# Patient Record
Sex: Male | Born: 1935 | Race: White | Hispanic: No | Marital: Married | Smoking: Former smoker
Health system: Southern US, Community
[De-identification: ages and names within clinical notes are randomized; demographics above are authoritative.]

## PROBLEM LIST (undated history)

## (undated) DIAGNOSIS — N401 Enlarged prostate with lower urinary tract symptoms: Secondary | ICD-10-CM

## (undated) DIAGNOSIS — L570 Actinic keratosis: Secondary | ICD-10-CM

## (undated) DIAGNOSIS — F329 Major depressive disorder, single episode, unspecified: Secondary | ICD-10-CM

## (undated) DIAGNOSIS — R7989 Other specified abnormal findings of blood chemistry: Secondary | ICD-10-CM

## (undated) DIAGNOSIS — I4949 Other premature depolarization: Secondary | ICD-10-CM

## (undated) DIAGNOSIS — F411 Generalized anxiety disorder: Secondary | ICD-10-CM

## (undated) DIAGNOSIS — M2550 Pain in unspecified joint: Secondary | ICD-10-CM

## (undated) DIAGNOSIS — G309 Alzheimer's disease, unspecified: Secondary | ICD-10-CM

## (undated) DIAGNOSIS — L8990 Pressure ulcer of unspecified site, unspecified stage: Secondary | ICD-10-CM

## (undated) DIAGNOSIS — I10 Essential (primary) hypertension: Secondary | ICD-10-CM

## (undated) DIAGNOSIS — Z87898 Personal history of other specified conditions: Secondary | ICD-10-CM

## (undated) DIAGNOSIS — N138 Other obstructive and reflux uropathy: Secondary | ICD-10-CM

## (undated) DIAGNOSIS — K602 Anal fissure, unspecified: Secondary | ICD-10-CM

## (undated) DIAGNOSIS — K648 Other hemorrhoids: Secondary | ICD-10-CM

## (undated) DIAGNOSIS — F0280 Dementia in other diseases classified elsewhere without behavioral disturbance: Secondary | ICD-10-CM

## (undated) DIAGNOSIS — M1990 Unspecified osteoarthritis, unspecified site: Secondary | ICD-10-CM

## (undated) DIAGNOSIS — F3289 Other specified depressive episodes: Secondary | ICD-10-CM

## (undated) DIAGNOSIS — K5730 Diverticulosis of large intestine without perforation or abscess without bleeding: Secondary | ICD-10-CM

## (undated) DIAGNOSIS — I2510 Atherosclerotic heart disease of native coronary artery without angina pectoris: Secondary | ICD-10-CM

## (undated) DIAGNOSIS — I679 Cerebrovascular disease, unspecified: Secondary | ICD-10-CM

## (undated) DIAGNOSIS — D126 Benign neoplasm of colon, unspecified: Secondary | ICD-10-CM

## (undated) DIAGNOSIS — K2100 Gastro-esophageal reflux disease with esophagitis, without bleeding: Secondary | ICD-10-CM

## (undated) DIAGNOSIS — E782 Mixed hyperlipidemia: Secondary | ICD-10-CM

## (undated) DIAGNOSIS — E559 Vitamin D deficiency, unspecified: Secondary | ICD-10-CM

## (undated) DIAGNOSIS — G4700 Insomnia, unspecified: Secondary | ICD-10-CM

## (undated) DIAGNOSIS — L408 Other psoriasis: Secondary | ICD-10-CM

## (undated) DIAGNOSIS — M25569 Pain in unspecified knee: Secondary | ICD-10-CM

## (undated) DIAGNOSIS — E039 Hypothyroidism, unspecified: Secondary | ICD-10-CM

## (undated) DIAGNOSIS — G609 Hereditary and idiopathic neuropathy, unspecified: Secondary | ICD-10-CM

## (undated) DIAGNOSIS — K644 Residual hemorrhoidal skin tags: Secondary | ICD-10-CM

## (undated) DIAGNOSIS — R002 Palpitations: Secondary | ICD-10-CM

## (undated) HISTORY — PX: RETINAL TEAR REPAIR CRYOTHERAPY: SHX5304

## (undated) HISTORY — DX: Other premature depolarization: I49.49

## (undated) HISTORY — DX: Generalized anxiety disorder: F41.1

## (undated) HISTORY — DX: Palpitations: R00.2

## (undated) HISTORY — DX: Insomnia, unspecified: G47.00

## (undated) HISTORY — DX: Gastro-esophageal reflux disease with esophagitis, without bleeding: K21.00

## (undated) HISTORY — DX: Personal history of other specified conditions: Z87.898

## (undated) HISTORY — DX: Diverticulosis of large intestine without perforation or abscess without bleeding: K57.30

## (undated) HISTORY — DX: Hypothyroidism, unspecified: E03.9

## (undated) HISTORY — DX: Cerebrovascular disease, unspecified: I67.9

## (undated) HISTORY — DX: Essential (primary) hypertension: I10

## (undated) HISTORY — DX: Other obstructive and reflux uropathy: N13.8

## (undated) HISTORY — DX: Residual hemorrhoidal skin tags: K64.4

## (undated) HISTORY — DX: Dementia in other diseases classified elsewhere, unspecified severity, without behavioral disturbance, psychotic disturbance, mood disturbance, and anxiety: F02.80

## (undated) HISTORY — DX: Gastro-esophageal reflux disease with esophagitis: K21.0

## (undated) HISTORY — DX: Unspecified osteoarthritis, unspecified site: M19.90

## (undated) HISTORY — DX: Other specified depressive episodes: F32.89

## (undated) HISTORY — PX: CERVICAL DISC SURGERY: SHX588

## (undated) HISTORY — DX: Benign prostatic hyperplasia with lower urinary tract symptoms: N40.1

## (undated) HISTORY — DX: Hereditary and idiopathic neuropathy, unspecified: G60.9

## (undated) HISTORY — DX: Vitamin D deficiency, unspecified: E55.9

## (undated) HISTORY — DX: Actinic keratosis: L57.0

## (undated) HISTORY — DX: Pressure ulcer of unspecified site, unspecified stage: L89.90

## (undated) HISTORY — DX: Mixed hyperlipidemia: E78.2

## (undated) HISTORY — DX: Anal fissure, unspecified: K60.2

## (undated) HISTORY — DX: Benign neoplasm of colon, unspecified: D12.6

## (undated) HISTORY — DX: Pain in unspecified joint: M25.50

## (undated) HISTORY — DX: Alzheimer's disease, unspecified: G30.9

## (undated) HISTORY — DX: Other specified abnormal findings of blood chemistry: R79.89

## (undated) HISTORY — DX: Major depressive disorder, single episode, unspecified: F32.9

## (undated) HISTORY — DX: Pain in unspecified knee: M25.569

## (undated) HISTORY — DX: Atherosclerotic heart disease of native coronary artery without angina pectoris: I25.10

## (undated) HISTORY — DX: Other hemorrhoids: K64.8

## (undated) HISTORY — DX: Other psoriasis: L40.8

---

## 1970-04-22 HISTORY — PX: OTHER SURGICAL HISTORY: SHX169

## 1997-11-01 ENCOUNTER — Other Ambulatory Visit: Admission: RE | Admit: 1997-11-01 | Discharge: 1997-11-01 | Payer: Self-pay | Admitting: Orthopedic Surgery

## 1999-10-07 ENCOUNTER — Ambulatory Visit (HOSPITAL_COMMUNITY): Admission: RE | Admit: 1999-10-07 | Discharge: 1999-10-07 | Payer: Self-pay | Admitting: Internal Medicine

## 1999-10-07 ENCOUNTER — Encounter: Payer: Self-pay | Admitting: Internal Medicine

## 2000-05-14 ENCOUNTER — Ambulatory Visit (HOSPITAL_COMMUNITY): Admission: RE | Admit: 2000-05-14 | Discharge: 2000-05-14 | Payer: Self-pay | Admitting: *Deleted

## 2000-08-21 ENCOUNTER — Ambulatory Visit (HOSPITAL_COMMUNITY): Admission: RE | Admit: 2000-08-21 | Discharge: 2000-08-21 | Payer: Self-pay | Admitting: Ophthalmology

## 2001-03-01 ENCOUNTER — Emergency Department (HOSPITAL_COMMUNITY): Admission: EM | Admit: 2001-03-01 | Discharge: 2001-03-01 | Payer: Self-pay | Admitting: Emergency Medicine

## 2005-01-09 ENCOUNTER — Ambulatory Visit: Payer: Self-pay | Admitting: Gastroenterology

## 2005-01-14 ENCOUNTER — Ambulatory Visit: Payer: Self-pay | Admitting: Gastroenterology

## 2005-01-14 ENCOUNTER — Encounter (INDEPENDENT_AMBULATORY_CARE_PROVIDER_SITE_OTHER): Payer: Self-pay | Admitting: *Deleted

## 2005-01-14 ENCOUNTER — Ambulatory Visit (HOSPITAL_COMMUNITY): Admission: RE | Admit: 2005-01-14 | Discharge: 2005-01-14 | Payer: Self-pay | Admitting: Gastroenterology

## 2005-05-02 ENCOUNTER — Ambulatory Visit (HOSPITAL_COMMUNITY): Admission: RE | Admit: 2005-05-02 | Discharge: 2005-05-02 | Payer: Self-pay | Admitting: Gastroenterology

## 2006-02-03 ENCOUNTER — Ambulatory Visit (HOSPITAL_COMMUNITY): Admission: RE | Admit: 2006-02-03 | Discharge: 2006-02-03 | Payer: Self-pay | Admitting: Internal Medicine

## 2006-05-14 ENCOUNTER — Ambulatory Visit (HOSPITAL_COMMUNITY): Admission: RE | Admit: 2006-05-14 | Discharge: 2006-05-14 | Payer: Self-pay | Admitting: Internal Medicine

## 2006-05-29 ENCOUNTER — Ambulatory Visit (HOSPITAL_COMMUNITY): Admission: RE | Admit: 2006-05-29 | Discharge: 2006-05-29 | Payer: Self-pay | Admitting: Gastroenterology

## 2006-07-18 ENCOUNTER — Inpatient Hospital Stay (HOSPITAL_COMMUNITY): Admission: RE | Admit: 2006-07-18 | Discharge: 2006-07-24 | Payer: Self-pay | Admitting: Orthopedic Surgery

## 2006-07-18 HISTORY — PX: KNEE SURGERY: SHX244

## 2006-07-21 ENCOUNTER — Ambulatory Visit: Payer: Self-pay | Admitting: Internal Medicine

## 2006-07-31 ENCOUNTER — Ambulatory Visit: Payer: Self-pay | Admitting: Pulmonary Disease

## 2006-07-31 ENCOUNTER — Ambulatory Visit: Payer: Self-pay | Admitting: Cardiovascular Disease

## 2006-07-31 LAB — CONVERTED CEMR LAB
BUN: 18 mg/dL (ref 6–23)
Basophils Relative: 0 % (ref 0.0–1.0)
CO2: 28 meq/L (ref 19–32)
Creatinine, Ser: 1.4 mg/dL (ref 0.4–1.5)
Eosinophils Relative: 1.9 % (ref 0.0–5.0)
GFR calc Af Amer: 65 mL/min
Glucose, Bld: 133 mg/dL — ABNORMAL HIGH (ref 70–99)
HCT: 31.7 % — ABNORMAL LOW (ref 39.0–52.0)
Hemoglobin: 11.1 g/dL — ABNORMAL LOW (ref 13.0–17.0)
INR: 1.3 (ref 0.9–2.0)
Lymphocytes Relative: 11.8 % — ABNORMAL LOW (ref 12.0–46.0)
Monocytes Absolute: 0.7 10*3/uL (ref 0.2–0.7)
Monocytes Relative: 6.3 % (ref 3.0–11.0)
Neutro Abs: 8.4 10*3/uL — ABNORMAL HIGH (ref 1.4–7.7)
Neutrophils Relative %: 80 % — ABNORMAL HIGH (ref 43.0–77.0)
Potassium: 3.5 meq/L (ref 3.5–5.1)
Prothrombin Time: 13.9 s (ref 10.0–14.0)
RDW: 12.4 % (ref 11.5–14.6)
WBC: 10.5 10*3/uL (ref 4.5–10.5)

## 2006-08-01 ENCOUNTER — Ambulatory Visit: Admission: RE | Admit: 2006-08-01 | Discharge: 2006-08-01 | Payer: Self-pay | Admitting: Pulmonary Disease

## 2006-08-01 ENCOUNTER — Ambulatory Visit: Payer: Self-pay | Admitting: Vascular Surgery

## 2006-08-01 ENCOUNTER — Encounter: Payer: Self-pay | Admitting: Vascular Surgery

## 2006-08-07 ENCOUNTER — Ambulatory Visit: Payer: Self-pay | Admitting: Cardiovascular Disease

## 2006-08-07 ENCOUNTER — Inpatient Hospital Stay (HOSPITAL_BASED_OUTPATIENT_CLINIC_OR_DEPARTMENT_OTHER): Admission: RE | Admit: 2006-08-07 | Discharge: 2006-08-07 | Payer: Self-pay | Admitting: Cardiovascular Disease

## 2006-08-14 ENCOUNTER — Ambulatory Visit: Payer: Self-pay | Admitting: Cardiovascular Disease

## 2006-08-27 ENCOUNTER — Ambulatory Visit: Payer: Self-pay | Admitting: Pulmonary Disease

## 2006-08-27 ENCOUNTER — Ambulatory Visit (HOSPITAL_COMMUNITY): Admission: RE | Admit: 2006-08-27 | Discharge: 2006-08-28 | Payer: Self-pay | Admitting: Orthopedic Surgery

## 2006-09-01 ENCOUNTER — Ambulatory Visit: Payer: Self-pay | Admitting: Pulmonary Disease

## 2007-02-24 ENCOUNTER — Ambulatory Visit: Payer: Self-pay | Admitting: Cardiovascular Disease

## 2007-05-25 ENCOUNTER — Ambulatory Visit: Payer: Self-pay | Admitting: Cardiovascular Disease

## 2007-12-29 ENCOUNTER — Ambulatory Visit: Payer: Self-pay | Admitting: Cardiovascular Disease

## 2007-12-29 ENCOUNTER — Ambulatory Visit: Payer: Self-pay

## 2008-06-22 DIAGNOSIS — N400 Enlarged prostate without lower urinary tract symptoms: Secondary | ICD-10-CM

## 2008-06-22 DIAGNOSIS — G309 Alzheimer's disease, unspecified: Secondary | ICD-10-CM

## 2008-06-22 DIAGNOSIS — F0280 Dementia in other diseases classified elsewhere without behavioral disturbance: Secondary | ICD-10-CM

## 2008-06-22 DIAGNOSIS — I2510 Atherosclerotic heart disease of native coronary artery without angina pectoris: Secondary | ICD-10-CM

## 2008-06-22 DIAGNOSIS — I10 Essential (primary) hypertension: Secondary | ICD-10-CM | POA: Insufficient documentation

## 2008-06-22 DIAGNOSIS — N138 Other obstructive and reflux uropathy: Secondary | ICD-10-CM

## 2008-06-22 HISTORY — DX: Benign prostatic hyperplasia with lower urinary tract symptoms: N13.8

## 2008-06-23 ENCOUNTER — Ambulatory Visit: Payer: Self-pay | Admitting: Cardiovascular Disease

## 2008-06-24 ENCOUNTER — Encounter: Admission: RE | Admit: 2008-06-24 | Discharge: 2008-06-24 | Payer: Self-pay | Admitting: Internal Medicine

## 2008-07-15 DIAGNOSIS — I25118 Atherosclerotic heart disease of native coronary artery with other forms of angina pectoris: Secondary | ICD-10-CM | POA: Insufficient documentation

## 2008-08-17 ENCOUNTER — Telehealth: Payer: Self-pay | Admitting: Cardiovascular Disease

## 2008-09-26 ENCOUNTER — Ambulatory Visit: Payer: Self-pay | Admitting: Cardiovascular Disease

## 2008-09-26 DIAGNOSIS — E782 Mixed hyperlipidemia: Secondary | ICD-10-CM

## 2008-11-08 LAB — HM COLONOSCOPY

## 2009-02-08 ENCOUNTER — Encounter (INDEPENDENT_AMBULATORY_CARE_PROVIDER_SITE_OTHER): Payer: Self-pay | Admitting: *Deleted

## 2009-03-31 ENCOUNTER — Ambulatory Visit: Payer: Self-pay | Admitting: Cardiovascular Disease

## 2010-04-02 ENCOUNTER — Encounter: Payer: Self-pay | Admitting: Cardiovascular Disease

## 2010-04-02 ENCOUNTER — Ambulatory Visit: Payer: Self-pay | Admitting: Cardiovascular Disease

## 2010-05-24 NOTE — Assessment & Plan Note (Signed)
Summary: yearly visit   CC:  sob..pt didnot bring med list.  History of Present Illness: Phillip Larson is seen today for followup of his blood pressure.  It seems to be better controlled on Hyzaar.  He has been more active.  He is watching the salt in his diet.  He also has hypercholesterolemia I dont' have recent labs on him which are usually checked by his primary.  He has CAD with a total RCA that is collaterized.  He had a low risk myovue in 2009 .  He is not having any chest pain PND or orthopnea.  He says Dr. Lenoria Larson. Phillip Larson is also notices blood pressure improved on Hyzaar. interestingly today his Alzheimer's disease also seems improved.  He was much more coherent and talkative.He is active and working out at [employer]  He denies SSCP, dyspnea, palpitaotns or syncope.  He sees Phillip Larson every 6 months.  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 Take 1 Tablet By Mouth Once A Day 2)  Flomax 0.4 Mg Xr24h-Cap (Tamsulosin Hcl) .[name].. 1 Tab By Mouth Once Daily 3)  Hydroxyzine Hcl 25 Mg Tabs (Hydroxyzine Hcl) .... 2 Tabs By Mouth Once Daily 4)  Levothyroxine Sodium 50 Mcg Tabs (Levothyroxine Sodium) .[name].. 1 Tab By Mouth Once Daily 5)  Lovastatin 40 Mg Tabs (Lovastatin) .... Take One Tablet By Mouth Daily At Bedtime 6)  Namenda 10 Mg Tabs (Memantine Hcl) .... 2 Tabs By Mouth Once Daily 7)  Razadyne Er 24 Mg Xr24h-Cap (Galantamine Hydrobromide) .[name].. 1 Tab By Mouth Once Daily 8)  Sertraline Hcl 50 Mg Tabs (Sertraline Hcl) .[name].. 1 Tab By Mouth Once Daily 9)  Xanax 0.5 Mg Tabs (Alprazolam) .... Tab By Mouth At Bedtime 10)  Prilosec 20 Mg Cpdr  (Omeprazole) .[name].. 1 Tab By Mouth Once Daily 11)  Multivitamins   Tabs (Multiple Vitamin) .... Tab By Mouth Once Daily 12)  Aspirin 81 Mg  Tabs (Aspirin) .... Tab By Mouth Once Daily 13)  Fish Oil   Oil (Fish Oil) .... Tab By Mouth Once Daily 14)  B Complex  Tabs (B Complex Vitamins) .[name].. 1 Tab By Mouth Once Daily 15)  Calcium 500 Mg Tabs (Calcium Carbonate) .[name].. 1 Tab By Mouth Once Daily 16)  Tylenol Pm Extra Strength 500-25 Mg Tabs (Diphenhydramine-Apap (Sleep)) .... As Needed  Allergies (verified): 1)  Iodine (Iodine)  Past History:  Past Medical History: Last updated: 06/22/2008 1. Hypertension.  2. CVA.  3. Osteoarthritis.  4. Peptic ulcer disease.  5. Hyperlipidemia.  6. Osteoarthritis left knee.   7. Hyperosmolality.    8. Cardiomegaly   9. Type 2 diabetes mellitus.   10. Alzheimer's.   11.Congestive heart failure.   Past Surgical History: Last updated: 06/22/2008 status post total  knee replacement.  Family History: Last updated: 06/22/2008 Remarkable for pancreatic cancer in both of his  parents.  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   Review of Systems       Denies fever, malais, weight loss, blurry vision, decreased visual acuity, cough, sputum, SOB, hemoptysis, pleuritic pain,  palpitaitons, heartburn, abdominal pain, melena, lower extremity edema, claudication, or rash.   Vital Signs:  Patient profile:   76 year old male Height:      71 inches Weight:      180 pounds BMI:     25.20 Pulse rate:   69 / minute Resp:     14 per minute BP sitting:   135 / 76  (left arm)  Vitals Entered By: Phillip Larson (April 02, 2010 9:20 AM)  Physical Exam  General:  Affect appropriate Healthy:  appears stated age HEENT: normal Neck supple with no adenopathy JVP normal no bruits no thyromegaly Lungs clear with no wheezing and good diaphragmatic motion Heart:  S1/S2  no murmur,rub, gallop or click PMI normal Abdomen: benighn, BS positve, no tenderness, no AAA no bruit.  No HSM or HJR Distal pulses intact with no bruits No edema Neuro non-focal Skin warm and dry    Impression & Recommendations:  Problem # 1:  MIXED HYPERLIPIDEMIA (ICD-272.2) Continue statin  Labs per Phillip Larson His updated medication list for this problem includes:    Lovastatin 40 Mg Tabs (Lovastatin) .[name]... Take one tablet by mouth daily at bedtime  Problem # 2:  ESSENTIAL HYPERTENSION, BENIGN (ICD-401.1) Well controlled His updated medication list for this problem includes:    Hyzaar 100-25 Mg Tabs (Losartan potassium-hctz) .[name]... Take 1 tablet by mouth once a day    Aspirin 81 Mg Tabs (Aspirin) .[name]... Tab by mouth once daily  Problem # 3:  CAD (ICD-414.00) Stable no anina  Avoid BB's given bradycardia.  ECG unchanged and reviewed separately His updated medication list for this problem includes:    Aspirin 81 Mg Tabs (Aspirin) .[name]... Tab by mouth once daily  Patient Instructions: 1)  Your physician wants you to follow-up in: ONE YEAR  You will receive a reminder letter in the mail two months in advance. If you don't receive a letter, please call our office to schedule the follow-up appointment.

## 2010-06-07 ENCOUNTER — Ambulatory Visit
Admission: RE | Admit: 2010-06-07 | Discharge: 2010-06-07 | Disposition: A | Discharge status: Home / Self Care | Payer: MEDICARE | Source: Ambulatory Visit | Attending: Internal Medicine | Admitting: Internal Medicine

## 2010-06-07 ENCOUNTER — Other Ambulatory Visit: Payer: Self-pay | Admitting: Internal Medicine

## 2010-06-07 DIAGNOSIS — R05 Cough: Secondary | ICD-10-CM

## 2010-07-10 ENCOUNTER — Other Ambulatory Visit: Payer: Self-pay | Admitting: Cardiovascular Disease

## 2010-07-13 ENCOUNTER — Other Ambulatory Visit: Payer: Self-pay | Admitting: *Deleted

## 2010-07-13 DIAGNOSIS — I10 Essential (primary) hypertension: Secondary | ICD-10-CM

## 2010-07-13 MED ORDER — LOSARTAN POTASSIUM-HCTZ 100-25 MG PO TABS: 1.0000 | ORAL_TABLET | Freq: Every day | ORAL | Status: DC

## 2010-08-15 ENCOUNTER — Telehealth: Payer: Self-pay | Admitting: Cardiovascular Disease

## 2010-08-15 NOTE — Telephone Encounter (Signed)
Walk In Pt Form received " PT dropped off Letter for Nishan Review" sent to Message Nurse 08/15/10/KM

## 2010-09-04 NOTE — Assessment & Plan Note (Signed)
[facility]                            CARDIOLOGY OFFICE NOTE   Phillip Larson, Phillip Larson                       MRN:          [id number]  DATE:05/25/2007                            DOB:          26-Jul-1934    Phillip Larson returns today for follow-up.  Initially saw him he had hypoxemia  after knee replacement.  It was somewhat of an enigma.  Believe he  probably had some cholesterol emboli.  He seems to have recovered.  We  did do heart cath on him.  He has 50% tubular lesion in the LAD.  He had  40-50% tubular disease in the circ.  He had an occluded right with good  collaterals.  EF was normal at 55-60%.  Right heart catheterization  showed normal.  PA pressures of 31/10.  Wedge pressure was only 5.  He  seems to be doing well.  He is more active with his knee.  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   He has not any significant cough, PND, orthopnea.  There is no lower  extremity edema.  He does notice some shortness of breath when he  crosses his arms in front of him.  This appears to be functional.   He is not having any significant chest pain.  I talked Phillip Larson and told  him he probably needs to have a stress test in the fall.  He will be  little bit difficult to follow the moderate disease in his left sided  vessels due to a collateralized right.   However, I would like to also measures saturations when he exercises.   His current medications include a lovastatin 40 a day, Namenda 20 a day,  ranitidine ER 24 mg a day, sertraline 50 mg a day, Xanax 0.5 q.h.s.,  Prilosec 20 a day, multivitamins.   He had previously been on Toprol but this was stopped.  I believe he had  some wheezing with it.  He is allergic to IVP DYE.   EXAM:  Remarkable for a jovial white male who looks younger than stated  age.  His weight is 187, blood pressure 120/80, pulse 52 and regular,  afebrile.  HEENT:  Unremarkable.  Carotids are without  bruit, no lymphadenopathy, lymphadenopathy,  thyromegaly, JVP elevation.  LUNGS:  Clear diaphragmatic was no wheezing.  S1, S2 with normal heart sounds.  PMI normal.  ABDOMEN:  Benign.  Bowel sounds positive.  No AAA No hepatosplenomegaly  or hepatojugular reflux.  Distal pulse intact, no edema.  Status post left knee replacement.  NEURO:  Nonfocal.  SKIN:  Warm and dry.  No muscular weakness.   IMPRESSION:  1. Coronary disease.  Total right coronary artery.  Follow-up Myoview      in September, continue aspirin.  No beta blocker due to relative      bradycardia.  2. Hypercholesterolemia setting of coronary disease.  Continue      lovastatin 40 a day, lipid and liver profile in 6 months.  3. Dyspnea functional previously no evidence of significant  cardiopulmonary disease.  Suspect limited cholesterol emboli after      knee replacement, currently stable.  Consider follow-up by PFTs to      assess restrictive lung disease.  If symptoms worsen.  4. Reflux.  Continue prosthetic 20 mg a day.  Avoid this spicy food      and late-night meals.   Overall I think that Phillip Larson is doing well and I will see him back in  September when he has a stress test.     [name], MD, [facility]  Electronically Signed    PCN/MedQ  DD: 05/25/2007  DT: 05/25/2007  Job #: [id number]

## 2010-09-04 NOTE — Assessment & Plan Note (Signed)
[facility]ALTHCARE                            CARDIOLOGY OFFICE NOTE   BASHAR, MILAM                       MRN:          [id number]  DATE:12/29/2007                            DOB:          1934/08/10    Florence returns today for followup.  He has coronary artery disease.  He  has moderate disease in the left system, but the total right  collateralized.  He had a stress test today.  He was able to excise for  7 minutes 58 seconds.  He had a hypertensive response.  He had some  dyspnea.  His images showed an inferior lateral wall infarct with mild  peri-infarct ischemia.  This would appear to be in the territory of his  collateralized right.  There was not any evidence of LV cavity  dilatation.  Overall EF was preserved at 53%.  In talking to him, he has  been active.  He walks 5 to 15 miles a week without significant chest  pain.  He continues to have what sounds like more mechanical dyspnea.  It particularly happens at night when he has his arms folded across his  chest.  He has had previous unexplained hypoxemia after his knee  replacement, which I thought it was probably some cholesterol emboli.  He is currently not actively wheezing or coughing.  He does not have  particular dyspnea with exertion and again it seems more positional.  His review of systems is, otherwise, negative.  He said Dr. Murray Hodgkins  has gave him a complete physical 30 days ago and his cholesterol was  okay and he has no new health problems.   He is allergic to IVP dye.   He is on lovastatin 40 a day, Namenda 20 a day, Razadyne 24 mg a day,  sertraline 50 mg a day, Xanax 0.5 a day, Prilosec 20 a day, Tylenol,  multivitamins, aspirin, Flomax 0.4 a day, hydrochlorothiazide/nadolol  40/25, and hydroxyzine.   PHYSICAL EXAMINATION:  GENERAL:  Remarkable for jovial white male, in no  distress.  VITAL SIGNS:  Blood pressure is 140/80, pulse 60 and regular,  respiratory 14 and  afebrile.  Weight is 180.  HEENT:  Unremarkable.  NECK:  Carotids are normal without bruit.  No lymphadenopathy ,  thyromegaly, or JVP elevation.  LUNGS:  Clear with good diaphragmatic motion.  No wheezing.  S1 and S2.  Normal heart sounds.  PMI normal.  ABDOMEN:  Benign.  Bowel sounds positive.  No AAA, no tenderness, no  bruit, no hepatosplenomegaly, no hepatojugular reflux, and no  tenderness.  Distal pulses are intact.  No edema.  NEUROLOGIC:  Nonfocal.  SKIN:  Warm and dry.  No muscular weakness.   His Myoview study was reviewed in detail images were gone over with the  patient.  His EKG at baseline is essentially normal.   He does have small Q waves in II, III, and F.   IMPRESSION:  1. Coronary artery disease, collateralized right moderate left-sided      disease.  No symptoms and no evidence of ischemia in the left side  on Myoview.  Continue aspirin and beta-blocker.  2. Hypercholesterolemia.  Continue statin drug.  We will try to get      records from Dr. Chilton Si regarding his LDL and LFTs.  He currently      not having myalgias.  3. Prostatism.  Continue Flomax.  Urinary stream, reasonable check PSA      per Dr. Chilton Si.  4. Anxiety, depression, and dementia on multiple medications.  The      patient seems quite stable to me.  He is jovial.  His memory seems      intact.  I am actually little bit surprised at the amount of      psychotropic medication that he is on.  Follow up with Dr. Murray Hodgkins for this.  5. Hypertension seems to be well controlled at rest.  He did have a      hypertensive response for the time being, we would continue nadolol      and HCTZ in addition of a low-salt diet.   I will see him back in 6 months.     Noralyn Pick. Eden Emms, MD, [facility]  Electronically Signed    PCN/MedQ  DD: 12/29/2007  DT: 12/30/2007  Job #: [id number]

## 2010-09-04 NOTE — Op Note (Signed)
NAME:  Phillip Larson, Phillip Larson NO.:  [id number]   MEDICAL RECORD NO.:  [id number]          PATIENT TYPE:  OIB   LOCATION:  4707                         FACILITY:  [facility]   PHYSICIAN:  Dyke Brackett, M.D.    DATE OF BIRTH:  Sep 12, 1934   DATE OF PROCEDURE:  08/27/2006  DATE OF DISCHARGE:                               OPERATIVE REPORT   PREOPERATIVE DIAGNOSIS:  Arthrofibrosis, left knee, status post total  knee replacement.   POSTOPERATIVE DIAGNOSIS:  Arthrofibrosis, left knee, status post total  knee replacement.   OPERATION:  Manipulation under anesthesia.   SURGEON:  Dyke Brackett, M.D.   ASSISTANTChestine Spore, P.A.   ANESTHETIC:  General with a femoral nerve block.   INDICATIONS:  A 76 year old with arthrofibrosis, status post knee  replacement, not responding to conservative treatment in the outpatient  PT setting, thought to be amenable to overnight hospitalization for  medical monitoring.   DESCRIPTION OF PROCEDURE:  Preop, probably range of motion was 15 to 70;  post manipulation, 0 to 120.  Noticeable audible crepitus was obtained,  with no evidence of any fracture or patellar mechanism disruption.  He  was taken to the recovery room in stable condition.      Dyke Brackett, M.D.     WDC/MEDQ  D:  08/27/2006  T:  08/27/2006  Job:  [id number]

## 2010-09-04 NOTE — Assessment & Plan Note (Signed)
[facility]ALTHC[facility]                            CARDIOLOGY OFFICE NOTE   Phillip Larson, Phillip Larson                       MRN:          [id number]  DATE:06/23/2008                            DOB:          Oct 06, 1934    Phillip Larson returns today for followup.  He has a history of coronary artery  disease with total RCA with collaterals.  He had the last Myoview on  December 29, 2007, which showed preserved EF with inferior posterior  scar, mild peri-infarct ischemia.  We treated medically.  Since he has  known collateralization of the RCA, he has done well.  He is not having  any significant chest pain.  Unfortunately, his Alzheimer disease  continues to progress.  He has a very bad memory.  He has been on  Namenda, sertraline, and apparently Dr. Lolly Mustache has tried him on some new  medication, which he cannot remember the name of.  From a cardiac  perspective, he is stable.  His heart rate has been low.  We had stopped  his Toprol, but it appears that he was put on nadolol.   I told him I would like him off all beta-blockers, AV nodal blocking  drugs since his blood pressure tends to run high.  We will start him on  Hyzaar in view of the fact that his nadolol was usually given in a  combination pill with his diuretic.   His review of systems otherwise negative.  He is not having chest pain,  PND, or orthopnea.  No syncope or palpitations.  Memory is poor.   CURRENT MEDICATIONS:  1. Lovastatin 40 a day.  2. Namenda 10 a day.  3. Razadyne 25 a day.  4. Sertraline 50 a day.  5. Xanax 0.5 a day.  6. Prilosec 20 a day.  7. Tylenol.  8. Multivitamins.  9. Aspirin a day.  10.Fish oil.  11.Multiple vitamins.  12.Hydroxyzine 50 a day.  13.Flomax 0.4 day.   PHYSICAL EXAMINATION:  GENERAL:  Remarkable for elderly male in no  distress.  VITAL SIGNS:  His pulse is 49, blood pressure is 160/88, respiratory  rate 14, afebrile.  Memory extremely poor 0/3 recall on person,  place,  and time.  HEENT:  Unremarkable.  NECK:  Carotids are normal without bruit.  No lymphadenopathy,  thyromegaly, or JVP elevation.  LUNGS:  Clear.  Good diaphragmatic motion.  No wheezing.  S1, S2, normal  heart sounds.  PMI normal.  ABDOMEN:  Benign.  Bowel sounds positive.  No AAA, no tenderness, no  bruit, no hepatosplenomegaly, no hepatojugular reflux, or tenderness.  EXTREMITIES:  Distal pulses are intact.  No edema.  NEURO:  Nonfocal.  SKIN:  Warm and dry.  MUSCULOSKELETAL:  No muscular weakness.   EKG shows sinus bradycardia.  No acute changes, left axis deviation, and  possible old inferior wall infarct.   IMPRESSION:  1. Coronary artery disease, collateralized right coronary artery low-      risk Myoview September 2009.  No angina.  Continue current dose of      aspirin, beta-blocker being stopped due  to excessive bradycardia.  2. Hypertension.  Stop Corzide, start Hyzaar 100/25.  Follow up in 8      weeks to reassess blood pressure.  3. Significant Alzheimer disease.  Follow with Dr. Lolly Mustache.  Memory is      very poor at this point.  He is on multiple medications, but      continues to struggle.  4. History of prostatism.  Continue Flomax.  Urinary stream is good.      PSA per primary doctor.     Phillip Larson. Phillip Emms, MD, [facility]  Electronically Signed    PCN/MedQ  DD: 06/23/2008  DT: 06/23/2008  Job #: [id number]

## 2010-09-04 NOTE — Assessment & Plan Note (Signed)
[facility] HEALTHCARE                            CARDIOLOGY OFFICE NOTE   Phillip Larson, Phillip Larson                       MRN:          [id number]  DATE:02/24/2007                            DOB:          03-Dec-1934    [name] is seen today in followup. He has moderate coronary artery  disease with a total right that has collateralized. His last cath was in  April of 2008. We initially did this because he had post operative  hypoxemia. The patient had left knee surgery by Dr. Madelon Lips I believe in  March. He subsequently had some desaturations and atelectasis. He had  essentially a negative workup for PE with moderate coronary disease. No  step up on his right heart cath and no pulmonary hypertension. In  talking to [facility], he continues to get exertional dyspnea. However, this  appears stable. There is no active wheezing. No lower extremity edema.  No chest pain. The patient actually describes more of an uncomfortable  feeling when he crosses his arms while sitting. I suspect that this is  more postural than anything else. He is much more active now. His  swelling in his left knee has improved. He actually was running a little  bit with his grandchildren in [place] the last few weeks. His review  of systems is otherwise negative.   He has been enjoying going to the [employer] with his children and  grandchildren.   NOTE SHOULD BE MADE THAT HE IS ALLERGIC TO IVP DYE.   MEDICATIONS:  1. Hydrochlorothiazide 25.  2. Nadolol  40.  3. Lovastatin 40 a day.  4. Namenda 20 a day.  5. Sertraline 50 a day.  6. Xanax 0.5 nightly.  7. Prilosec 20 a day.  8. Multivitamins.  9. Fish oil.   PHYSICAL EXAMINATION:  Remarkable for an elderly white male who looks  younger than his stated age. Affect is appropriate. Weight is 175. Blood  pressure is 128/72. Pulse 80 and regular. Sats are 98% on room air.  Respiratory rate is 14.  HEENT: Is unremarkable.  NECK: Supple.  There is no JVP elevation. No thyromegaly. No  lymphadenopathy.  LUNGS:  Are clear with good diaphragmatic motion. No wheezing.  S1, S2 with normal heart sounds. PMI normal.  ABDOMEN: Is benign. Bowel sounds positive. No tenderness. No AAA. No  hepatosplenomegaly or hepatojugular reflux. No bruits.  Femorals are +3 bilaterally without bruits. PT's +3. No lower extremity  edema.  He is status post left knee replacement with good quadriceps  tone and no swelling.  NEURO: Is nonfocal.  SKIN: Is warm and dry. No other muscular weakness.   IMPRESSION:  1. Stable moderate coronary artery disease on the left with      collateralized right. No angina. Followup Myoview probably next      fall. Continue beta-blocker and aspirin.  2. Dyspnea, now functional. No evidence of pulmonary embolism or      significant cardiopulmonary disease. No shunt or pulmonary      hypertension on right heart cath. Continue exercise program and try  to keep weight down.  3. Hypercholesterolemia. Followup lipid and liver profile in six      months. This may be checked by Dr. Chilton Si. Continue lovastatin.  4. Osteoarthritis with recent left knee replacement. Followup with Dr.      Madelon Lips. Continue rehab. P.r.n. Tylenol for pain.   Overall, I think Phillip Larson is improving and I will see him back to follow  his coronary disease in the spring.     Noralyn Pick. Eden Emms, MD, [facility]  Electronically Signed    PCN/MedQ  DD: 02/24/2007  DT: 02/24/2007  Job #: [id number]

## 2010-09-07 NOTE — Procedures (Signed)
[facility]  Patient:    Phillip Larson, Phillip Larson                         MRN: [id number] Proc. Date: 05/14/00 Attending:  Sabino Gasser, M.D.                           Procedure Report  PROCEDURE:  Colonoscopy.  INDICATION FOR PROCEDURE:  Polyps, heme positive stool.  ANESTHESIA:  Demerol 90 mg, Versed 9 mg.  DESCRIPTION OF PROCEDURE:  With the patient mildly sedated in the left lateral decubitus position, the Olympus videoscopic colonoscope was inserted in the rectum after a normal rectal exam and passed under direct vision to the cecum identified by the ileocecal valve and base of the cecum. From this point, the colonoscope was slowly withdrawn taking circumferential views of the entire colonic mucosa, stopping only in the rectum which appeared normal in direct view and showed internal hemorrhoids on retroflexed view. The endoscope was straightened and withdrawn. The patients vital signs and pulse oximeter remained stable. The patient tolerated the procedure well without apparent complications.  FINDINGS:  Internal hemorrhoids otherwise unremarkable colonoscopic examination.  PLAN:  Followup with me as needed. DD:  05/14/00 TD:  05/14/00 Job: [id number]

## 2010-09-07 NOTE — Op Note (Signed)
NAME:  Phillip Larson, Phillip Larson NO.:  [id number]   MEDICAL RECORD NO.:  [id number]          PATIENT TYPE:  INP   LOCATION:  5040                         FACILITY:  [facility]   PHYSICIAN:  Dyke Brackett, M.D.    DATE OF BIRTH:  1934/09/14   DATE OF PROCEDURE:  07/18/2006  DATE OF DISCHARGE:                               OPERATIVE REPORT   PREOPERATIVE DIAGNOSIS:  Osteoarthritis, left knee, with varus  deformity.   POSTOPERATIVE DIAGNOSIS:  Osteoarthritis, left knee, with varus  deformity.   OPERATION:  Left total knee replacement through the LCS submitted,  standard plus with size #4 tibia, 12.5 mm bearing and standard plus 3-  peg patella.   SURGEON:  Dyke Brackett, M.D.   ASSISTANT:  Lolita Cram, P.A.   TOURNIQUET TIME:  Approximately 1 hour 10 minutes.   DESCRIPTION OF PROCEDURE:  The patient was sterilely prepped and draped,  in the supine position.  The leg was exsanguinated and the tourniquet  inflated at 350 mmHg.  The straight skin incision made with the medial  parapatellar approach to the knee made revealing an arthritic knee with  some varus deformity and significant wear of the medial compartment.  A  cut was made 2.5 mm below the most diseased medial compartment of the  tibia with a 7 degree posterior slope followed by an anterior-posterior  cut to get a placing gap of 12.5 mm eventually equaling the extension  graft at 12.5 mm.  A 4 degree valgus cut was made, followed by chamfer  cut and finishing guide cuts.  The CCL was released, excess menisci were  removed.   Attention was next directed at the tibia which a keel hole was made for  the tibia with the tibial trial placed with a 12.5 mm bearing.  The  patella is cut leaving about 14 to 15 mm of native patella.  Three peg  patellar trial placed.  Full extension was noted, no instability,  slightly positive drawer test with good stability of the area.  No  valgus or varus instability was noted.   Components were removed.  Bony  surfaces were irrigated and the final components were inserted in the  tibia followed by femur and patella with 1.2 grams of tobramycin per  batch of cement, for a total of 2 batches of cement.  Extra cement was  removed.  The trial bearing was placed before the final bearing.  This  allowed the back of the knee to be checked for bleeding as well as for  excess cement after it had been allowed to harden and the tourniquet was  released before closure.  No excessive bleeding was noted.  Small  bleeders were coagulated.  The closure was effected after Hemovac was  placed superolaterally with #1 Ethibond, 2-0 Vicryl and skin clips,  sterile dressing applied and taken to the recovery room in stable  condition.      Dyke Brackett, M.D.  Electronically Signed    WDC/MEDQ  D:  07/18/2006  T:  07/18/2006  Job:  [id number]

## 2010-09-07 NOTE — Assessment & Plan Note (Signed)
[name]                             PULMONARY OFFICE NOTE   Phillip Larson, Phillip Larson                    MRN:          [id number]  DATE:07/31/2006                            DOB:          [birth year]    HISTORY OF PRESENT ILLNESS:  Patient is a very pleasant 76 year old  gentleman who I have been asked to see for hypoxemia. The patient  approximately 2 weeks ago underwent knee replacement and postoperatively  began to develop hypoxemia. Patient underwent a spiral CT that showed no  evidence for a pulmonary embolus but did show calcification in his  coronary arteries and in his aorta. The patient had an echocardiogram  done on March 31 which showed a normal ejection fraction but there was  evidence for a dilated left atrium and also Doppler evidence for  increased LV filling pressures. His brain natriuretic peptide was also  at 579 during that admission. Patient did have some improvements and by  discharge his O2 levels were better and he was not requiring oxygen.  Since being at home he has had a lot of problems with fatigue and  dizziness. Shortness of breath has not been a significant issue for him  after walking but again he does get very fatigued and drained from an  energy standpoint. He denies any cough or mucus production. He has had  no chest pain or pressure. The patient has had his O2 saturations  checked by physical therapy and apparently they have been low. He was  also checked recently at [facility] cardiology and they were decreased  there as well. Prior to hospitalization patient had some mild dyspnea on  exertion which much heavier exertion. He did have a longstanding history  of tobacco abuse. He has not had recent pulmonary functions studies.   PAST MEDICAL HISTORY:  Significant for;  1. Hypertension.  2. Dyslipidemia.  3. History of small vessel stroke in December of 2002.  4. Status post laminectomy in 1971.   CURRENT  MEDICATIONS:  Include;  1. Lopressor of unknown dose.  2. Lovastatin 40 mg daily.  3. Namenda 10 mg daily.  4. Razadyne ER 24 mg daily.  5. Sertraline 50 mg daily.  6. Xanax 0.5 mg daily.  7. Prilosec 20 mg daily.  8. Also a lot of over-the-counter preparations.   PATIENT IS INTOLERANT TO IODINE VERSUS ALLERGY.   SOCIAL HISTORY:  The patient is married and has children. 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 He has a history of smoking 2 packs  per day for many, many, many years but has not smoked since 1994.   FAMILY HISTORY:  Remarkable for pancreatic cancer in both of his  parents.   REVIEW OF SYSTEMS:  As per history of present illness also see patient  intake form, documented in the chart.   PHYSICAL EXAMINATION:  IN GENERAL: He is a well-developed white male in  no acute distress. Blood pressure is 100/62, pulse 55, temperature is  98.1, weight 174 pounds, O2 saturation on room air is 96%.  HEENT: Pupils equal, round, and reactive to  light, and accommodation.  Extraocular movements are intact. Nares patent without discharge.  Oropharynx  is clear.  NECK: Supple without JVD or lymphadenopathy. There is no palpable  thyromegaly.  CHEST: Totally clear to auscultation.  CARDIAC EXAM: Reveals regular rate and rhythm.  ABDOMEN: Soft, nontender with good bowel sounds.  GENITAL: RECTAL EXAM, BREAST EXAM: Not done and not indicated.  LOWER EXTREMITIES: Surprisingly with no edema, pulses are intact  distally.  NEUROLOGICALLY: Alert and oriented, moves all 4 extremities.   LABORATORY DATA:  The patient did have exertional sats done. His lowest  O2 saturation with walking was 95% but the nurse did note color change  and increased shortness of breath with dizziness.   IMPRESSION:  Hypoxemia of unknown etiology. Currently the patient has  normal sats both at rest and with exertion. He has had difficulties with  exercise hypoxemia recently. There was no evidence of a  pulmonary  embolus on his CT scan while in the hospital. Certainly spiral CT s can  miss very, very small peripheral clots, however, you would not expect to  see that much O2 desaturation with this. My experience has been those  that have small peripheral clots have more pleuritic chest pain and  sensation of dyspnea than true hypoxemia. Patient's history is really  not consistent with this. His history also is not consistent with fat  emboli syndrome, and unfortunately there is no way at this point in time  of making this diagnosis. Fat emboli syndrome is usually extremely  unusual in knee replacement surgery. There have been case reports in hip  replacement as the prosthesis was being hammered into the femoral neck.  At this point in time I think it is very unlikely this is an issue for  him. One thing that I would consider in the pulmonary realm is whether  or not he could be having intrapulmonary or intracardiac shunting. It  may be worth doing some type of bubble study to see if this is the case.  Finally the patient is being worked up by Dr. Eden Emms for possible  cardiac etiologies for his symptoms. He did have evidence for left  ventricular overload and an enlarged left atrium, as well as an  increased brain natriuretic peptid. I have had a long discussion with  both he and his wife and I have the sneaky suspicion that more than  likely this will resolve on its own and we will find no particular  etiology for this.   PLAN:  1. Schedule for transcranial Doppler bubble study next week.  2. Lower extremity venous Doppler for completeness to make sure the      patient does not have ongoing clot.  3. The patient is to have what sounds like right and left heart      catheterization next week by      cardiology.  4. The patient will follow up after the above.     Barbaraann Share, MD,FCCP  Electronically Signed    KMC/MedQ  DD: 07/31/2006  DT: 07/31/2006  Job #: [id number]  cc:    Noralyn Pick. Eden Emms, MD, [facility]

## 2010-09-07 NOTE — Consult Note (Signed)
NAME:  Phillip Larson, Phillip Larson NO.:  [id number]   MEDICAL RECORD NO.:  [id number]          PATIENT TYPE:  INP   LOCATION:  3728                         FACILITY:  [facility]   PHYSICIAN:  Hettie Holstein, D.O.    DATE OF BIRTH:  12/07/1934   DATE OF CONSULTATION:  07/20/2006  DATE OF DISCHARGE:                                 CONSULTATION   CONSULTATION:   PRIMARY CARE PHYSICIAN:  Dr. Frederik Pear.   REASON FOR CONSULTATION:  Hypoxia and anticipated plan for CT scan of  the left pulmonary embolus, though DYE ALLERGY.   HISTORY OF PRESENT ILLNESS:  Phillip Larson is a very pleasant, 76 year old,  independently living male who presented for elective total knee  arthroplasty due to osteoarthritis in the left knee and underwent total  knee arthroplasty on July 18, 2006.  Intraoperative course was  reviewed.  He received a total of 2200 mL in with urine output of 300,  EBL of 250.  There was only a brief episode of low blood pressure which  resolved.  In any event, we were called by Dr. Candise Bowens service because  of hypoxia.  He had an ABG performed also that revealed an O2 of 55.  Phillip Larson has no previous history of lung disease.  In fact, he had  spirometry performed under the direction of Dr. Jetty Duhamel on May 14, 2006 that was negative.  He underwent preoperative evaluation by his  primary care physician, Dr. Frederik Pear.  Apparently had a normal EKG at  the time.  I do not have this available in the medical records and he  quit smoking tobacco in 1994.   ALLERGIES:  HE DOES HAVE A HISTORY OF REACTION TO IODINE AND CARRIES A  DIAGNOSIS OF AN IV DYE ALLERGY.   PAST MEDICAL HISTORY:  His past medical history is significant for:  1. Laminectomy in 1971.  2. Tobacco abuse, quit in 1994.  3. Status post blepharectomy due to visual obstruction.  4. A past history of questionable CVA/TIA five years ago.  Further      details of this are not available.  5. History of eczema.  6. History of IV dye allergy as noted above.  7. He has a thickening of his pyloric mucosa per some imaging studies      that have been followed serially.  He states that he does not have      problems with this and this has been well-controlled and well-      followed.   MEDICATIONS:  He takes:  1. HCTZ 25/nadolol 40 daily.  2. Hydroxyzine 25 b.i.d.  3. Lovastatin 40 mg q.24h.  4. Namenda 10 mg b.i.d.  5. Trazodone ER he takes q.24h.  6. Zoloft 50 mg daily.  7. Xanax 0.5 mg p.o. q.h.s.  8. Prilosec as well as Tylenol Arthritis, multivitamins and aspirin 81      mg daily.   SOCIAL HISTORY:  He quit smoking in 1994.  He continues to work in  Psychologist, clinical.  He has been limited with regards to his mobility due  to  his left knee.  He drinks about three beers per day and drinks some gin  on the weekends.  No prior history of DTs.  He is married.   FAMILY HISTORY:  Both of his parents lived into their late eighties and  died with pancreatic cancer, although had no diseases known prior to the  age of 51 to 70 according to the family.   REVIEW OF SYSTEMS:  Phillip Larson had been in his usual state of health.  He  does have generalized and chronic dyspnea with minimal exertion  according to him at home.  They have not addressed this previously.  No  known fevers.  He has a fever here in the hospital of 101.8, but no  other complaints.  The family states he has been having some short-term  memory problems which is why he has been started on Namenda.  In any  event, further review is unremarkable with the exception of some cold  extremities.   PHYSICAL EXAMINATION:  VITAL SIGNS:  His temperature was 101.8.  Blood  pressure 104/59.  Heart rate 80.  Respirations 20.  Temperature is  98.8..  His I and O's for today reveal an input of 3.010 liters in and  1.775 liters out and 1445 today over 760 out.  Oxygenation on room air  85%, correcting to 95% with two liters.  He is on continuous pulse   oximetry.  GENERAL:  The patient is awake, alert and in no acute distress.  HEENT:  Revealed head to be normocephalic, atraumatic.  Extraocular  muscles are intact.  Neck is supple, nontender.  CARDIOVASCULAR:  Revealed normal S1, S2.  No appreciable JVD.  He did  have a soft left sternal border holosystolic murmur radiating to the  left axilla.  RESPIRATORY:  He exhibited normal effort.  He did have rales at the  bases.  He exhibited normal effort.  ABDOMEN:  Soft, nontender, obese.  EXTREMITIES:  Revealed no edema.  They were cool.  There were palpable  pulses bilaterally.   LABORATORY DATA:  Laboratory data revealed his sodium to be 130,  potassium 4.1, BUN 17, creatinine 1.0, glucose 150, calcium 8.3.  WBC is  7.3, hemoglobin 10.2, platelet count 168,000, MCV of 91.3.  Chest x-ray  revealed cardiomegaly with bibasilar atelectasis.  Blood gas revealed a  pH of 7.45, PCO2 of 39 and PO2 of 55, bicarb of 27 and O2 saturation of  89% on room air.  EKG is not available at this time.  I will request one  now.   ASSESSMENT:  1. Hypoxia, unclear etiology.  He does have some rales on his clinical      examination.  Perhaps he is volume overloaded with some      cardiomegaly not evaluated previously.  2. Cardiomegaly.  No prior known cardiomyopathy.  We will check an EKG      and a 2D echocardiogram.  3. Postoperative day #2, left total knee replacement.  4. Routine alcohol at home.  We will watch for delirium tremens and      empirically supplement with thiamine p.o. and recommended follow-up      for symptoms of withdrawal.  5. Intravenous dye allergy.  6. Hyponatremia.  Will discontinue hydrochlorothiazide at this time.  7. Fever of unclear etiology.   PLAN:  At this time, we will initially check a D-dimer.  If this is  negative, we will forego a CT.  Otherwise we would proceed with  empiric full-dose anticoagulation and prep for a CT scan in the morning.  We  will check a BMP as  well to assist in assessment of his volume status  and order a 2D echocardiogram for evaluation of LV function.  We will  check an EKG.  We will continue to follow with you.      Hettie Holstein, D.O.  Electronically Signed     ESS/MEDQ  D:  07/20/2006  T:  07/21/2006  Job:  [id number]   cc:   Lenon Curt. Chilton Si, M.D.

## 2010-09-07 NOTE — Op Note (Signed)
[place]  Patient:    Phillip Larson, Phillip Larson                    MRN: [id number] Proc. Date: 08/21/00 Adm. Date:  [id number] Disc. Date: [id number] Attending:  Tommy Medal CC:         Lenon Curt. Cassell Clement, M.D.   Operative Report  PREOPERATIVE DIAGNOSIS:  Severe blepharochalasis with visual impairment.  POSTOPERATIVE DIAGNOSIS:  Severe blepharochalasis with visual impairment.  PROCEDURE:  Upper eyelid optical blepharoplasty.  SURGEON:  Robert L. Dione Booze, M.D.  ANESTHESIA:  1% Xylocaine with epinephrine.  INDICATIONS AND JUSTIFICATIONS FOR THE PROCEDURE:  [name] was first seen in my office on February 18, 2001, for a full examination and could be refracted to 20/20.  He was seen by Dr. Jonny Ruiz _____.  He was seen again on March 25, 2000, and still could be refracted to 20/20.  He did have moderate optic nerve cupping that was suggestive of glaucoma and has had testing regarding this.  On June 30, 2000, he was found to have significant superior visual field loss when a visual field test was taken for the glaucoma.  In addition to that, the blepharochalasis had become worse, and he did notice that he had a superior reduction of the field and he could feel the weight of the skin on his upper lids.  This bothered him significantly, and he decided to have upper eyelid optical blepharoplasty in order to improve his superior field of vision.  Photographs were taken to document the condition, and special extra field testing was performed, and that shows approximately a 20 degree loss of the superior field, or actually almost a 30 degree loss of the superior field caused by the redundant skin.  Medically he is stable and should be able to tolerate a simple local optical eyelid blepharoplasty.  He is followed medically by Lenon Curt. Cassell Clement, M.D.  The reason for the procedure is to open the upper field.  JUSTIFICATION FOR PERFORMING THE  PROCEDURE IN AN OUTPATIENT SETTING:  Routine.  JUSTIFICATION FOR OVERNIGHT STAY:  None.  DESCRIPTION OF PROCEDURE:  The patient arrived in the minor surgery room at [facility] and was prepped and draped in the routine fashion.  The skin to be removed was carefully demarcated and then excised.  Some underlying fatty tissue was also excised.  Each upper eyelid had been blocked with a frontal lid block, and the face had been prepped and draped.  After the skin and the fatty tissue had been excised, the bleeding was controlled with pressure.  Next, each wound was closed with a running 6-0 nylon suture, and pressure patches were applied.  The patient then left the minor surgery room having done well.  FOLLOW-UP CARE:  The patient to be seen in my office in four days to have the sutures removed.  He is to remove the patches from his eyes about 5 oclock this afternoon and is to begin warm compresses several times a day. DD:  08/21/00 TD:  08/22/00 Job: [id number]

## 2010-09-07 NOTE — Discharge Summary (Signed)
NAME:  Phillip Larson, VANDERZEE NO.:  [id number]   MEDICAL RECORD NO.:  [id number]          PATIENT TYPE:  INP   LOCATION:  3728                         FACILITY:  [facility]   PHYSICIAN:  Dyke Brackett, M.D.    DATE OF BIRTH:  1934/06/17   DATE OF ADMISSION:  07/18/2006  DATE OF DISCHARGE:  07/24/2006                               DISCHARGE SUMMARY   ADMISSION DIAGNOSIS:  Osteoarthritis left knee.   DISCHARGE DIAGNOSES:  1. Osteoarthritis left knee.  2. Hyperosmolality.  3. Post hemorrhagic anemia.  4. Hypertension.  5. Peptic ulcer disease.  6. Cardiomegaly.  7. Hyperlipidemia.  8. Type 2 diabetes mellitus.  9. Alzheimer's.  10.Congestive heart failure.   PROCEDURE:  Left total knee arthroplasty.   HISTORY:  Mr. Hansson is a 76 year old, white male with persistent left  knee pain for several years.  Pain has now worsened and is constant ache  cause by osteoarthritis left knee.  He has failed conservative treatment  at this time.  He is indicated now for a left total knee arthroplasty.   HOSPITAL COURSE:  This 76 year old male admitted July 18, 2006 after  appropriate laboratory studies were obtained, as well as 1 gram Ancef IV  on call to the operating room, was taken to the operating room where he  underwent a left total knee arthroplasty by Dr. Kristeen Miss assisted by  Rexene Edison, PA-C.  He tolerated procedure well.  He was continued on  Ancef 1 gram IV q.8 h. x3 doses.  Begun on Lovenox 30 mg subcutaneous  q.12 h. starting at 8 a.m. on July 19, 2006.  Foley was placed  intraoperatively.  Hemovac was placed intraoperatively.  Consultation  with PT, OT and care management were made.  PT for ambulation and  weightbearing as tolerated.  Placed on PCA reduced dose Dilaudid pump  for pain management.  He was allowed out of bed to chair the following  day.  He did suffer from hypokalemia and this was treated with 20 mEq of  potassium added to each bag IV fluids and  K-Dur 20 mEq p.o. t.i.d.  He  was weaned off of his oxygenime.  A 2-D echo was completed on the July 21, 2006.  Once the medicine and cardiology evaluation was performed and  he improved, then his physical therapy was then restarted.  He also had  an episode of delirium which also began to improve by July 22, 2006.  Once medicine felt that he was a candidate for discharge, he was  discharged on July 24, 2006, to return back to the office in followup.  Echocardiogram revealed left ventricular size is upper limit.  Overall  left ventricular systolic function was normal.  Left ventricular  ejection fraction estimated at 60%.  Aortic valve thickness was mildly  increased.  Aortic root was at the upper limits of normal size.  Mild  mitral annular calcification.  Left atrium was dilated.  There was a  left pleural effusion.  EKG from July 20, 2006 revealed normal sinus  rhythm, inferior infarct, age undetermined.  Anterior  infarct, age  undetermined, with nonspecific ST and T changes.  On July 21, 2006,  reveals normal sinus rhythm, minimal voltage criteria for LVH which may  be normal variant.  Inferior infarct age to be determined.  On July 23, 2006, revealed sinus bradycardia, inferior infarct, age undetermined.  Possible anterior infarct, age undetermined.  On July 24, 2006, reveals  a sinus bradycardia with minimal voltage criteria for LVH which may be  normal variant.  Inferior infarct, age undetermined.  Possible anterior  infarct, age undetermined.   RADIOGRAPHIC STUDIES:  Chest x-ray reveals bibasilar atelectasis on  July 20, 2006.  CTA reveals negative for acute PE.  Coronary and aortic  calcifications.  Subsegmental scarring atelectasis in the lateral base  segment left lower lobe.   LABORATORY DATA:  A pH 7.446, pCO2 of 39.9, pO2 of 55.5, bicarb 27.1,  __________ 28.3, base 3.2, O2 sats 89.6.  Preop hemoglobin 14.4,  hematocrit 41.8%, white count 6400, platelets 230,000.   Discharge  hemoglobin time 9.2, hematocrit 27.3%, white count 7200, platelets  291,000.  D-dimer 0.94.  Chemistries preop sodium 138, potassium 4,  chloride 103, CO2 of 29, glucose 99, BUN 13, creatinine 1.18.  GFR is  greater than 60.  Total protein 6.1, albumin 3.8, AST 26, ALT 22, ALP  70, total bilirubin 1.3.  Discharge chemistries:  Sodium 131, potassium  4.6, chloride 96, CO2 of 29, glucose 116, BUN 16, creatinine 1.7.  Hemoglobin A1c was 5.6.  CK 120, MB 4.3, index 3.6.  Troponin-I 1.67 and  B-type natriuretic peptide was 579.  The max CPK-MB was 5.2 with  troponin of 1.11 and BNP was 460.  Cholesterol 134, triglycerides 145,  HDL 35, ratio 3.8.  VLDL 29.  TSH 4.414.  Urinalysis on July 15, 2006  was benign; on July 18, 2006, revealed benign also.  Blood type O  positive, antibody screen negative.  Blood cultures x2 showed no growth.  Urine culture on July 15, 2006 showed enterococcus 20,000 colonies per  mL.  Catheterized urine of July 18, 2006 and July 20, 2006 revealed no  growth.   DISCHARGE INSTRUCTIONS:  He can increase his activities slowly.  Walk  with assistance.  Crutches, weightbearing as tolerated on left leg.  Did  review total knee instruction sheet.  No restrictions on diet.  Lovenox  40 mg inject subcutaneously as taught.  Followup with Dr. Madelon Lips on  Thursday, July 31, 2006, and Dr. Eden Emms.  He was discharged in improved  condition.  Continue with his home meds.      Oris Drone Santiago Bumpers, P.A.-C.      Dyke Brackett, M.D.  Electronically Signed    BDP/MEDQ  D:  09/23/2006  T:  09/23/2006  Job:  [id number]

## 2010-09-07 NOTE — Cardiovascular Report (Signed)
NAME:  Phillip Larson, Phillip Larson                ACCOUNT NO.:  [id number]   MEDICAL RECORD NO.:  [id number]          PATIENT TYPE:  OIB   LOCATION:  1961                         FACILITY:  [facility]   PHYSICIAN:  Noralyn Pick. Eden Emms, MD, FACCDATE OF BIRTH:  [birth year]   DATE OF PROCEDURE:  DATE OF DISCHARGE:                            CARDIAC CATHETERIZATION   INDICATIONS:  Status post knee surgery with episode of hypoxemia,  continued exercise-induced desaturation, and multiple coronary risk  factors.   CINE CATHETERIZATION:  Done from the right femoral artery and vein.   Left main coronary artery had a 30% tubular stenosis.   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  There is a 50% tubular  lesion which was adjacent to the aneurysmal segment.  The mid and distal  LAD had 30% multiple discrete lesions.   First diagonal branch had 30% multiple discrete lesions.   Circumflex coronary artery was nondominant but large.  The proximal  portion had 40-50% tubular disease.  In the proximal area of the takeoff  of the first obtuse marginal branch had 60% ostial disease which was  smooth and tubular.   The distal circ had 30-40% multiple discrete lesions.  The second and  third obtuse marginal branches were normal.  There were extensive left-  to-right collaterals from septal perforators in the circ to the RCA.   The RCA was dominant and was 100% occluded ostially, just after the  conus branch.   RAO VENTRICULOGRAPHY:  RAO ventriculography showed minimal inferobasal  wall hypokinesis.  The EF was 55-60%.  There was no gradient across  aortic valve and no MR.   RIGHT HEART CATHETERIZATION:  Was done due to the patient's shortness of  breath and desaturations. The mean right atrial pressure was 4, RV  pressure was 35/4, PA pressure was 31/10.  Mean pulmonary capillary  wedge pressure was 5.  LV pressure was 135/11.  Aortic  pressure was  135/61.   Saturation run was performed to rule out shunt.   IVC sat was 72%, mean RA sat was 68%, RV sat was 73%, PA sat was 73%.  Mean pulmonary capillary wedge sat was 96%.   The patient's cardiac index was 3.3 with a cardiac output of 6.5.   IMPRESSION:  The patient had coronary artery disease.  However, it  appears stable.  The left-sided lesions are not critical and he has  excellent collaterals to the right coronary artery with normal left  ventricular function.   Despite his total right coronary artery with collaterals, there is no  evidence of mitral regurgitation or significant valvular disease.   The patient sat run is negative for shunt.   His PA pressures would indicate no evidence of pulmonary hypertension.   Again, the patient's symptoms are somewhat of an enigma.  It may be  worthwhile to re-image his chest.  I will also have to talk to Dr.  Shelle Iron in regards to other etiologies to his exercise-induced  desaturation in the setting of no pre-existing COPD.  It also  may be  worthwhile for the patient to have a cardiopulmonary stress test.      [name], MD, [facility]  Electronically Signed     PCN/MEDQ  D:  08/07/2006  T:  08/07/2006  Job:  [id number]

## 2010-09-07 NOTE — Assessment & Plan Note (Signed)
[facility]                            CARDIOLOGY OFFICE NOTE   Phillip Larson, Phillip Larson                    MRN:          [id number]  DATE:08/14/2006                            DOB:          [birth year]    HISTORY OF PRESENT ILLNESS:  Phillip Larson returns today for follow up.  He  has been somewhat of an enigma.  He had a rocky postop course from a  knee replacement.  The patient had his heart cath on August 07, 2006.  We  did an extensive evaluation.  He had a total right coronary artery with  collateralization and no critical disease in the left system.  His  ventriculography showed a normal EF of 55-60%.  He had no significant MR  or aortic valve gradients.  His right heart pressures were normal.  His  PA pressure was 31/10.  His mean pulmonary capillary wedge pressure was  5.  We did the saturation run and there was no evidence of a shunt.  His  PA saturation was 73% and his mean pulmonary capillary wedge saturation  was 96%.   In talking to the patient regarding his dyspnea, it seems to be  improving slowly.  He has not had any significant fever or coughs.  He  continues to occasionally get episodes which sound like anxiety or  claustrophobia.  He feels like things are closing in, and he may  actually hyperventilate.  This has happened at night when he tries to  sleep and also once at the barber shop.   He had a venous Duplex study at [facility] and preliminary report which  was negative.   The patient is due to follow up with Dr. Shelle Iron.  He has a transcranial  Doppler scheduled for next week, although, with a negative invasive  shunt run, I think that this would be unlikely to be positive.   In regards to the patient's coronary disease, he has been stable.  He is  not having any significant angina.  In deed, the total right coronary  artery with collaterals was a silent event.  We have maintained him on  aspirin, beta blocker and Statin  drugs.   His review of systems is otherwise remarkable for continued knee  problems.  He is now getting more intensive rehab.  He has not had a  good recovery from his surgery.  It has continued to be swollen with  decreased range of motion and review of systems is otherwise negative.   MEDICATIONS:  1. Lovastatin 40 mg daily.  2. Namenda 10 mg daily.  3. Razadyne ER 24 mg daily.  4. Sertraline 50 mg daily.  5. Toprol 50 mg daily.  6. Xanax 0.5 daily.  7. Prilosec 20 mg daily.  8. Aspirin daily.   PHYSICAL EXAMINATION:  VITAL SIGNS:  Blood pressure 130/70, pulse 70 and  regular, resting saturation was 97% and he maintained 97% after walking  up and down the halls for 5 minutes.  HEENT:  Normal.  Carotids normal without bruits.  LUNGS:  Clear.  Currently, no wheezes and no crackles.  HEART:  S1. S2.  Normal heart sounds.  ABDOMEN:  Benign.  Cath site is well-healed.  EXTREMITIES:  Distal pulses are intact.  No edema.  SKIN:  Warm and dry.  His left knee, however, has knee replacement scar.  It is somewhat warm to touch.  It is still swollen with decreased range  of motion.   IMPRESSION:  Dyspnea postoperatively with low saturation.  No evidence  of active cardiac problems.  The patient does have coronary disease  which is stable.  He has good LV function with no evidence of pulmonary  hypertension or shunt.  He will see Dr. Shelle Iron back in follow up in 8  weeks.   He will follow up with his transcranial Dopplers.   I encouraged him to continue to seek advise from Dr. Madelon Lips in regards  to his knee as it appears to be slow progress.  From a coronary  standpoint, we will continue his aspirin, Toprol and Lovastatin.   It is encouraging that his lung exam is now normal and he does not  desaturate with activity, at least in our office.   All of the patient's questions were answered today, and I will see him  back in about six months.     Noralyn Pick. Eden Emms, MD, [facility]   Electronically Signed    PCN/MedQ  DD: 08/14/2006  DT: 08/14/2006  Job #: [id number]

## 2010-09-07 NOTE — Consult Note (Signed)
NAME:  Phillip Larson, Phillip Larson NO.:  [id number]   MEDICAL RECORD NO.:  [id number]          PATIENT TYPE:  INP   LOCATION:  3728                         FACILITY:  [facility]   PHYSICIAN:  Rod Holler, MD     DATE OF BIRTH:  [birth year]   DATE OF CONSULTATION:  DATE OF DISCHARGE:                                 CONSULTATION   CHIEF COMPLAINT:  Low oxygen levels.   HISTORY OF PRESENT ILLNESS:  Phillip Larson is a 76 year old male, very  pleasant, status post total knee replacement whose postoperative course  has been complicated by hypoxemia.  Postoperatively the patient denies  chest pain, denies shortness of breath, no CHF symptoms, denies  palpitations.  He has had no syncope or presyncope.  He was noted on  oxygen saturation levels to have hypoxia.  He had a subsequent arterial  blood gas with low pO2 levels.  Otherwise, the patient is completely  asymptomatic other than pain in his left knee.  Prior to surgery he did  have some dyspnea on exertion, but no other CHF symptoms, no chest pain  with activity.   PAST MEDICAL HISTORY:  1. Hypertension.  2. CVA.  3. Osteoarthritis.  4. Former tobacco use.  5. Peptic ulcer disease.  6. Hyperlipidemia.   CURRENT MEDICATIONS:  1. Xanax 0.5 mg p.o. at bedtime.  2. Aspirin 81 mg p.o. daily.  3. Valium 5 mg p.o. q.8 h.  4. Colace 100 mg p.o. b.i.d.  5. Lasix 20 mg IV q.12 h.  6. Atarax 25 mg p.o. b.i.d.  7. Namenda 10 mg p.o. b.i.d.  8. Nadolol 40 mg p.o. daily.  9. Protonix 40 mg p.o. daily.  10.Potassium chloride 20 mEq p.o. t.i.d.  11.Prednisone 50 mg p.o. q.6 h.  12.Senna one tablet p.o. b.i.d.  13.Zoloft 50 mg p.o. daily.  14.Zocor 20 mg p.o. at bedtime.   ALLERGIES:  CONTRAST ALLERGY.   SOCIAL HISTORY:  The patient works in Psychologist, clinical in [place], is  married, former tobacco use.   FAMILY HISTORY:  No known history of coronary artery disease.   REVIEW OF SYSTEMS:  All systems reviewed in detail were negative  except  as noted in the history of present illness.   PHYSICAL EXAMINATION:  VITAL SIGNS:  Temperature 100.2, temperature max  over the last 24 hours 101.8, heart rate 74, respiratory rate 20, blood  pressure 147/84, oxygen saturation 98% on 2 liters by nasal cannula.  GENERAL:  Well-developed, well-nourished male, alert and oriented x3, no  apparent distress.  HEENT:  Atraumatic, normocephalic; pupils equal, round, react to light;  extraocular movements intact.  NECK:  Supple.  No adenopathy.  No JVD.  No carotid bruits.  CHEST:  Lungs with faint bibasilar crackles, otherwise clear and equal  bilaterally.  CORONARY:  Regular rate and rhythm, normal S1-S2, 1/6 systolic ejection  murmur heard best at the left upper sternal border.  ABDOMEN:  Soft, nontender, nondistended, active bowel sounds.  EXTREMITIES:  Left lower extremity immobilized, right lower extremity  without edema.  NEUROLOGIC:  No focal deficits.   LABORATORIES:  Troponin 1.67, CK 120, CK-MB 4.3, CK-MB index 3.6.  BNP  579.  D-dimer 0.94.  ABG: pH 7.446, pO2 55, bicarbonate 27, pCO2 28.  Sodium 130, potassium 4.1, chloride 96, bicarb 27, BUN 7, creatinine 1,  glucose 150.  White blood cell count 7.3, hematocrit 29.6, platelet  count 168.  Chest x-ray with bibasilar atelectasis, cardiomegaly.  EKG  shows normal sinus rhythm, old inferior infarct, poor R-wave  progression, inferior T-wave inversions, anterior T-wave flattening,  poor quality tracing, no previous EKG available.   IMPRESSION AND PLAN:  Phillip Larson is a 76 year old male postoperative from  a total knee replacement, history of hypertension and hyperlipidemia,  positive troponin postoperatively with hypoxemia.  The patient has no  complaints of chest pain.   PLAN:  1. Agree with Lovenox 1 mg/kg q.12 h. if orthopedic agrees.  2. Agree with CT scan of the chest.  3. Increase aspirin 325 mg p.o. daily.  4. Change from nadolol to Lopressor.  5. Change from  Zocor to a higher dose of Lipitor.  6. Daily EKG, serial cardiac enzymes, transthoracic echocardiogram in      the morning.  7. Agree with transfer to the cardiac floor.  8. Keep potassium 4 to 5, magnesium greater than 2.  9. Adequate pain control.  10.Sliding scale insulin for glucose control.      Rod Holler, MD  Electronically Signed     TRK/MEDQ  D:  07/20/2006  T:  07/21/2006  Job:  [id number]

## 2010-09-07 NOTE — Assessment & Plan Note (Signed)
[facility]                            CARDIOLOGY OFFICE NOTE   JEET, SHOUGH                    MRN:          [id number]  DATE:07/31/2006                            DOB:          Aug 25, 1934    Mr. Janoski returns today for followup.  He was actually sent here as an  urgent add-on by Dr. Madelon Lips.  He is 76 years old.  We saw him in the  hospital postoperatively.  He had left knee surgery and got hypoxemic  afterwards.  The patient had a CT scan that was negative for PE.  His  chest x-ray showed some atelectasis and a small left effusion but  nothing else.  He had minor bump in his troponins as I recall, but had a  2D echocardiogram which was totally normal with normal RV and LV  function, no evidence of cor pulmonale, no regional wall motion  abnormalities, and no valve disease.  I did note that he was  progressively somewhat anemic and I believe his discharge hematocrit was  27.3.  Blood gases at the time of the hospitalization had a PO2 of 85.   The patient has been at rehab.  Apparently at orthopedic rehab his sats  have been dropping into the 70s.   Dr. Madelon Lips asked [place] to see him.  I did note that in the hospital his  troponins were in the 1.6 to 0.44 range, but his CPKs were negative.   Coronary risk factors include family history of coronary disease.  He  does not have hyperlipidemia, his LDL was 70 in the hospital.   PAST MEDICAL HISTORY:  Remarkable for a question of dementia, question  of EtOH abuse.  He is a previous smoker, but quit over 10 years ago.   REVIEW OF SYSTEMS:  No significant PND, he has exertional shortness of  breath and anxiety.  He does not have any pain or swelling in his legs.  He has not had a cough or sputum production.   His 10 point review of systems otherwise negative.   MEDICATIONS:  1. Lopressor unknown dose.  2. Namenda unknown dose.  3. Razadyne unknown dose.  4. Lovastatin unknown dose.  5.  Nadolol unknown dose.  6. Hydrochlorothiazide unknown dose.   HE IS ALLERGIC TO IODINE, HE WAS PREMEDICATED PRIOR TO HIS CT.   His review of systems is as stated above.   EXAMINATION:  Remarkable for blood pressure 130/70, pulse 53 and  regular.  HEENT:  Normal, carotids normal without bruit.  LUNGS:  Clear, there is no interstitial crackles.  There is an S1, S2 with normal heart sounds.  ABDOMEN:  Benign.  LOWER EXTREMITY:  Have compression hose on bilaterally, he still has a  lot of pain and some minor swelling in the left leg but no evidence of  acute DVT.  NEURO:  Nonfocal.  SKIN:  Warm and dry.  He appears to be in no distress.  His sats were 98% - 99% sitting, but  they did drop to 73% with ambulation.   His chest x-ray is clear, there is no  cardiomegaly, there are no  infiltrates, there is no effusion.   IMPRESSION:  I am not sure why the patient is having shortness of breath  and desaturations.  I would be concerned that we missed a PE on CT scan.  I spoke to Dr. Kriste Basque personally on the phone, pulmonary will see the  patient today.  I do not think he looks sick enough to be hospitalized.  I think that either a followup CT or VQ scan would be in order.  HE  NEEDS TO BE PREMEDICATED FOR A CT SCAN DUE TO HIS DYE ALLERGY.  Since  there was an issue regarding his troponins, and now there is an issue  regarding possible pulmonary hypertension or shunt, we will proceed with  catheterization probably next week.  The risks were explained to the  patient, he is willing to proceed.  He will have prednisone and Benadryl  the night before and the morning of.  I think it is particularly  important to rule out a shunt or pulmonary hypertension given his  exercise induced desaturations and the lack of obvious baseline  emphysema.  The patient will also be given 2 liters of O2 to be worn at  home until we can figure out why he is having these desaturations.   Dr. Kriste Basque will see the  patient today at 2:00.   In regards to his heart we will continue his beta blocker therapy.  Since there is a question of coronary disease he will take an aspirin a  day.  He will continue his pain medications and we will give the  patient's paperwork and chest x-ray to him to see the pulmonary later  today.  They can make any further decisions as to whether he needs  followup imaging or hospitalization.  From a cardiac perspective he  appears stable but I am concerned that we are missing something since he  obviously has a VQ mismatch somewhere.     Noralyn Pick. Eden Emms, MD, [facility]  Electronically Signed    PCN/MedQ  DD: 07/31/2006  DT: 07/31/2006  Job #: [id number]

## 2011-12-02 ENCOUNTER — Encounter: Payer: Self-pay | Admitting: *Deleted

## 2011-12-03 ENCOUNTER — Encounter: Payer: Self-pay | Admitting: Cardiovascular Disease

## 2011-12-03 ENCOUNTER — Emergency Department (HOSPITAL_COMMUNITY)
Admission: EM | Admit: 2011-12-03 | Discharge: 2011-12-03 | Disposition: A | Discharge status: Home / Self Care | Payer: Medicare Other | Attending: Emergency Medicine | Admitting: Emergency Medicine

## 2011-12-03 ENCOUNTER — Ambulatory Visit (INDEPENDENT_AMBULATORY_CARE_PROVIDER_SITE_OTHER): Payer: Medicare Other | Admitting: Cardiovascular Disease

## 2011-12-03 ENCOUNTER — Encounter (HOSPITAL_COMMUNITY): Payer: Self-pay | Admitting: *Deleted

## 2011-12-03 VITALS — BP 136/77 | HR 55 | Ht 71.0 in | Wt 182.0 lb

## 2011-12-03 DIAGNOSIS — E782 Mixed hyperlipidemia: Secondary | ICD-10-CM | POA: Insufficient documentation

## 2011-12-03 DIAGNOSIS — M7020 Olecranon bursitis, unspecified elbow: Secondary | ICD-10-CM | POA: Insufficient documentation

## 2011-12-03 DIAGNOSIS — F0280 Dementia in other diseases classified elsewhere without behavioral disturbance: Secondary | ICD-10-CM | POA: Insufficient documentation

## 2011-12-03 DIAGNOSIS — I10 Essential (primary) hypertension: Secondary | ICD-10-CM | POA: Insufficient documentation

## 2011-12-03 DIAGNOSIS — R001 Bradycardia, unspecified: Secondary | ICD-10-CM | POA: Insufficient documentation

## 2011-12-03 DIAGNOSIS — I2510 Atherosclerotic heart disease of native coronary artery without angina pectoris: Secondary | ICD-10-CM

## 2011-12-03 DIAGNOSIS — R0989 Other specified symptoms and signs involving the circulatory and respiratory systems: Secondary | ICD-10-CM

## 2011-12-03 DIAGNOSIS — R0600 Dyspnea, unspecified: Secondary | ICD-10-CM | POA: Insufficient documentation

## 2011-12-03 DIAGNOSIS — G309 Alzheimer's disease, unspecified: Secondary | ICD-10-CM

## 2011-12-03 DIAGNOSIS — N400 Enlarged prostate without lower urinary tract symptoms: Secondary | ICD-10-CM | POA: Insufficient documentation

## 2011-12-03 DIAGNOSIS — M7022 Olecranon bursitis, left elbow: Secondary | ICD-10-CM

## 2011-12-03 DIAGNOSIS — I498 Other specified cardiac arrhythmias: Secondary | ICD-10-CM

## 2011-12-03 MED ORDER — NAPROXEN 500 MG PO TABS: 500.0000 mg | ORAL_TABLET | Freq: Two times a day (BID) | ORAL | Status: DC

## 2011-12-03 MED ORDER — CEPHALEXIN 500 MG PO CAPS: 500.0000 mg | ORAL_CAPSULE | Freq: Four times a day (QID) | ORAL | Status: AC

## 2011-12-03 NOTE — ED Provider Notes (Signed)
History     CSN: [id number]  Arrival date & time 12/03/11  2113   First MD Initiated Contact with Patient 12/03/11 2224      Chief Complaint  Patient presents with  . Elbow Pain    (Consider location/radiation/quality/duration/timing/severity/associated sxs/prior treatment) HPI  77 year old male presents complaining of left elbow pain. Patient reports symptoms started this morning. He noticed swelling to his left elbow with associated redness and pain since this am.  Pain is described as soreness, and warmth.  Pain worsening with movement and palpation.  No associated fever, chills, rash or numbness.  Denies any recent trauma.  Never had this sxs before.  Denies itch.  Has not tried any to alleviate sxs.  No other joint pain, not taking blood thinner medication.  Past Medical History  Diagnosis Date  . Mixed hyperlipidemia   . ALZHEIMERS DISEASE   . Essential hypertension, benign   . CAD   . BENIGN PROSTATIC HYPERTROPHY, HX OF     Past Surgical History  Procedure Date  . Knee surgery   . Cervical disc surgery     No family history on file.  History  Substance Use Topics  . Smoking status: Former Games developer  . Smokeless tobacco: Never Used  . Alcohol Use: Yes      Review of Systems  Constitutional: Negative for fever.  Musculoskeletal: Negative for myalgias, joint swelling and arthralgias.  Skin: Positive for color change. Negative for wound.  Neurological: Negative for numbness.  All other systems reviewed and are negative.    Allergies  Iodine and Iohexol  Home Medications   Current Outpatient Rx  Name Route Sig Dispense Refill  . ALPRAZOLAM 0.5 MG PO TABS Oral Take 0.5 mg by mouth 3 (three) times daily as needed. Anxiety    . ASPIRIN 81 MG PO TABS Oral Take 81 mg by mouth daily.    [name] BYSTOLIC 5 MG PO TABS Oral Take 1 tablet by mouth Daily.    [name] VITAMIN D 1000 UNITS PO TABS Oral Take 1,000 Units by mouth daily.    [name] DOCUSATE SODIUM 100 MG PO CAPS Oral  Take 100 mg by mouth as needed.    [name] DOXEPIN HCL 50 MG PO CAPS Oral Take 50 mg by mouth.    [name] GALANTAMINE HYDROBROMIDE ER 24 MG PO CP24 Oral Take 1 tablet by mouth Daily.    [name] HYDROXYZINE HCL 25 MG PO TABS Oral Take 1 tablet by mouth 2 (two) times daily.     [name] LEVOTHYROXINE SODIUM 75 MCG PO TABS Oral Take 1 tablet by mouth Daily.    [name] LOVASTATIN 40 MG PO TABS Oral Take 1 tablet by mouth Daily.    [name] NAMENDA 10 MG PO TABS Oral Take 10 mg by mouth at bedtime.     . TAMSULOSIN HCL 0.4 MG PO CAPS Oral Take 1 tablet by mouth Daily.    [name] ZOLPIDEM TARTRATE 5 MG PO TABS Oral Take 5 mg by mouth at bedtime as needed. Sleep      BP 153/75  Pulse 56  Temp 98.6 F (37 C) (Oral)  Resp 20  Ht 5\' 11"  (1.803 m)  Wt 182 lb (82.555 kg)  BMI 25.38 kg/m2  SpO2 100%  Physical Exam  Nursing note and vitals reviewed. Constitutional: He is oriented to person, place, and time. He appears well-developed and well-nourished. No distress.  HENT:  Head: Atraumatic.  Eyes: Conjunctivae are normal.  Neck: Neck supple.  Cardiovascular: Normal rate and  regular rhythm.  Exam reveals no gallop and no friction rub.   No murmur heard. Pulmonary/Chest: Effort normal and breath sounds normal. No respiratory distress. He exhibits no tenderness.  Musculoskeletal: Normal range of motion.  Neurological: He is alert and oriented to person, place, and time.  Skin: Skin is warm.       L elbow: moderate edema, and erythema noted to posterior elbow with warmth.  No significant joint pain with flexion and extension.  No fluctuance.  Normal sensation to light touch  L shoulder and L wrist with normal ROM, nontender.    Psychiatric: He has a normal mood and affect.    ED Course  Procedures (including critical care time)  Labs Reviewed - No data to display No results found.   No diagnosis found.  1. Bursitis, L elbow  MDM  L elbow swelling, no joint involvement.  Appearance suggestive of bursitis.  Will consult with my  attending.    11:09 PM My attending has seen and evaluate pt.  Plan to treat with keflex and naproxen.  Pt to f/u in ED tomorrow or with Dr. Chilton Si his PCP in 24 hrs for recheck.  Pt voice understanding and agrees with plan.         Fayrene Helper, PA-C 12/03/11 2310

## 2011-12-03 NOTE — ED Notes (Signed)
Pt presents with left elbow swelling, redness, and pain. Pt has full range of motion. Pt reports symptoms started this am. Pt denies fever, chills, n/v/d.

## 2011-12-03 NOTE — ED Notes (Signed)
Ice pack provided to L elbow.  

## 2011-12-03 NOTE — ED Provider Notes (Signed)
Medical screening examination/treatment/procedure(s) were conducted as a shared visit with non-physician practitioner(s) and myself.  I personally evaluated the patient during the encounter  Left elbow with erythema suspicous for bursitis, doubt septic joint, will treat with abx and nsaids and pt to return in am for recheck  Toy Baker, MD 12/03/11 2338

## 2011-12-03 NOTE — Assessment & Plan Note (Signed)
Seems functional with normal exam and no MR on exam  Echo to reasses EF given previous IMI

## 2011-12-03 NOTE — Assessment & Plan Note (Signed)
Improved and stable  Continue namenda

## 2011-12-03 NOTE — Assessment & Plan Note (Signed)
HR increased over 90 with ambulation in office  Observe  On bystolic may need to stop in future  Avoid aricept

## 2011-12-03 NOTE — Patient Instructions (Signed)
Your physician wants you to follow-up in:  YEAR WITH DR Haywood Filler will receive a reminder letter in the mail two months in advance. If you don't receive a letter, please call our office to schedule the follow-up appointment. Your physician has requested that you have an echocardiogram. Echocardiography is a painless test that uses sound waves to create images of your heart. It provides your doctor with information about the size and shape of your heart and how well your heart's chambers and valves are working. This procedure takes approximately one hour. There are no restrictions for this procedure. DX DYSPNEA Your physician recommends that you continue on your current medications as directed. Please refer to the Current Medication list given to you today.

## 2011-12-03 NOTE — Assessment & Plan Note (Signed)
Stable with no angina and good activity level.  Continue medical Rx  

## 2011-12-03 NOTE — ED Notes (Signed)
MD at bedside. Dr. Allen at bedside.  

## 2011-12-03 NOTE — Progress Notes (Signed)
Patient ID: Phillip Larson, male   DOB: Aug 28, 1934, 77 y.o.   MRN: [id number] Phillip Larson is seen today for followup of his blood pressure. It seems to be better controlled on Hyzaar. He has been more active. He is watching the salt in his diet. He also has hypercholesterolemia Labs 10/23/11 reviewed  Cr 1.22, normal LFTS and LDL 91 HDL 56 TC 202  He has CAD with a total RCA that is collaterized. He had a low risk myovue in 2009 . He is not having any chest pain PND or orthopnea. He says Dr. Lenoria Farrier. Chilton Si is also notices blood pressure improved on Hyzaar. interestingly today his Alzheimer's disease also seems improved. He was much more coherent and talkative.He is active walking and playing tennis    Has dyspnea.  More at night Sensation of not being able to take a deep breath. Trouble sleeping.  Had been drinking bourbon at night and stopped a few weeks ago No signs of withrdawal.   ROS: Denies fever, malais, weight loss, blurry vision, decreased visual acuity, cough, sputum, , hemoptysis, pleuritic pain, palpitaitons, heartburn, abdominal pain, melena, lower extremity edema, claudication, or rash.  All other systems reviewed and negative  General: Affect appropriate Healthy:  appears stated age HEENT: normal Neck supple with no adenopathy JVP normal no bruits no thyromegaly Lungs clear with no wheezing and good diaphragmatic motion Heart:  S1/S2 no murmur, no rub, gallop or click PMI normal Abdomen: benighn, BS positve, no tenderness, no AAA no bruit.  No HSM or HJR Distal pulses intact with no bruits No edema Neuro non-focal Skin warm and dry No muscular weakness   Current Outpatient Prescriptions  Medication Sig Dispense Refill  . ALPRAZolam (XANAX) 0.5 MG tablet PRN      . BYSTOLIC 5 MG tablet Take 1 tablet by mouth Daily.      [name] galantamine (RAZADYNE ER) 24 MG 24 hr capsule Take 1 tablet by mouth Daily.      . hydrOXYzine (ATARAX/VISTARIL) 25 MG tablet Take 1 tablet by mouth Daily.      [name]  levothyroxine (SYNTHROID, LEVOTHROID) 75 MCG tablet Take 1 tablet by mouth Daily.      [name] lovastatin (MEVACOR) 40 MG tablet Take 1 tablet by mouth Daily.      [name] NAMENDA 10 MG tablet Take 1 tablet by mouth Daily.      . Tamsulosin HCl (FLOMAX) 0.4 MG CAPS Take 1 tablet by mouth Daily.      [name] zolpidem (AMBIEN) 5 MG tablet Take 1 tablet by mouth Daily.        Allergies  Iodine and Iohexol  Electrocardiogram:  NSR rate 44 LVH old IMI Q;s 2,3,F Assessment and Plan

## 2011-12-03 NOTE — Assessment & Plan Note (Signed)
Cholesterol is at goal.  Continue current dose of statin and diet Rx.  No myalgias or side effects.  F/U  LFT's in 6 months. No results found for this basename: LDLCALC            See labs from [facility] in HPI

## 2011-12-03 NOTE — Assessment & Plan Note (Signed)
Well controlled.  Continue current medications and low sodium Dash type diet.    

## 2011-12-09 ENCOUNTER — Ambulatory Visit (HOSPITAL_COMMUNITY): Payer: Medicare Other | Attending: Cardiology

## 2011-12-09 DIAGNOSIS — I059 Rheumatic mitral valve disease, unspecified: Secondary | ICD-10-CM | POA: Insufficient documentation

## 2011-12-09 DIAGNOSIS — R0989 Other specified symptoms and signs involving the circulatory and respiratory systems: Secondary | ICD-10-CM | POA: Insufficient documentation

## 2011-12-09 DIAGNOSIS — E785 Hyperlipidemia, unspecified: Secondary | ICD-10-CM | POA: Insufficient documentation

## 2011-12-09 DIAGNOSIS — I379 Nonrheumatic pulmonary valve disorder, unspecified: Secondary | ICD-10-CM | POA: Insufficient documentation

## 2011-12-09 DIAGNOSIS — R0600 Dyspnea, unspecified: Secondary | ICD-10-CM

## 2011-12-09 DIAGNOSIS — I079 Rheumatic tricuspid valve disease, unspecified: Secondary | ICD-10-CM | POA: Insufficient documentation

## 2011-12-09 DIAGNOSIS — I10 Essential (primary) hypertension: Secondary | ICD-10-CM | POA: Insufficient documentation

## 2011-12-09 DIAGNOSIS — R0602 Shortness of breath: Secondary | ICD-10-CM

## 2011-12-09 DIAGNOSIS — I2510 Atherosclerotic heart disease of native coronary artery without angina pectoris: Secondary | ICD-10-CM | POA: Insufficient documentation

## 2011-12-09 DIAGNOSIS — R0609 Other forms of dyspnea: Secondary | ICD-10-CM | POA: Insufficient documentation

## 2011-12-09 DIAGNOSIS — G309 Alzheimer's disease, unspecified: Secondary | ICD-10-CM | POA: Insufficient documentation

## 2011-12-09 DIAGNOSIS — F0280 Dementia in other diseases classified elsewhere without behavioral disturbance: Secondary | ICD-10-CM | POA: Insufficient documentation

## 2011-12-09 NOTE — Progress Notes (Signed)
Echocardiogram performed.  

## 2012-02-11 NOTE — Addendum Note (Signed)
Addended by: Marrion Coy L on: 02/11/2012 02:44 PM   Modules accepted: Orders

## 2012-03-13 ENCOUNTER — Ambulatory Visit (INDEPENDENT_AMBULATORY_CARE_PROVIDER_SITE_OTHER)
Admission: RE | Admit: 2012-03-13 | Discharge: 2012-03-13 | Disposition: A | Discharge status: Home / Self Care | Payer: Medicare Other | Source: Ambulatory Visit | Attending: Emergency Medicine | Admitting: Emergency Medicine

## 2012-03-13 ENCOUNTER — Ambulatory Visit (INDEPENDENT_AMBULATORY_CARE_PROVIDER_SITE_OTHER): Payer: Medicare Other | Admitting: Emergency Medicine

## 2012-03-13 ENCOUNTER — Encounter: Payer: Self-pay | Admitting: Emergency Medicine

## 2012-03-13 VITALS — BP 110/68 | HR 55 | Temp 97.9°F | Ht 71.0 in | Wt 187.8 lb

## 2012-03-13 DIAGNOSIS — R0600 Dyspnea, unspecified: Secondary | ICD-10-CM

## 2012-03-13 DIAGNOSIS — R0989 Other specified symptoms and signs involving the circulatory and respiratory systems: Secondary | ICD-10-CM

## 2012-03-13 NOTE — Assessment & Plan Note (Signed)
Mild dyspnea, appears to bother him most with positional changes, not so much w exertion. Could reflect some mild emphysematous change and hyperinflation + increased abd girth. Will check PFT, consider SABA trial if he has significant AFL. Check CXR today to r/o paralyzed HD, etc.

## 2012-03-13 NOTE — Patient Instructions (Addendum)
Your spirometry today was normal.  We will perform a CXR today to compare with your prior. You will be called with the results of the CXR Continue to exercise and stay active Follow with Dr Delton Coombes if your breathing worsens in any way

## 2012-03-13 NOTE — Progress Notes (Signed)
Subjective:    Patient ID: Phillip Larson, male    DOB: [birth year], 77 y.o.   MRN: [id number]  HPI 77 yo former smoker (50 pk-yrs), hx CAD, HTN. Referred today by Dr Chilton Si for evaluation of dyspnea that seems to bother him when he crosses his arms, lays on his side or prone. He is still able to play tennis, denies any SOB when he plays! He does notice some increased SOB when climbing stairs. No cough or CP. His spouse confirms that his breathing has been stable. He had CXR 2/12, no abnormalities. His wt has been fairly stable, but more abd girth than before.    Review of Systems  Constitutional: Negative for fever and unexpected weight change.  HENT: Positive for dental problem. Negative for ear pain, nosebleeds, congestion, sore throat, rhinorrhea, sneezing, trouble swallowing, postnasal drip and sinus pressure.   Eyes: Negative for redness and itching.  Respiratory: Positive for shortness of breath. Negative for cough, chest tightness and wheezing.   Cardiovascular: Negative for palpitations and leg swelling.  Gastrointestinal: Negative for nausea and vomiting.  Genitourinary: Negative for dysuria.  Musculoskeletal: Negative for joint swelling.  Skin: Negative for rash.  Neurological: Negative for headaches.  Hematological: Does not bruise/bleed easily.  Psychiatric/Behavioral: Negative for dysphoric mood. The patient is not nervous/anxious.     Past Medical History  Diagnosis Date  . Mixed hyperlipidemia   . ALZHEIMERS DISEASE   . Essential hypertension, benign   . CAD   . BENIGN PROSTATIC HYPERTROPHY, HX OF      Family History  Problem Relation Age of Onset  . Cancer Father     pancreatic  . Cancer Mother     pancreatic     History   Social History  . Marital Status: Married    Spouse Name: N/A    Number of Children: N/A  . Years of Education: N/A   Occupational History  . retired    Social History Main Topics  . Smoking status: Former Smoker -- 2.0 packs/day  for 25 years    Types: Cigarettes    Quit date: 04/22/1992  . Smokeless tobacco: Never Used  . Alcohol Use: Yes     Comment: beer/wine daily  . Drug Use: No  . Sexually Active: Not on file   Other Topics Concern  . Not on file   Social History Narrative  . No narrative on file     Allergies  Allergen Reactions  . Iodine     REACTION: unspecified: internal  . Iohexol      Code: HIVES      Outpatient Prescriptions Prior to Visit  Medication Sig Dispense Refill  . ALPRAZolam (XANAX) 0.5 MG tablet Take 0.5 mg by mouth 3 (three) times daily as needed. Anxiety      . aspirin 81 MG tablet Take 81 mg by mouth daily.      [name] BYSTOLIC 5 MG tablet Take 1 tablet by mouth Daily.      . cholecalciferol (VITAMIN D) 1000 UNITS tablet Take 1,000 Units by mouth daily.      [name] docusate sodium (COLACE) 100 MG capsule Take 100 mg by mouth as needed.      . galantamine (RAZADYNE ER) 24 MG 24 hr capsule Take 1 tablet by mouth Daily.      . hydrOXYzine (ATARAX/VISTARIL) 25 MG tablet Take 1 tablet by mouth daily.       [name] levothyroxine (SYNTHROID, LEVOTHROID) 75 MCG tablet Take 1 tablet by mouth  Daily.      . lovastatin (MEVACOR) 40 MG tablet Take 1 tablet by mouth Daily.      [name] NAMENDA 10 MG tablet Take 10 mg by mouth 2 (two) times daily.       . Tamsulosin HCl (FLOMAX) 0.4 MG CAPS Take 1 tablet by mouth Daily.      [name] doxepin (SINEQUAN) 50 MG capsule Take 50 mg by mouth.      . naproxen (NAPROSYN) 500 MG tablet Take 1 tablet (500 mg total) by mouth 2 (two) times daily.  30 tablet  0  . zolpidem (AMBIEN) 5 MG tablet Take 5 mg by mouth at bedtime as needed. Sleep       Last reviewed on 03/13/2012  4:04 PM by Lazarus Salines, RN       Objective:   Physical Exam Filed Vitals:   03/13/12 1605  BP: 110/68  Pulse: 55  Temp: 97.9 F (36.6 C)   Gen: Pleasant, well-nourished, in no distress,  normal affect  ENT: No lesions,  mouth clear,  oropharynx clear, no postnasal drip  Neck: No  JVD, no TMG, no carotid bruits  Lungs: No use of accessory muscles, no dullness to percussion, clear without rales or rhonchi  Cardiovascular: RRR, heart sounds normal, no murmur or gallops, no peripheral edema  Musculoskeletal: No deformities, no cyanosis or clubbing  Neuro: alert, non focal  Skin: Warm, no lesions or rashes     Assessment & Plan:  Dyspnea Mild dyspnea, appears to bother him most with positional changes, not so much w exertion. Could reflect some mild emphysematous change and hyperinflation + increased abd girth. Will check PFT, consider SABA trial if he has significant AFL. Check CXR today to r/o paralyzed HD, etc.

## 2012-03-16 ENCOUNTER — Telehealth: Payer: Self-pay | Admitting: Emergency Medicine

## 2012-03-16 NOTE — Progress Notes (Signed)
Quick Note:  ATC pt, no answer. LMOMTCB ______ 

## 2012-03-16 NOTE — Telephone Encounter (Signed)
Notes Recorded by Leslye Peer, MD on 03/13/2012 at 5:13 PM Please let patient know that his CXR is normal -----  I spoke with patient about results and he verbalized understanding and had no questions.

## 2012-04-22 HISTORY — PX: MULTIPLE TOOTH EXTRACTIONS: SHX2053

## 2012-08-05 ENCOUNTER — Other Ambulatory Visit: Payer: Self-pay | Admitting: Geriatric Medicine

## 2012-08-05 MED ORDER — ALPRAZOLAM 0.5 MG PO TABS: ORAL_TABLET | ORAL | Status: DC

## 2012-08-06 ENCOUNTER — Other Ambulatory Visit: Payer: Self-pay | Admitting: Geriatric Medicine

## 2012-08-06 MED ORDER — TRAZODONE HCL 50 MG PO TABS: 50.0000 mg | ORAL_TABLET | Freq: Every day | ORAL | Status: DC

## 2012-08-06 MED ORDER — ALPRAZOLAM 0.5 MG PO TABS: ORAL_TABLET | ORAL | Status: DC

## 2012-08-26 ENCOUNTER — Encounter: Payer: Self-pay | Admitting: Internal Medicine

## 2012-08-27 ENCOUNTER — Encounter: Payer: Self-pay | Admitting: *Deleted

## 2012-08-31 ENCOUNTER — Encounter: Payer: Self-pay | Admitting: *Deleted

## 2012-09-01 ENCOUNTER — Other Ambulatory Visit: Payer: Self-pay | Admitting: *Deleted

## 2012-09-01 ENCOUNTER — Encounter: Payer: Self-pay | Admitting: Internal Medicine

## 2012-09-01 ENCOUNTER — Ambulatory Visit (INDEPENDENT_AMBULATORY_CARE_PROVIDER_SITE_OTHER): Payer: Medicare Other | Admitting: Internal Medicine

## 2012-09-01 VITALS — BP 124/64 | HR 58 | Temp 96.8°F | Resp 16 | Ht 69.0 in | Wt 182.2 lb

## 2012-09-01 DIAGNOSIS — I498 Other specified cardiac arrhythmias: Secondary | ICD-10-CM

## 2012-09-01 DIAGNOSIS — F5104 Psychophysiologic insomnia: Secondary | ICD-10-CM

## 2012-09-01 DIAGNOSIS — F0280 Dementia in other diseases classified elsewhere without behavioral disturbance: Secondary | ICD-10-CM

## 2012-09-01 DIAGNOSIS — R0600 Dyspnea, unspecified: Secondary | ICD-10-CM

## 2012-09-01 DIAGNOSIS — G4700 Insomnia, unspecified: Secondary | ICD-10-CM

## 2012-09-01 DIAGNOSIS — G609 Hereditary and idiopathic neuropathy, unspecified: Secondary | ICD-10-CM

## 2012-09-01 DIAGNOSIS — R0989 Other specified symptoms and signs involving the circulatory and respiratory systems: Secondary | ICD-10-CM

## 2012-09-01 DIAGNOSIS — Z87898 Personal history of other specified conditions: Secondary | ICD-10-CM

## 2012-09-01 DIAGNOSIS — I2510 Atherosclerotic heart disease of native coronary artery without angina pectoris: Secondary | ICD-10-CM

## 2012-09-01 DIAGNOSIS — R001 Bradycardia, unspecified: Secondary | ICD-10-CM

## 2012-09-01 DIAGNOSIS — I10 Essential (primary) hypertension: Secondary | ICD-10-CM

## 2012-09-01 DIAGNOSIS — G309 Alzheimer's disease, unspecified: Secondary | ICD-10-CM

## 2012-09-01 DIAGNOSIS — E782 Mixed hyperlipidemia: Secondary | ICD-10-CM

## 2012-09-01 MED ORDER — ZOLPIDEM TARTRATE 10 MG PO TABS: 10.0000 mg | ORAL_TABLET | Freq: Every evening | ORAL | Status: DC | PRN

## 2012-09-01 MED ORDER — TRAZODONE HCL 50 MG PO TABS: ORAL_TABLET | ORAL | Status: DC

## 2012-09-01 MED ORDER — LOVASTATIN 40 MG PO TABS: ORAL_TABLET | ORAL | Status: DC

## 2012-09-01 MED ORDER — FINASTERIDE 5 MG PO TABS: ORAL_TABLET | ORAL | Status: DC

## 2012-09-01 NOTE — Progress Notes (Signed)
Subjective:    Patient ID: Phillip Larson, male    DOB: [birth year], 78 y.o.   MRN: [id number]  HPI Insomnia:   still having trouble getting to sleep. Awakens early. Gwenith Daily been tried on multiple medications. Claims he has stopped using alcohol for sedative effects. Drinks a little wine each night. Rarely drinks hard liquor. Quit about 1 1/2 years ago.  Mixed hyperlipidemia  No recent lab work.  ALZHEIMERS DISEASE  stable  Essential hypertension, benign  stable  CAD  No angina  BENIGN PROSTATIC HYPERTROPHY, HX OF  Dribbling. Hesitation. Unchanged.  Dyspnea  Chronic. Unchanged.  Bradycardia: chronic. Stable  Peripheral neuropathy  Numb in feet distally for the last couple of years. Like cardboard under the feet. Worse after playing tennis.    Current Outpatient Prescriptions on File Prior to Visit  Medication Sig Dispense Refill  . ALPRAZolam (XANAX) 0.5 MG tablet Take one half to one tablet by mouth if needed for anxiety.  30 tablet  5  . aspirin 81 MG tablet Take 81 mg by mouth daily.      [name] BYSTOLIC 5 MG tablet Take 1 tablet by mouth Daily.      . cholecalciferol (VITAMIN D) 1000 UNITS tablet Take 1,000 Units by mouth daily.      [name] docusate sodium (COLACE) 100 MG capsule Take 100 mg by mouth as needed.      . galantamine (RAZADYNE ER) 24 MG 24 hr capsule Take 1 tablet by mouth Daily.      . hydrOXYzine (ATARAX/VISTARIL) 25 MG tablet Take 1 tablet by mouth daily. Take one tablet twice a day      . levothyroxine (SYNTHROID, LEVOTHROID) 75 MCG tablet Take 1 tablet by mouth Daily.      . Memantine HCl ER (NAMENDA XR) 28 MG CP24 Take by mouth. Take one tablet once a day for memory      . Multiple Vitamin (MULTIVITAMIN) tablet Take 1 tablet by mouth daily. Take one tablet once a day      . Omega-3 Fatty Acids (FISH OIL) 1000 MG CAPS Take by mouth. Take one tablet once a day      . omeprazole (PRILOSEC) 40 MG capsule Take 40 mg by mouth daily.      . Tamsulosin HCl (FLOMAX) 0.4  MG CAPS Take 1 tablet by mouth Daily.       No current facility-administered medications on file prior to visit.     Review of Systems  Constitutional: Negative.   HENT: Negative.   Eyes: Negative.   Respiratory: Positive for shortness of breath. Negative for cough, choking, chest tightness and wheezing.   Cardiovascular: Negative for chest pain, palpitations and leg swelling.  Gastrointestinal: Negative.   Endocrine: Negative.   Genitourinary: Positive for frequency.       Incontinent of urine at night Sometimes loses control at night. Hx BPH.  Using tamsulosin.  Musculoskeletal: Positive for arthralgias.  Neurological: Negative for tremors, syncope, weakness and headaches.       Memory loss.  Hematological: Negative.   Psychiatric/Behavioral: Positive for sleep disturbance.       Objective:BP 124/64  Pulse 58  Temp(Src) 96.8 F (36 C)  Resp 16  Ht 5\' 9"  (1.753 m)  Wt 182 lb 3.2 oz (82.645 kg)  BMI 26.89 kg/m2    Physical Exam  Constitutional: He appears well-developed and well-nourished. No distress.  HENT:  Head: Normocephalic and atraumatic.  Right Ear: External ear normal.  Left Ear: External ear normal.  Nose: Nose normal.  Eyes: Conjunctivae and EOM are normal. Pupils are equal, round, and reactive to light.  Neck: Normal range of motion. Neck supple. No JVD present. No tracheal deviation present. No thyromegaly present.  Cardiovascular: Normal rate, regular rhythm, normal heart sounds and intact distal pulses.  Exam reveals no gallop and no friction rub.   No murmur heard. Pulmonary/Chest: Effort normal and breath sounds normal. No respiratory distress. He has no wheezes. He has no rales. He exhibits no tenderness.  Abdominal: Soft. Bowel sounds are normal. He exhibits no distension and no mass. There is no tenderness.  Musculoskeletal: Normal range of motion. He exhibits no edema and no tenderness.  Lymphadenopathy:    He has no cervical adenopathy.   Neurological: No cranial nerve deficit. Coordination normal.  Skin: Skin is warm and dry. No rash noted. No erythema. No pallor.  actinickeratosis  Psychiatric: He has a normal mood and affect. His behavior is normal. Thought content normal.  Memory loss.   Lab reports 06/18/2011 CMP; Glucose 100, BUN 17, Creatinine 1.22 Lipid Panel; Cholesterol 202, Triglycerides 276, HDL 56, LDL 91 TSH4.480 10/21/2011 CMP Glucose 100 Bun 17 Creatinine 1.22  Lipid Panel Cholesterol 202 Triglycerides 276 Hdl 56 Ldl 91  TSH 4.480 Office Visit on 09/01/2012  Component Date Value Range Status  . Vitamin B-12 09/01/2012 320  211 - 946 pg/mL Final  . Folate 09/01/2012 >19.9  >3.0 ng/mL Final   Comment: A serum folate concentration of less than 3.1 ng/mL is                          considered to represent clinical deficiency.  . Glucose 09/01/2012 89  65 - 99 mg/dL Final  . BUN 40/98/1191 8  8 - 27 mg/dL Final  . Creatinine, Ser 09/01/2012 1.12  0.76 - 1.27 mg/dL Final  . GFR calc non Af Amer 09/01/2012 64  >59 mL/min/1.73 Final  . GFR calc Af Amer 09/01/2012 74  >59 mL/min/1.73 Final  . BUN/Creatinine Ratio 09/01/2012 7* 10 - 22 Final  . Sodium 09/01/2012 140  134 - 144 mmol/L Final  . Potassium 09/01/2012 4.4  3.5 - 5.2 mmol/L Final  . Chloride 09/01/2012 102  97 - 108 mmol/L Final  . CO2 09/01/2012 28  19 - 28 mmol/L Final  . Calcium 09/01/2012 10.0  8.6 - 10.2 mg/dL Final  . Total Protein 09/01/2012 6.5  6.0 - 8.5 g/dL Final  . Albumin 47/82/9562 4.5  3.5 - 4.8 g/dL Final  . Globulin, Total 09/01/2012 2.0  1.5 - 4.5 g/dL Final  . Albumin/Globulin Ratio 09/01/2012 2.3  1.1 - 2.5 Final  . Total Bilirubin 09/01/2012 0.7  0.0 - 1.2 mg/dL Final  . Alkaline Phosphatase 09/01/2012 86  39 - 117 IU/L Final  . AST 09/01/2012 19  0 - 40 IU/L Final  . ALT 09/01/2012 18  0 - 44 IU/L Final        Assessment & Plan:  1. Mixed hyperlipidemia Recheck at future visit  2. ALZHEIMERS DISEASE Stable  3.  Essential hypertension, benign Controlled - CMP  4. CAD Asymptomatic  5. BENIGN PROSTATIC HYPERTROPHY, HX OF Continues with symptoms. Continue on Proscar. - finasteride (PROSCAR) 5 MG tablet; One daily to help reduce prostate size  Dispense: 30 tablet; Refill: 5  6. Dyspnea Improved  7. Bradycardia Resolved  8. Chronic insomnia Continues to have significant issues. He would like to try new medication. - zolpidem (AMBIEN)  10 MG tablet; Take 1 tablet (10 mg total) by mouth at bedtime as needed for sleep.  Dispense: 30 tablet; Refill: 3  9. Unspecified hereditary and idiopathic peripheral neuropathy Unchanged - Vitamin B12 - Folate

## 2012-09-01 NOTE — Patient Instructions (Signed)
Use medications as listed below.

## 2012-09-02 LAB — COMPREHENSIVE METABOLIC PANEL
ALT: 18 IU/L (ref 0–44)
Albumin/Globulin Ratio: 2.3 (ref 1.1–2.5)
Calcium: 10 mg/dL (ref 8.6–10.2)
Creatinine, Ser: 1.12 mg/dL (ref 0.76–1.27)
GFR calc non Af Amer: 64 mL/min/{1.73_m2} (ref >59)
Globulin, Total: 2 g/dL (ref 1.5–4.5)
Glucose: 89 mg/dL (ref 65–99)
Potassium: 4.4 mmol/L (ref 3.5–5.2)
Total Protein: 6.5 g/dL (ref 6.0–8.5)

## 2012-09-02 LAB — FOLATE: Folate: >19.9 ng/mL (ref >3.0)

## 2012-09-09 ENCOUNTER — Encounter: Payer: Self-pay | Admitting: Nurse Practitioner

## 2012-09-09 ENCOUNTER — Ambulatory Visit (INDEPENDENT_AMBULATORY_CARE_PROVIDER_SITE_OTHER): Payer: Medicare Other | Admitting: Nurse Practitioner

## 2012-09-09 VITALS — BP 150/88 | HR 57 | Temp 98.2°F | Resp 18 | Ht 71.0 in | Wt 177.0 lb

## 2012-09-09 DIAGNOSIS — L299 Pruritus, unspecified: Secondary | ICD-10-CM

## 2012-09-09 DIAGNOSIS — H6690 Otitis media, unspecified, unspecified ear: Secondary | ICD-10-CM

## 2012-09-09 DIAGNOSIS — H6692 Otitis media, unspecified, left ear: Secondary | ICD-10-CM

## 2012-09-09 MED ORDER — AMOXICILLIN-POT CLAVULANATE 875-125 MG PO TABS: 1.0000 | ORAL_TABLET | Freq: Two times a day (BID) | ORAL | Status: DC

## 2012-09-09 NOTE — Progress Notes (Signed)
Patient ID: Phillip Larson, male   DOB: [birth year], 78 y.o.   MRN: [id number]   Allergies  Allergen Reactions  . Iodine     REACTION: unspecified: internal  . Iohexol      Code: HIVES     Chief Complaint  Patient presents with  . left ear clogged  . medication review    HPI: Patient is a 78 y.o. male seen in the office today for painful left ear Stopped up feeling with soreness started 3 days ago. Tender around ear and puffy. No drainage; no fever or chills  Review of Systems:  Review of Systems  Constitutional: Negative for fever, chills and malaise/fatigue.  HENT: Positive for ear pain. Negative for hearing loss, congestion, sore throat and ear discharge.   Respiratory: Negative for shortness of breath.   Cardiovascular: Negative for chest pain and palpitations.  Skin: Positive for itching.  Neurological: Negative for dizziness and headaches.     Past Medical History  Diagnosis Date  . Mixed hyperlipidemia   . ALZHEIMERS DISEASE   . Essential hypertension, benign   . CAD   . BENIGN PROSTATIC HYPERTROPHY, HX OF   . Impacted cerumen   . Anxiety state, unspecified   . Pressure ulcer   . Actinic keratosis   . Enthesopathy of hip region   . Unspecified hereditary and idiopathic peripheral neuropathy   . Benign neoplasm of colon   . Unspecified vitamin D deficiency   . Other premature beats   . Diverticulosis of colon (without mention of hemorrhage)   . Unspecified hypothyroidism   . Coronary atherosclerosis of native coronary artery   . Urinary obstruction, unspecified   . Actinic keratosis   . Pain in joint, site unspecified   . Osteoarthrosis, unspecified whether generalized or localized, unspecified site   . Peptic ulcer, unspecified site, unspecified as acute or chronic, without mention of hemorrhage, perforation, or obstruction   . Other and unspecified hyperlipidemia   . Alzheimer's disease   . Myalgia and myositis, unspecified   . Insomnia, unspecified   .  Other malaise and fatigue   . External hemorrhoids without mention of complication   . Anal fissure   . Other psoriasis   . Unspecified essential hypertension   . Depressive disorder, not elsewhere classified   . Cerebrovascular disease, unspecified   . Acute, but ill-defined, cerebrovascular disease   . Reflux esophagitis   . Pain in joint, lower leg   . Palpitations   . Shortness of breath   . Coronary atherosclerosis of native coronary artery   . Impacted cerumen [id number]  . Pressure ulcer of buttock, unstageable   . Unspecified hereditary and idiopathic peripheral neuropathy   . Urinary obstruction, unspecified   . Actinic keratosis   . Osteoarthrosis, unspecified whether generalized or localized, unspecified site   . Peptic ulcer, unspecified site, unspecified as acute or chronic, without mention of hemorrhage, perforation, or obstruction   . Myalgia and myositis, unspecified   . Insomnia, unspecified   . Other malaise and fatigue   . External hemorrhoids without mention of complication   . Anal fissure   . Depressive disorder, not elsewhere classified   . Cerebrovascular disease, unspecified   . Acute, but ill-defined, cerebrovascular disease   . Palpitations    Past Surgical History  Procedure Laterality Date  . Knee surgery  07/18/2006    Dr Madelon Lips   . Cervical disc surgery    . Laminectomy c3 disc  1972  . Retinal  tear repair cryotherapy      Dr Marion Downer   . Multiple tooth extractions  2014   Social History:   reports that he quit smoking about 20 years ago. His smoking use included Cigarettes. He has a 50 pack-year smoking history. He has never used smokeless tobacco. He reports that  drinks alcohol. He reports that he does not use illicit drugs.  Family History  Problem Relation Age of Onset  . Cancer Father     pancreatic  . Cancer Mother     pancreatic    Medications: Patient's Medications  New Prescriptions   No medications on file  Previous  Medications   ALPRAZOLAM (XANAX) 0.5 MG TABLET    Take one half to one tablet by mouth if needed for anxiety.   ASPIRIN 81 MG TABLET    Take 81 mg by mouth daily.   BYSTOLIC 5 MG TABLET    Take 1 tablet by mouth Daily.   CHOLECALCIFEROL (VITAMIN D) 1000 UNITS TABLET    Take 1,000 Units by mouth daily.   DOCUSATE SODIUM (COLACE) 100 MG CAPSULE    Take 100 mg by mouth as needed.   FINASTERIDE (PROSCAR) 5 MG TABLET    One daily to help reduce prostate size   GALANTAMINE (RAZADYNE ER) 24 MG 24 HR CAPSULE    Take 1 tablet by mouth Daily.   HYDROXYZINE (ATARAX/VISTARIL) 25 MG TABLET    Take 1 tablet by mouth daily. Take one tablet twice a day   LEVOTHYROXINE (SYNTHROID, LEVOTHROID) 75 MCG TABLET    Take 1 tablet by mouth Daily.   LOVASTATIN (MEVACOR) 40 MG TABLET    Take one tablet once a day to lower cholesterol   MEMANTINE HCL ER (NAMENDA XR) 28 MG CP24    Take by mouth. Take one tablet once a day for memory   MULTIPLE VITAMIN (MULTIVITAMIN) TABLET    Take 1 tablet by mouth daily. Take one tablet once a day   OMEGA-3 FATTY ACIDS (FISH OIL) 1000 MG CAPS    Take by mouth. Take one tablet once a day   OMEPRAZOLE (PRILOSEC) 40 MG CAPSULE    Take 40 mg by mouth daily.   TAMSULOSIN HCL (FLOMAX) 0.4 MG CAPS    Take 1 tablet by mouth Daily.   TRAZODONE (DESYREL) 50 MG TABLET    Take one tablet at bedtime to help with sleep   ZOLPIDEM (AMBIEN) 10 MG TABLET    Take 1 tablet (10 mg total) by mouth at bedtime as needed for sleep.  Modified Medications   No medications on file  Discontinued Medications   No medications on file     Physical Exam: Physical Exam  Vitals reviewed. Constitutional: He is well-developed, well-nourished, and in no distress. No distress.  HENT:  Right Ear: Tympanic membrane, external ear and ear canal normal.  Left Ear: There is drainage (purlent drainage), swelling (mild swelling around ear) and tenderness (to ear, tragus, and surrounding area).  Neck: Normal range of motion.  Neck supple.  Cardiovascular: Normal rate.   Pulmonary/Chest: Effort normal and breath sounds normal.  Abdominal: Soft. Bowel sounds are normal.  Skin: Skin is warm and dry. He is not diaphoretic.    Filed Vitals:   09/09/12 1430  BP: 150/88  Pulse: 57  Temp: 98.2 F (36.8 C)  TempSrc: Oral  Resp: 18  Height: 5\' 11"  (1.803 m)  Weight: 177 lb (80.287 kg)  SpO2: 95%      Labs reviewed: Basic  Metabolic Panel:  Recent Labs  16/10/96 1457  NA 140  K 4.4  CL 102  CO2 28  GLUCOSE 89  BUN 8  CREATININE 1.12  CALCIUM 10.0   Liver Function Tests:  Recent Labs  09/01/12 1457  AST 19  ALT 18  ALKPHOS 86  BILITOT 0.7  PROT 6.5   No results found for this basename: LIPASE, AMYLASE,  in the last 8760 hours No results found for this basename: AMMONIA,  in the last 8760 hours CBC: No results found for this basename: WBC, NEUTROABS, HGB, HCT, MCV, PLT,  in the last 8760 hours Lipid Panel: No results found for this basename: CHOL, HDL, LDLCALC, TRIG, CHOLHDL, LDLDIRECT,  in the last 8760 hours   Assessment/Plan  Otitis media-  Will give Rx for Augmentin twice daily for 10 days. To follow up in office in 1 week  Pruritus- pt with chronic pruritis due to dryness of the skin- currently on aterax  However now insurance is not going to cover medication- encouraged pt to use Eucerin daily after shower to see if there was any improvement in itching; he will also call insurance to get medications that would be covered

## 2012-09-09 NOTE — Patient Instructions (Addendum)
To start using Eucerin lotion after shower daily If this does not help itch to call insurance company to see what medication they cover if they are not going to cover hydroxyzine  Will give Augmentin  to take by mouth twice daily for 10 days To follow up in the office in 1 week   Otitis Media, Adult A middle ear infection is an infection in the space behind the eardrum. The medical name for this is "otitis media." It may happen after a common cold. It is caused by a germ that starts growing in that space. You may feel swollen glands in your neck on the side of the ear infection. HOME CARE INSTRUCTIONS   Take your medicine as directed until it is gone, even if you feel better after the first few days.  Only take over-the-counter or prescription medicines for pain, discomfort, or fever as directed by your caregiver.  Occasional use of a nasal decongestant a couple times per day may help with discomfort and help the eustachian tube to drain better. Follow up with your caregiver in 10 to 14 days or as directed, to be certain that the infection has cleared. Not keeping the appointment could result in a chronic or permanent injury, pain, hearing loss and disability. If there is any problem keeping the appointment, you must call back to this facility for assistance. SEEK IMMEDIATE MEDICAL CARE IF:   You are not getting better in 2 to 3 days.  You have pain that is not controlled with medication.  You feel worse instead of better.  You cannot use the medication as directed.  You develop swelling, redness or pain around the ear or stiffness in your neck. MAKE SURE YOU:   Understand these instructions.  Will watch your condition.  Will get help right away if you are not doing well or get worse. Document Released: 01/12/2004 Document Revised: 07/01/2011 Document Reviewed: 11/13/2007 [facility] Patient Information 2014 [place], [place].

## 2012-09-16 ENCOUNTER — Encounter: Payer: Self-pay | Admitting: Nurse Practitioner

## 2012-09-16 ENCOUNTER — Ambulatory Visit (INDEPENDENT_AMBULATORY_CARE_PROVIDER_SITE_OTHER): Payer: Medicare Other | Admitting: Nurse Practitioner

## 2012-09-16 VITALS — BP 118/82 | HR 68 | Temp 98.3°F | Resp 18 | Ht 69.29 in | Wt 178.0 lb

## 2012-09-16 DIAGNOSIS — F5104 Psychophysiologic insomnia: Secondary | ICD-10-CM

## 2012-09-16 DIAGNOSIS — G4700 Insomnia, unspecified: Secondary | ICD-10-CM

## 2012-09-16 DIAGNOSIS — F0280 Dementia in other diseases classified elsewhere without behavioral disturbance: Secondary | ICD-10-CM

## 2012-09-16 DIAGNOSIS — H6690 Otitis media, unspecified, unspecified ear: Secondary | ICD-10-CM

## 2012-09-16 DIAGNOSIS — H6692 Otitis media, unspecified, left ear: Secondary | ICD-10-CM

## 2012-09-16 NOTE — Assessment & Plan Note (Signed)
Improved on trazodone- to keep taking this medication

## 2012-09-16 NOTE — Assessment & Plan Note (Signed)
To cont Aricept as prescribed

## 2012-09-16 NOTE — Progress Notes (Signed)
Patient ID: Phillip Larson, male   DOB: 12-May-1934, 78 y.o.   MRN: [id number]   Allergies  Allergen Reactions  . Iodine     REACTION: unspecified: internal  . Iohexol      Code: HIVES     Chief Complaint  Patient presents with  . Follow-up    ear issues    HPI: Patient is a 78 y.o. male seen in the office today for follow up on otitis media- reports he no longer feels stopped up. Pain and puffiness to left ear has resolved- still taking Augmentin for 2 or 3 more days. Denies fevers or chills, no headache, congestion, drainage, or pain to neck or head  pts wife also wondering if he should be taking aricept and trazodone together   Review of Systems:  Review of Systems  Constitutional: Negative for fever, chills and malaise/fatigue.  HENT: Negative for ear pain, nosebleeds, congestion, sore throat, tinnitus and ear discharge.   Respiratory: Negative for shortness of breath.   Cardiovascular: Negative for chest pain and palpitations.  Neurological: Negative for weakness and headaches.  Psychiatric/Behavioral: Positive for memory loss. The patient has insomnia (improved with trazodone).       Past Medical History  Diagnosis Date  . Mixed hyperlipidemia   . ALZHEIMERS DISEASE   . Essential hypertension, benign   . CAD   . BENIGN PROSTATIC HYPERTROPHY, HX OF   . Impacted cerumen   . Anxiety state, unspecified   . Pressure ulcer   . Actinic keratosis   . Enthesopathy of hip region   . Unspecified hereditary and idiopathic peripheral neuropathy   . Benign neoplasm of colon   . Unspecified vitamin D deficiency   . Other premature beats   . Diverticulosis of colon (without mention of hemorrhage)   . Unspecified hypothyroidism   . Coronary atherosclerosis of native coronary artery   . Urinary obstruction, unspecified   . Actinic keratosis   . Pain in joint, site unspecified   . Osteoarthrosis, unspecified whether generalized or localized, unspecified site   . Peptic ulcer,  unspecified site, unspecified as acute or chronic, without mention of hemorrhage, perforation, or obstruction   . Other and unspecified hyperlipidemia   . Alzheimer's disease   . Myalgia and myositis, unspecified   . Insomnia, unspecified   . Other malaise and fatigue   . External hemorrhoids without mention of complication   . Anal fissure   . Other psoriasis   . Unspecified essential hypertension   . Depressive disorder, not elsewhere classified   . Cerebrovascular disease, unspecified   . Acute, but ill-defined, cerebrovascular disease   . Reflux esophagitis   . Pain in joint, lower leg   . Palpitations   . Shortness of breath   . Coronary atherosclerosis of native coronary artery   . Impacted cerumen [id number]  . Pressure ulcer of buttock, unstageable   . Unspecified hereditary and idiopathic peripheral neuropathy   . Urinary obstruction, unspecified   . Actinic keratosis   . Osteoarthrosis, unspecified whether generalized or localized, unspecified site   . Peptic ulcer, unspecified site, unspecified as acute or chronic, without mention of hemorrhage, perforation, or obstruction   . Myalgia and myositis, unspecified   . Insomnia, unspecified   . Other malaise and fatigue   . External hemorrhoids without mention of complication   . Anal fissure   . Depressive disorder, not elsewhere classified   . Cerebrovascular disease, unspecified   . Acute, but ill-defined, cerebrovascular disease   .  Palpitations    Past Surgical History  Procedure Laterality Date  . Knee surgery  07/18/2006    Dr Madelon Lips   . Cervical disc surgery    . Laminectomy c3 disc  1972  . Retinal tear repair cryotherapy      Dr Marion Downer   . Multiple tooth extractions  2014   Social History:   reports that he quit smoking about 20 years ago. His smoking use included Cigarettes. He has a 50 pack-year smoking history. He has never used smokeless tobacco. He reports that  drinks alcohol. He reports that he does  not use illicit drugs.  Family History  Problem Relation Age of Onset  . Cancer Father     pancreatic  . Cancer Mother     pancreatic    Medications: Patient's Medications  New Prescriptions   No medications on file  Previous Medications   ALPRAZOLAM (XANAX) 0.5 MG TABLET    Take one half to one tablet by mouth if needed for anxiety.   AMOXICILLIN-CLAVULANATE (AUGMENTIN) 875-125 MG PER TABLET    Take 1 tablet by mouth 2 (two) times daily.   ASPIRIN 81 MG TABLET    Take 81 mg by mouth daily.   BYSTOLIC 5 MG TABLET    Take 1 tablet by mouth Daily.   CHOLECALCIFEROL (VITAMIN D) 1000 UNITS TABLET    Take 1,000 Units by mouth daily.   DOCUSATE SODIUM (COLACE) 100 MG CAPSULE    Take 100 mg by mouth as needed.   FINASTERIDE (PROSCAR) 5 MG TABLET    One daily to help reduce prostate size   GALANTAMINE (RAZADYNE ER) 24 MG 24 HR CAPSULE    Take 1 tablet by mouth Daily.   LEVOTHYROXINE (SYNTHROID, LEVOTHROID) 75 MCG TABLET    Take 1 tablet by mouth Daily.   LOVASTATIN (MEVACOR) 40 MG TABLET    Take one tablet once a day to lower cholesterol   MEMANTINE HCL ER (NAMENDA XR) 28 MG CP24    Take by mouth. Take one tablet once a day for memory   MULTIPLE VITAMIN (MULTIVITAMIN) TABLET    Take 1 tablet by mouth daily. Take one tablet once a day   OMEGA-3 FATTY ACIDS (FISH OIL) 1000 MG CAPS    Take by mouth. Take one tablet once a day   OMEPRAZOLE (PRILOSEC) 40 MG CAPSULE    Take 40 mg by mouth daily.   TAMSULOSIN HCL (FLOMAX) 0.4 MG CAPS    Take 1 tablet by mouth Daily.   TRAZODONE (DESYREL) 50 MG TABLET    Take one tablet at bedtime to help with sleep   ZOLPIDEM (AMBIEN) 10 MG TABLET    Take 1 tablet (10 mg total) by mouth at bedtime as needed for sleep.  Modified Medications   No medications on file  Discontinued Medications   HYDROXYZINE (ATARAX/VISTARIL) 25 MG TABLET    Take 1 tablet by mouth daily. Take one tablet twice a day     Physical Exam:  Filed Vitals:   09/16/12 1624  BP: 118/82   Pulse: 68  Temp: 98.3 F (36.8 C)  TempSrc: Oral  Resp: 18  Height: 5' 9.29" (1.76 m)  Weight: 178 lb (80.74 kg)   Physical Exam  Constitutional: He appears well-developed and well-nourished. No distress.  HENT:  Right Ear: Tympanic membrane and external ear normal.  Left Ear: External ear and ear canal normal. No drainage, swelling or tenderness. No mastoid tenderness.  Cerumen in canel   Neck:  Normal range of motion. Neck supple. No tracheal deviation present. No thyromegaly present.  Cardiovascular: Normal rate, regular rhythm and normal heart sounds.   Pulmonary/Chest: Effort normal and breath sounds normal.  Lymphadenopathy:    He has no cervical adenopathy.  Skin: Skin is warm and dry. He is not diaphoretic.  Psychiatric: He has a normal mood and affect.     Labs reviewed: Basic Metabolic Panel:  Recent Labs  16/10/96 1457  NA 140  K 4.4  CL 102  CO2 28  GLUCOSE 89  BUN 8  CREATININE 1.12  CALCIUM 10.0   Liver Function Tests:  Recent Labs  09/01/12 1457  AST 19  ALT 18  ALKPHOS 86  BILITOT 0.7  PROT 6.5    Assessment/Plan ALZHEIMERS DISEASE To cont Aricept as prescribed   Chronic insomnia Improved on trazodone- to keep taking this medication  otitis media- improved with medication- finish taking prescription. To call and follow up if worsening of symptoms occur.

## 2012-10-30 ENCOUNTER — Other Ambulatory Visit: Payer: Self-pay | Admitting: *Deleted

## 2012-10-30 MED ORDER — MEMANTINE HCL ER 28 MG PO CP24: 28.0000 mg | ORAL_CAPSULE | Freq: Once | ORAL | Status: DC

## 2012-11-02 ENCOUNTER — Encounter: Payer: Self-pay | Admitting: *Deleted

## 2012-11-03 ENCOUNTER — Ambulatory Visit (INDEPENDENT_AMBULATORY_CARE_PROVIDER_SITE_OTHER): Payer: Medicare Other | Admitting: Internal Medicine

## 2012-11-03 ENCOUNTER — Encounter: Payer: Self-pay | Admitting: Internal Medicine

## 2012-11-03 VITALS — BP 132/80 | HR 61 | Temp 98.0°F | Resp 14 | Ht 69.29 in | Wt 173.6 lb

## 2012-11-03 DIAGNOSIS — E782 Mixed hyperlipidemia: Secondary | ICD-10-CM

## 2012-11-03 DIAGNOSIS — I10 Essential (primary) hypertension: Secondary | ICD-10-CM

## 2012-11-03 DIAGNOSIS — G4700 Insomnia, unspecified: Secondary | ICD-10-CM | POA: Insufficient documentation

## 2012-11-03 DIAGNOSIS — G309 Alzheimer's disease, unspecified: Secondary | ICD-10-CM

## 2012-11-03 DIAGNOSIS — I2510 Atherosclerotic heart disease of native coronary artery without angina pectoris: Secondary | ICD-10-CM

## 2012-11-03 DIAGNOSIS — F5104 Psychophysiologic insomnia: Secondary | ICD-10-CM

## 2012-11-03 DIAGNOSIS — M7742 Metatarsalgia, left foot: Secondary | ICD-10-CM

## 2012-11-03 DIAGNOSIS — M7750 Other enthesopathy of unspecified foot: Secondary | ICD-10-CM

## 2012-11-03 DIAGNOSIS — G609 Hereditary and idiopathic neuropathy, unspecified: Secondary | ICD-10-CM

## 2012-11-03 DIAGNOSIS — I498 Other specified cardiac arrhythmias: Secondary | ICD-10-CM

## 2012-11-03 DIAGNOSIS — R001 Bradycardia, unspecified: Secondary | ICD-10-CM

## 2012-11-03 DIAGNOSIS — F0280 Dementia in other diseases classified elsewhere without behavioral disturbance: Secondary | ICD-10-CM

## 2012-11-03 NOTE — Progress Notes (Signed)
Subjective:    Patient ID: Phillip Larson, male    DOB: [birth year], 78 y.o.   MRN: [id number]  HPI Ears still stopped up. No pain  Bleeding at scrotum at least a couple of weeks ago. He feels it is from scratching.  When playing tennis, the ball of the feet are painful. Some numbness in feet. Cold feet.  Slow heart rate persists.   Right shoulder at acromion and at long head of the biceps has been uncomfortable for a year. Hurts more when reaching to the right. He is able to serve tennis without any problem.  Insomnia; Ambien helps.  Also uses alprazolam and trazodone. No longer consumes alcohol for sedative effects. Snores at night according to his wife. Had a sleep study  27 years ago that was interpreted as normal.  Current Outpatient Prescriptions on File Prior to Visit  Medication Sig Dispense Refill  . ALPRAZolam (XANAX) 0.5 MG tablet Take one half to one tablet by mouth if needed for anxiety.  30 tablet  5  . aspirin 81 MG tablet Take 81 mg by mouth daily.      [name] BYSTOLIC 5 MG tablet Take 1 tablet by mouth Daily.      . cholecalciferol (VITAMIN D) 1000 UNITS tablet Take 1,000 Units by mouth daily.      [name] docusate sodium (COLACE) 100 MG capsule Take 100 mg by mouth as needed.      . finasteride (PROSCAR) 5 MG tablet One daily to help reduce prostate size  30 tablet  5  . galantamine (RAZADYNE ER) 24 MG 24 hr capsule Take 1 tablet by mouth Daily.      [name] levothyroxine (SYNTHROID, LEVOTHROID) 75 MCG tablet Take 1 tablet by mouth Daily.      [name] lovastatin (MEVACOR) 40 MG tablet Take one tablet once a day to lower cholesterol  30 tablet  5  . Memantine HCl ER (NAMENDA XR) 28 MG CP24 Take 28 mg by mouth once.  30 capsule  6  . Multiple Vitamin (MULTIVITAMIN) tablet Take 1 tablet by mouth daily. Take one tablet once a day      . Omega-3 Fatty Acids (FISH OIL) 1000 MG CAPS Take by mouth. Take one tablet once a day      . omeprazole (PRILOSEC) 40 MG capsule Take 40 mg by mouth daily.       . Tamsulosin HCl (FLOMAX) 0.4 MG CAPS Take 1 tablet by mouth Daily.      . traZODone (DESYREL) 50 MG tablet Take one tablet at bedtime to help with sleep  30 tablet  5        Review of Systems  Constitutional: Negative.   HENT: Negative.   Eyes: Negative.   Respiratory: Positive for shortness of breath. Negative for cough, choking, chest tightness and wheezing.   Cardiovascular: Negative for chest pain, palpitations and leg swelling.  Gastrointestinal: Negative.   Endocrine: Negative.   Genitourinary: Positive for frequency.       Incontinent of urine at night Sometimes loses control at night. Hx BPH.  Using tamsulosin.  Musculoskeletal: Positive for arthralgias.  Neurological: Negative for tremors, syncope, weakness and headaches.       Memory loss.  Hematological: Negative.   Psychiatric/Behavioral: Positive for sleep disturbance.       Objective:BP 132/80  Pulse 61  Temp(Src) 98 F (36.7 C) (Oral)  Resp 14  Ht 5' 9.29" (1.76 m)  Wt 173 lb 9.6 oz (78.744 kg)  BMI 25.42  kg/m2    Physical Exam  Constitutional: He appears well-developed and well-nourished. No distress.  HENT:  Head: Normocephalic and atraumatic.  Right Ear: External ear normal.  Left Ear: External ear normal.  Nose: Nose normal.  Eyes: Conjunctivae and EOM are normal. Pupils are equal, round, and reactive to light.  Neck: Normal range of motion. Neck supple. No JVD present. No tracheal deviation present. No thyromegaly present.  Cardiovascular: Normal rate, regular rhythm, normal heart sounds and intact distal pulses.  Exam reveals no gallop and no friction rub.   No murmur heard. Pulmonary/Chest: Effort normal and breath sounds normal. No respiratory distress. He has no wheezes. He has no rales. He exhibits no tenderness.  Abdominal: Soft. Bowel sounds are normal. He exhibits no distension and no mass. There is no tenderness.  Musculoskeletal: Normal range of motion. He exhibits no edema and no  tenderness.  Lymphadenopathy:    He has no cervical adenopathy.  Neurological: No cranial nerve deficit. Coordination normal.  Skin: Skin is warm and dry. No rash noted. No erythema. No pallor.  Small fresh scar in the upper left groin, likely caused by a nick while scratching.  Psychiatric: He has a normal mood and affect. His behavior is normal. Thought content normal.  Memory loss.   No visits with results within 2 Month(s) from this visit. Latest known visit with results is:  Office Visit on 09/01/2012  Component Date Value Range Status  . Vitamin B-12 09/01/2012 320  211 - 946 pg/mL Final  . Folate 09/01/2012 >19.9  >3.0 ng/mL Final   Comment: A serum folate concentration of less than 3.1 ng/mL is                          considered to represent clinical deficiency.  . Glucose 09/01/2012 89  65 - 99 mg/dL Final  . BUN 91/47/8295 8  8 - 27 mg/dL Final  . Creatinine, Ser 09/01/2012 1.12  0.76 - 1.27 mg/dL Final  . GFR calc non Af Amer 09/01/2012 64  >59 mL/min/1.73 Final  . GFR calc Af Amer 09/01/2012 74  >59 mL/min/1.73 Final  . BUN/Creatinine Ratio 09/01/2012 7* 10 - 22 Final  . Sodium 09/01/2012 140  134 - 144 mmol/L Final  . Potassium 09/01/2012 4.4  3.5 - 5.2 mmol/L Final  . Chloride 09/01/2012 102  97 - 108 mmol/L Final  . CO2 09/01/2012 28  19 - 28 mmol/L Final  . Calcium 09/01/2012 10.0  8.6 - 10.2 mg/dL Final  . Total Protein 09/01/2012 6.5  6.0 - 8.5 g/dL Final  . Albumin 62/13/0865 4.5  3.5 - 4.8 g/dL Final  . Globulin, Total 09/01/2012 2.0  1.5 - 4.5 g/dL Final  . Albumin/Globulin Ratio 09/01/2012 2.3  1.1 - 2.5 Final  . Total Bilirubin 09/01/2012 0.7  0.0 - 1.2 mg/dL Final  . Alkaline Phosphatase 09/01/2012 86  39 - 117 IU/L Final  . AST 09/01/2012 19  0 - 40 IU/L Final  . ALT 09/01/2012 18  0 - 44 IU/L Final       Assessment & Plan:  ALZHEIMERS DISEASE: slowly progressing  Essential hypertension, benign: controlled  Mixed hyperlipidemia:  controlled  Metatarsalgia of both feet: lengthy discussion about metatarsal pas and arch support.  Unspecified hereditary and idiopathic peripheral neuropathy: numb feeling in the legs  Bradycardia: stable  Chronic insomnia - Plan: Nocturnal polysomnography (NPSG)  CAD: asymptomatic. Appt with cardiologist already scheduled for Aug 2014.

## 2012-11-03 NOTE — Patient Instructions (Addendum)
You have metatarsalgia and a high arch. Invest in new tennis shoes and inserts for your day shoes.

## 2012-11-19 ENCOUNTER — Other Ambulatory Visit: Payer: Self-pay | Admitting: Internal Medicine

## 2012-12-03 ENCOUNTER — Ambulatory Visit (INDEPENDENT_AMBULATORY_CARE_PROVIDER_SITE_OTHER): Payer: Medicare Other | Admitting: Cardiovascular Disease

## 2012-12-03 ENCOUNTER — Ambulatory Visit (HOSPITAL_COMMUNITY): Payer: Medicare Other | Attending: Cardiovascular Disease | Admitting: Radiology

## 2012-12-03 ENCOUNTER — Encounter: Payer: Self-pay | Admitting: Cardiovascular Disease

## 2012-12-03 VITALS — BP 124/71 | HR 52 | Ht 71.0 in | Wt 173.0 lb

## 2012-12-03 DIAGNOSIS — R011 Cardiac murmur, unspecified: Secondary | ICD-10-CM | POA: Insufficient documentation

## 2012-12-03 DIAGNOSIS — I10 Essential (primary) hypertension: Secondary | ICD-10-CM

## 2012-12-03 DIAGNOSIS — G309 Alzheimer's disease, unspecified: Secondary | ICD-10-CM

## 2012-12-03 DIAGNOSIS — I252 Old myocardial infarction: Secondary | ICD-10-CM | POA: Insufficient documentation

## 2012-12-03 DIAGNOSIS — I340 Nonrheumatic mitral (valve) insufficiency: Secondary | ICD-10-CM

## 2012-12-03 DIAGNOSIS — I059 Rheumatic mitral valve disease, unspecified: Secondary | ICD-10-CM

## 2012-12-03 DIAGNOSIS — R002 Palpitations: Secondary | ICD-10-CM

## 2012-12-03 DIAGNOSIS — R0600 Dyspnea, unspecified: Secondary | ICD-10-CM

## 2012-12-03 DIAGNOSIS — I2510 Atherosclerotic heart disease of native coronary artery without angina pectoris: Secondary | ICD-10-CM | POA: Insufficient documentation

## 2012-12-03 DIAGNOSIS — E785 Hyperlipidemia, unspecified: Secondary | ICD-10-CM | POA: Insufficient documentation

## 2012-12-03 DIAGNOSIS — I359 Nonrheumatic aortic valve disorder, unspecified: Secondary | ICD-10-CM | POA: Insufficient documentation

## 2012-12-03 DIAGNOSIS — F0280 Dementia in other diseases classified elsewhere without behavioral disturbance: Secondary | ICD-10-CM

## 2012-12-03 NOTE — Assessment & Plan Note (Signed)
Avoid aricept due to relative bradycardia continue current meds as they seem to help as well as the limitation of ETOH

## 2012-12-03 NOTE — Progress Notes (Signed)
Echocardiogram performed.  

## 2012-12-03 NOTE — Assessment & Plan Note (Signed)
Fairly loud MR murmur not described before  F/U echo

## 2012-12-03 NOTE — Assessment & Plan Note (Signed)
Well controlled.  Continue current medications and low sodium Dash type diet.    

## 2012-12-03 NOTE — Progress Notes (Signed)
Patient ID: Phillip Larson, male   DOB: [birth year], 78 y.o.   MRN: [id number] Jovante is seen today for followup of his blood pressure. It seems to be better controlled on Hyzaar. He has been more active. He is watching the salt in his diet. He also has hypercholesterolemia Labs 10/23/11 reviewed Cr 1.22, normal LFTS and LDL 91 HDL 56 TC 202 He has CAD with a total RCA that is collaterized. He had a low risk myovue in 2009 . He is not having any chest pain PND or orthopnea. He says Dr. Lenoria Farrier. Chilton Si is also notices blood pressure improved on Hyzaar. interestingly today his Alzheimer's disease also seems improved. He was much more coherent and talkative.He is active walking and playing tennis   Confirms occasional wine and no bourban every night anymore.  Sees Art Chilton Si as primary for lab work  ROS: Denies fever, malais, weight loss, blurry vision, decreased visual acuity, cough, sputum, SOB, hemoptysis, pleuritic pain, palpitaitons, heartburn, abdominal pain, melena, lower extremity edema, claudication, or rash.  All other systems reviewed and negative  General: Affect appropriate Healthy:  appears stated age HEENT: normal Neck supple with no adenopathy JVP normal no bruits no thyromegaly Lungs clear with no wheezing and good diaphragmatic motion Heart:  S1/S2 fairly loud MR murmur, no rub, gallop or click PMI normal Abdomen: benighn, BS positve, no tenderness, no AAA no bruit.  No HSM or HJR Distal pulses intact with no bruits No edema Neuro non-focal Skin warm and dry No muscular weakness   Current Outpatient Prescriptions  Medication Sig Dispense Refill  . ALPRAZolam (XANAX) 0.5 MG tablet Take one half to one tablet by mouth if needed for anxiety.  30 tablet  5  . aspirin 81 MG tablet Take 81 mg by mouth daily.      [name] BYSTOLIC 5 MG tablet Take 1 tablet by mouth Daily.      . cholecalciferol (VITAMIN D) 1000 UNITS tablet Take 1,000 Units by mouth daily.      [name] docusate sodium (COLACE) 100 MG  capsule Take 100 mg by mouth as needed.      . finasteride (PROSCAR) 5 MG tablet One daily to help reduce prostate size  30 tablet  5  . galantamine (RAZADYNE ER) 24 MG 24 hr capsule TAKE ONE CAPSULE EVERY DAY TO HELP PRESERVE MEMORY  30 capsule  5  . levothyroxine (SYNTHROID, LEVOTHROID) 75 MCG tablet Take 1 tablet by mouth Daily.      [name] lovastatin (MEVACOR) 40 MG tablet Take one tablet once a day to lower cholesterol  30 tablet  5  . Memantine HCl ER (NAMENDA XR) 28 MG CP24 Take 28 mg by mouth once.  30 capsule  6  . Multiple Vitamin (MULTIVITAMIN) tablet Take 1 tablet by mouth daily. Take one tablet once a day      . Omega-3 Fatty Acids (FISH OIL) 1000 MG CAPS Take by mouth. Take one tablet once a day      . omeprazole (PRILOSEC) 40 MG capsule Take 40 mg by mouth daily.      . Tamsulosin HCl (FLOMAX) 0.4 MG CAPS Take 1 tablet by mouth Daily.      . traZODone (DESYREL) 50 MG tablet Take one tablet at bedtime to help with sleep  30 tablet  5  . zolpidem (AMBIEN) 10 MG tablet Take one tablet once at bedtime as needed for rest       No current facility-administered medications for this visit.  Allergies  Iodine and Iohexol  Electrocardiogram:  SR rate 52  Old IMI no change from 2013  Assessment and Plan

## 2012-12-03 NOTE — Assessment & Plan Note (Signed)
Cholesterol is at goal.  Continue current dose of statin and diet Rx.  No myalgias or side effects.  F/U  LFT's in 6 months. No results found for this basename: LDLCALC   Labs with Art Chilton Si

## 2012-12-03 NOTE — Patient Instructions (Addendum)
The current medical regimen is effective;  continue present plan and medications.  Your physician has requested that you have an echocardiogram. Echocardiography is a painless test that uses sound waves to create images of your heart. It provides your doctor with information about the size and shape of your heart and how well your heart's chambers and valves are working. This procedure takes approximately one hour. There are no restrictions for this procedure.  Follow up in 1 year with Dr Eden Emms.  You will receive a letter in the mail 2 months before you are due.  Please call us when you receive this letter to schedule your follow up appointment.

## 2012-12-03 NOTE — Assessment & Plan Note (Signed)
Stable with no angina and good activity level.  Continue medical Rx Active with no symptoms so does not need ETT

## 2012-12-16 ENCOUNTER — Ambulatory Visit (INDEPENDENT_AMBULATORY_CARE_PROVIDER_SITE_OTHER): Payer: Medicare Other | Admitting: Internal Medicine

## 2012-12-16 ENCOUNTER — Encounter: Payer: Self-pay | Admitting: Internal Medicine

## 2012-12-16 VITALS — BP 120/60 | HR 54 | Temp 98.4°F | Resp 18 | Ht 71.0 in | Wt 173.4 lb

## 2012-12-16 DIAGNOSIS — D171 Benign lipomatous neoplasm of skin and subcutaneous tissue of trunk: Secondary | ICD-10-CM

## 2012-12-16 DIAGNOSIS — D1739 Benign lipomatous neoplasm of skin and subcutaneous tissue of other sites: Secondary | ICD-10-CM

## 2012-12-16 NOTE — Patient Instructions (Addendum)
I think you have a lipoma. The other less likely diagnosis would be a ventral hernia. There is no need for treatment at this time.

## 2012-12-16 NOTE — Progress Notes (Signed)
Subjective:    Patient ID: Phillip Larson, male    DOB: 05/31/1934, 78 y.o.   MRN: [id number]  HPI Noted a lump in the upper right abdomen under the ribs about 10 days ago. It is painless. He has no other lumps under the skin.  Current Outpatient Prescriptions on File Prior to Visit  Medication Sig Dispense Refill  . ALPRAZolam (XANAX) 0.5 MG tablet Take one half to one tablet by mouth if needed for anxiety.  30 tablet  5  . aspirin 81 MG tablet Take 81 mg by mouth daily.      [name] BYSTOLIC 5 MG tablet Take 1 tablet by mouth Daily.      . cholecalciferol (VITAMIN D) 1000 UNITS tablet Take 1,000 Units by mouth daily.      [name] docusate sodium (COLACE) 100 MG capsule Take 100 mg by mouth as needed.      . finasteride (PROSCAR) 5 MG tablet One daily to help reduce prostate size  30 tablet  5  . galantamine (RAZADYNE ER) 24 MG 24 hr capsule TAKE ONE CAPSULE EVERY DAY TO HELP PRESERVE MEMORY  30 capsule  5  . levothyroxine (SYNTHROID, LEVOTHROID) 75 MCG tablet Take 1 tablet by mouth Daily.      [name] lovastatin (MEVACOR) 40 MG tablet Take one tablet once a day to lower cholesterol  30 tablet  5  . Memantine HCl ER (NAMENDA XR) 28 MG CP24 Take 28 mg by mouth once.  30 capsule  6  . Multiple Vitamin (MULTIVITAMIN) tablet Take 1 tablet by mouth daily. Take one tablet once a day      . Omega-3 Fatty Acids (FISH OIL) 1000 MG CAPS Take by mouth. Take one tablet once a day      . omeprazole (PRILOSEC) 40 MG capsule Take 40 mg by mouth daily.      . Tamsulosin HCl (FLOMAX) 0.4 MG CAPS Take 1 tablet by mouth Daily.      . traZODone (DESYREL) 50 MG tablet Take one tablet at bedtime to help with sleep  30 tablet  5  . zolpidem (AMBIEN) 10 MG tablet Take one tablet once at bedtime as needed for rest       No current facility-administered medications on file prior to visit.    Review of Systems  Constitutional: Negative.   HENT: Negative.   Eyes: Negative.   Respiratory: Positive for shortness of breath.  Negative for cough, choking, chest tightness and wheezing.   Cardiovascular: Negative for chest pain, palpitations and leg swelling.  Gastrointestinal: Negative.   Endocrine: Negative.   Genitourinary: Positive for frequency.       Incontinent of urine at night Sometimes loses control at night. Hx BPH.  Using tamsulosin.  Musculoskeletal: Positive for arthralgias.  Neurological: Negative for tremors, syncope, weakness and headaches.       Memory loss.  Hematological: Negative.   Psychiatric/Behavioral: Positive for sleep disturbance.       Objective:BP 120/60  Pulse 54  Temp(Src) 98.4 F (36.9 C) (Oral)  Resp 18  Ht 5\' 11"  (1.803 m)  Wt 173 lb 6.4 oz (78.654 kg)  BMI 24.2 kg/m2  SpO2 96%    Physical Exam  Constitutional: He appears well-developed and well-nourished. No distress.  HENT:  Head: Normocephalic and atraumatic.  Right Ear: External ear normal.  Left Ear: External ear normal.  Nose: Nose normal.  Eyes: Conjunctivae and EOM are normal. Pupils are equal, round, and reactive to light.  Neck: Normal  range of motion. Neck supple. No JVD present. No tracheal deviation present. No thyromegaly present.  Cardiovascular: Normal rate, regular rhythm, normal heart sounds and intact distal pulses.  Exam reveals no gallop and no friction rub.   No murmur heard. Pulmonary/Chest: Effort normal and breath sounds normal. No respiratory distress. He has no wheezes. He has no rales. He exhibits no tenderness.  Abdominal: Soft. Bowel sounds are normal. He exhibits no distension and no mass. There is no tenderness.  Long-term right upper abdomen and that is unlikely to be a ventral hernia, but I cannot fully rule this out.  Musculoskeletal: Normal range of motion. He exhibits no edema and no tenderness.  Lymphadenopathy:    He has no cervical adenopathy.  Neurological: No cranial nerve deficit. Coordination normal.  Skin: Skin is warm and dry. No rash noted. No erythema. No pallor.   Area suspicious for lipoma under the ribs and the right upper abdomen.  Psychiatric: He has a normal mood and affect. His behavior is normal. Thought content normal.  Memory loss.      No visits with results within 3 Month(s) from this visit. Latest known visit with results is:  Office Visit on 09/01/2012  Component Date Value Range Status  . Vitamin B-12 09/01/2012 320  211 - 946 pg/mL Final  . Folate 09/01/2012 >19.9  >3.0 ng/mL Final   Comment: A serum folate concentration of less than 3.1 ng/mL is                          considered to represent clinical deficiency.  . Glucose 09/01/2012 89  65 - 99 mg/dL Final  . BUN 29/56/2130 8  8 - 27 mg/dL Final  . Creatinine, Ser 09/01/2012 1.12  0.76 - 1.27 mg/dL Final  . GFR calc non Af Amer 09/01/2012 64  >59 mL/min/1.73 Final  . GFR calc Af Amer 09/01/2012 74  >59 mL/min/1.73 Final  . BUN/Creatinine Ratio 09/01/2012 7* 10 - 22 Final  . Sodium 09/01/2012 140  134 - 144 mmol/L Final  . Potassium 09/01/2012 4.4  3.5 - 5.2 mmol/L Final  . Chloride 09/01/2012 102  97 - 108 mmol/L Final  . CO2 09/01/2012 28  19 - 28 mmol/L Final  . Calcium 09/01/2012 10.0  8.6 - 10.2 mg/dL Final  . Total Protein 09/01/2012 6.5  6.0 - 8.5 g/dL Final  . Albumin 86/57/8469 4.5  3.5 - 4.8 g/dL Final  . Globulin, Total 09/01/2012 2.0  1.5 - 4.5 g/dL Final  . Albumin/Globulin Ratio 09/01/2012 2.3  1.1 - 2.5 Final  . Total Bilirubin 09/01/2012 0.7  0.0 - 1.2 mg/dL Final  . Alkaline Phosphatase 09/01/2012 86  39 - 117 IU/L Final  . AST 09/01/2012 19  0 - 40 IU/L Final  . ALT 09/01/2012 18  0 - 44 IU/L Final       Assessment & Plan:  Lipoma of abdominal wall: Patient reassured this is a benign problem. There is no need for biopsy or surgical removal at this time.

## 2012-12-19 ENCOUNTER — Other Ambulatory Visit: Payer: Self-pay | Admitting: Internal Medicine

## 2012-12-31 ENCOUNTER — Other Ambulatory Visit: Payer: Self-pay | Admitting: Internal Medicine

## 2013-01-05 ENCOUNTER — Other Ambulatory Visit: Payer: Self-pay | Admitting: Internal Medicine

## 2013-01-16 ENCOUNTER — Emergency Department (HOSPITAL_COMMUNITY): Payer: Medicare Other

## 2013-01-16 ENCOUNTER — Emergency Department (HOSPITAL_COMMUNITY)
Admission: EM | Admit: 2013-01-16 | Discharge: 2013-01-17 | Disposition: A | Discharge status: Home Health | Payer: Medicare Other | Attending: Emergency Medicine | Admitting: Emergency Medicine

## 2013-01-16 DIAGNOSIS — Z8711 Personal history of peptic ulcer disease: Secondary | ICD-10-CM | POA: Insufficient documentation

## 2013-01-16 DIAGNOSIS — E782 Mixed hyperlipidemia: Secondary | ICD-10-CM | POA: Insufficient documentation

## 2013-01-16 DIAGNOSIS — Z8679 Personal history of other diseases of the circulatory system: Secondary | ICD-10-CM | POA: Insufficient documentation

## 2013-01-16 DIAGNOSIS — I160 Hypertensive urgency: Secondary | ICD-10-CM

## 2013-01-16 DIAGNOSIS — Z8739 Personal history of other diseases of the musculoskeletal system and connective tissue: Secondary | ICD-10-CM | POA: Insufficient documentation

## 2013-01-16 DIAGNOSIS — Z872 Personal history of diseases of the skin and subcutaneous tissue: Secondary | ICD-10-CM | POA: Insufficient documentation

## 2013-01-16 DIAGNOSIS — M1990 Unspecified osteoarthritis, unspecified site: Secondary | ICD-10-CM | POA: Insufficient documentation

## 2013-01-16 DIAGNOSIS — IMO0001 Reserved for inherently not codable concepts without codable children: Secondary | ICD-10-CM | POA: Insufficient documentation

## 2013-01-16 DIAGNOSIS — F329 Major depressive disorder, single episode, unspecified: Secondary | ICD-10-CM | POA: Insufficient documentation

## 2013-01-16 DIAGNOSIS — I498 Other specified cardiac arrhythmias: Secondary | ICD-10-CM | POA: Insufficient documentation

## 2013-01-16 DIAGNOSIS — R0600 Dyspnea, unspecified: Secondary | ICD-10-CM

## 2013-01-16 DIAGNOSIS — Z7982 Long term (current) use of aspirin: Secondary | ICD-10-CM | POA: Insufficient documentation

## 2013-01-16 DIAGNOSIS — Z79899 Other long term (current) drug therapy: Secondary | ICD-10-CM | POA: Insufficient documentation

## 2013-01-16 DIAGNOSIS — E559 Vitamin D deficiency, unspecified: Secondary | ICD-10-CM | POA: Insufficient documentation

## 2013-01-16 DIAGNOSIS — G4700 Insomnia, unspecified: Secondary | ICD-10-CM | POA: Insufficient documentation

## 2013-01-16 DIAGNOSIS — Z8669 Personal history of other diseases of the nervous system and sense organs: Secondary | ICD-10-CM | POA: Insufficient documentation

## 2013-01-16 DIAGNOSIS — R0989 Other specified symptoms and signs involving the circulatory and respiratory systems: Secondary | ICD-10-CM | POA: Insufficient documentation

## 2013-01-16 DIAGNOSIS — F3289 Other specified depressive episodes: Secondary | ICD-10-CM | POA: Insufficient documentation

## 2013-01-16 DIAGNOSIS — R0609 Other forms of dyspnea: Secondary | ICD-10-CM | POA: Insufficient documentation

## 2013-01-16 DIAGNOSIS — G309 Alzheimer's disease, unspecified: Secondary | ICD-10-CM | POA: Insufficient documentation

## 2013-01-16 DIAGNOSIS — F411 Generalized anxiety disorder: Secondary | ICD-10-CM | POA: Insufficient documentation

## 2013-01-16 DIAGNOSIS — I10 Essential (primary) hypertension: Secondary | ICD-10-CM | POA: Insufficient documentation

## 2013-01-16 DIAGNOSIS — E039 Hypothyroidism, unspecified: Secondary | ICD-10-CM | POA: Insufficient documentation

## 2013-01-16 DIAGNOSIS — K2100 Gastro-esophageal reflux disease with esophagitis, without bleeding: Secondary | ICD-10-CM | POA: Insufficient documentation

## 2013-01-16 DIAGNOSIS — Z87891 Personal history of nicotine dependence: Secondary | ICD-10-CM | POA: Insufficient documentation

## 2013-01-16 DIAGNOSIS — I2510 Atherosclerotic heart disease of native coronary artery without angina pectoris: Secondary | ICD-10-CM | POA: Insufficient documentation

## 2013-01-16 DIAGNOSIS — N400 Enlarged prostate without lower urinary tract symptoms: Secondary | ICD-10-CM | POA: Insufficient documentation

## 2013-01-16 DIAGNOSIS — F0280 Dementia in other diseases classified elsewhere without behavioral disturbance: Secondary | ICD-10-CM | POA: Insufficient documentation

## 2013-01-16 DIAGNOSIS — Z87448 Personal history of other diseases of urinary system: Secondary | ICD-10-CM | POA: Insufficient documentation

## 2013-01-16 DIAGNOSIS — E785 Hyperlipidemia, unspecified: Secondary | ICD-10-CM | POA: Insufficient documentation

## 2013-01-16 DIAGNOSIS — R079 Chest pain, unspecified: Secondary | ICD-10-CM | POA: Insufficient documentation

## 2013-01-16 LAB — POCT I-STAT TROPONIN I: Troponin i, poc: 0 ng/mL (ref 0.00–0.08)

## 2013-01-16 LAB — CBC
Hemoglobin: 14.1 g/dL (ref 13.0–17.0)
MCH: 32.1 pg (ref 26.0–34.0)
RBC: 4.39 MIL/uL (ref 4.22–5.81)
WBC: 6.9 10*3/uL (ref 4.0–10.5)

## 2013-01-16 LAB — URINALYSIS, DIPSTICK ONLY
Bilirubin Urine: NEGATIVE
Glucose, UA: NEGATIVE mg/dL
Hgb urine dipstick: NEGATIVE
Ketones, ur: 15 mg/dL — AB
Nitrite: NEGATIVE
pH: 6 (ref 5.0–8.0)

## 2013-01-16 LAB — BASIC METABOLIC PANEL
CO2: 26 mEq/L (ref 19–32)
Calcium: 9.8 mg/dL (ref 8.4–10.5)
Chloride: 99 mEq/L (ref 96–112)
Glucose, Bld: 107 mg/dL — ABNORMAL HIGH (ref 70–99)
Sodium: 137 mEq/L (ref 135–145)

## 2013-01-16 MED ORDER — CLONIDINE HCL 0.2 MG PO TABS
0.2000 mg | ORAL_TABLET | Freq: Once | ORAL | Status: AC
  Administered 2013-01-16: 0.2 mg via ORAL
  Filled 2013-01-16: qty 1

## 2013-01-16 MED ORDER — ASPIRIN EC 325 MG PO TBEC
325.0000 mg | DELAYED_RELEASE_TABLET | Freq: Once | ORAL | Status: AC
  Administered 2013-01-16: 325 mg via ORAL
  Filled 2013-01-16: qty 1

## 2013-01-16 MED ORDER — HYDRALAZINE HCL 20 MG/ML IJ SOLN
10.0000 mg | Freq: Once | INTRAMUSCULAR | Status: DC
  Filled 2013-01-16: qty 1

## 2013-01-16 MED ORDER — NITROGLYCERIN 0.4 MG SL SUBL
0.4000 mg | SUBLINGUAL_TABLET | SUBLINGUAL | Status: DC | PRN
Start: 2013-01-16 — End: 2013-01-17

## 2013-01-16 MED ORDER — NITROGLYCERIN 0.4 MG SL SUBL
SUBLINGUAL_TABLET | SUBLINGUAL | Status: AC
  Filled 2013-01-16: qty 25

## 2013-01-16 MED ORDER — NITROGLYCERIN 0.4 MG SL SUBL
0.4000 mg | SUBLINGUAL_TABLET | SUBLINGUAL | Status: DC | PRN
  Administered 2013-01-16: 0.4 mg via SUBLINGUAL

## 2013-01-16 NOTE — ED Notes (Signed)
Wife at bedside. Pt states he feels good now.  BP is elevated. States he has taken all of his medicines as normal

## 2013-01-16 NOTE — ED Notes (Signed)
Pt works as Ship broker at [employer] and had a synopal episode while working, also had episode of shob and chest pain. All symptoms resolved on arrival to ED pt took his own baby asa earlier today

## 2013-01-16 NOTE — ED Notes (Signed)
Pt denies pain but states he feels slightly shob prefers to sit straight up

## 2013-01-16 NOTE — ED Provider Notes (Signed)
CSN: [id number]     Arrival date & time 01/16/13  2038 History   None    Chief Complaint  Patient presents with  . Respiratory Distress  . Chest Pain   (Consider location/radiation/quality/duration/timing/severity/associated sxs/prior Treatment) Patient is a 78 y.o. male presenting with shortness of breath.  Shortness of Breath Severity:  Moderate Onset quality:  Gradual Duration:  3 hours Timing:  Constant Progression:  Worsening Chronicity:  Recurrent Context: activity   Relieved by:  Rest Worsened by:  Nothing tried Ineffective treatments:  None tried Associated symptoms: no abdominal pain, no chest pain, no cough, no fever, no headaches, no hemoptysis, no rash, no sore throat and no vomiting     Past Medical History  Diagnosis Date  . Mixed hyperlipidemia   . ALZHEIMERS DISEASE   . Essential hypertension, benign   . CAD   . BENIGN PROSTATIC HYPERTROPHY, HX OF   . Impacted cerumen   . Anxiety state, unspecified   . Pressure ulcer   . Actinic keratosis   . Enthesopathy of hip region   . Unspecified hereditary and idiopathic peripheral neuropathy   . Benign neoplasm of colon   . Unspecified vitamin D deficiency   . Other premature beats   . Diverticulosis of colon (without mention of hemorrhage)   . Unspecified hypothyroidism   . Coronary atherosclerosis of native coronary artery   . Urinary obstruction, unspecified   . Actinic keratosis   . Pain in joint, site unspecified   . Osteoarthrosis, unspecified whether generalized or localized, unspecified site   . Peptic ulcer, unspecified site, unspecified as acute or chronic, without mention of hemorrhage, perforation, or obstruction   . Other and unspecified hyperlipidemia   . Alzheimer's disease   . Myalgia and myositis, unspecified   . Insomnia, unspecified   . Other malaise and fatigue   . External hemorrhoids without mention of complication   . Anal fissure   . Other psoriasis   . Unspecified essential  hypertension   . Depressive disorder, not elsewhere classified   . Cerebrovascular disease, unspecified   . Acute, but ill-defined, cerebrovascular disease   . Reflux esophagitis   . Pain in joint, lower leg   . Palpitations   . Shortness of breath   . Coronary atherosclerosis of native coronary artery   . Impacted cerumen [id number]  . Pressure ulcer of buttock, unstageable   . Unspecified hereditary and idiopathic peripheral neuropathy   . Urinary obstruction, unspecified   . Actinic keratosis   . Osteoarthrosis, unspecified whether generalized or localized, unspecified site   . Peptic ulcer, unspecified site, unspecified as acute or chronic, without mention of hemorrhage, perforation, or obstruction   . Myalgia and myositis, unspecified   . Insomnia, unspecified   . Other malaise and fatigue   . External hemorrhoids without mention of complication   . Anal fissure   . Depressive disorder, not elsewhere classified   . Cerebrovascular disease, unspecified   . Acute, but ill-defined, cerebrovascular disease   . Palpitations    Past Surgical History  Procedure Laterality Date  . Knee surgery  07/18/2006    Dr Madelon Lips   . Cervical disc surgery    . Laminectomy c3 disc  1972  . Retinal tear repair cryotherapy      Dr Marion Downer   . Multiple tooth extractions  2014   Family History  Problem Relation Age of Onset  . Cancer Father     pancreatic  . Cancer Mother  pancreatic   History  Substance Use Topics  . Smoking status: Former Smoker -- 2.00 packs/day for 25 years    Types: Cigarettes    Quit date: 04/22/1992  . Smokeless tobacco: Never Used  . Alcohol Use: Yes     Comment: beer/wine daily    Review of Systems  Constitutional: Negative for fever and chills.  HENT: Negative for congestion, sore throat and rhinorrhea.   Eyes: Negative for photophobia and visual disturbance.  Respiratory: Positive for shortness of breath. Negative for cough and hemoptysis.    Cardiovascular: Negative for chest pain and leg swelling.  Gastrointestinal: Negative for nausea, vomiting, abdominal pain, diarrhea and constipation.  Endocrine: Negative for polydipsia and polyuria.  Genitourinary: Negative for dysuria and hematuria.  Musculoskeletal: Negative for back pain and arthralgias.  Skin: Negative for color change and rash.  Neurological: Negative for dizziness, syncope, light-headedness and headaches.  Hematological: Negative for adenopathy. Does not bruise/bleed easily.  All other systems reviewed and are negative.    Allergies  Iodine and Iohexol  Home Medications   Current Outpatient Rx  Name  Route  Sig  Dispense  Refill  . ALPRAZolam (XANAX) 0.5 MG tablet   Oral   Take 0.5 mg by mouth at bedtime.         [name] aspirin EC 81 MG tablet   Oral   Take 81 mg by mouth at bedtime.         [name] b complex vitamins tablet   Oral   Take 1 tablet by mouth daily.         . Calcium Carbonate-Vitamin D (CALCIUM 600+D PO)   Oral   Take 1 tablet by mouth at bedtime.         . cholecalciferol (VITAMIN D) 1000 UNITS tablet   Oral   Take 1,000 Units by mouth daily.         [name] docusate sodium (COLACE) 100 MG capsule   Oral   Take 100 mg by mouth 2 (two) times daily.          . finasteride (PROSCAR) 5 MG tablet   Oral   Take 5 mg by mouth daily.         [name] galantamine (RAZADYNE ER) 24 MG 24 hr capsule   Oral   Take 24 mg by mouth daily with breakfast.         . lovastatin (MEVACOR) 40 MG tablet   Oral   Take 40 mg by mouth at bedtime.         . Memantine HCl ER (NAMENDA XR) 28 MG CP24   Oral   Take 28 mg by mouth daily.         . Multiple Vitamin (MULTIVITAMIN WITH MINERALS) TABS tablet   Oral   Take 1 tablet by mouth daily.         . nebivolol (BYSTOLIC) 5 MG tablet   Oral   Take 5 mg by mouth daily.         . Omega-3 Fatty Acids (FISH OIL) 1000 MG CAPS   Oral   Take 1,000 mg by mouth at bedtime.          [name]  omeprazole (PRILOSEC) 40 MG capsule   Oral   Take 40 mg by mouth daily.         . tamsulosin (FLOMAX) 0.4 MG CAPS capsule   Oral   Take 0.4 mg by mouth daily.         [name]  traZODone (DESYREL) 50 MG tablet   Oral   Take 50 mg by mouth at bedtime.         [name] zinc gluconate 50 MG tablet   Oral   Take 50 mg by mouth at bedtime.         [name] zolpidem (AMBIEN) 10 MG tablet   Oral   Take 10 mg by mouth at bedtime.         [name] levothyroxine (SYNTHROID, LEVOTHROID) 75 MCG tablet   Oral   Take 75 mcg by mouth daily.          [name] losartan-hydrochlorothiazide (HYZAAR) 50-12.5 MG per tablet   Oral   Take 1 tablet by mouth daily.   30 tablet   0    BP 100/58  Pulse 48  Temp(Src) 99 F (37.2 C) (Oral)  Resp 15  SpO2 96% Physical Exam  Vitals reviewed. Constitutional: He is oriented to person, place, and time. He appears well-developed and well-nourished.  HENT:  Head: Normocephalic and atraumatic.  Eyes: Conjunctivae and EOM are normal.  Neck: Normal range of motion. Neck supple.  Cardiovascular: Regular rhythm and normal heart sounds.  Bradycardia present.   Pulmonary/Chest: Effort normal. No respiratory distress. He has rales in the right lower field and the left lower field.  Abdominal: He exhibits no distension. There is no tenderness. There is no rebound and no guarding.  Musculoskeletal: Normal range of motion.  Neurological: He is alert and oriented to person, place, and time.  Skin: Skin is warm and dry.    ED Course  Procedures (including critical care time) Labs Review Labs Reviewed  BASIC METABOLIC PANEL - Abnormal; Notable for the following:    Glucose, Bld 107 (*)    GFR calc non Af Amer 54 (*)    GFR calc Af Amer 63 (*)    All other components within normal limits  URINALYSIS, DIPSTICK ONLY - Abnormal; Notable for the following:    Ketones, ur 15 (*)    Leukocytes, UA SMALL (*)    All other components within normal limits  PRO B NATRIURETIC PEPTIDE   CBC  CALCIUM, IONIZED  PTH, INTACT AND CALCIUM  POCT I-STAT TROPONIN I   Results for orders placed during the hospital encounter of 01/16/13  PRO B NATRIURETIC PEPTIDE      Result Value Range   Pro B Natriuretic peptide (BNP) 326.5  0 - 450 pg/mL  CBC      Result Value Range   WBC 6.9  4.0 - 10.5 K/uL   RBC 4.39  4.22 - 5.81 MIL/uL   Hemoglobin 14.1  13.0 - 17.0 g/dL   HCT 16.1  09.6 - 04.5 %   MCV 89.5  78.0 - 100.0 fL   MCH 32.1  26.0 - 34.0 pg   MCHC 35.9  30.0 - 36.0 g/dL   RDW 40.9  81.1 - 91.4 %   Platelets 200  150 - 400 K/uL  BASIC METABOLIC PANEL      Result Value Range   Sodium 137  135 - 145 mEq/L   Potassium 4.0  3.5 - 5.1 mEq/L   Chloride 99  96 - 112 mEq/L   CO2 26  19 - 32 mEq/L   Glucose, Bld 107 (*) 70 - 99 mg/dL   BUN 13  6 - 23 mg/dL   Creatinine, Ser 7.82  0.50 - 1.35 mg/dL   Calcium 9.8  8.4 - 95.6 mg/dL   GFR calc non Af  Amer 54 (*) >90 mL/min   GFR calc Af Amer 63 (*) >90 mL/min  URINALYSIS, DIPSTICK ONLY      Result Value Range   Specific Gravity, Urine 1.007  1.005 - 1.030   pH 6.0  5.0 - 8.0   Glucose, UA NEGATIVE  NEGATIVE mg/dL   Hgb urine dipstick NEGATIVE  NEGATIVE   Bilirubin Urine NEGATIVE  NEGATIVE   Ketones, ur 15 (*) NEGATIVE mg/dL   Protein, ur NEGATIVE  NEGATIVE mg/dL   Urobilinogen, UA 0.2  0.0 - 1.0 mg/dL   Nitrite NEGATIVE  NEGATIVE   Leukocytes, UA SMALL (*) NEGATIVE  POCT I-STAT TROPONIN I      Result Value Range   Troponin i, poc 0.00  0.00 - 0.08 ng/mL   Comment 3             Imaging Review Dg Chest 2 View  01/16/2013   *RADIOLOGY REPORT*  Clinical Data: Respiratory distress and chest pain  CHEST - 2 VIEW  Comparison: 03/13/2012  Findings: Cardiac silhouette is normal in size and configuration. The aorta is mildly uncoiled.  The lungs are clear.  No pleural effusion or pneumothorax.  The bony thorax is demineralized but intact.  IMPRESSION: No acute cardiopulmonary disease.   Original Report Authenticated By: Amie Portland, M.D.    Date: 01/17/2013  Rate: 53  Rhythm: sinus bradycardia  QRS Axis: normal  Intervals: normal  ST/T Wave abnormalities: STE in v2-v3 with LVH  Conduction Disutrbances:none  Narrative Interpretation:   Old EKG Reviewed: New LVH and STE   MDM   1. Dyspnea   2. Hypertensive urgency    78 y.o. male  with pertinent PMH of CAD, alzheimers presents with dyspnea beginning shortly prior to arrival while working and walking up multiple flights of stairs.  Pt has had 4 similar episodes over the last month and had an echo done in august which demonstrated mild aortic stenosis and diastolic CHF.  He was in his normal state of health prior to work today.  He denies chest pain, pressure, or other symptoms including fever, cough, nausea, vomiting, or diarrhea.  Physical exam demonstrated bibasilar rales, however no pedal edema or other concerning findings.  ECG on arrival as above, with new ST elevation in v2-v3 and LVH.  Pt hypertensive to 224/91.  Given SL nitro and clonidine and had improvement in BP over next 2 hours to normotension.  Cardiology consulted, feel this likely secondary to hypertension and recommended dc home and starting hyzaar.  Pt given standard return precautions for chest pain, dyspnea.  Also discussed borderline high ca and possibility of hyperparathyroidism.  Labs and imaging as above reviewed by myself and attending,Dr. Radford Pax, with whom case was discussed.   1. Dyspnea   2. Hypertensive urgency         Noel Gerold, MD 01/17/13 0045

## 2013-01-16 NOTE — ED Notes (Signed)
MD at bedside. 

## 2013-01-16 NOTE — ED Notes (Signed)
Urine collected and sent. Pt talking with wife

## 2013-01-16 NOTE — ED Notes (Signed)
Patient transported to X-ray 

## 2013-01-17 DIAGNOSIS — D171 Benign lipomatous neoplasm of skin and subcutaneous tissue of trunk: Secondary | ICD-10-CM | POA: Insufficient documentation

## 2013-01-17 LAB — POCT I-STAT TROPONIN I: Troponin i, poc: 0.01 ng/mL (ref 0.00–0.08)

## 2013-01-17 MED ORDER — LOSARTAN POTASSIUM-HCTZ 50-12.5 MG PO TABS: 1.0000 | ORAL_TABLET | Freq: Every day | ORAL | Status: DC

## 2013-01-17 NOTE — ED Provider Notes (Addendum)
I saw and evaluated the patient, reviewed the resident's note and I agree with the findings and plan.   .Face to face Exam:  General:  Awake HEENT:  Atraumatic Resp:  Normal effort Abd:  Nondistended Neuro:No focal weakness    I reviewed and agree with the EKG interpretation.    Nelia Shi, MD 02/08/13 [phone]

## 2013-01-17 NOTE — ED Notes (Signed)
Cards at bedside

## 2013-01-18 ENCOUNTER — Encounter: Payer: Self-pay | Admitting: Nurse Practitioner

## 2013-01-18 ENCOUNTER — Ambulatory Visit (INDEPENDENT_AMBULATORY_CARE_PROVIDER_SITE_OTHER): Payer: Medicare Other | Admitting: Nurse Practitioner

## 2013-01-18 VITALS — BP 122/64 | HR 55 | Temp 98.3°F | Resp 14 | Ht 71.0 in | Wt 171.0 lb

## 2013-01-18 DIAGNOSIS — R0609 Other forms of dyspnea: Secondary | ICD-10-CM

## 2013-01-18 DIAGNOSIS — H6122 Impacted cerumen, left ear: Secondary | ICD-10-CM

## 2013-01-18 DIAGNOSIS — R0600 Dyspnea, unspecified: Secondary | ICD-10-CM

## 2013-01-18 DIAGNOSIS — H6120 Impacted cerumen, unspecified ear: Secondary | ICD-10-CM

## 2013-01-18 DIAGNOSIS — I10 Essential (primary) hypertension: Secondary | ICD-10-CM

## 2013-01-18 LAB — PTH, INTACT AND CALCIUM
Calcium, Total (PTH): 10.2 mg/dL (ref 8.4–10.5)
PTH: 21 pg/mL (ref 14.0–72.0)

## 2013-01-18 NOTE — Progress Notes (Signed)
Patient ID: Phillip Larson, male   DOB: 1935-01-19, 78 y.o.   MRN: [id number]   Allergies  Allergen Reactions  . Iodine Swelling    Internal iodine  . Iohexol Hives         Chief Complaint  Patient presents with  . Hospitalization Follow-up    Hospital Follow up for chest pains    HPI: Patient is a 78 y.o. male seen in the office today for follow up from ED visit;  Worked at [employer] and was rushing around walking up and down steps; had increase shortness of breath with exertion. No chest pains but had chest pressure and could not get breath and called EMS was found to have hypertensive urgency and went to the hospital.  Pt reports he has had 3- 4 similar episodes since June; echo done in august which demonstrated mild aortic stenosis and diastolic CHF. Cardiology was consulted in the ED and felt this likely secondary to hypertension and recommended dc home and starting hyzaar. Pt has follow up with cardiology in 3 days. Pt reports he felt fine when he left the ED and went home and got Rx for hyzaar and reports since then he reports shortness of breath is back; feels like he can not get a good breath but is able to. He denies chest pain, cough, nausea, vomiting, or diarrhea.  Review of Systems:  Review of Systems  Constitutional: Positive for weight loss (due to not having proper teeth this was resolved). Negative for fever, chills and malaise/fatigue.  HENT: Negative for neck pain.   Respiratory: Positive for shortness of breath. Negative for cough.   Cardiovascular: Negative for chest pain, palpitations and leg swelling.  Gastrointestinal: Negative for heartburn, abdominal pain, diarrhea and constipation.  Musculoskeletal: Negative for myalgias and back pain.  Skin: Negative.   Neurological: Positive for tingling and sensory change (reports tingling in fingertips). Negative for weakness.     Past Medical History  Diagnosis Date  . Mixed hyperlipidemia   . ALZHEIMERS DISEASE    . Essential hypertension, benign   . CAD   . BENIGN PROSTATIC HYPERTROPHY, HX OF   . Impacted cerumen   . Anxiety state, unspecified   . Pressure ulcer   . Actinic keratosis   . Enthesopathy of hip region   . Unspecified hereditary and idiopathic peripheral neuropathy   . Benign neoplasm of colon   . Unspecified vitamin D deficiency   . Other premature beats   . Diverticulosis of colon (without mention of hemorrhage)   . Unspecified hypothyroidism   . Coronary atherosclerosis of native coronary artery   . Urinary obstruction, unspecified   . Actinic keratosis   . Pain in joint, site unspecified   . Osteoarthrosis, unspecified whether generalized or localized, unspecified site   . Peptic ulcer, unspecified site, unspecified as acute or chronic, without mention of hemorrhage, perforation, or obstruction   . Other and unspecified hyperlipidemia   . Alzheimer's disease   . Myalgia and myositis, unspecified   . Insomnia, unspecified   . Other malaise and fatigue   . External hemorrhoids without mention of complication   . Anal fissure   . Other psoriasis   . Unspecified essential hypertension   . Depressive disorder, not elsewhere classified   . Cerebrovascular disease, unspecified   . Acute, but ill-defined, cerebrovascular disease   . Reflux esophagitis   . Pain in joint, lower leg   . Palpitations   . Shortness of breath   .  Coronary atherosclerosis of native coronary artery   . Impacted cerumen [id number]  . Pressure ulcer of buttock, unstageable   . Unspecified hereditary and idiopathic peripheral neuropathy   . Urinary obstruction, unspecified   . Actinic keratosis   . Osteoarthrosis, unspecified whether generalized or localized, unspecified site   . Peptic ulcer, unspecified site, unspecified as acute or chronic, without mention of hemorrhage, perforation, or obstruction   . Myalgia and myositis, unspecified   . Insomnia, unspecified   . Other malaise and fatigue   .  External hemorrhoids without mention of complication   . Anal fissure   . Depressive disorder, not elsewhere classified   . Cerebrovascular disease, unspecified   . Acute, but ill-defined, cerebrovascular disease   . Palpitations    Past Surgical History  Procedure Laterality Date  . Knee surgery  07/18/2006    Dr Madelon Lips   . Cervical disc surgery    . Laminectomy c3 disc  1972  . Retinal tear repair cryotherapy      Dr Marion Downer   . Multiple tooth extractions  2014   Social History:   reports that he quit smoking about 20 years ago. His smoking use included Cigarettes. He has a 50 pack-year smoking history. He has never used smokeless tobacco. He reports that  drinks alcohol. He reports that he does not use illicit drugs.  Family History  Problem Relation Age of Onset  . Cancer Father     pancreatic  . Cancer Mother     pancreatic    Medications: Patient's Medications  New Prescriptions   No medications on file  Previous Medications   ALPRAZOLAM (XANAX) 0.5 MG TABLET    Take 0.5 mg by mouth at bedtime.   ASPIRIN EC 81 MG TABLET    Take 81 mg by mouth at bedtime.   B COMPLEX VITAMINS TABLET    Take 1 tablet by mouth daily.   CALCIUM CARBONATE-VITAMIN D (CALCIUM 600+D PO)    Take 1 tablet by mouth at bedtime.   CHOLECALCIFEROL (VITAMIN D) 1000 UNITS TABLET    Take 1,000 Units by mouth daily.   DOCUSATE SODIUM (COLACE) 100 MG CAPSULE    Take 100 mg by mouth 2 (two) times daily.    FINASTERIDE (PROSCAR) 5 MG TABLET    Take 5 mg by mouth daily.   GALANTAMINE (RAZADYNE ER) 24 MG 24 HR CAPSULE    Take 24 mg by mouth daily with breakfast.   LEVOTHYROXINE (SYNTHROID, LEVOTHROID) 75 MCG TABLET    Take 75 mcg by mouth daily.    LOSARTAN-HYDROCHLOROTHIAZIDE (HYZAAR) 50-12.5 MG PER TABLET    Take 1 tablet by mouth daily.   LOVASTATIN (MEVACOR) 40 MG TABLET    Take 40 mg by mouth at bedtime.   MEMANTINE HCL ER (NAMENDA XR) 28 MG CP24    Take 28 mg by mouth daily.   MULTIPLE VITAMIN  (MULTIVITAMIN WITH MINERALS) TABS TABLET    Take 1 tablet by mouth daily.   NEBIVOLOL (BYSTOLIC) 5 MG TABLET    Take 5 mg by mouth daily.   OMEGA-3 FATTY ACIDS (FISH OIL) 1000 MG CAPS    Take 1,000 mg by mouth at bedtime.    OMEPRAZOLE (PRILOSEC) 40 MG CAPSULE    Take 40 mg by mouth daily.   TAMSULOSIN (FLOMAX) 0.4 MG CAPS CAPSULE    Take 0.4 mg by mouth daily.   TRAZODONE (DESYREL) 50 MG TABLET    Take 50 mg by mouth at bedtime.  ZINC GLUCONATE 50 MG TABLET    Take 50 mg by mouth at bedtime.   ZOLPIDEM (AMBIEN) 10 MG TABLET    Take 10 mg by mouth at bedtime.  Modified Medications   No medications on file  Discontinued Medications   No medications on file     Physical Exam:  Filed Vitals:   01/18/13 1607  BP: 122/64  Pulse: 55  Temp: 98.3 F (36.8 C)  TempSrc: Oral  Resp: 14  Height: 5\' 11"  (1.803 m)  Weight: 171 lb (77.565 kg)    Physical Exam  Vitals reviewed. Constitutional: He is oriented to person, place, and time and well-developed, well-nourished, and in no distress. No distress.  HENT:  Head: Normocephalic and atraumatic.  Mouth/Throat: Oropharynx is clear and moist. No oropharyngeal exudate.  Eyes: Conjunctivae and EOM are normal. Pupils are equal, round, and reactive to light.  Neck: Normal range of motion. Neck supple. No JVD present. No thyromegaly present.  Cardiovascular: Normal rate, regular rhythm and normal heart sounds.   Pulmonary/Chest: Effort normal and breath sounds normal. No respiratory distress. He has no wheezes. He has no rales. He exhibits no tenderness.  Abdominal: Soft. Bowel sounds are normal. He exhibits no distension. There is no tenderness.  Lymphadenopathy:    He has no cervical adenopathy.  Neurological: He is alert and oriented to person, place, and time.  Skin: Skin is warm and dry. He is not diaphoretic.    Labs reviewed: Basic Metabolic Panel:  Recent Labs  78/29/56 1457 01/16/13 2124 01/16/13 2205  NA 140 137  --   K 4.4  4.0  --   CL 102 99  --   CO2 28 26  --   GLUCOSE 89 107*  --   BUN 8 13  --   CREATININE 1.12 1.25  --   CALCIUM 10.0 9.8 10.2   Liver Function Tests:  Recent Labs  09/01/12 1457  AST 19  ALT 18  ALKPHOS 86  BILITOT 0.7  PROT 6.5   No results found for this basename: LIPASE, AMYLASE,  in the last 8760 hours No results found for this basename: AMMONIA,  in the last 8760 hours CBC:  Recent Labs  01/16/13 2124  WBC 6.9  HGB 14.1  HCT 39.3  MCV 89.5  PLT 200   Lipid Panel: No results found for this basename: CHOL, HDL, LDLCALC, TRIG, CHOLHDL, LDLDIRECT,  in the last 8760 hours   Assessment/Plan 1. Essential hypertension, benign -improved on current medications; will cont these at this time to follow up with cardiologist  - to take bp and hr twice daily and record  - EKG 12-Lead  2. Dyspnea -stable at this time -given warning signs for when to seek emergency care  3. Cerumen impaction Gus Puma done to left ear with good results   4. hypercalcinemia  Pts ionized calcium slightly elevated in ED taking calcium supplement to stop at this time and will  follow up calcium

## 2013-01-18 NOTE — Patient Instructions (Addendum)
Take blood pressure and heart rate twice daily; bring this log to your cardiology appt Stop taking calcium with vit D Keep on Vit D supplement only Do not hesitate to go back to the emergency room if you are having chest pain with shortness of breath *warning signs*  Chest Pain (Nonspecific) It is often hard to give a specific diagnosis for the cause of chest pain. There is always a chance that your pain could be related to something serious, such as a heart attack or a blood clot in the lungs. You need to follow up with your caregiver for further evaluation. CAUSES   Heartburn.  Pneumonia or bronchitis.  Anxiety or stress.  Inflammation around your heart (pericarditis) or lung (pleuritis or pleurisy).  A blood clot in the lung.  A collapsed lung (pneumothorax). It can develop suddenly on its own (spontaneous pneumothorax) or from injury (trauma) to the chest.  Shingles infection (herpes zoster virus). The chest wall is composed of bones, muscles, and cartilage. Any of these can be the source of the pain.  The bones can be bruised by injury.  The muscles or cartilage can be strained by coughing or overwork.  The cartilage can be affected by inflammation and become sore (costochondritis). DIAGNOSIS  Lab tests or other studies, such as X-rays, electrocardiography, stress testing, or cardiac imaging, may be needed to find the cause of your pain.  TREATMENT   Treatment depends on what may be causing your chest pain. Treatment may include:  Acid blockers for heartburn.  Anti-inflammatory medicine.  Pain medicine for inflammatory conditions.  Antibiotics if an infection is present.  You may be advised to change lifestyle habits. This includes stopping smoking and avoiding alcohol, caffeine, and chocolate.  You may be advised to keep your head raised (elevated) when sleeping. This reduces the chance of acid going backward from your stomach into your esophagus.  Most of the  time, nonspecific chest pain will improve within 2 to 3 days with rest and mild pain medicine. HOME CARE INSTRUCTIONS   If antibiotics were prescribed, take your antibiotics as directed. Finish them even if you start to feel better.  For the next few days, avoid physical activities that bring on chest pain. Continue physical activities as directed.  Do not smoke.  Avoid drinking alcohol.  Only take over-the-counter or prescription medicine for pain, discomfort, or fever as directed by your caregiver.  Follow your caregiver's suggestions for further testing if your chest pain does not go away.  Keep any follow-up appointments you made. If you do not go to an appointment, you could develop lasting (chronic) problems with pain. If there is any problem keeping an appointment, you must call to reschedule. SEEK MEDICAL CARE IF:   You think you are having problems from the medicine you are taking. Read your medicine instructions carefully.  Your chest pain does not go away, even after treatment.  You develop a rash with blisters on your chest. SEEK IMMEDIATE MEDICAL CARE IF:   You have increased chest pain or pain that spreads to your arm, neck, jaw, back, or abdomen.  You develop shortness of breath, an increasing cough, or you are coughing up blood.  You have severe back or abdominal pain, feel nauseous, or vomit.  You develop severe weakness, fainting, or chills.  You have a fever. THIS IS AN EMERGENCY. Do not wait to see if the pain will go away. Get medical help at once. Call your local emergency services (911 in  [place] Do not drive yourself to the hospital. MAKE SURE YOU:   Understand these instructions.  Will watch your condition.  Will get help right away if you are not doing well or get worse. Document Released: 01/16/2005 Document Revised: 07/01/2011 Document Reviewed: 11/12/2007 [facility] Patient Information 2014 [place], [place].

## 2013-01-19 ENCOUNTER — Telehealth: Payer: Self-pay | Admitting: *Deleted

## 2013-01-19 NOTE — Telephone Encounter (Signed)
Received faxed EKG, from yesterday, on patient that showed sinus bradycardia with HR 45 bpm. This is not a change from previous EKGs. Candelaria Celeste NP at [facility] has viewed this EKG. Patient has appointment with Tereso Newcomer PA on Friday 10/3. Will forward this to his assist Danielle Rankin.

## 2013-01-22 ENCOUNTER — Encounter: Payer: Medicare Other | Admitting: Physician Assistant

## 2013-01-22 ENCOUNTER — Ambulatory Visit (INDEPENDENT_AMBULATORY_CARE_PROVIDER_SITE_OTHER): Payer: Medicare Other | Admitting: Cardiovascular Disease

## 2013-01-22 ENCOUNTER — Encounter: Payer: Self-pay | Admitting: *Deleted

## 2013-01-22 ENCOUNTER — Encounter: Payer: Self-pay | Admitting: Cardiovascular Disease

## 2013-01-22 VITALS — BP 112/62 | HR 48 | Ht 71.0 in | Wt 169.0 lb

## 2013-01-22 DIAGNOSIS — I2510 Atherosclerotic heart disease of native coronary artery without angina pectoris: Secondary | ICD-10-CM

## 2013-01-22 DIAGNOSIS — I10 Essential (primary) hypertension: Secondary | ICD-10-CM

## 2013-01-22 DIAGNOSIS — Z01810 Encounter for preprocedural cardiovascular examination: Secondary | ICD-10-CM

## 2013-01-22 DIAGNOSIS — E782 Mixed hyperlipidemia: Secondary | ICD-10-CM

## 2013-01-22 DIAGNOSIS — R079 Chest pain, unspecified: Secondary | ICD-10-CM

## 2013-01-22 LAB — CBC WITH DIFFERENTIAL/PLATELET
Basophils Relative: 0.3 % (ref 0.0–3.0)
HCT: 45.1 % (ref 39.0–52.0)
Hemoglobin: 15.4 g/dL (ref 13.0–17.0)
Lymphocytes Relative: 17.6 % (ref 12.0–46.0)
Lymphs Abs: 1.4 10*3/uL (ref 0.7–4.0)
MCHC: 34.2 g/dL (ref 30.0–36.0)
Monocytes Relative: 7.4 % (ref 3.0–12.0)
Neutro Abs: 5.7 10*3/uL (ref 1.4–7.7)
Platelets: 225 10*3/uL (ref 150.0–400.0)
RBC: 4.87 Mil/uL (ref 4.22–5.81)

## 2013-01-22 LAB — BASIC METABOLIC PANEL
BUN: 17 mg/dL (ref 6–23)
CO2: 30 mEq/L (ref 19–32)
Chloride: 102 mEq/L (ref 96–112)
Creatinine, Ser: 1.5 mg/dL (ref 0.4–1.5)
Potassium: 4.6 mEq/L (ref 3.5–5.1)

## 2013-01-22 LAB — PROTIME-INR: INR: 1 ratio (ref 0.8–1.0)

## 2013-01-22 MED ORDER — DILTIAZEM HCL ER COATED BEADS 120 MG PO CP24: 120.0000 mg | ORAL_CAPSULE | Freq: Every day | ORAL | Status: DC

## 2013-01-22 MED ORDER — DIPHENHYDRAMINE HCL 25 MG PO TABS: ORAL_TABLET | ORAL | Status: DC

## 2013-01-22 MED ORDER — PREDNISONE 20 MG PO TABS: ORAL_TABLET | ORAL | Status: DC

## 2013-01-22 NOTE — Progress Notes (Signed)
Patient ID: Phillip Larson, male   DOB: 1935-04-10, 78 y.o.   MRN: [id number] Phillip Larson is seen today for followup of his blood pressure.. He has been more active. He is watching the salt in his diet. He also has hypercholesterolemia Labs 10/23/11 reviewed Cr 1.22, normal LFTS and LDL 91 HDL 56 TC 202 He has CAD with a total RCA that is collaterized. He had a low risk myovue in 2009 .  Seen in ER over weekend with exertional dyspnea and chest pain  Has had multiple episodes of exertional pressure and dyspnea Relief with rest In ER BP was high R/O Started back on Hyzaar but feels this makes him weak.  Has been bradycardia No rest pain but pressure fairly reproducable with exertion.     ROS: Denies fever, malais, weight loss, blurry vision, decreased visual acuity, cough, sputum, SOB, hemoptysis, pleuritic pain, palpitaitons, heartburn, abdominal pain, melena, lower extremity edema, claudication, or rash.  All other systems reviewed and negative  General: Affect appropriate Healthy:  appears stated age HEENT: normal Neck supple with no adenopathy JVP normal no bruits no thyromegaly Lungs clear with no wheezing and good diaphragmatic motion Heart:  S1/S2 no murmur, no rub, gallop or click PMI normal Abdomen: benighn, BS positve, no tenderness, no AAA no bruit.  No HSM or HJR Distal pulses intact with no bruits No edema Neuro non-focal Skin warm and dry No muscular weakness   Current Outpatient Prescriptions  Medication Sig Dispense Refill  . ALPRAZolam (XANAX) 0.5 MG tablet Take 0.5 mg by mouth at bedtime.      [name] aspirin EC 81 MG tablet Take 81 mg by mouth at bedtime.      [name] b complex vitamins tablet Take 1 tablet by mouth daily.      . Calcium Carbonate-Vitamin D (CALCIUM 600+D PO) Take 1 tablet by mouth at bedtime.      . cholecalciferol (VITAMIN D) 1000 UNITS tablet Take 1,000 Units by mouth daily.      [name] docusate sodium (COLACE) 100 MG capsule Take 100 mg by mouth 2 (two) times daily.        . finasteride (PROSCAR) 5 MG tablet Take 5 mg by mouth daily.      [name] galantamine (RAZADYNE ER) 24 MG 24 hr capsule Take 24 mg by mouth daily with breakfast.      . levothyroxine (SYNTHROID, LEVOTHROID) 75 MCG tablet Take 75 mcg by mouth daily.       [name] losartan-hydrochlorothiazide (HYZAAR) 50-12.5 MG per tablet Take 1 tablet by mouth daily.  30 tablet  0  . lovastatin (MEVACOR) 40 MG tablet Take 40 mg by mouth at bedtime.      . Memantine HCl ER (NAMENDA XR) 28 MG CP24 Take 28 mg by mouth daily.      . Multiple Vitamin (MULTIVITAMIN WITH MINERALS) TABS tablet Take 1 tablet by mouth daily.      . nebivolol (BYSTOLIC) 5 MG tablet Take 5 mg by mouth daily.      . Omega-3 Fatty Acids (FISH OIL) 1000 MG CAPS Take 1,000 mg by mouth at bedtime.       [name] omeprazole (PRILOSEC) 40 MG capsule Take 40 mg by mouth daily.      . tamsulosin (FLOMAX) 0.4 MG CAPS capsule Take 0.4 mg by mouth daily.      . traZODone (DESYREL) 50 MG tablet Take 50 mg by mouth at bedtime.      [name] zinc gluconate 50 MG tablet Take  50 mg by mouth at bedtime.      [name] zolpidem (AMBIEN) 10 MG tablet Take 10 mg by mouth at bedtime.       No current facility-administered medications for this visit.    Allergies  Iodine and Iohexol  Electrocardiogram:  SR rate 53  PVC  Q lead 3 no acute ST changes   Assessment and Plan

## 2013-01-22 NOTE — Assessment & Plan Note (Addendum)
Known CAD wit total collateralized RCA  Increasing angina  Recommended diagnostic heart cath. Patient agrees  Scheduled with labs and orders Done.  Radial case if possible  Labs today  Risks including stroke, dye reaction, bleeding and need for emergency surgery discussed Willing to proceed Will premedicate for dye allergy

## 2013-01-22 NOTE — Assessment & Plan Note (Signed)
Well controlled.  Continue current medications and low sodium Dash type diet.   He feels that Hyzaar is too strong and is bradycardic  D/C Hyzaar and bystolic Start cardizem

## 2013-01-22 NOTE — Assessment & Plan Note (Signed)
Cholesterol is at goal.  Continue current dose of statin and diet Rx.  No myalgias or side effects.  F/U  LFT's in 6 months. No results found for this basename: LDLCALC             

## 2013-01-22 NOTE — Patient Instructions (Addendum)
Your physician has requested that you have a cardiac catheterization. Cardiac catheterization is used to diagnose and/or treat various heart conditions. Doctors may recommend this procedure for a number of different reasons. The most common reason is to evaluate chest pain. Chest pain can be a symptom of coronary artery disease (CAD), and cardiac catheterization can show whether plaque is narrowing or blocking your heart's arteries. This procedure is also used to evaluate the valves, as well as measure the blood flow and oxygen levels in different parts of your heart. For further information please visit [email or web address] Please follow instruction sheet, as given.   Your physician recommends that you return for lab work in:  TODAY  BMET  CBC WITH DIFF  INR  Your physician has recommended you make the following change in your medication: STOP   HYZAAR AND  BYSTOLIC\ START   CARDIZEM  120 MG  1   EVERY DAY

## 2013-01-25 ENCOUNTER — Other Ambulatory Visit: Payer: Self-pay | Admitting: Cardiovascular Disease

## 2013-01-25 DIAGNOSIS — R079 Chest pain, unspecified: Secondary | ICD-10-CM

## 2013-01-27 NOTE — Progress Notes (Signed)
PT  AWARE./CY 

## 2013-01-27 NOTE — Progress Notes (Signed)
PT AWARE OF LAB RESULTS./CY 

## 2013-01-28 ENCOUNTER — Encounter (HOSPITAL_COMMUNITY)
Admission: RE | Disposition: A | Discharge status: Home / Self Care | Payer: Self-pay | Source: Ambulatory Visit | Attending: Cardiovascular Disease

## 2013-01-28 ENCOUNTER — Ambulatory Visit (HOSPITAL_COMMUNITY)
Admission: RE | Admit: 2013-01-28 | Discharge: 2013-01-28 | Disposition: A | Discharge status: Home / Self Care | Payer: Medicare Other | Source: Ambulatory Visit | Attending: Cardiovascular Disease | Admitting: Cardiovascular Disease

## 2013-01-28 DIAGNOSIS — R0609 Other forms of dyspnea: Secondary | ICD-10-CM | POA: Insufficient documentation

## 2013-01-28 DIAGNOSIS — Z79899 Other long term (current) drug therapy: Secondary | ICD-10-CM | POA: Insufficient documentation

## 2013-01-28 DIAGNOSIS — R079 Chest pain, unspecified: Secondary | ICD-10-CM | POA: Insufficient documentation

## 2013-01-28 DIAGNOSIS — R0989 Other specified symptoms and signs involving the circulatory and respiratory systems: Secondary | ICD-10-CM | POA: Insufficient documentation

## 2013-01-28 DIAGNOSIS — I2510 Atherosclerotic heart disease of native coronary artery without angina pectoris: Secondary | ICD-10-CM

## 2013-01-28 HISTORY — PX: LEFT HEART CATHETERIZATION WITH CORONARY ANGIOGRAM: SHX5451

## 2013-01-28 SURGERY — LEFT HEART CATHETERIZATION WITH CORONARY ANGIOGRAM
Anesthesia: LOCAL

## 2013-01-28 MED ORDER — ASPIRIN 81 MG PO CHEW
81.0000 mg | CHEWABLE_TABLET | ORAL | Status: AC
  Administered 2013-01-28: 81 mg via ORAL

## 2013-01-28 MED ORDER — HYDRALAZINE HCL 20 MG/ML IJ SOLN
INTRAMUSCULAR | Status: AC
  Filled 2013-01-28: qty 1

## 2013-01-28 MED ORDER — SODIUM CHLORIDE 0.9 % IJ SOLN: 3.0000 mL | INTRAMUSCULAR | Status: DC | PRN

## 2013-01-28 MED ORDER — DIAZEPAM 2 MG PO TABS: 2.0000 mg | ORAL_TABLET | ORAL | Status: DC | PRN

## 2013-01-28 MED ORDER — LIDOCAINE HCL (PF) 1 % IJ SOLN
INTRAMUSCULAR | Status: AC
  Filled 2013-01-28: qty 30

## 2013-01-28 MED ORDER — HEPARIN (PORCINE) IN NACL 2-0.9 UNIT/ML-% IJ SOLN
INTRAMUSCULAR | Status: AC
  Filled 2013-01-28: qty 1500

## 2013-01-28 MED ORDER — HEPARIN SODIUM (PORCINE) 1000 UNIT/ML IJ SOLN
INTRAMUSCULAR | Status: AC
  Filled 2013-01-28: qty 1

## 2013-01-28 MED ORDER — ASPIRIN EC 325 MG PO TBEC: 325.0000 mg | DELAYED_RELEASE_TABLET | Freq: Every day | ORAL | Status: DC

## 2013-01-28 MED ORDER — VERAPAMIL HCL 2.5 MG/ML IV SOLN
INTRAVENOUS | Status: AC
  Filled 2013-01-28: qty 2

## 2013-01-28 MED ORDER — OXYCODONE-ACETAMINOPHEN 5-325 MG PO TABS: 1.0000 | ORAL_TABLET | ORAL | Status: DC | PRN

## 2013-01-28 MED ORDER — SODIUM CHLORIDE 0.9 % IJ SOLN: 0.1000 mg/kg | INTRAMUSCULAR | Status: DC | PRN

## 2013-01-28 MED ORDER — SODIUM CHLORIDE 0.9 % IV SOLN: INTRAVENOUS | Status: DC

## 2013-01-28 MED ORDER — SODIUM CHLORIDE 0.9 % IV SOLN: 250.0000 mL | INTRAVENOUS | Status: DC | PRN

## 2013-01-28 MED ORDER — ONDANSETRON HCL 4 MG/2ML IJ SOLN: 4.0000 mg | Freq: Four times a day (QID) | INTRAMUSCULAR | Status: DC | PRN

## 2013-01-28 MED ORDER — SODIUM CHLORIDE 0.9 % IJ SOLN: 3.0000 mL | Freq: Two times a day (BID) | INTRAMUSCULAR | Status: DC

## 2013-01-28 MED ORDER — MIDAZOLAM HCL 2 MG/2ML IJ SOLN
INTRAMUSCULAR | Status: AC
  Filled 2013-01-28: qty 2

## 2013-01-28 MED ORDER — ACETAMINOPHEN 325 MG PO TABS: 650.0000 mg | ORAL_TABLET | ORAL | Status: DC | PRN

## 2013-01-28 MED ORDER — NITROGLYCERIN 0.2 MG/ML ON CALL CATH LAB
INTRAVENOUS | Status: AC
  Filled 2013-01-28: qty 1

## 2013-01-28 MED ORDER — SODIUM CHLORIDE 0.45 % IV SOLN
INTRAVENOUS | Status: AC
Start: 2013-01-28 — End: 2013-01-28
  Administered 2013-01-28: 10:00:00 via INTRAVENOUS

## 2013-01-28 NOTE — Interval H&P Note (Signed)
Cath Lab Visit (complete for each Cath Lab visit)  Clinical Evaluation Leading to the Procedure:   ACS: no  Non-ACS:    Anginal Classification: CCS III  Anti-ischemic medical therapy: Maximal Therapy (2 or more classes of medications)  Non-Invasive Test Results: No non-invasive testing performed  Prior CABG: No previous CABG      History and Physical Interval Note:  01/28/2013 8:44 AM  Phillip Larson  has presented today for surgery, with the diagnosis of cp  The various methods of treatment have been discussed with the patient and family. After consideration of risks, benefits and other options for treatment, the patient has consented to  Procedure(s): LEFT HEART CATHETERIZATION WITH CORONARY ANGIOGRAM (N/A) as a surgical intervention .  The patient's history has been reviewed, patient examined, no change in status, stable for surgery.  I have reviewed the patient's chart and labs.  Questions were answered to the patient's satisfaction.     Charlton Haws

## 2013-01-28 NOTE — H&P (View-Only) (Signed)
Patient ID: Phillip Larson, male   DOB: 04/27/1934, 78 y.o.   MRN: [id number] Phillip Larson is seen today for followup of his blood pressure.. He has been more active. He is watching the salt in his diet. He also has hypercholesterolemia Labs 10/23/11 reviewed Cr 1.22, normal LFTS and LDL 91 HDL 56 TC 202 He has CAD with a total RCA that is collaterized. He had a low risk myovue in 2009 .  Seen in ER over weekend with exertional dyspnea and chest pain  Has had multiple episodes of exertional pressure and dyspnea Relief with rest In ER BP was high R/O Started back on Hyzaar but feels this makes him weak.  Has been bradycardia No rest pain but pressure fairly reproducable with exertion.     ROS: Denies fever, malais, weight loss, blurry vision, decreased visual acuity, cough, sputum, SOB, hemoptysis, pleuritic pain, palpitaitons, heartburn, abdominal pain, melena, lower extremity edema, claudication, or rash.  All other systems reviewed and negative  General: Affect appropriate Healthy:  appears stated age HEENT: normal Neck supple with no adenopathy JVP normal no bruits no thyromegaly Lungs clear with no wheezing and good diaphragmatic motion Heart:  S1/S2 no murmur, no rub, gallop or click PMI normal Abdomen: benighn, BS positve, no tenderness, no AAA no bruit.  No HSM or HJR Distal pulses intact with no bruits No edema Neuro non-focal Skin warm and dry No muscular weakness   Current Outpatient Prescriptions  Medication Sig Dispense Refill  . ALPRAZolam (XANAX) 0.5 MG tablet Take 0.5 mg by mouth at bedtime.      . aspirin EC 81 MG tablet Take 81 mg by mouth at bedtime.      . b complex vitamins tablet Take 1 tablet by mouth daily.      . Calcium Carbonate-Vitamin D (CALCIUM 600+D PO) Take 1 tablet by mouth at bedtime.      . cholecalciferol (VITAMIN D) 1000 UNITS tablet Take 1,000 Units by mouth daily.      . docusate sodium (COLACE) 100 MG capsule Take 100 mg by mouth 2 (two) times daily.        . finasteride (PROSCAR) 5 MG tablet Take 5 mg by mouth daily.      . galantamine (RAZADYNE ER) 24 MG 24 hr capsule Take 24 mg by mouth daily with breakfast.      . levothyroxine (SYNTHROID, LEVOTHROID) 75 MCG tablet Take 75 mcg by mouth daily.       . losartan-hydrochlorothiazide (HYZAAR) 50-12.5 MG per tablet Take 1 tablet by mouth daily.  30 tablet  0  . lovastatin (MEVACOR) 40 MG tablet Take 40 mg by mouth at bedtime.      . Memantine HCl ER (NAMENDA XR) 28 MG CP24 Take 28 mg by mouth daily.      . Multiple Vitamin (MULTIVITAMIN WITH MINERALS) TABS tablet Take 1 tablet by mouth daily.      . nebivolol (BYSTOLIC) 5 MG tablet Take 5 mg by mouth daily.      . Omega-3 Fatty Acids (FISH OIL) 1000 MG CAPS Take 1,000 mg by mouth at bedtime.       . omeprazole (PRILOSEC) 40 MG capsule Take 40 mg by mouth daily.      . tamsulosin (FLOMAX) 0.4 MG CAPS capsule Take 0.4 mg by mouth daily.      . traZODone (DESYREL) 50 MG tablet Take 50 mg by mouth at bedtime.      . zinc gluconate 50 MG tablet Take   50 mg by mouth at bedtime.      . zolpidem (AMBIEN) 10 MG tablet Take 10 mg by mouth at bedtime.       No current facility-administered medications for this visit.    Allergies  Iodine and Iohexol  Electrocardiogram:  SR rate 53  PVC  Q lead 3 no acute ST changes   Assessment and Plan  

## 2013-01-28 NOTE — CV Procedure (Signed)
    Cardiac Catheterization Procedure Note  Name: Phillip Larson MRN: [id number] DOB: 1934/10/23  Procedure: Left Heart Cath, Selective Coronary Angiography, LV angiography  Indication:  Chest pain Known CAD with RCA occlusion collateralized in 2008   Procedural Details: The right wrist was prepped, draped, and anesthetized with 1% lidocaine. Using the modified Seldinger technique, a 5 French sheath was introduced into the right radial artery. 3 mg of verapamil was administered through the sheath, weight-based unfractionated heparin was administered intravenously. Standard Judkins catheters were used for selective coronary angiography and left ventriculography. Catheter exchanges were performed over an exchange length guidewire. There were no immediate procedural complications. A TR band was used for radial hemostasis at the completion of the procedure.  The patient was transferred to the post catheterization recovery area for further monitoring.  Procedural Findings: Hemodynamics: AO 169 77 LV  188 13 mmHg  Coronary angiography: Coronary dominance: right  Left mainstem: Short segment 20% stenosis   Left anterior descending (LAD): 30% proximal 30% mid  Small distal vessel   D1: small vessel  D2: small vessel  D3 Large and normal  Left circumflex (LCx):  40% proximal and mid vessel disease    OM1: normal  OM2: normal  Right coronary artery (RCA):  Known proximial CTO with good left to right collaterals  Left ventriculography: Left ventricular systolic function is normal 55% mild inferobasal wall hypokinesis  Final Conclusions:  1VD with known RCA occlusion Collaterals are robust with no critical left sided disease   Recommendations: Continue medical Rx  Charlton Haws 01/28/2013, 9:20 AM

## 2013-01-28 NOTE — Interval H&P Note (Signed)
Cath Lab Visit (complete for each Cath Lab visit)  Clinical Evaluation Leading to the Procedure:   ACS: no  Non-ACS:    Anginal Classification: CCS II  Anti-ischemic medical therapy: Minimal Therapy (1 class of medications)  Non-Invasive Test Results: Low-risk stress test findings: cardiac mortality <1%/year  Prior CABG: No previous CABG      History and Physical Interval Note:  01/28/2013 8:13 AM  Traci Sermon  has presented today for surgery, with the diagnosis of cp  The various methods of treatment have been discussed with the patient and family. After consideration of risks, benefits and other options for treatment, the patient has consented to  Procedure(s): LEFT HEART CATHETERIZATION WITH CORONARY ANGIOGRAM (N/A) as a surgical intervention .  The patient's history has been reviewed, patient examined, no change in status, stable for surgery.  I have reviewed the patient's chart and labs.  Questions were answered to the patient's satisfaction.     Charlton Haws

## 2013-01-28 NOTE — Interval H&P Note (Signed)
History and Physical Interval Note:  01/28/2013 8:13 AM  Phillip Larson  has presented today for surgery, with the diagnosis of cp  The various methods of treatment have been discussed with the patient and family. After consideration of risks, benefits and other options for treatment, the patient has consented to  Procedure(s): LEFT HEART CATHETERIZATION WITH CORONARY ANGIOGRAM (N/A) as a surgical intervention .  The patient's history has been reviewed, patient examined, no change in status, stable for surgery.  I have reviewed the patient's chart and labs.  Questions were answered to the patient's satisfaction.     Charlton Haws

## 2013-01-29 ENCOUNTER — Other Ambulatory Visit: Payer: Self-pay

## 2013-01-29 MED ORDER — ISOSORBIDE MONONITRATE ER 30 MG PO TB24: ORAL_TABLET | ORAL | Status: DC

## 2013-02-02 ENCOUNTER — Other Ambulatory Visit: Payer: Self-pay | Admitting: Internal Medicine

## 2013-02-03 ENCOUNTER — Encounter: Payer: Self-pay | Admitting: Internal Medicine

## 2013-02-03 ENCOUNTER — Ambulatory Visit (INDEPENDENT_AMBULATORY_CARE_PROVIDER_SITE_OTHER): Payer: Medicare Other | Admitting: Internal Medicine

## 2013-02-03 VITALS — BP 142/82 | HR 58 | Temp 97.5°F | Wt 172.8 lb

## 2013-02-03 DIAGNOSIS — R0609 Other forms of dyspnea: Secondary | ICD-10-CM

## 2013-02-03 DIAGNOSIS — R001 Bradycardia, unspecified: Secondary | ICD-10-CM

## 2013-02-03 DIAGNOSIS — I2510 Atherosclerotic heart disease of native coronary artery without angina pectoris: Secondary | ICD-10-CM

## 2013-02-03 DIAGNOSIS — F0280 Dementia in other diseases classified elsewhere without behavioral disturbance: Secondary | ICD-10-CM

## 2013-02-03 DIAGNOSIS — I10 Essential (primary) hypertension: Secondary | ICD-10-CM

## 2013-02-03 DIAGNOSIS — I498 Other specified cardiac arrhythmias: Secondary | ICD-10-CM

## 2013-02-03 DIAGNOSIS — I208 Other forms of angina pectoris: Secondary | ICD-10-CM

## 2013-02-03 DIAGNOSIS — E782 Mixed hyperlipidemia: Secondary | ICD-10-CM

## 2013-02-03 DIAGNOSIS — Z87898 Personal history of other specified conditions: Secondary | ICD-10-CM

## 2013-02-03 DIAGNOSIS — R0600 Dyspnea, unspecified: Secondary | ICD-10-CM

## 2013-02-03 MED ORDER — LOVASTATIN 40 MG PO TABS: 40.0000 mg | ORAL_TABLET | Freq: Every day | ORAL | Status: DC

## 2013-02-03 MED ORDER — FINASTERIDE 5 MG PO TABS: 5.0000 mg | ORAL_TABLET | Freq: Every day | ORAL | Status: DC

## 2013-02-03 MED ORDER — NITROGLYCERIN 0.4 MG SL SUBL: SUBLINGUAL_TABLET | SUBLINGUAL | Status: DC

## 2013-02-03 NOTE — Patient Instructions (Signed)
Continue medications as listed 

## 2013-02-03 NOTE — Progress Notes (Signed)
Subjective:    Patient ID: Phillip Larson, male    DOB: [birth year], 78 y.o.   MRN: [id number]  Chief Complaint  Patient presents with  . Medical Managment of Chronic Issues    4 month f/u  . other    discuss BP issues & heart Cath done 0n 10/9. records in system  . other    RT ear itches him bad    HPI Episode of rapid heart rate and dyspnea while working at [employer] on 01/17/13. Was sent to ER via ambulance. BP very high (SBP 212). BP brought back under control.  Saw DR.Nishan on 01/22/13. Medications were adjusted. He says he is uncomfortable on the diltiazem. Complains of intermittent paresthesias of the fingers.   Had 2D echo in August 2014. Heart cath done 01/28/13. There is evidence of CAD and of mild Aortic Stenosis.  Home BP continues to be mostly normal, but there are frequent rises in the SBP and occasional rises in DBP. He is unable to relate this to stress or any other factor.  Mixed hyperlipidemia : controlled on lovastatin (MEVACOR) 40 MG tablet  Essential hypertension, benign:see above  Bradycardia: rate 58 today  ALZHEIMERS DISEASE: unchanged, mild memory issues.  BENIGN PROSTATIC HYPERTROPHY, HX OF: improved symptoms on finasteride (PROSCAR) 5 MG tablet  Angina decubitus : Unclear if the dyspnea and upper chest feeling is angina.  CAD - Plan: Ambulatory referral to Cardiology  Dyspnea: normal at rest, but exertion sometimes seems to cause an upper chest feeling that is accompanied by dyspnea. Denies Pain in the chest.    Current Outpatient Prescriptions on File Prior to Visit  Medication Sig Dispense Refill  . ALPRAZolam (XANAX) 0.5 MG tablet TAKE 1/2 TO 1 TABLET BY MOUTH IF NEEDED FOR ANXIETY  30 tablet  1  . aspirin EC 81 MG tablet Take 81 mg by mouth at bedtime.      [name] b complex vitamins tablet Take 1 tablet by mouth daily.      . cholecalciferol (VITAMIN D) 1000 UNITS tablet Take 1,000 Units by mouth daily.      [name] diltiazem (CARDIZEM CD) 120 MG 24 hr  capsule Take 1 capsule (120 mg total) by mouth daily.  90 capsule  3  . docusate sodium (COLACE) 100 MG capsule Take 100 mg by mouth 2 (two) times daily.       [name] galantamine (RAZADYNE ER) 24 MG 24 hr capsule Take 24 mg by mouth daily with breakfast.      . isosorbide mononitrate (IMDUR) 30 MG 24 hr tablet Take 15 mg daily cut the 30 mg in half  90 tablet  3  . levothyroxine (SYNTHROID, LEVOTHROID) 75 MCG tablet Take 75 mcg by mouth daily.       . Memantine HCl ER (NAMENDA XR) 28 MG CP24 Take 28 mg by mouth daily.      . Multiple Vitamin (MULTIVITAMIN WITH MINERALS) TABS tablet Take 1 tablet by mouth daily.      . Omega-3 Fatty Acids (FISH OIL) 1000 MG CAPS Take 1,000 mg by mouth at bedtime.       [name] omeprazole (PRILOSEC) 40 MG capsule Take 40 mg by mouth daily.      . tamsulosin (FLOMAX) 0.4 MG CAPS capsule Take 0.4 mg by mouth daily.      . traZODone (DESYREL) 50 MG tablet Take 50 mg by mouth at bedtime.      [name] zinc gluconate 50 MG tablet Take 50 mg by mouth  at bedtime.      [name] zolpidem (AMBIEN) 10 MG tablet Take 10 mg by mouth at bedtime.       No current facility-administered medications on file prior to visit.    Review of Systems  Constitutional: Negative.   HENT: Negative.   Eyes: Negative.   Respiratory: Positive for shortness of breath. Negative for cough, choking, chest tightness and wheezing.   Cardiovascular: Negative for chest pain, palpitations and leg swelling.  Gastrointestinal: Negative.   Endocrine: Negative.   Genitourinary: Positive for frequency.       Incontinent of urine at night Sometimes loses control at night. Hx BPH.  Using tamsulosin and finasteride.  Musculoskeletal: Positive for arthralgias.  Neurological: Negative for tremors, syncope, weakness and headaches.       Memory loss.  Hematological: Negative.   Psychiatric/Behavioral: Positive for sleep disturbance.       Objective:BP 142/82  Pulse 58  Temp(Src) 97.5 F (36.4 C) (Oral)  Wt 172 lb 12.8 oz  (78.382 kg)  BMI 24.11 kg/m2  SpO2 97%    Physical Exam  Constitutional: He appears well-developed and well-nourished. No distress.  HENT:  Head: Normocephalic and atraumatic.  Right Ear: External ear normal.  Left Ear: External ear normal.  Nose: Nose normal.  Eyes: Conjunctivae and EOM are normal. Pupils are equal, round, and reactive to light.  Neck: Normal range of motion. Neck supple. No JVD present. No tracheal deviation present. No thyromegaly present.  Cardiovascular: Normal rate, regular rhythm, normal heart sounds and intact distal pulses.  Exam reveals no gallop and no friction rub.   No murmur heard. Pulmonary/Chest: Effort normal and breath sounds normal. No respiratory distress. He has no wheezes. He has no rales. He exhibits no tenderness.  Abdominal: Soft. Bowel sounds are normal. He exhibits no distension and no mass. There is no tenderness.  Long-term right upper abdomen lump that seems most consistent with lipoma and that is unlikely to be a ventral hernia, but I cannot fully rule this out.  Musculoskeletal: Normal range of motion. He exhibits no edema and no tenderness.  Lymphadenopathy:    He has no cervical adenopathy.  Neurological: No cranial nerve deficit. Coordination normal.  Skin: Skin is warm and dry. No rash noted. No erythema. No pallor.  Area suspicious for lipoma under the ribs and the right upper abdomen.  Psychiatric: He has a normal mood and affect. His behavior is normal. Thought content normal.  Memory loss. Increased anxiety. Not depressed.      Office Visit on 01/22/2013  Component Date Value Range Status  . WBC 01/22/2013 8.2  4.5 - 10.5 K/uL Final  . RBC 01/22/2013 4.87  4.22 - 5.81 Mil/uL Final  . Hemoglobin 01/22/2013 15.4  13.0 - 17.0 g/dL Final  . HCT 47/82/9562 45.1  39.0 - 52.0 % Final  . MCV 01/22/2013 92.5  78.0 - 100.0 fl Final  . MCHC 01/22/2013 34.2  30.0 - 36.0 g/dL Final  . RDW 13/11/6576 13.1  11.5 - 14.6 % Final  .  Platelets 01/22/2013 225.0  150.0 - 400.0 K/uL Final  . Neutrophils Relative % 01/22/2013 70.3  43.0 - 77.0 % Final  . Lymphocytes Relative 01/22/2013 17.6  12.0 - 46.0 % Final  . Monocytes Relative 01/22/2013 7.4  3.0 - 12.0 % Final  . Eosinophils Relative 01/22/2013 4.4  0.0 - 5.0 % Final  . Basophils Relative 01/22/2013 0.3  0.0 - 3.0 % Final  . Neutro Abs 01/22/2013 5.7  1.4 -  7.7 K/uL Final  . Lymphs Abs 01/22/2013 1.4  0.7 - 4.0 K/uL Final  . Monocytes Absolute 01/22/2013 0.6  0.1 - 1.0 K/uL Final  . Eosinophils Absolute 01/22/2013 0.4  0.0 - 0.7 K/uL Final  . Basophils Absolute 01/22/2013 0.0  0.0 - 0.1 K/uL Final  . Sodium 01/22/2013 141  135 - 145 mEq/L Final  . Potassium 01/22/2013 4.6  3.5 - 5.1 mEq/L Final  . Chloride 01/22/2013 102  96 - 112 mEq/L Final  . CO2 01/22/2013 30  19 - 32 mEq/L Final  . Glucose, Bld 01/22/2013 107* 70 - 99 mg/dL Final  . BUN 09/81/1914 17  6 - 23 mg/dL Final  . Creatinine, Ser 01/22/2013 1.5  0.4 - 1.5 mg/dL Final  . Calcium 78/29/5621 10.7* 8.4 - 10.5 mg/dL Final  . GFR 30/86/5784 50.25* >60.00 mL/min Final  . Prothrombin Time 01/22/2013 10.5  10.2 - 12.4 sec Final  . INR 01/22/2013 1.0  0.8 - 1.0 ratio Final  Admission on 01/16/2013, Discharged on 01/17/2013  Component Date Value Range Status  . Pro B Natriuretic peptide (BNP) 01/16/2013 326.5  0 - 450 pg/mL Final  . WBC 01/16/2013 6.9  4.0 - 10.5 K/uL Final  . RBC 01/16/2013 4.39  4.22 - 5.81 MIL/uL Final  . Hemoglobin 01/16/2013 14.1  13.0 - 17.0 g/dL Final  . HCT 69/62/9528 39.3  39.0 - 52.0 % Final  . MCV 01/16/2013 89.5  78.0 - 100.0 fL Final  . MCH 01/16/2013 32.1  26.0 - 34.0 pg Final  . MCHC 01/16/2013 35.9  30.0 - 36.0 g/dL Final  . RDW 41/32/4401 12.4  11.5 - 15.5 % Final  . Platelets 01/16/2013 200  150 - 400 K/uL Final  . Sodium 01/16/2013 137  135 - 145 mEq/L Final  . Potassium 01/16/2013 4.0  3.5 - 5.1 mEq/L Final  . Chloride 01/16/2013 99  96 - 112 mEq/L Final  . CO2  01/16/2013 26  19 - 32 mEq/L Final  . Glucose, Bld 01/16/2013 107* 70 - 99 mg/dL Final  . BUN 02/72/5366 13  6 - 23 mg/dL Final  . Creatinine, Ser 01/16/2013 1.25  0.50 - 1.35 mg/dL Final  . Calcium 44/06/4740 9.8  8.4 - 10.5 mg/dL Final  . GFR calc non Af Amer 01/16/2013 54* >90 mL/min Final  . GFR calc Af Amer 01/16/2013 63* >90 mL/min Final   Comment: (NOTE)                          The eGFR has been calculated using the CKD EPI equation.                          This calculation has not been validated in all clinical situations.                          eGFR's persistently <90 mL/min signify possible Chronic Kidney                          Disease.  [name] Specific Gravity, Urine 01/16/2013 1.007  1.005 - 1.030 Final  . pH 01/16/2013 6.0  5.0 - 8.0 Final  . Glucose, UA 01/16/2013 NEGATIVE  NEGATIVE mg/dL Final  . Hgb urine dipstick 01/16/2013 NEGATIVE  NEGATIVE Final  . Bilirubin Urine 01/16/2013 NEGATIVE  NEGATIVE Final  . Ketones, ur 01/16/2013 15*  NEGATIVE mg/dL Final  . Protein, ur 91/47/8295 NEGATIVE  NEGATIVE mg/dL Final  . Urobilinogen, UA 01/16/2013 0.2  0.0 - 1.0 mg/dL Final  . Nitrite 62/13/0865 NEGATIVE  NEGATIVE Final  . Leukocytes, UA 01/16/2013 SMALL* NEGATIVE Final  . Calcium, Ion 01/16/2013 1.37* 1.13 - 1.30 mmol/L Final   Performed at [facility]  . PTH 01/16/2013 21.0  14.0 - 72.0 pg/mL Final  . Calcium, Total (PTH) 01/16/2013 10.2  8.4 - 10.5 mg/dL Final   Performed at [facility]  . Troponin i, poc 01/16/2013 0.00  0.00 - 0.08 ng/mL Final  . Comment 3 01/16/2013          Final   Comment: Due to the release kinetics of cTnI,                          a negative result within the first hours                          of the onset of symptoms does not rule out                          myocardial infarction with certainty.                          If myocardial infarction is still suspected,                          repeat the test at appropriate  intervals.  . Troponin i, poc 01/17/2013 0.01  0.00 - 0.08 ng/mL Final  . Comment 3 01/17/2013          Final   Comment: Due to the release kinetics of cTnI,                          a negative result within the first hours                          of the onset of symptoms does not rule out                          myocardial infarction with certainty.                          If myocardial infarction is still suspected,                          repeat the test at appropriate intervals.       Assessment & Plan:  Mixed hyperlipidemia - Plan: lovastatin (MEVACOR) 40 MG tablet  Essential hypertension, benign: continue current medication   Bradycardia: mild and of uncertain clinica significance  ALZHEIMERS DISEASE: unchanged  BENIGN PROSTATIC HYPERTROPHY, HX OF - Plan: finasteride (PROSCAR) 5 MG tablet  Angina decubitus - Plan: nitroGLYCERIN (NITROSTAT) 0.4 MG SL tablet, Ambulatory referral to Cardiology  CAD - Plan: Ambulatory referral to Cardiology  Dyspnea: May be related to cardiac issues.  Anxiety about his BP and heart has increased. If this persists, I would resume Zoloft

## 2013-02-19 ENCOUNTER — Ambulatory Visit (INDEPENDENT_AMBULATORY_CARE_PROVIDER_SITE_OTHER): Payer: Medicare Other | Admitting: Cardiovascular Disease

## 2013-02-19 VITALS — BP 140/68 | HR 82 | Ht 71.0 in | Wt 171.0 lb

## 2013-02-19 DIAGNOSIS — I2510 Atherosclerotic heart disease of native coronary artery without angina pectoris: Secondary | ICD-10-CM

## 2013-02-19 DIAGNOSIS — R001 Bradycardia, unspecified: Secondary | ICD-10-CM

## 2013-02-19 DIAGNOSIS — E782 Mixed hyperlipidemia: Secondary | ICD-10-CM

## 2013-02-19 DIAGNOSIS — I498 Other specified cardiac arrhythmias: Secondary | ICD-10-CM

## 2013-02-19 DIAGNOSIS — I10 Essential (primary) hypertension: Secondary | ICD-10-CM

## 2013-02-19 MED ORDER — DILTIAZEM HCL ER COATED BEADS 240 MG PO CP24: 240.0000 mg | ORAL_CAPSULE | Freq: Every day | ORAL | Status: DC

## 2013-02-19 NOTE — Assessment & Plan Note (Signed)
Stable no indication for pacer Avoid beta blockers

## 2013-02-19 NOTE — Patient Instructions (Addendum)
Your physician wants you to follow-up in:    6 MONTHS  WITH DR Haywood Filler will receive a reminder letter in the mail two months in advance. If you don't receive a letter, please call our office to schedule the follow-up appointment. Your physician has recommended you make the following change in your medication: INCREASE CARDIZEM TO  240 MG

## 2013-02-19 NOTE — Progress Notes (Signed)
Patient ID: Phillip Larson, male   DOB: 05-07-1934, 78 y.o.   MRN: [id number] Phillip Larson is seen today for followup of his blood pressure.. He has been more active. He is watching the salt in his diet. He also has hypercholesterolemia Labs 10/23/11 reviewed Cr 1.22, normal LFTS and LDL 91 HDL 56 TC 202 He has CAD with a total RCA that is collaterized. He had a low risk myovue in 2009 .   SSCP increased earlier this month Cath done 10/9 from right radial approach and no changes with chronically occluded RCA , robust collaterals and no significant left sided disease  ROS: Denies fever, malais, weight loss, blurry vision, decreased visual acuity, cough, sputum, SOB, hemoptysis, pleuritic pain, palpitaitons, heartburn, abdominal pain, melena, lower extremity edema, claudication, or rash.  All other systems reviewed and negative  General: Affect appropriate Healthy:  appears stated age HEENT: normal Neck supple with no adenopathy JVP normal no bruits no thyromegaly Lungs clear with no wheezing and good diaphragmatic motion Heart:  S1/S2 no murmur, no rub, gallop or click PMI normal Abdomen: benighn, BS positve, no tenderness, no AAA no bruit.  No HSM or HJR Distal pulses intact with no bruits No edema Neuro non-focal Skin warm and dry No muscular weakness   Current Outpatient Prescriptions  Medication Sig Dispense Refill  . ALPRAZolam (XANAX) 0.5 MG tablet TAKE 1/2 TO 1 TABLET BY MOUTH IF NEEDED FOR ANXIETY  30 tablet  1  . aspirin EC 81 MG tablet Take 81 mg by mouth at bedtime.      [name] b complex vitamins tablet Take 1 tablet by mouth daily.      . cholecalciferol (VITAMIN D) 1000 UNITS tablet Take 1,000 Units by mouth daily.      [name] diltiazem (CARDIZEM CD) 120 MG 24 hr capsule Take 1 capsule (120 mg total) by mouth daily.  90 capsule  3  . docusate sodium (COLACE) 100 MG capsule Take 100 mg by mouth 2 (two) times daily.       . finasteride (PROSCAR) 5 MG tablet Take 1 tablet (5 mg total) by mouth  daily.  30 tablet  5  . galantamine (RAZADYNE ER) 24 MG 24 hr capsule Take 24 mg by mouth daily with breakfast.      . isosorbide mononitrate (IMDUR) 30 MG 24 hr tablet Take 15 mg daily cut the 30 mg in half  90 tablet  3  . levothyroxine (SYNTHROID, LEVOTHROID) 75 MCG tablet Take 75 mcg by mouth daily.       [name] lovastatin (MEVACOR) 40 MG tablet Take 1 tablet (40 mg total) by mouth at bedtime.  30 tablet  5  . Memantine HCl ER (NAMENDA XR) 28 MG CP24 Take 28 mg by mouth daily.      . Multiple Vitamin (MULTIVITAMIN WITH MINERALS) TABS tablet Take 1 tablet by mouth daily.      . nitroGLYCERIN (NITROSTAT) 0.4 MG SL tablet One under the tongue if needed for chest tightness. Repeat in 5 minutes if needed. Repeat again in 5 minutes if needed.  25 tablet  12  . Omega-3 Fatty Acids (FISH OIL) 1000 MG CAPS Take 1,000 mg by mouth at bedtime.       [name] omeprazole (PRILOSEC) 40 MG capsule Take 40 mg by mouth daily.      . tamsulosin (FLOMAX) 0.4 MG CAPS capsule Take 0.4 mg by mouth daily.      . traZODone (DESYREL) 50 MG tablet Take 50 mg by  mouth at bedtime.      [name] zinc gluconate 50 MG tablet Take 50 mg by mouth at bedtime.      [name] zolpidem (AMBIEN) 10 MG tablet Take 10 mg by mouth at bedtime.       No current facility-administered medications for this visit.    Allergies  Iodine and Iohexol  Electrocardiogram:  9/14  SR rate 45 old IMI   Assessment and Plan

## 2013-02-19 NOTE — Assessment & Plan Note (Addendum)
Home readings reviewed Increase cardizem to 240  Has been on hyzaar in past but didn't like it  ? fatigue

## 2013-02-19 NOTE — Assessment & Plan Note (Signed)
Cholesterol is at goal.  Continue current dose of statin and diet Rx.  No myalgias or side effects.  F/U  LFT's in 6 months. No results found for this basename: LDLCALC             

## 2013-02-19 NOTE — Assessment & Plan Note (Addendum)
Stable with no angina and good activity level.  Continue medical Rx CAth 01/28/13 with collateralized RCA chronic Continue nitrates

## 2013-03-03 ENCOUNTER — Encounter: Payer: Self-pay | Admitting: Internal Medicine

## 2013-03-03 ENCOUNTER — Ambulatory Visit (INDEPENDENT_AMBULATORY_CARE_PROVIDER_SITE_OTHER): Payer: Medicare Other | Admitting: Internal Medicine

## 2013-03-03 VITALS — BP 124/78 | HR 61 | Wt 175.0 lb

## 2013-03-03 DIAGNOSIS — R001 Bradycardia, unspecified: Secondary | ICD-10-CM

## 2013-03-03 DIAGNOSIS — Z23 Encounter for immunization: Secondary | ICD-10-CM

## 2013-03-03 DIAGNOSIS — I10 Essential (primary) hypertension: Secondary | ICD-10-CM

## 2013-03-03 DIAGNOSIS — I498 Other specified cardiac arrhythmias: Secondary | ICD-10-CM

## 2013-03-03 DIAGNOSIS — I2510 Atherosclerotic heart disease of native coronary artery without angina pectoris: Secondary | ICD-10-CM

## 2013-03-03 NOTE — Progress Notes (Signed)
Subjective:    Patient ID: Phillip Larson, male    DOB: [birth year], 78 y.o.   MRN: [id number]  Chief Complaint  Patient presents with  . Medical Managment of Chronic Issues    4 week follow-up     HPI Essential hypertension, benign: Saw Dr. Eden Emms. Cardizem was doubled to 240 mg qd. BP has improved.   Bradycardia: normal rate today  CAD: no angina. Playing tennis regularly.  Need for prophylactic vaccination and inoculation against influenza    Current Outpatient Prescriptions on File Prior to Visit  Medication Sig Dispense Refill  . ALPRAZolam (XANAX) 0.5 MG tablet TAKE 1/2 TO 1 TABLET BY MOUTH IF NEEDED FOR ANXIETY  30 tablet  1  . aspirin EC 81 MG tablet Take 81 mg by mouth at bedtime.      [name] b complex vitamins tablet Take 1 tablet by mouth daily.      . cholecalciferol (VITAMIN D) 1000 UNITS tablet Take 1,000 Units by mouth daily.      [name] diltiazem (CARDIZEM CD) 240 MG 24 hr capsule Take 1 capsule (240 mg total) by mouth daily.  90 capsule  3  . docusate sodium (COLACE) 100 MG capsule Take 100 mg by mouth daily.       . finasteride (PROSCAR) 5 MG tablet Take 1 tablet (5 mg total) by mouth daily.  30 tablet  5  . galantamine (RAZADYNE ER) 24 MG 24 hr capsule Take 24 mg by mouth daily with breakfast.      . isosorbide mononitrate (IMDUR) 30 MG 24 hr tablet Take 15 mg daily cut the 30 mg in half  90 tablet  3  . levothyroxine (SYNTHROID, LEVOTHROID) 75 MCG tablet Take 75 mcg by mouth daily.       [name] lovastatin (MEVACOR) 40 MG tablet Take 1 tablet (40 mg total) by mouth at bedtime.  30 tablet  5  . Memantine HCl ER (NAMENDA XR) 28 MG CP24 Take 28 mg by mouth daily.      . Multiple Vitamin (MULTIVITAMIN WITH MINERALS) TABS tablet Take 1 tablet by mouth daily.      . nitroGLYCERIN (NITROSTAT) 0.4 MG SL tablet One under the tongue if needed for chest tightness. Repeat in 5 minutes if needed. Repeat again in 5 minutes if needed.  25 tablet  12  . omeprazole (PRILOSEC) 40 MG capsule  Take 40 mg by mouth daily.      . tamsulosin (FLOMAX) 0.4 MG CAPS capsule Take 0.4 mg by mouth daily.      . traZODone (DESYREL) 50 MG tablet Take 50 mg by mouth at bedtime.      [name] zinc gluconate 50 MG tablet Take 50 mg by mouth at bedtime.      [name] zolpidem (AMBIEN) 10 MG tablet Take 10 mg by mouth at bedtime.       No current facility-administered medications on file prior to visit.    Review of Systems  Constitutional: Negative.   HENT: Negative.   Eyes: Negative.   Respiratory: Positive for shortness of breath. Negative for cough, choking, chest tightness and wheezing.   Cardiovascular: Negative for chest pain, palpitations and leg swelling.  Gastrointestinal: Negative.   Endocrine: Negative.   Genitourinary: Positive for frequency.       Incontinent of urine at night Sometimes loses control at night. Hx BPH.  Using tamsulosin and finasteride.  Musculoskeletal: Positive for arthralgias.  Neurological: Negative for tremors, syncope, weakness and headaches.  Memory loss.  Hematological: Negative.   Psychiatric/Behavioral: Positive for sleep disturbance.       Objective:BP 124/78  Pulse 61  Wt 175 lb (79.379 kg)  SpO2 98%    Physical Exam  Constitutional: He appears well-developed and well-nourished. No distress.  HENT:  Head: Normocephalic and atraumatic.  Right Ear: External ear normal.  Left Ear: External ear normal.  Nose: Nose normal.  Eyes: Conjunctivae and EOM are normal. Pupils are equal, round, and reactive to light.  Neck: Normal range of motion. Neck supple. No JVD present. No tracheal deviation present. No thyromegaly present.  Cardiovascular: Normal rate, regular rhythm, normal heart sounds and intact distal pulses.  Exam reveals no gallop and no friction rub.   No murmur heard. Pulmonary/Chest: Effort normal and breath sounds normal. No respiratory distress. He has no wheezes. He has no rales. He exhibits no tenderness.  Abdominal: Soft. Bowel sounds are  normal. He exhibits no distension and no mass. There is no tenderness.  Long-term right upper abdomen lump that seems most consistent with lipoma and that is unlikely to be a ventral hernia, but I cannot fully rule this out.  Musculoskeletal: Normal range of motion. He exhibits no edema and no tenderness.  Lymphadenopathy:    He has no cervical adenopathy.  Neurological: No cranial nerve deficit. Coordination normal.  Skin: Skin is warm and dry. No rash noted. No erythema. No pallor.  Area suspicious for lipoma under the ribs and the right upper abdomen.  Psychiatric: He has a normal mood and affect. His behavior is normal. Thought content normal.  Memory loss. Increased anxiety. Not depressed.     Office Visit on 01/22/2013  Component Date Value Range Status  . WBC 01/22/2013 8.2  4.5 - 10.5 K/uL Final  . RBC 01/22/2013 4.87  4.22 - 5.81 Mil/uL Final  . Hemoglobin 01/22/2013 15.4  13.0 - 17.0 g/dL Final  . HCT 16/01/9603 45.1  39.0 - 52.0 % Final  . MCV 01/22/2013 92.5  78.0 - 100.0 fl Final  . MCHC 01/22/2013 34.2  30.0 - 36.0 g/dL Final  . RDW 54/12/8117 13.1  11.5 - 14.6 % Final  . Platelets 01/22/2013 225.0  150.0 - 400.0 K/uL Final  . Neutrophils Relative % 01/22/2013 70.3  43.0 - 77.0 % Final  . Lymphocytes Relative 01/22/2013 17.6  12.0 - 46.0 % Final  . Monocytes Relative 01/22/2013 7.4  3.0 - 12.0 % Final  . Eosinophils Relative 01/22/2013 4.4  0.0 - 5.0 % Final  . Basophils Relative 01/22/2013 0.3  0.0 - 3.0 % Final  . Neutro Abs 01/22/2013 5.7  1.4 - 7.7 K/uL Final  . Lymphs Abs 01/22/2013 1.4  0.7 - 4.0 K/uL Final  . Monocytes Absolute 01/22/2013 0.6  0.1 - 1.0 K/uL Final  . Eosinophils Absolute 01/22/2013 0.4  0.0 - 0.7 K/uL Final  . Basophils Absolute 01/22/2013 0.0  0.0 - 0.1 K/uL Final  . Sodium 01/22/2013 141  135 - 145 mEq/L Final  . Potassium 01/22/2013 4.6  3.5 - 5.1 mEq/L Final  . Chloride 01/22/2013 102  96 - 112 mEq/L Final  . CO2 01/22/2013 30  19 - 32  mEq/L Final  . Glucose, Bld 01/22/2013 107* 70 - 99 mg/dL Final  . BUN 14/78/2956 17  6 - 23 mg/dL Final  . Creatinine, Ser 01/22/2013 1.5  0.4 - 1.5 mg/dL Final  . Calcium 21/30/8657 10.7* 8.4 - 10.5 mg/dL Final  . GFR 84/69/6295 50.25* >60.00 mL/min Final  .  Prothrombin Time 01/22/2013 10.5  10.2 - 12.4 sec Final  . INR 01/22/2013 1.0  0.8 - 1.0 ratio Final  Admission on 01/16/2013, Discharged on 01/17/2013  Component Date Value Range Status  . Pro B Natriuretic peptide (BNP) 01/16/2013 326.5  0 - 450 pg/mL Final  . WBC 01/16/2013 6.9  4.0 - 10.5 K/uL Final  . RBC 01/16/2013 4.39  4.22 - 5.81 MIL/uL Final  . Hemoglobin 01/16/2013 14.1  13.0 - 17.0 g/dL Final  . HCT 16/01/9603 39.3  39.0 - 52.0 % Final  . MCV 01/16/2013 89.5  78.0 - 100.0 fL Final  . MCH 01/16/2013 32.1  26.0 - 34.0 pg Final  . MCHC 01/16/2013 35.9  30.0 - 36.0 g/dL Final  . RDW 54/12/8117 12.4  11.5 - 15.5 % Final  . Platelets 01/16/2013 200  150 - 400 K/uL Final  . Sodium 01/16/2013 137  135 - 145 mEq/L Final  . Potassium 01/16/2013 4.0  3.5 - 5.1 mEq/L Final  . Chloride 01/16/2013 99  96 - 112 mEq/L Final  . CO2 01/16/2013 26  19 - 32 mEq/L Final  . Glucose, Bld 01/16/2013 107* 70 - 99 mg/dL Final  . BUN 14/78/2956 13  6 - 23 mg/dL Final  . Creatinine, Ser 01/16/2013 1.25  0.50 - 1.35 mg/dL Final  . Calcium 21/30/8657 9.8  8.4 - 10.5 mg/dL Final  . GFR calc non Af Amer 01/16/2013 54* >90 mL/min Final  . GFR calc Af Amer 01/16/2013 63* >90 mL/min Final   Comment: (NOTE)                          The eGFR has been calculated using the CKD EPI equation.                          This calculation has not been validated in all clinical situations.                          eGFR's persistently <90 mL/min signify possible Chronic Kidney                          Disease.  [name] Specific Gravity, Urine 01/16/2013 1.007  1.005 - 1.030 Final  . pH 01/16/2013 6.0  5.0 - 8.0 Final  . Glucose, UA 01/16/2013 NEGATIVE  NEGATIVE  mg/dL Final  . Hgb urine dipstick 01/16/2013 NEGATIVE  NEGATIVE Final  . Bilirubin Urine 01/16/2013 NEGATIVE  NEGATIVE Final  . Ketones, ur 01/16/2013 15* NEGATIVE mg/dL Final  . Protein, ur 84/69/6295 NEGATIVE  NEGATIVE mg/dL Final  . Urobilinogen, UA 01/16/2013 0.2  0.0 - 1.0 mg/dL Final  . Nitrite 28/41/3244 NEGATIVE  NEGATIVE Final  . Leukocytes, UA 01/16/2013 SMALL* NEGATIVE Final  . Calcium, Ion 01/16/2013 1.37* 1.13 - 1.30 mmol/L Final   Performed at [facility]  . PTH 01/16/2013 21.0  14.0 - 72.0 pg/mL Final  . Calcium, Total (PTH) 01/16/2013 10.2  8.4 - 10.5 mg/dL Final   Performed at [facility]  . Troponin i, poc 01/16/2013 0.00  0.00 - 0.08 ng/mL Final  . Comment 3 01/16/2013          Final   Comment: Due to the release kinetics of cTnI,  a negative result within the first hours                          of the onset of symptoms does not rule out                          myocardial infarction with certainty.                          If myocardial infarction is still suspected,                          repeat the test at appropriate intervals.  . Troponin i, poc 01/17/2013 0.01  0.00 - 0.08 ng/mL Final  . Comment 3 01/17/2013          Final   Comment: Due to the release kinetics of cTnI,                          a negative result within the first hours                          of the onset of symptoms does not rule out                          myocardial infarction with certainty.                          If myocardial infarction is still suspected,                          repeat the test at appropriate intervals.        Assessment & Plan:  Essential hypertension, benign: continue current medications  Bradycardia: rate 61 today  CAD: asymptomatic  Need for prophylactic vaccination and inoculation against influenza: administered

## 2013-03-03 NOTE — Patient Instructions (Signed)
Continue current medication.

## 2013-03-04 ENCOUNTER — Ambulatory Visit: Payer: Self-pay | Admitting: Internal Medicine

## 2013-03-19 ENCOUNTER — Telehealth: Payer: Self-pay | Admitting: *Deleted

## 2013-03-19 ENCOUNTER — Encounter: Payer: Self-pay | Admitting: *Deleted

## 2013-03-19 DIAGNOSIS — E785 Hyperlipidemia, unspecified: Secondary | ICD-10-CM

## 2013-03-19 DIAGNOSIS — Z79899 Other long term (current) drug therapy: Secondary | ICD-10-CM

## 2013-03-19 NOTE — Telephone Encounter (Signed)
MR  Poster YOU WILL DUE  FOR  FASTING LABS  IN FEB SINCE CHANGING  CHOLESTEROL MED ./

## 2013-03-19 NOTE — Telephone Encounter (Signed)
PT  AWARE THE  NEED  TO STOP  LOVASTATIN  DUE   CONTRAINDICATION WITH  DILTIAZEM AND  START  ATORVASTATIN 20 MG  WILL RE CHECK   LIPID LIVER IN  3 MONTHS  DUE  FEB./CY

## 2013-03-21 ENCOUNTER — Other Ambulatory Visit: Payer: Self-pay | Admitting: Internal Medicine

## 2013-03-22 NOTE — Telephone Encounter (Signed)
TSH 244.9 due

## 2013-04-05 ENCOUNTER — Other Ambulatory Visit: Payer: Self-pay | Admitting: Nurse Practitioner

## 2013-04-06 ENCOUNTER — Other Ambulatory Visit: Payer: Self-pay | Admitting: Nurse Practitioner

## 2013-04-06 NOTE — Telephone Encounter (Signed)
I called pharmacy, RX was not received yesterday. I gave the verbal for rx to be approved

## 2013-04-12 ENCOUNTER — Other Ambulatory Visit: Payer: Self-pay | Admitting: Internal Medicine

## 2013-04-27 ENCOUNTER — Other Ambulatory Visit: Payer: Self-pay | Admitting: *Deleted

## 2013-04-27 MED ORDER — LEVOTHYROXINE SODIUM 75 MCG PO TABS: ORAL_TABLET | ORAL | Status: DC

## 2013-04-29 ENCOUNTER — Other Ambulatory Visit: Payer: Self-pay | Admitting: Internal Medicine

## 2013-04-29 DIAGNOSIS — Z87898 Personal history of other specified conditions: Secondary | ICD-10-CM

## 2013-04-29 DIAGNOSIS — E782 Mixed hyperlipidemia: Secondary | ICD-10-CM

## 2013-04-29 MED ORDER — LOVASTATIN 40 MG PO TABS: 40.0000 mg | ORAL_TABLET | Freq: Every day | ORAL | Status: DC

## 2013-04-29 MED ORDER — LEVOTHYROXINE SODIUM 75 MCG PO TABS: ORAL_TABLET | ORAL | Status: DC

## 2013-04-29 MED ORDER — FINASTERIDE 5 MG PO TABS: 5.0000 mg | ORAL_TABLET | Freq: Every day | ORAL | Status: DC

## 2013-04-29 MED ORDER — TRAZODONE HCL 50 MG PO TABS: ORAL_TABLET | ORAL | Status: DC

## 2013-05-26 ENCOUNTER — Ambulatory Visit
Admission: RE | Admit: 2013-05-26 | Discharge: 2013-05-26 | Disposition: A | Discharge status: Home / Self Care | Payer: Medicare Other | Source: Ambulatory Visit | Attending: Nurse Practitioner | Admitting: Nurse Practitioner

## 2013-05-26 ENCOUNTER — Encounter: Payer: Self-pay | Admitting: Nurse Practitioner

## 2013-05-26 ENCOUNTER — Ambulatory Visit (INDEPENDENT_AMBULATORY_CARE_PROVIDER_SITE_OTHER): Payer: Medicare Other | Admitting: Nurse Practitioner

## 2013-05-26 ENCOUNTER — Telehealth: Payer: Self-pay | Admitting: Emergency Medicine

## 2013-05-26 VITALS — BP 122/76 | HR 69 | Temp 97.8°F | Resp 16 | Wt 177.0 lb

## 2013-05-26 DIAGNOSIS — R0789 Other chest pain: Secondary | ICD-10-CM

## 2013-05-26 DIAGNOSIS — G309 Alzheimer's disease, unspecified: Principal | ICD-10-CM

## 2013-05-26 DIAGNOSIS — F411 Generalized anxiety disorder: Secondary | ICD-10-CM

## 2013-05-26 DIAGNOSIS — F0280 Dementia in other diseases classified elsewhere without behavioral disturbance: Secondary | ICD-10-CM

## 2013-05-26 MED ORDER — MEMANTINE HCL ER 28 MG PO CP24: 28.0000 mg | ORAL_CAPSULE | Freq: Every day | ORAL | Status: DC

## 2013-05-26 MED ORDER — ALPRAZOLAM 0.5 MG PO TABS: ORAL_TABLET | ORAL | Status: DC

## 2013-05-26 MED ORDER — GALANTAMINE HYDROBROMIDE ER 24 MG PO CP24: 24.0000 mg | ORAL_CAPSULE | Freq: Every day | ORAL | Status: DC

## 2013-05-26 NOTE — Progress Notes (Signed)
Patient ID: Phillip Larson, male   DOB: 08/15/1934, 79 y.o.   MRN: [id number]    Allergies  Allergen Reactions  . Iodine Swelling    Internal iodine  . Iohexol Hives         Chief Complaint  Patient presents with  . Acute Visit    RT lung problems, SOB when having pressure on that side with seat belr on and trouble sleeping on anything but his side due to discomfort    HPI: Patient is a 79 y.o. male seen in the office today for uncomfortable feeling in chest In 2008 pt has surgery on knee; reports during the surgery right lung collapse  6 months ago; pt had shortness of breath, had another cardiac cath which was "good" per pt however blood pressure was not well controlled, changed medication and now this is better Pt conts to play tennis 3 times a week; doubles with ppl of his age, 1 hr and 30 mins without problems However when he drives the seatbelt over his right chest is uncomfortable, and has to sleep on left side due to pain; had an episode when he had on multiple layers of clothes on and this caused shortness of breath Wife reports question anxiety attack-- although she is wondering if there is not something going on that makes him not be able to breath and then he has the attack He can not get a deep breath, but no pain.  Went to pulmonary doctor a few years back -- same symptoms did some test that did not show anything  Review of Systems:  Review of Systems  Constitutional: Negative for fever, chills and malaise/fatigue.  Respiratory: Positive for shortness of breath (when he is unable to get a deep breath; exercises without problems). Negative for cough.   Cardiovascular: Negative for chest pain, palpitations and leg swelling.  Gastrointestinal: Negative for heartburn, abdominal pain, diarrhea and constipation.  Genitourinary: Negative for dysuria, urgency and frequency.  Musculoskeletal: Negative for back pain, myalgias and neck pain.  Skin: Negative.   Neurological:  Negative for weakness.     Past Medical History  Diagnosis Date  . Mixed hyperlipidemia   . ALZHEIMERS DISEASE   . Essential hypertension, benign   . CAD   . BENIGN PROSTATIC HYPERTROPHY, HX OF   . Impacted cerumen   . Anxiety state, unspecified   . Pressure ulcer   . Actinic keratosis   . Enthesopathy of hip region   . Unspecified hereditary and idiopathic peripheral neuropathy   . Benign neoplasm of colon   . Unspecified vitamin D deficiency   . Other premature beats   . Diverticulosis of colon (without mention of hemorrhage)   . Unspecified hypothyroidism   . Coronary atherosclerosis of native coronary artery   . Urinary obstruction, unspecified   . Actinic keratosis   . Pain in joint, site unspecified   . Osteoarthrosis, unspecified whether generalized or localized, unspecified site   . Peptic ulcer, unspecified site, unspecified as acute or chronic, without mention of hemorrhage, perforation, or obstruction   . Other and unspecified hyperlipidemia   . Alzheimer's disease   . Myalgia and myositis, unspecified   . Insomnia, unspecified   . Other malaise and fatigue   . External hemorrhoids without mention of complication   . Anal fissure   . Other psoriasis   . Unspecified essential hypertension   . Depressive disorder, not elsewhere classified   . Cerebrovascular disease, unspecified   . Acute, but ill-defined,  cerebrovascular disease   . Reflux esophagitis   . Pain in joint, lower leg   . Palpitations   . Shortness of breath   . Coronary atherosclerosis of native coronary artery   . Impacted cerumen [id number]  . Pressure ulcer of buttock, unstageable   . Unspecified hereditary and idiopathic peripheral neuropathy   . Urinary obstruction, unspecified   . Actinic keratosis   . Osteoarthrosis, unspecified whether generalized or localized, unspecified site   . Peptic ulcer, unspecified site, unspecified as acute or chronic, without mention of hemorrhage, perforation,  or obstruction   . Myalgia and myositis, unspecified   . Insomnia, unspecified   . Other malaise and fatigue   . External hemorrhoids without mention of complication   . Anal fissure   . Depressive disorder, not elsewhere classified   . Cerebrovascular disease, unspecified   . Acute, but ill-defined, cerebrovascular disease   . Palpitations    Past Surgical History  Procedure Laterality Date  . Knee surgery  07/18/2006    Dr French Ana   . Cervical disc surgery    . Laminectomy c3 disc  1972  . Retinal tear repair cryotherapy      Dr Wayne Sever   . Multiple tooth extractions  2014   Social History:   reports that he quit smoking about 21 years ago. His smoking use included Cigarettes. He has a 50 pack-year smoking history. He has never used smokeless tobacco. He reports that he drinks alcohol. He reports that he does not use illicit drugs.  Family History  Problem Relation Age of Onset  . Cancer Father     pancreatic  . Cancer Mother     pancreatic    Medications: Patient's Medications  New Prescriptions   No medications on file  Previous Medications   ALPRAZOLAM (XANAX) 0.5 MG TABLET    TAKE 1/2 TO 1 TABLET AS NEEDED FOR ANXIETY   ASPIRIN EC 81 MG TABLET    Take 81 mg by mouth at bedtime.   B COMPLEX VITAMINS TABLET    Take 1 tablet by mouth daily.   CHOLECALCIFEROL (VITAMIN D) 1000 UNITS TABLET    Take 1,000 Units by mouth daily.   DILTIAZEM (CARDIZEM CD) 240 MG 24 HR CAPSULE    Take 1 capsule (240 mg total) by mouth daily.   DOCUSATE SODIUM (COLACE) 100 MG CAPSULE    Take 100 mg by mouth daily.    FINASTERIDE (PROSCAR) 5 MG TABLET    Take 1 tablet (5 mg total) by mouth daily.   GALANTAMINE (RAZADYNE ER) 24 MG 24 HR CAPSULE    Take 24 mg by mouth daily with breakfast.   ISOSORBIDE MONONITRATE (IMDUR) 30 MG 24 HR TABLET    Take 15 mg daily cut the 30 mg in half   LEVOTHYROXINE (SYNTHROID, LEVOTHROID) 75 MCG TABLET    Take one tablet by mouth once daily for thyroid   LORATADINE  (CLARITIN) 10 MG TABLET    Take 10 mg by mouth daily.   LOVASTATIN (MEVACOR) 40 MG TABLET    Take 1 tablet (40 mg total) by mouth at bedtime.   MEMANTINE HCL ER (NAMENDA XR) 28 MG CP24    Take 28 mg by mouth daily.   MULTIPLE VITAMIN (MULTIVITAMIN WITH MINERALS) TABS TABLET    Take 1 tablet by mouth daily.   NITROGLYCERIN (NITROSTAT) 0.4 MG SL TABLET    One under the tongue if needed for chest tightness. Repeat in 5 minutes if needed. Repeat again  in 5 minutes if needed.   OMEGA-3 FATTY ACIDS (FISH OIL) 1200 MG CAPS    Take by mouth.   OMEPRAZOLE (PRILOSEC) 40 MG CAPSULE    Take 40 mg by mouth daily.   SPECIALTY VITAMINS PRODUCTS (MAGNESIUM, AMINO ACID CHELATE,) 133 MG TABLET    Take 1 tablet by mouth 1 day or 1 dose.   TAMSULOSIN (FLOMAX) 0.4 MG CAPS CAPSULE    Take 0.4 mg by mouth daily.   TRAZODONE (DESYREL) 50 MG TABLET    Take one tablet at bedtime to help with sleep   ZINC GLUCONATE 50 MG TABLET    Take 50 mg by mouth at bedtime.   ZOLPIDEM (AMBIEN) 10 MG TABLET    TAKE 1 TABLET BY MOUTH AT BEDTIME AS NEEDED FOR SLEEP  Modified Medications   No medications on file  Discontinued Medications   No medications on file     Physical Exam:  Filed Vitals:   05/26/13 0828  BP: 122/76  Pulse: 69  Temp: 97.8 F (36.6 C)  TempSrc: Oral  Resp: 16  Weight: 177 lb (80.287 kg)  SpO2: 98%    Physical Exam  Vitals reviewed. Constitutional: He is oriented to person, place, and time and well-developed, well-nourished, and in no distress. No distress.  HENT:  Head: Normocephalic and atraumatic.  Mouth/Throat: Oropharynx is clear and moist. No oropharyngeal exudate.  Eyes: Conjunctivae and EOM are normal. Pupils are equal, round, and reactive to light.  Cardiovascular: Normal rate, regular rhythm and normal heart sounds.   Pulmonary/Chest: Effort normal and breath sounds normal. No respiratory distress. He has no wheezes. He has no rales. He exhibits no tenderness.  Abdominal: Soft. Bowel  sounds are normal. He exhibits no distension. There is no tenderness.  Neurological: He is alert and oriented to person, place, and time.  Skin: Skin is warm and dry. He is not diaphoretic.     Labs reviewed: Basic Metabolic Panel:  Recent Labs  09/01/12 1457 01/16/13 2124 01/16/13 2205 01/22/13 1218  NA 140 137  --  141  K 4.4 4.0  --  4.6  CL 102 99  --  102  CO2 28 26  --  30  GLUCOSE 89 107*  --  107*  BUN 8 13  --  17  CREATININE 1.12 1.25  --  1.5  CALCIUM 10.0 9.8 10.2 10.7*   Liver Function Tests:  Recent Labs  09/01/12 1457  AST 19  ALT 18  ALKPHOS 86  BILITOT 0.7  PROT 6.5   No results found for this basename: LIPASE, AMYLASE,  in the last 8760 hours No results found for this basename: AMMONIA,  in the last 8760 hours CBC:  Recent Labs  01/16/13 2124 01/22/13 1218  WBC 6.9 8.2  NEUTROABS  --  5.7  HGB 14.1 15.4  HCT 39.3 45.1  MCV 89.5 92.5  PLT 200 225.0     Assessment/Plan 1. ALZHEIMERS DISEASE - galantamine (RAZADYNE ER) 24 MG 24 hr capsule; Take 1 capsule (24 mg total) by mouth daily with breakfast.  Dispense: 30 capsule; Refill: 2 - Memantine HCl ER (NAMENDA XR) 28 MG CP24; Take 28 mg by mouth daily.  Dispense: 30 capsule; Refill: 3  2. Anxiety state, unspecified -question if these episodes are not related to anxiety may need additional medication for anxiety but pt does not wish to start at this time - ALPRAZolam (XANAX) 0.5 MG tablet; Take 1/2 to 1 tablet as needed for anxiety  Dispense:  30 tablet; Refill: 0  3. Chest discomfort -looking back workup from pulmonary was negative back in 2013; symptoms are unchanged, cardiac workup negative -wife and pt are wanting to be re-evaluated by pulmonary to see if any other test are warranted -went over PFT from 2013 -question anxiety as the source but pt wants further workup to rule out other diagnoses  - will get DG Chest 2 View to compare and  Ambulatory referral to Pulmonology for further  evaluation

## 2013-05-26 NOTE — Telephone Encounter (Signed)
Dr Elsworth Soho, are you okay with seeing the pt?  Please advise thanks!

## 2013-05-26 NOTE — Telephone Encounter (Signed)
I don't have a preference, but its up to the new MD to decide if they want to see him or not.

## 2013-05-26 NOTE — Telephone Encounter (Signed)
Spoke with Earlie Server  She states pt wants to switch to a different provider here for ongoing dyspnea He did not state why, and does not want to see MW  Please advise if this is okay and if there is a specific doc you want him to see, thanks

## 2013-05-26 NOTE — Patient Instructions (Signed)
Will get chest xray and send you back to pulmonary for further evaluation

## 2013-05-27 NOTE — Telephone Encounter (Signed)
Unable  -pl ask other providers

## 2013-05-27 NOTE — Telephone Encounter (Signed)
Dr Sood 

## 2013-05-27 NOTE — Telephone Encounter (Signed)
That is fine with me.  He will need 30 minutes spot.

## 2013-05-27 NOTE — Telephone Encounter (Signed)
Spoke with the pt's spouse and scheduled appt with VS for 06/14/12  Nothing further needed

## 2013-06-16 ENCOUNTER — Encounter: Payer: Self-pay | Admitting: Pulmonary Disease

## 2013-06-16 ENCOUNTER — Ambulatory Visit (INDEPENDENT_AMBULATORY_CARE_PROVIDER_SITE_OTHER): Payer: Medicare Other | Admitting: Pulmonary Disease

## 2013-06-16 ENCOUNTER — Other Ambulatory Visit (INDEPENDENT_AMBULATORY_CARE_PROVIDER_SITE_OTHER): Payer: Medicare Other

## 2013-06-16 VITALS — BP 124/76 | HR 64 | Ht 71.0 in | Wt 178.0 lb

## 2013-06-16 DIAGNOSIS — R0609 Other forms of dyspnea: Secondary | ICD-10-CM

## 2013-06-16 DIAGNOSIS — R0600 Dyspnea, unspecified: Secondary | ICD-10-CM

## 2013-06-16 DIAGNOSIS — R0989 Other specified symptoms and signs involving the circulatory and respiratory systems: Secondary | ICD-10-CM

## 2013-06-16 LAB — CBC WITH DIFFERENTIAL/PLATELET
BASOS PCT: 0.2 % (ref 0.0–3.0)
Basophils Absolute: 0 10*3/uL (ref 0.0–0.1)
EOS PCT: 3 % (ref 0.0–5.0)
Eosinophils Absolute: 0.2 10*3/uL (ref 0.0–0.7)
HEMATOCRIT: 45.6 % (ref 39.0–52.0)
Hemoglobin: 15.2 g/dL (ref 13.0–17.0)
LYMPHS ABS: 1 10*3/uL (ref 0.7–4.0)
Lymphocytes Relative: 16.8 % (ref 12.0–46.0)
MCHC: 33.4 g/dL (ref 30.0–36.0)
MCV: 93.1 fl (ref 78.0–100.0)
Monocytes Absolute: 0.3 10*3/uL (ref 0.1–1.0)
Monocytes Relative: 5.1 % (ref 3.0–12.0)
NEUTROS PCT: 74.9 % (ref 43.0–77.0)
Neutro Abs: 4.7 10*3/uL (ref 1.4–7.7)
Platelets: 229 10*3/uL (ref 150.0–400.0)
RBC: 4.9 Mil/uL (ref 4.22–5.81)
RDW: 13.8 % (ref 11.5–14.6)
WBC: 6.2 10*3/uL (ref 4.5–10.5)

## 2013-06-16 LAB — COMPREHENSIVE METABOLIC PANEL
ALK PHOS: 111 U/L (ref 39–117)
ALT: 21 U/L (ref 0–53)
AST: 22 U/L (ref 0–37)
Albumin: 3.9 g/dL (ref 3.5–5.2)
BUN: 14 mg/dL (ref 6–23)
CO2: 29 mEq/L (ref 19–32)
CREATININE: 1.2 mg/dL (ref 0.4–1.5)
Calcium: 9.4 mg/dL (ref 8.4–10.5)
Chloride: 101 mEq/L (ref 96–112)
GFR: 63.06 mL/min (ref >60.00)
Glucose, Bld: 106 mg/dL — ABNORMAL HIGH (ref 70–99)
Potassium: 4.7 mEq/L (ref 3.5–5.1)
Sodium: 136 mEq/L (ref 135–145)
Total Bilirubin: 0.9 mg/dL (ref 0.3–1.2)
Total Protein: 6.9 g/dL (ref 6.0–8.3)

## 2013-06-16 NOTE — Progress Notes (Deleted)
   Subjective:    Patient ID: Phillip Larson, male    DOB: 31-Aug-1934, 79 y.o.   MRN: [id number]  HPI    Review of Systems  Constitutional: Negative for fever, chills, diaphoresis, activity change, appetite change, fatigue and unexpected weight change.  HENT: Negative for congestion, dental problem, ear discharge, ear pain, facial swelling, hearing loss, mouth sores, nosebleeds, postnasal drip, rhinorrhea, sinus pressure, sneezing, sore throat, tinnitus, trouble swallowing and voice change.   Eyes: Negative for photophobia, discharge, itching and visual disturbance.  Respiratory: Positive for shortness of breath. Negative for apnea, cough, choking, chest tightness, wheezing and stridor.   Cardiovascular: Negative for chest pain, palpitations and leg swelling.  Gastrointestinal: Negative for nausea, vomiting, abdominal pain, constipation, blood in stool and abdominal distention.  Genitourinary: Negative for dysuria, urgency, frequency, hematuria, flank pain, decreased urine volume and difficulty urinating.  Musculoskeletal: Negative for arthralgias, back pain, gait problem, joint swelling, myalgias, neck pain and neck stiffness.       Itching  Skin: Negative for color change, pallor and rash.  Neurological: Negative for dizziness, tremors, seizures, syncope, speech difficulty, weakness, light-headedness, numbness and headaches.  Hematological: Negative for adenopathy. Does not bruise/bleed easily.  Psychiatric/Behavioral: Negative for confusion, sleep disturbance and agitation. The patient is not nervous/anxious.        Objective:   Physical Exam        Assessment & Plan:

## 2013-06-16 NOTE — Patient Instructions (Signed)
Lab test today Will schedule breathing test (PFT) and CT chest Follow up in 4 weeks

## 2013-06-16 NOTE — Progress Notes (Signed)
Chief Complaint  Patient presents with  . Pulmonary Consult    Former pt of [facility]    History of Present Illness: Phillip Larson is a 79 y.o. male former smoker evaluation of dyspnea.  He was seen in 2008 by Dr. Danton Sewer for hypoxia after knee surgery.  Cause for this was likely related to atelectasis.  He was seen by Dr. Lamonte Sakai in 2013 for evaluation of dyspnea.  Again, no specific diagnosis was established for cause of his symptoms.  He is very active, and plays tennis 3 times per week.  He has not trouble with his breathing when playing tennis.  He only notices trouble when he crosses his arms or bends over.  He also can't sleep on his right side.  He feels more short of breath when he has a seatbelt across his abdomen also.  He gets discomfort in his right chest.    He developed elevated blood pressure last Summer when walking steps, and had to get his medications changed.  He is followed by cariology, and last seen in October 2014.  He has lost 15 lbs recently after he needed multiple dental extractions, and this has helped his breathing.  He had a sleep study about 25 years ago to evaluate snoring.  He continues to snore, and has trouble falling asleep.  Once he is asleep, then he can sleep okay.  Tests: CT chest3/31/08 >> no PE, coronary calcifications, subsegmental atelectasis Lt and Rt heart cath 08/07/06 >> stable CAD, RA 4, RV 35/4, PA 31/10, PCWP 5, LVEDP 135/11, CI 3.3 Echo 12/09/11 >> mild LVH, EF 55 to 33%, grade 2 diastolic dysfunction, mild LA/RA dilation, PAS 32 mmHg Spirometry 03/13/12 >> FEV1 2.97 (86%), FEV1% 78 Echo 12/03/12 >> mild LVH, EF 60 to 82%, grade 1 diastolic dysfx, mild AS, mild LA dilation  Phillip Larson  has a past medical history of Mixed hyperlipidemia; ALZHEIMERS DISEASE; Essential hypertension, benign; CAD; BENIGN PROSTATIC HYPERTROPHY, HX OF; Impacted cerumen; Anxiety state, unspecified; Pressure ulcer; Actinic keratosis; Enthesopathy of hip  region; Unspecified hereditary and idiopathic peripheral neuropathy; Benign neoplasm of colon; Unspecified vitamin D deficiency; Other premature beats; Diverticulosis of colon (without mention of hemorrhage); Unspecified hypothyroidism; Coronary atherosclerosis of native coronary artery; Urinary obstruction, unspecified; Actinic keratosis; Pain in joint, site unspecified; Osteoarthrosis, unspecified whether generalized or localized, unspecified site; Peptic ulcer, unspecified site, unspecified as acute or chronic, without mention of hemorrhage, perforation, or obstruction; Other and unspecified hyperlipidemia; Alzheimer's disease; Myalgia and myositis, unspecified; Insomnia, unspecified; Other malaise and fatigue; External hemorrhoids without mention of complication; Anal fissure; Other psoriasis; Unspecified essential hypertension; Depressive disorder, not elsewhere classified; Cerebrovascular disease, unspecified; Acute, but ill-defined, cerebrovascular disease; Reflux esophagitis; Pain in joint, lower leg; Palpitations; Shortness of breath; Coronary atherosclerosis of native coronary artery; Impacted cerumen ([id number]); Pressure ulcer of buttock, unstageable; Unspecified hereditary and idiopathic peripheral neuropathy; Urinary obstruction, unspecified; Actinic keratosis; Osteoarthrosis, unspecified whether generalized or localized, unspecified site; Peptic ulcer, unspecified site, unspecified as acute or chronic, without mention of hemorrhage, perforation, or obstruction; Myalgia and myositis, unspecified; Insomnia, unspecified; Other malaise and fatigue; External hemorrhoids without mention of complication; Anal fissure; Depressive disorder, not elsewhere classified; Cerebrovascular disease, unspecified; Acute, but ill-defined, cerebrovascular disease; and Palpitations.  Phillip Larson  has past surgical history that includes Knee surgery (07/18/2006); Cervical disc surgery; LAMINECTOMY C3 disc (1972); Retinal  tear repair cryotherapy; and Multiple tooth extractions (2014).  Prior to Admission medications   Medication Sig Start Date End Date Taking?  Authorizing Provider  ALPRAZolam Duanne Moron) 0.5 MG tablet Take 1/2 to 1 tablet as needed for anxiety 05/26/13  Yes Pricilla Larsson, NP  aspirin EC 81 MG tablet Take 81 mg by mouth at bedtime.   Yes Historical Provider, MD  b complex vitamins tablet Take 1 tablet by mouth daily.   Yes Historical Provider, MD  cholecalciferol (VITAMIN D) 1000 UNITS tablet Take 1,000 Units by mouth daily.   Yes Historical Provider, MD  diltiazem (CARDIZEM CD) 240 MG 24 hr capsule Take 1 capsule (240 mg total) by mouth daily. 02/19/13  Yes Josue Hector, MD  docusate sodium (COLACE) 100 MG capsule Take 100 mg by mouth daily.    Yes Historical Provider, MD  finasteride (PROSCAR) 5 MG tablet Take 1 tablet (5 mg total) by mouth daily. 04/29/13  Yes Pricilla Larsson, NP  galantamine (RAZADYNE ER) 24 MG 24 hr capsule Take 1 capsule (24 mg total) by mouth daily with breakfast. 05/26/13  Yes Pricilla Larsson, NP  isosorbide mononitrate (IMDUR) 30 MG 24 hr tablet Take 15 mg daily cut the 30 mg in half 01/29/13  Yes Josue Hector, MD  levothyroxine (SYNTHROID, LEVOTHROID) 75 MCG tablet Take one tablet by mouth once daily for thyroid 04/29/13  Yes Pricilla Larsson, NP  loratadine (CLARITIN) 10 MG tablet Take 10 mg by mouth daily.   Yes Historical Provider, MD  lovastatin (MEVACOR) 40 MG tablet Take 1 tablet (40 mg total) by mouth at bedtime. 04/29/13  Yes Pricilla Larsson, NP  Memantine HCl ER (NAMENDA XR) 28 MG CP24 Take 28 mg by mouth daily. 05/26/13  Yes Pricilla Larsson, NP  Multiple Vitamin (MULTIVITAMIN WITH MINERALS) TABS tablet Take 1 tablet by mouth daily.   Yes Historical Provider, MD  nitroGLYCERIN (NITROSTAT) 0.4 MG SL tablet One under the tongue if needed for chest tightness. Repeat in 5 minutes if needed. Repeat again in 5 minutes if needed. 02/03/13  Yes Estill Dooms, MD  Omega-3 Fatty  Acids (FISH OIL) 1200 MG CAPS Take by mouth.   Yes Historical Provider, MD  omeprazole (PRILOSEC) 40 MG capsule Take 40 mg by mouth daily.   Yes Historical Provider, MD  Specialty Vitamins Products (MAGNESIUM, AMINO ACID CHELATE,) 133 MG tablet Take 1 tablet by mouth 1 day or 1 dose.   Yes Historical Provider, MD  tamsulosin (FLOMAX) 0.4 MG CAPS capsule Take 0.4 mg by mouth daily.   Yes Historical Provider, MD  traZODone (DESYREL) 50 MG tablet Take one tablet at bedtime to help with sleep 04/29/13  Yes Pricilla Larsson, NP  zinc gluconate 50 MG tablet Take 50 mg by mouth at bedtime.   Yes Historical Provider, MD  zolpidem (AMBIEN) 10 MG tablet TAKE 1 TABLET BY MOUTH AT BEDTIME AS NEEDED FOR SLEEP 04/29/13  Yes Pricilla Larsson, NP    Allergies  Allergen Reactions  . Iodine Swelling    Internal iodine  . Iohexol Hives         His family history includes Cancer in his father and mother.  He  reports that he quit smoking about 21 years ago. His smoking use included Cigarettes. He has a 50 pack-year smoking history. He has never used smokeless tobacco. He reports that he drinks alcohol. He reports that he does not use illicit drugs.  Review of Systems  Constitutional: Negative for fever, chills, diaphoresis, activity change, appetite change, fatigue and unexpected weight change.  HENT: Negative for congestion, dental problem, ear  discharge, ear pain, facial swelling, hearing loss, mouth sores, nosebleeds, postnasal drip, rhinorrhea, sinus pressure, sneezing, sore throat, tinnitus, trouble swallowing and voice change.   Eyes: Negative for photophobia, discharge, itching and visual disturbance.  Respiratory: Positive for shortness of breath. Negative for apnea, cough, choking, chest tightness, wheezing and stridor.   Cardiovascular: Negative for chest pain, palpitations and leg swelling.  Gastrointestinal: Negative for nausea, vomiting, abdominal pain, constipation, blood in stool and abdominal  distention.  Genitourinary: Negative for dysuria, urgency, frequency, hematuria, flank pain, decreased urine volume and difficulty urinating.  Musculoskeletal: Negative for arthralgias, back pain, gait problem, joint swelling, myalgias, neck pain and neck stiffness.       Itching  Skin: Negative for color change, pallor and rash.  Neurological: Negative for dizziness, tremors, seizures, syncope, speech difficulty, weakness, light-headedness, numbness and headaches.  Hematological: Negative for adenopathy. Does not bruise/bleed easily.  Psychiatric/Behavioral: Negative for confusion, sleep disturbance and agitation. The patient is not nervous/anxious.    Physical Exam:  General - No distress ENT - No sinus tenderness, no oral exudate, no LAN, no thyromegaly, TM clear, pupils equal/reactive Cardiac - s1s2 regular, no murmur, pulses symmetric Chest - No wheeze/rales/dullness, good air entry, normal respiratory excursion Back - No focal tenderness Abd - Soft, non-tender, no organomegaly, + bowel sounds Ext - No edema Neuro - Normal strength, cranial nerves intact Skin - No rashes Psych - Normal mood, and behavior   Dg Chest 2 View  05/26/2013   CLINICAL DATA:  Right-sided discomfort  EXAM: CHEST  2 VIEW  COMPARISON:  DG CHEST 2 VIEW dated 01/16/2013  FINDINGS: The heart size and mediastinal contours are within normal limits. Both lungs are clear. The visualized skeletal structures are unremarkable.  IMPRESSION: No active cardiopulmonary disease.   Electronically Signed   By: Kathreen Devoid   On: 05/26/2013 10:31    Lab Results  Component Value Date   WBC 8.2 01/22/2013   HGB 15.4 01/22/2013   HCT 45.1 01/22/2013   MCV 92.5 01/22/2013   PLT 225.0 01/22/2013    Lab Results  Component Value Date   CREATININE 1.5 01/22/2013   BUN 17 01/22/2013   Larson 141 01/22/2013   K 4.6 01/22/2013   CL 102 01/22/2013   CO2 30 01/22/2013    Lab Results  Component Value Date   ALT 18 09/01/2012   AST 19  09/01/2012   ALKPHOS 86 09/01/2012   BILITOT 0.7 09/01/2012   BNP    Component Value Date/Time   PROBNP 326.5 01/16/2013 2124    Assessment/Plan:  Chesley Mires, MD  Pulmonary/Critical Care/Sleep Pager:  [phone]

## 2013-06-18 ENCOUNTER — Telehealth: Payer: Self-pay | Admitting: Pulmonary Disease

## 2013-06-18 NOTE — Telephone Encounter (Signed)
CMP     Component Value Date/Time   NA 136 06/16/2013 1243   K 4.7 06/16/2013 1243   CL 101 06/16/2013 1243   CO2 29 06/16/2013 1243   GLUCOSE 106* 06/16/2013 1243   BUN 14 06/16/2013 1243   CREATININE 1.2 06/16/2013 1243   CALCIUM 9.4 06/16/2013 1243   PROT 6.9 06/16/2013 1243   ALBUMIN 3.9 06/16/2013 1243   AST 22 06/16/2013 1243   ALT 21 06/16/2013 1243   ALKPHOS 111 06/16/2013 1243   BILITOT 0.9 06/16/2013 1243    CBC    Component Value Date/Time   WBC 6.2 06/16/2013 1243   RBC 4.90 06/16/2013 1243   HGB 15.2 06/16/2013 1243   HCT 45.6 06/16/2013 1243   PLT 229.0 06/16/2013 1243   MCV 93.1 06/16/2013 1243   MCHC 33.4 06/16/2013 1243   RDW 13.8 06/16/2013 1243   LYMPHSABS 1.0 06/16/2013 1243   MONOABS 0.3 06/16/2013 1243   EOSABS 0.2 06/16/2013 1243   BASOSABS 0.0 06/16/2013 1243    Will have my nurse inform pt that lab tests were normal.  Will call again after review of CT chest and PFT.

## 2013-06-18 NOTE — Assessment & Plan Note (Addendum)
I am not sure what is causing his current symptoms.  He has hx of CAD, HTN, and diastolic dysfunction >> these seem stable.  He has remote prior history of smoking.  Will arrange for PFT and CT chest to further evaluate obstructive lung disease and/or lung parenchymal disease.  He reports snoring and sleep disruption >> he may need sleep study to further assess.

## 2013-06-21 NOTE — Telephone Encounter (Signed)
Pt is aware of lab results.

## 2013-06-23 ENCOUNTER — Ambulatory Visit (INDEPENDENT_AMBULATORY_CARE_PROVIDER_SITE_OTHER)
Admission: RE | Admit: 2013-06-23 | Discharge: 2013-06-23 | Disposition: A | Discharge status: Home / Self Care | Payer: Medicare Other | Source: Ambulatory Visit | Attending: Pulmonary Disease | Admitting: Pulmonary Disease

## 2013-06-23 ENCOUNTER — Encounter: Payer: Self-pay | Admitting: Internal Medicine

## 2013-06-23 DIAGNOSIS — R0989 Other specified symptoms and signs involving the circulatory and respiratory systems: Secondary | ICD-10-CM

## 2013-06-23 DIAGNOSIS — R0600 Dyspnea, unspecified: Secondary | ICD-10-CM

## 2013-06-23 DIAGNOSIS — R0609 Other forms of dyspnea: Secondary | ICD-10-CM

## 2013-06-24 ENCOUNTER — Other Ambulatory Visit: Payer: Self-pay | Admitting: Nurse Practitioner

## 2013-06-29 ENCOUNTER — Encounter: Payer: Self-pay | Admitting: Internal Medicine

## 2013-06-29 ENCOUNTER — Telehealth: Payer: Self-pay | Admitting: Pulmonary Disease

## 2013-06-29 ENCOUNTER — Ambulatory Visit (INDEPENDENT_AMBULATORY_CARE_PROVIDER_SITE_OTHER): Payer: Medicare Other | Admitting: Internal Medicine

## 2013-06-29 VITALS — BP 124/76 | HR 76 | Temp 98.2°F | Wt 179.6 lb

## 2013-06-29 DIAGNOSIS — R0789 Other chest pain: Secondary | ICD-10-CM

## 2013-06-29 DIAGNOSIS — F5104 Psychophysiologic insomnia: Secondary | ICD-10-CM

## 2013-06-29 DIAGNOSIS — G309 Alzheimer's disease, unspecified: Principal | ICD-10-CM

## 2013-06-29 DIAGNOSIS — I10 Essential (primary) hypertension: Secondary | ICD-10-CM

## 2013-06-29 DIAGNOSIS — G4700 Insomnia, unspecified: Secondary | ICD-10-CM

## 2013-06-29 DIAGNOSIS — F0280 Dementia in other diseases classified elsewhere without behavioral disturbance: Secondary | ICD-10-CM

## 2013-06-29 DIAGNOSIS — K8020 Calculus of gallbladder without cholecystitis without obstruction: Secondary | ICD-10-CM | POA: Insufficient documentation

## 2013-06-29 DIAGNOSIS — F411 Generalized anxiety disorder: Secondary | ICD-10-CM

## 2013-06-29 MED ORDER — PREDNISONE 10 MG PO TABS: ORAL_TABLET | ORAL | Status: DC

## 2013-06-29 MED ORDER — MEMANTINE HCL ER 28 MG PO CP24
ORAL_CAPSULE | ORAL | Status: DC
Start: 2013-06-29 — End: 2013-07-26

## 2013-06-29 MED ORDER — ALPRAZOLAM 0.5 MG PO TABS: ORAL_TABLET | ORAL | Status: DC

## 2013-06-29 NOTE — Patient Instructions (Signed)
Continue medications as listed 

## 2013-06-29 NOTE — Progress Notes (Signed)
Patient ID: Phillip Larson, male   DOB: 1934/07/15, 79 y.o.   MRN: [id number]    Location:    [facility]  Place of Service:  OFFICE  Contact Information   Name Relation Home Work Mobile   Manolis,Peggy "Joycelyn Schmid" Spouse [phone]  [phone]        Allergies  Allergen Reactions  . Iodine Swelling    Internal iodine  . Iohexol Hives         Chief Complaint  Patient presents with  . Medical Managment of Chronic Issues    4 month f/u with no recent labs done. refills on Namenda and Alprazolam  . other    discuss report from lung doctor.    HPI:   ALZHEIMERS DISEASE - Using: Memantine HCl ER (NAMENDA XR) 28 MG CP24 every other day due to increase in the cost  Anxiety state, unspecified - benefits from ALPRAZolam (XANAX) 0.5 MG tablet  Chronic insomnia: improved  Chest discomfort: right side. Worse with seat belt on. Playing tennis 3 days peer week without significant dyspnea. Recent CT of the chest showed an area of inflammation in the right lower lung. He was started on prednisone by pulmonary, Dr. Halford Chessman.  Essential hypertension, benign: controlled  Cholelithiasis: incidental finding on CT chest 06/23/13. ? related to right side discomfort?    Medications: Patient's Medications  New Prescriptions   No medications on file  Previous Medications   ALPRAZOLAM (XANAX) 0.5 MG TABLET    Take 1/2 to 1 tablet as needed for anxiety   ASPIRIN EC 81 MG TABLET    Take 81 mg by mouth at bedtime.   B COMPLEX VITAMINS TABLET    Take 1 tablet by mouth daily.   CHOLECALCIFEROL (VITAMIN D) 1000 UNITS TABLET    Take 1,000 Units by mouth daily.   DILTIAZEM (CARDIZEM CD) 240 MG 24 HR CAPSULE    Take 1 capsule (240 mg total) by mouth daily.   DOCUSATE SODIUM (COLACE) 100 MG CAPSULE    Take 100 mg by mouth daily.    FINASTERIDE (PROSCAR) 5 MG TABLET    Take 1 tablet (5 mg total) by mouth daily.   GALANTAMINE (RAZADYNE ER) 24 MG 24 HR CAPSULE    Take 1 capsule (24 mg total) by mouth daily  with breakfast.   ISOSORBIDE MONONITRATE (IMDUR) 30 MG 24 HR TABLET    Take 15 mg daily cut the 30 mg in half   LEVOTHYROXINE (SYNTHROID, LEVOTHROID) 75 MCG TABLET    Take one tablet by mouth once daily for thyroid   LORATADINE (CLARITIN) 10 MG TABLET    Take 10 mg by mouth daily.   LOVASTATIN (MEVACOR) 40 MG TABLET    Take 1 tablet (40 mg total) by mouth at bedtime.   MEMANTINE HCL ER (NAMENDA XR) 28 MG CP24    Take 28 mg by mouth daily.   MULTIPLE VITAMIN (MULTIVITAMIN WITH MINERALS) TABS TABLET    Take 1 tablet by mouth daily.   NITROGLYCERIN (NITROSTAT) 0.4 MG SL TABLET    One under the tongue if needed for chest tightness. Repeat in 5 minutes if needed. Repeat again in 5 minutes if needed.   OMEGA-3 FATTY ACIDS (FISH OIL) 1200 MG CAPS    Take by mouth.   OMEPRAZOLE (PRILOSEC) 40 MG CAPSULE    Take 40 mg by mouth daily.   PREDNISONE (DELTASONE) 10 MG TABLET    4 pills for 2 days, 3 pills for 2 days, 2 pills for 2  days, 1 pill for 2 days   SPECIALTY VITAMINS PRODUCTS (MAGNESIUM, AMINO ACID CHELATE,) 133 MG TABLET    Take 1 tablet by mouth 1 day or 1 dose.   TAMSULOSIN (FLOMAX) 0.4 MG CAPS CAPSULE    Take 0.4 mg by mouth daily.   TRAZODONE (DESYREL) 50 MG TABLET    Take one tablet at bedtime to help with sleep   ZINC GLUCONATE 50 MG TABLET    Take 50 mg by mouth at bedtime.   ZOLPIDEM (AMBIEN) 10 MG TABLET    TAKE 1 TABLET AT BEDTIME AS NEEDED FOR SLEEP  Modified Medications   No medications on file  Discontinued Medications   No medications on file   SOCIAL HX Patient's wife accompanied him for this visit. In the course of the visit, she developed chest pain and I sent her to the ER after evaluation in this office.   Review of Systems  Constitutional: Negative.   HENT: Negative.   Eyes: Negative.   Respiratory: Positive for shortness of breath. Negative for cough, choking, chest tightness and wheezing.   Cardiovascular: Positive for chest pain (right side loer chest discomfort).  Negative for palpitations and leg swelling.  Gastrointestinal: Negative.   Endocrine: Negative.   Genitourinary: Positive for frequency.       Incontinent of urine at night Sometimes loses control at night. Hx BPH.  Using tamsulosin and finasteride.  Musculoskeletal: Positive for arthralgias.  Neurological: Negative for tremors, syncope, weakness and headaches.       Memory loss.  Hematological: Negative.   Psychiatric/Behavioral: Positive for sleep disturbance.    Filed Vitals:   06/29/13 1614  BP: 124/76  Pulse: 76  Temp: 98.2 F (36.8 C)  TempSrc: Oral  Weight: 179 lb 9.6 oz (81.466 kg)   Physical Exam  Constitutional: He appears well-developed and well-nourished. No distress.  HENT:  Head: Normocephalic and atraumatic.  Right Ear: External ear normal.  Left Ear: External ear normal.  Nose: Nose normal.  Eyes: Conjunctivae and EOM are normal. Pupils are equal, round, and reactive to light.  Neck: Normal range of motion. Neck supple. No JVD present. No tracheal deviation present. No thyromegaly present.  Cardiovascular: Normal rate, regular rhythm, normal heart sounds and intact distal pulses.  Exam reveals no gallop and no friction rub.   No murmur heard. Pulmonary/Chest: Effort normal and breath sounds normal. No respiratory distress. He has no wheezes. He has no rales. He exhibits no tenderness.  No focal pain on palpation.  Abdominal: Soft. Bowel sounds are normal. He exhibits no distension and no mass. There is no tenderness.  Long-term right upper abdomen lump that seems most consistent with lipoma and that is unlikely to be a ventral hernia, but I cannot fully rule this out.  Musculoskeletal: Normal range of motion. He exhibits no edema and no tenderness.  Lymphadenopathy:    He has no cervical adenopathy.  Neurological: No cranial nerve deficit. Coordination normal.  Skin: Skin is warm and dry. No rash noted. No erythema. No pallor.  Area suspicious for lipoma under  the ribs and the right upper abdomen.  Psychiatric: He has a normal mood and affect. His behavior is normal. Thought content normal.  Memory loss. Increased anxiety. Not depressed.     Labs reviewed: Appointment on 06/16/2013  Component Date Value Ref Range Status  . Sodium 06/16/2013 136  135 - 145 mEq/L Final  . Potassium 06/16/2013 4.7  3.5 - 5.1 mEq/L Final  . Chloride 06/16/2013 101  96 - 112 mEq/L Final  . CO2 06/16/2013 29  19 - 32 mEq/L Final  . Glucose, Bld 06/16/2013 106* 70 - 99 mg/dL Final  . BUN 06/16/2013 14  6 - 23 mg/dL Final  . Creatinine, Ser 06/16/2013 1.2  0.4 - 1.5 mg/dL Final  . Total Bilirubin 06/16/2013 0.9  0.3 - 1.2 mg/dL Final  . Alkaline Phosphatase 06/16/2013 111  39 - 117 U/L Final  . AST 06/16/2013 22  0 - 37 U/L Final  . ALT 06/16/2013 21  0 - 53 U/L Final  . Total Protein 06/16/2013 6.9  6.0 - 8.3 g/dL Final  . Albumin 06/16/2013 3.9  3.5 - 5.2 g/dL Final  . Calcium 06/16/2013 9.4  8.4 - 10.5 mg/dL Final  . GFR 06/16/2013 63.06  >60.00 mL/min Final  . WBC 06/16/2013 6.2  4.5 - 10.5 K/uL Final  . RBC 06/16/2013 4.90  4.22 - 5.81 Mil/uL Final  . Hemoglobin 06/16/2013 15.2  13.0 - 17.0 g/dL Final  . HCT 06/16/2013 45.6  39.0 - 52.0 % Final  . MCV 06/16/2013 93.1  78.0 - 100.0 fl Final  . MCHC 06/16/2013 33.4  30.0 - 36.0 g/dL Final  . RDW 06/16/2013 13.8  11.5 - 14.6 % Final  . Platelets 06/16/2013 229.0  150.0 - 400.0 K/uL Final  . Neutrophils Relative % 06/16/2013 74.9  43.0 - 77.0 % Final  . Lymphocytes Relative 06/16/2013 16.8  12.0 - 46.0 % Final  . Monocytes Relative 06/16/2013 5.1  3.0 - 12.0 % Final  . Eosinophils Relative 06/16/2013 3.0  0.0 - 5.0 % Final  . Basophils Relative 06/16/2013 0.2  0.0 - 3.0 % Final  . Neutro Abs 06/16/2013 4.7  1.4 - 7.7 K/uL Final  . Lymphs Abs 06/16/2013 1.0  0.7 - 4.0 K/uL Final  . Monocytes Absolute 06/16/2013 0.3  0.1 - 1.0 K/uL Final  . Eosinophils Absolute 06/16/2013 0.2  0.0 - 0.7 K/uL Final  .  Basophils Absolute 06/16/2013 0.0  0.0 - 0.1 K/uL Final  Office Visit on 05/26/2013  Component Date Value Ref Range Status  . HM Colonoscopy 11/08/2008 polyps, Dr Michail Sermon   Final      Assessment/Plan  1. ALZHEIMERS DISEASE - Memantine HCl ER (NAMENDA XR) 28 MG CP24; Take 1 tablet every other day to help preserve memory  Dispense: 30 capsule; Refill: 3  2. Anxiety state, unspecified - ALPRAZolam (XANAX) 0.5 MG tablet; Take 1/2 to 1 tablet as needed for anxiety  Dispense: 30 tablet; Refill: 0  3. Chronic insomnia Chronic. No changed in medication  4. Chest discomfort Uncertain etiology. He will take the steroids and see if the pain improves. If not, consider referral to surgeon for the cholelithiasis. I am not certain this is related to the pains, but it is possible.  5. Essential hypertension, benign controlled  6. Cholelithiasis Refer to surgeon if pains persist in the right lower lung/ RUQ of abd.

## 2013-06-29 NOTE — Telephone Encounter (Signed)
06/23/2013    CLINICAL DATA:  Former smoker or shortness of breath.   EXAM: CT CHEST WITHOUT CONTRAST   TECHNIQUE: Multidetector CT imaging of the chest was performed following the standard protocol without IV contrast.  COMPARISON:  CT ANGIO CHEST W/CM &/OR WO/CM dated 07/21/2006   FINDINGS:  No pathologically enlarged mediastinal or axillary lymph nodes. Hilar regions are difficult to definitively evaluate without IV contrast but appear grossly unremarkable. Three-vessel coronary artery calcification. Heart size within normal limits. No pericardial effusion.  Mild scattered peribronchovascular ground-glass airspace disease in the right lower lobe. Scattered pulmonary parenchymal scarring in the lower lobes. Lungs are otherwise clear. No pleural fluid. Airway is unremarkable.  Incidental imaging of the upper abdomen shows an 11 mm low-attenuation lesion in the left hepatic lobe, as before. Stones are seen dependently in the gallbladder. Visualized portions of the adrenal glands, kidneys, spleen, pancreas, stomach and bowel are otherwise grossly unremarkable. No upper abdominal adenopathy. No worrisome lytic or sclerotic lesions. Degenerative changes are seen in the spine.   IMPRESSION:  1. Scattered peribronchovascular ground-glass in the right lower lobe is likely infectious or inflammatory in etiology.  2. Three-vessel coronary artery calcification.  3. Cholelithiasis.    Electronically Signed   By: Lorin Picket M.D.   On: 06/23/2013 10:32    Results d/w pt over phone.  RLL areas of GGO unlikely to be infection given his symptom description.  Therefore most likely inflammatory, and mild.  Location on CT chest does correlate with his symptoms of chest discomfort.    Will give him course of prednisone 10 mg pills >> 4 pills for 2 days, 3 pills for 2 days, 2 pills for 2 days, 1 pill for 2 days then stop prednisone.    He has f/u on July 29, 2013.  Advised will re-assess his symptom status then, and  decide when he will need f/u CT chest.

## 2013-07-20 ENCOUNTER — Ambulatory Visit (INDEPENDENT_AMBULATORY_CARE_PROVIDER_SITE_OTHER): Payer: Medicare Other | Admitting: Pulmonary Disease

## 2013-07-20 DIAGNOSIS — R0600 Dyspnea, unspecified: Secondary | ICD-10-CM

## 2013-07-20 DIAGNOSIS — R0989 Other specified symptoms and signs involving the circulatory and respiratory systems: Secondary | ICD-10-CM

## 2013-07-20 DIAGNOSIS — R0609 Other forms of dyspnea: Secondary | ICD-10-CM

## 2013-07-20 NOTE — Progress Notes (Signed)
PFT done today. 

## 2013-07-26 ENCOUNTER — Other Ambulatory Visit: Payer: Self-pay

## 2013-07-26 DIAGNOSIS — F0280 Dementia in other diseases classified elsewhere without behavioral disturbance: Secondary | ICD-10-CM

## 2013-07-26 DIAGNOSIS — G309 Alzheimer's disease, unspecified: Principal | ICD-10-CM

## 2013-07-26 MED ORDER — MEMANTINE HCL ER 28 MG PO CP24: ORAL_CAPSULE | ORAL | Status: DC

## 2013-07-29 ENCOUNTER — Ambulatory Visit (INDEPENDENT_AMBULATORY_CARE_PROVIDER_SITE_OTHER): Payer: Medicare Other | Admitting: Pulmonary Disease

## 2013-07-29 ENCOUNTER — Other Ambulatory Visit: Payer: Self-pay | Admitting: *Deleted

## 2013-07-29 ENCOUNTER — Other Ambulatory Visit: Payer: Self-pay | Admitting: Internal Medicine

## 2013-07-29 ENCOUNTER — Encounter: Payer: Self-pay | Admitting: Pulmonary Disease

## 2013-07-29 VITALS — BP 140/102 | HR 72 | Ht 71.0 in | Wt 181.0 lb

## 2013-07-29 DIAGNOSIS — R0789 Other chest pain: Secondary | ICD-10-CM

## 2013-07-29 MED ORDER — ALPRAZOLAM 0.5 MG PO TABS: 0.5000 mg | ORAL_TABLET | ORAL | Status: DC | PRN

## 2013-07-29 NOTE — Progress Notes (Signed)
Chief Complaint  Patient presents with  . Shortness of Breath    Breathing has improved since last OV. PFT was done on 07/20/13.    History of Present Illness: Phillip Larson is a 79 y.o. male former smoker with right sided chest pain and dyspnea.  He was tx with prednisone in March after finding areas of GGO on CT chest in RLL.  He has been doing better.  He is sleeping better, and rt sided chest pain has improved.  He denies cough, wheeze, sputum, or fever.  PFT from 07/20/13 showed mild diffusion defect that corrects for lung volumes >> otherwise normal.  TESTS: CT chest3/31/08 >> no PE, coronary calcifications, subsegmental atelectasis  Lt and Rt heart cath 08/07/06 >> stable CAD, RA 4, RV 35/4, PA 31/10, PCWP 5, LVEDP 135/11, CI 3.3  Echo 12/09/11 >> mild LVH, EF 55 to 93%, grade 2 diastolic dysfunction, mild LA/RA dilation, PAS 32 mmHg  Spirometry 03/13/12 >> FEV1 2.97 (86%), FEV1% 78  Echo 12/03/12 >> mild LVH, EF 60 to 81%, grade 1 diastolic dysfx, mild AS, mild LA dilation CT chest 06/23/13 >> Mild scattered peribronchovascular ground-glass airspace disease in the right lower lobe PFT 07/20/13 >> FEV1 3.18 (115%), FEV1% 81, TLC 6.96 (104%), DLCO 76%   Phillip Larson  has a past medical history of Mixed hyperlipidemia; ALZHEIMERS DISEASE; Essential hypertension, benign; CAD; BENIGN PROSTATIC HYPERTROPHY, HX OF; Impacted cerumen; Anxiety state, unspecified; Pressure ulcer; Actinic keratosis; Enthesopathy of hip region; Unspecified hereditary and idiopathic peripheral neuropathy; Benign neoplasm of colon; Unspecified vitamin D deficiency; Other premature beats; Diverticulosis of colon (without mention of hemorrhage); Unspecified hypothyroidism; Coronary atherosclerosis of native coronary artery; Urinary obstruction, unspecified; Actinic keratosis; Pain in joint, site unspecified; Osteoarthrosis, unspecified whether generalized or localized, unspecified site; Peptic ulcer, unspecified site,  unspecified as acute or chronic, without mention of hemorrhage, perforation, or obstruction; Other and unspecified hyperlipidemia; Alzheimer's disease; Myalgia and myositis, unspecified; Insomnia, unspecified; Other malaise and fatigue; External hemorrhoids without mention of complication; Anal fissure; Other psoriasis; Unspecified essential hypertension; Depressive disorder, not elsewhere classified; Cerebrovascular disease, unspecified; Acute, but ill-defined, cerebrovascular disease; Reflux esophagitis; Pain in joint, lower leg; Palpitations; Shortness of breath; Coronary atherosclerosis of native coronary artery; Impacted cerumen ([id number]); Pressure ulcer of buttock, unstageable; Unspecified hereditary and idiopathic peripheral neuropathy; Urinary obstruction, unspecified; Actinic keratosis; Osteoarthrosis, unspecified whether generalized or localized, unspecified site; Peptic ulcer, unspecified site, unspecified as acute or chronic, without mention of hemorrhage, perforation, or obstruction; Myalgia and myositis, unspecified; Insomnia, unspecified; Other malaise and fatigue; External hemorrhoids without mention of complication; Anal fissure; Depressive disorder, not elsewhere classified; Cerebrovascular disease, unspecified; Acute, but ill-defined, cerebrovascular disease; and Palpitations.  Phillip Larson  has past surgical history that includes Knee surgery (07/18/2006); Cervical disc surgery; LAMINECTOMY C3 disc (1972); Retinal tear repair cryotherapy; and Multiple tooth extractions (2014).  Prior to Admission medications   Medication Sig Start Date End Date Taking? Authorizing Provider  ALPRAZolam Duanne Moron) 0.5 MG tablet Take 1 tablet (0.5 mg total) by mouth as needed for anxiety. 07/29/13  Yes Tiffany L Reed, DO  aspirin EC 81 MG tablet Take 81 mg by mouth at bedtime.   Yes Historical Provider, MD  b complex vitamins tablet Take 1 tablet by mouth daily.   Yes Historical Provider, MD  cholecalciferol  (VITAMIN D) 1000 UNITS tablet Take 1,000 Units by mouth daily.   Yes Historical Provider, MD  diltiazem (CARDIZEM CD) 240 MG 24 hr capsule Take 1 capsule (240 mg total)  by mouth daily. 02/19/13  Yes Josue Hector, MD  docusate sodium (COLACE) 100 MG capsule Take 100 mg by mouth daily.    Yes Historical Provider, MD  finasteride (PROSCAR) 5 MG tablet Take 1 tablet (5 mg total) by mouth daily. 04/29/13  Yes Pricilla Larsson, NP  galantamine (RAZADYNE ER) 24 MG 24 hr capsule Take 1 capsule (24 mg total) by mouth daily with breakfast. 05/26/13  Yes Pricilla Larsson, NP  isosorbide mononitrate (IMDUR) 30 MG 24 hr tablet Take 15 mg daily cut the 30 mg in half 01/29/13  Yes Josue Hector, MD  levothyroxine (SYNTHROID, LEVOTHROID) 75 MCG tablet Take one tablet by mouth once daily for thyroid 04/29/13  Yes Pricilla Larsson, NP  loratadine (CLARITIN) 10 MG tablet Take 10 mg by mouth daily.   Yes Historical Provider, MD  lovastatin (MEVACOR) 40 MG tablet Take 1 tablet (40 mg total) by mouth at bedtime. 04/29/13  Yes Pricilla Larsson, NP  Memantine HCl ER (NAMENDA XR) 28 MG CP24 Take 1 tablet every other day to help preserve memory 07/26/13  Yes Estill Dooms, MD  Multiple Vitamin (MULTIVITAMIN WITH MINERALS) TABS tablet Take 1 tablet by mouth daily.   Yes Historical Provider, MD  nitroGLYCERIN (NITROSTAT) 0.4 MG SL tablet One under the tongue if needed for chest tightness. Repeat in 5 minutes if needed. Repeat again in 5 minutes if needed. 02/03/13  Yes Estill Dooms, MD  Omega-3 Fatty Acids (FISH OIL) 1200 MG CAPS Take by mouth.   Yes Historical Provider, MD  omeprazole (PRILOSEC) 40 MG capsule Take 40 mg by mouth daily.   Yes Historical Provider, MD  Specialty Vitamins Products (MAGNESIUM, AMINO ACID CHELATE,) 133 MG tablet Take 1 tablet by mouth 1 day or 1 dose.   Yes Historical Provider, MD  tamsulosin (FLOMAX) 0.4 MG CAPS capsule Take 0.4 mg by mouth daily.   Yes Historical Provider, MD  traZODone (DESYREL) 50 MG  tablet Take one tablet at bedtime to help with sleep 04/29/13  Yes Pricilla Larsson, NP  zinc gluconate 50 MG tablet Take 50 mg by mouth at bedtime.   Yes Historical Provider, MD  zolpidem (AMBIEN) 10 MG tablet TAKE 1 TABLET AT BEDTIME AS NEEDED FOR SLEEP 06/24/13  Yes Pricilla Larsson, NP    Allergies  Allergen Reactions  . Iodine Swelling    Internal iodine  . Iohexol Hives          Physical Exam:  General - No distress ENT - No sinus tenderness, no oral exudate, no LAN Cardiac - s1s2 regular, no murmur Chest - No wheeze/rales/dullness Back - No focal tenderness Abd - Soft, non-tender Ext - No edema Neuro - Normal strength Skin - No rashes Psych - normal mood, and behavior   Assessment/Plan:  Chesley Mires, MD [facility] Pager:  [phone]

## 2013-07-29 NOTE — Patient Instructions (Signed)
Follow up in 3 months

## 2013-07-29 NOTE — Assessment & Plan Note (Signed)
He was found to have faint area of GGO in RLL on CT chest.  He has good clinical response with almost complete resolution of symptoms after course of prednisone.  I am uncertain what caused this, but given response to prednisone this was likely inflammatory in nature.  For now will continue clinical observation, and f/u in 3 months.  Will then determine when he might need follow up CT chest.

## 2013-07-30 ENCOUNTER — Other Ambulatory Visit: Payer: Self-pay | Admitting: *Deleted

## 2013-07-30 MED ORDER — TRAZODONE HCL 100 MG PO TABS: ORAL_TABLET | ORAL | Status: DC

## 2013-07-30 NOTE — Telephone Encounter (Signed)
Pharmacy called and stated that insurance company will not cover Ambien and wants it switched to Trazodone. Dr. Nyoka Cowden approved and wanted me to call patient to confirm with him. Patient stated this was fine he would try it. Told patient to finish his supply of the Ambien first and then start the Trazodone. He agreed. Faxed it to his pharmacy

## 2013-08-24 ENCOUNTER — Other Ambulatory Visit: Payer: Self-pay | Admitting: Nurse Practitioner

## 2013-08-24 ENCOUNTER — Ambulatory Visit (INDEPENDENT_AMBULATORY_CARE_PROVIDER_SITE_OTHER): Payer: Medicare Other | Admitting: Internal Medicine

## 2013-08-24 ENCOUNTER — Other Ambulatory Visit: Payer: Self-pay | Admitting: Internal Medicine

## 2013-08-24 ENCOUNTER — Encounter: Payer: Self-pay | Admitting: Internal Medicine

## 2013-08-24 VITALS — BP 126/74 | HR 69 | Temp 97.3°F | Wt 177.6 lb

## 2013-08-24 DIAGNOSIS — H6120 Impacted cerumen, unspecified ear: Secondary | ICD-10-CM | POA: Insufficient documentation

## 2013-08-24 NOTE — Progress Notes (Signed)
Patient ID: Phillip Larson, male   DOB: Oct 16, 1934, 79 y.o.   MRN: [id number]    Location:  [facility]   Place of Service: OFFICE    Allergies  Allergen Reactions  . Iodine Swelling    Internal iodine  . Iohexol Hives         Chief Complaint  Patient presents with  . Ear Problem    Examine both ears- feels stopped up. Patient denies pain in ears     HPI:  Impacted cerumen: bilateral    Medications: Patient's Medications  New Prescriptions   No medications on file  Previous Medications   ALPRAZOLAM (XANAX) 0.5 MG TABLET    Take 1 tablet (0.5 mg total) by mouth as needed for anxiety.   ASPIRIN EC 81 MG TABLET    Take 81 mg by mouth at bedtime.   B COMPLEX VITAMINS TABLET    Take 1 tablet by mouth daily.   CHOLECALCIFEROL (VITAMIN D) 1000 UNITS TABLET    Take 1,000 Units by mouth daily.   DILTIAZEM (CARDIZEM CD) 240 MG 24 HR CAPSULE    Take 1 capsule (240 mg total) by mouth daily.   DOCUSATE SODIUM (COLACE) 100 MG CAPSULE    Take 100 mg by mouth daily.    FINASTERIDE (PROSCAR) 5 MG TABLET    Take 1 tablet (5 mg total) by mouth daily.   GALANTAMINE (RAZADYNE ER) 24 MG 24 HR CAPSULE    Take 1 capsule (24 mg total) by mouth daily with breakfast.   ISOSORBIDE MONONITRATE (IMDUR) 30 MG 24 HR TABLET    Take 15 mg daily cut the 30 mg in half   LEVOTHYROXINE (SYNTHROID, LEVOTHROID) 75 MCG TABLET    Take one tablet by mouth once daily for thyroid   LORATADINE (CLARITIN) 10 MG TABLET    Take 10 mg by mouth daily.   LOVASTATIN (MEVACOR) 40 MG TABLET    Take 1 tablet (40 mg total) by mouth at bedtime.   MEMANTINE HCL ER (NAMENDA XR) 28 MG CP24    Take 1 tablet every other day to help preserve memory   MULTIPLE VITAMIN (MULTIVITAMIN WITH MINERALS) TABS TABLET    Take 1 tablet by mouth daily.   NITROGLYCERIN (NITROSTAT) 0.4 MG SL TABLET    One under the tongue if needed for chest tightness. Repeat in 5 minutes if needed. Repeat again in 5 minutes if needed.   OMEGA-3 FATTY ACIDS (FISH OIL)  1200 MG CAPS    Take by mouth.   OMEPRAZOLE (PRILOSEC) 40 MG CAPSULE    Take 40 mg by mouth daily.   PREDNISONE (DELTASONE) 10 MG TABLET       SPECIALTY VITAMINS PRODUCTS (MAGNESIUM, AMINO ACID CHELATE,) 133 MG TABLET    Take 1 tablet by mouth 1 day or 1 dose.   TAMSULOSIN (FLOMAX) 0.4 MG CAPS CAPSULE    Take 0.4 mg by mouth daily.   TRAZODONE (DESYREL) 100 MG TABLET    Take one tablet by mouth once at bedtime to help rest   ZINC GLUCONATE 50 MG TABLET    Take 50 mg by mouth at bedtime.   ZOLPIDEM (AMBIEN) 10 MG TABLET      Modified Medications   No medications on file  Discontinued Medications   TRAZODONE (DESYREL) 50 MG TABLET    Take one tablet at bedtime to help with sleep     Review of Systems  Constitutional: Negative.   HENT: Positive for hearing loss. Negative for ear  pain.   Respiratory: Negative.   Cardiovascular: Negative.   Gastrointestinal: Negative.     Filed Vitals:   08/24/13 1425  BP: 126/74  Pulse: 69  Temp: 97.3 F (36.3 C)  TempSrc: Oral  Weight: 177 lb 9.6 oz (80.559 kg)  SpO2: 96%   Physical Exam  Constitutional: He appears well-developed and well-nourished. No distress.  HENT:  Bilateral cerumen impaction. Loss of hearing.  Neck: No tracheal deviation present. No thyromegaly present.  Cardiovascular: Normal rate, regular rhythm and intact distal pulses.  Exam reveals friction rub. Exam reveals no gallop.   No murmur heard. Pulmonary/Chest: No respiratory distress. He has no wheezes. He has no rales.  Lymphadenopathy:    He has no cervical adenopathy.     Labs reviewed: Clinical Support on 07/20/2013  Component Date Value Ref Range Status  . FVC-Pre 07/20/2013 4.06   Preliminary  . FVC-%Pred-Pre 07/20/2013 106   Preliminary  . FVC-Post 07/20/2013 3.95   Preliminary  . FVC-%Pred-Post 07/20/2013 103   Preliminary  . FVC-%Change-Post 07/20/2013 -2   Preliminary  . FEV1-Pre 07/20/2013 3.07   Preliminary  . FEV1-%Pred-Pre 07/20/2013 111    Preliminary  . FEV1-Post 07/20/2013 3.18   Preliminary  . FEV1-%Pred-Post 07/20/2013 115   Preliminary  . FEV1-%Change-Post 07/20/2013 3   Preliminary  . FEV6-Pre 07/20/2013 4.01   Preliminary  . FEV6-%Pred-Pre 07/20/2013 111   Preliminary  . FEV6-Post 07/20/2013 3.89   Preliminary  . FEV6-%Pred-Post 07/20/2013 108   Preliminary  . FEV6-%Change-Post 07/20/2013 -2   Preliminary  . Pre FEV1/FVC ratio 07/20/2013 75   Preliminary  . FEV1FVC-%Pred-Pre 07/20/2013 104   Preliminary  . Post FEV1/FVC ratio 07/20/2013 81   Preliminary  . FEV1FVC-%Change-Post 07/20/2013 6   Preliminary  . Pre FEV6/FVC Ratio 07/20/2013 99   Preliminary  . FEV6FVC-%Pred-Pre 07/20/2013 106   Preliminary  . Post FEV6/FVC ratio 07/20/2013 98   Preliminary  . FEV6FVC-%Pred-Post 07/20/2013 105   Preliminary  . FEV6FVC-%Change-Post 07/20/2013 0   Preliminary  . FEF 25-75 Pre 07/20/2013 2.31   Preliminary  . FEF2575-%Pred-Pre 07/20/2013 118   Preliminary  . FEF 25-75 Post 07/20/2013 2.31   Preliminary  . FEF2575-%Pred-Post 07/20/2013 118   Preliminary  . FEF2575-%Change-Post 07/20/2013 0   Preliminary  . RV 07/20/2013 2.61   Preliminary  . RV % pred 07/20/2013 105   Preliminary  . TLC 07/20/2013 6.96   Preliminary  . TLC % pred 07/20/2013 104   Preliminary  . DLCO unc 07/20/2013 22.68   Preliminary  . DLCO unc % pred 07/20/2013 76   Preliminary  . DL/VA 07/20/2013 3.65   Preliminary  . DL/VA % pred 07/20/2013 81   Preliminary  Appointment on 06/16/2013  Component Date Value Ref Range Status  . Sodium 06/16/2013 136  135 - 145 mEq/L Final  . Potassium 06/16/2013 4.7  3.5 - 5.1 mEq/L Final  . Chloride 06/16/2013 101  96 - 112 mEq/L Final  . CO2 06/16/2013 29  19 - 32 mEq/L Final  . Glucose, Bld 06/16/2013 106* 70 - 99 mg/dL Final  . BUN 06/16/2013 14  6 - 23 mg/dL Final  . Creatinine, Ser 06/16/2013 1.2  0.4 - 1.5 mg/dL Final  . Total Bilirubin 06/16/2013 0.9  0.3 - 1.2 mg/dL Final  . Alkaline Phosphatase  06/16/2013 111  39 - 117 U/L Final  . AST 06/16/2013 22  0 - 37 U/L Final  . ALT 06/16/2013 21  0 - 53 U/L Final  .  Total Protein 06/16/2013 6.9  6.0 - 8.3 g/dL Final  . Albumin 06/16/2013 3.9  3.5 - 5.2 g/dL Final  . Calcium 06/16/2013 9.4  8.4 - 10.5 mg/dL Final  . GFR 06/16/2013 63.06  >60.00 mL/min Final  . WBC 06/16/2013 6.2  4.5 - 10.5 K/uL Final  . RBC 06/16/2013 4.90  4.22 - 5.81 Mil/uL Final  . Hemoglobin 06/16/2013 15.2  13.0 - 17.0 g/dL Final  . HCT 06/16/2013 45.6  39.0 - 52.0 % Final  . MCV 06/16/2013 93.1  78.0 - 100.0 fl Final  . MCHC 06/16/2013 33.4  30.0 - 36.0 g/dL Final  . RDW 06/16/2013 13.8  11.5 - 14.6 % Final  . Platelets 06/16/2013 229.0  150.0 - 400.0 K/uL Final  . Neutrophils Relative % 06/16/2013 74.9  43.0 - 77.0 % Final  . Lymphocytes Relative 06/16/2013 16.8  12.0 - 46.0 % Final  . Monocytes Relative 06/16/2013 5.1  3.0 - 12.0 % Final  . Eosinophils Relative 06/16/2013 3.0  0.0 - 5.0 % Final  . Basophils Relative 06/16/2013 0.2  0.0 - 3.0 % Final  . Neutro Abs 06/16/2013 4.7  1.4 - 7.7 K/uL Final  . Lymphs Abs 06/16/2013 1.0  0.7 - 4.0 K/uL Final  . Monocytes Absolute 06/16/2013 0.3  0.1 - 1.0 K/uL Final  . Eosinophils Absolute 06/16/2013 0.2  0.0 - 0.7 K/uL Final  . Basophils Absolute 06/16/2013 0.0  0.0 - 0.1 K/uL Final      Assessment/Plan  Impacted cerumen: bilateral ear lavage.

## 2013-08-26 ENCOUNTER — Encounter: Payer: Self-pay | Admitting: Cardiovascular Disease

## 2013-08-26 ENCOUNTER — Ambulatory Visit (INDEPENDENT_AMBULATORY_CARE_PROVIDER_SITE_OTHER): Payer: Medicare Other | Admitting: Cardiovascular Disease

## 2013-08-26 ENCOUNTER — Other Ambulatory Visit: Payer: Self-pay | Admitting: Internal Medicine

## 2013-08-26 VITALS — BP 130/72 | HR 74 | Ht 71.0 in | Wt 172.2 lb

## 2013-08-26 DIAGNOSIS — R001 Bradycardia, unspecified: Secondary | ICD-10-CM

## 2013-08-26 DIAGNOSIS — I498 Other specified cardiac arrhythmias: Secondary | ICD-10-CM

## 2013-08-26 DIAGNOSIS — I2510 Atherosclerotic heart disease of native coronary artery without angina pectoris: Secondary | ICD-10-CM

## 2013-08-26 DIAGNOSIS — E782 Mixed hyperlipidemia: Secondary | ICD-10-CM

## 2013-08-26 DIAGNOSIS — I10 Essential (primary) hypertension: Secondary | ICD-10-CM

## 2013-08-26 NOTE — Progress Notes (Signed)
Patient ID: Phillip Larson, male   DOB: January 28, 1935, 79 y.o.   MRN: [id number] Phillip Larson is seen today for followup of his blood pressure.. He has been more active. He is watching the salt in his diet. He also has hypercholesterolemia Labs 10/23/11 reviewed Cr 1.22, normal LFTS and LDL 91 HDL 56 TC 202 He has CAD with a total RCA that is collaterized. He had a low risk myovue in 2009 .  SSCP increased earlier this month Cath done 10/9 from right radial approach and no changes with chronically occluded RCA , robust collaterals and no significant left sided disease   Has had some dyspnea on his right side and ? cholilitiasis  Seems to be improving    ROS: Denies fever, malais, weight loss, blurry vision, decreased visual acuity, cough, sputum, SOB, hemoptysis, pleuritic pain, palpitaitons, heartburn, abdominal pain, melena, lower extremity edema, claudication, or rash.  All other systems reviewed and negative  General: Affect appropriate Healthy:  appears stated age 79: normal Neck supple with no adenopathy JVP normal no bruits no thyromegaly Lungs clear with no wheezing and good diaphragmatic motion Heart:  S1/S2 no murmur, no rub, gallop or click PMI normal Abdomen: benighn, BS positve, no tenderness, no AAA no bruit.  No HSM or HJR Distal pulses intact with no bruits No edema Neuro non-focal Skin warm and dry No muscular weakness   Current Outpatient Prescriptions  Medication Sig Dispense Refill  . ALPRAZolam (XANAX) 0.5 MG tablet Take 1 tablet (0.5 mg total) by mouth as needed for anxiety.  30 tablet  0  . aspirin EC 81 MG tablet Take 81 mg by mouth at bedtime.      [name] b complex vitamins tablet Take 1 tablet by mouth daily.      . cholecalciferol (VITAMIN D) 1000 UNITS tablet Take 1,000 Units by mouth daily.      [name] diltiazem (CARDIZEM CD) 240 MG 24 hr capsule Take 1 capsule (240 mg total) by mouth daily.  90 capsule  3  . docusate sodium (COLACE) 100 MG capsule Take 100 mg by mouth  daily.       . finasteride (PROSCAR) 5 MG tablet Take 1 tablet (5 mg total) by mouth daily.  90 tablet  3  . galantamine (RAZADYNE ER) 24 MG 24 hr capsule Take 1 capsule (24 mg total) by mouth daily with breakfast.  30 capsule  2  . isosorbide mononitrate (IMDUR) 30 MG 24 hr tablet Take 15 mg daily cut the 30 mg in half  90 tablet  3  . levothyroxine (SYNTHROID, LEVOTHROID) 75 MCG tablet Take one tablet by mouth once daily for thyroid  90 tablet  3  . loratadine (CLARITIN) 10 MG tablet Take 10 mg by mouth daily.      [name] lovastatin (MEVACOR) 40 MG tablet Take 1 tablet (40 mg total) by mouth at bedtime.  90 tablet  3  . Memantine HCl ER (NAMENDA XR) 28 MG CP24 Take 1 tablet every other day to help preserve memory  30 capsule  3  . Multiple Vitamin (MULTIVITAMIN WITH MINERALS) TABS tablet Take 1 tablet by mouth daily.      . nitroGLYCERIN (NITROSTAT) 0.4 MG SL tablet One under the tongue if needed for chest tightness. Repeat in 5 minutes if needed. Repeat again in 5 minutes if needed.  25 tablet  12  . Omega-3 Fatty Acids (FISH OIL) 1200 MG CAPS Take by mouth.      [name] omeprazole (PRILOSEC) 40  MG capsule Take 40 mg by mouth daily.      . predniSONE (DELTASONE) 10 MG tablet       . Specialty Vitamins Products (MAGNESIUM, AMINO ACID CHELATE,) 133 MG tablet Take 1 tablet by mouth 1 day or 1 dose.      . tamsulosin (FLOMAX) 0.4 MG CAPS capsule Take 0.4 mg by mouth daily.      . tamsulosin (FLOMAX) 0.4 MG CAPS capsule TAKE ONE CAPSULE BY MOUTH EVERY DAY TO HELP RELIEVE OBSTRUCTION  30 capsule  6  . traZODone (DESYREL) 100 MG tablet Take one tablet by mouth once at bedtime to help rest  90 tablet  3  . zinc gluconate 50 MG tablet Take 50 mg by mouth at bedtime.      [name] zolpidem (AMBIEN) 10 MG tablet TABLET BY MOUTH AT BEDTIME AS NEEDED FOR SLEEP  30 tablet  1   No current facility-administered medications for this visit.    Allergies  Iodine and Iohexol  Electrocardiogram:  SR rate 45 prominent lateral  T waves   Assessment and Plan

## 2013-08-26 NOTE — Assessment & Plan Note (Signed)
Stable with no angina and good activity level.  Continue medical Rx  

## 2013-08-26 NOTE — Assessment & Plan Note (Signed)
Well controlled.  Continue current medications and low sodium Dash type diet.    

## 2013-08-26 NOTE — Patient Instructions (Signed)
Your physician wants you to follow-up in:  6 MONTHS WITH DR NISHAN  You will receive a reminder letter in the mail two months in advance. If you don't receive a letter, please call our office to schedule the follow-up appointment. Your physician recommends that you continue on your current medications as directed. Please refer to the Current Medication list given to you today. 

## 2013-08-26 NOTE — Assessment & Plan Note (Signed)
Cholesterol is at goal.  Continue current dose of statin and diet Rx.  No myalgias or side effects.  F/U  LFT's in 6 months. No results found for this basename: [place]   Labs with art green

## 2013-08-26 NOTE — Assessment & Plan Note (Signed)
Resolved Avoid beta blockers

## 2013-09-06 ENCOUNTER — Other Ambulatory Visit: Payer: Self-pay | Admitting: Nurse Practitioner

## 2013-09-06 ENCOUNTER — Telehealth: Payer: Self-pay

## 2013-09-06 DIAGNOSIS — G309 Alzheimer's disease, unspecified: Principal | ICD-10-CM

## 2013-09-06 DIAGNOSIS — F0280 Dementia in other diseases classified elsewhere without behavioral disturbance: Secondary | ICD-10-CM

## 2013-09-06 MED ORDER — MEMANTINE HCL ER 28 MG PO CP24: ORAL_CAPSULE | ORAL | Status: DC

## 2013-09-06 NOTE — Telephone Encounter (Signed)
Message left on triage VM: Please make sure rx for Namenda is corrected to daily vs every other day. Patient will need refill sent to the pharmacy   RX was corrected and called in. Patient aware

## 2013-09-16 ENCOUNTER — Other Ambulatory Visit: Payer: Self-pay | Admitting: Internal Medicine

## 2013-09-30 ENCOUNTER — Other Ambulatory Visit: Payer: Self-pay | Admitting: Internal Medicine

## 2013-10-26 ENCOUNTER — Other Ambulatory Visit: Payer: Self-pay | Admitting: Internal Medicine

## 2013-10-28 ENCOUNTER — Other Ambulatory Visit: Payer: Self-pay | Admitting: *Deleted

## 2013-10-28 MED ORDER — ZOLPIDEM TARTRATE 10 MG PO TABS: ORAL_TABLET | ORAL | Status: DC

## 2013-11-02 ENCOUNTER — Encounter: Payer: Self-pay | Admitting: Internal Medicine

## 2013-11-02 ENCOUNTER — Ambulatory Visit (INDEPENDENT_AMBULATORY_CARE_PROVIDER_SITE_OTHER): Payer: Medicare Other | Admitting: Internal Medicine

## 2013-11-02 VITALS — BP 130/72 | HR 66 | Temp 97.7°F | Wt 178.0 lb

## 2013-11-02 DIAGNOSIS — H6123 Impacted cerumen, bilateral: Secondary | ICD-10-CM

## 2013-11-02 DIAGNOSIS — R0609 Other forms of dyspnea: Secondary | ICD-10-CM

## 2013-11-02 DIAGNOSIS — F411 Generalized anxiety disorder: Secondary | ICD-10-CM

## 2013-11-02 DIAGNOSIS — E782 Mixed hyperlipidemia: Secondary | ICD-10-CM

## 2013-11-02 DIAGNOSIS — I2510 Atherosclerotic heart disease of native coronary artery without angina pectoris: Secondary | ICD-10-CM

## 2013-11-02 DIAGNOSIS — R0789 Other chest pain: Secondary | ICD-10-CM

## 2013-11-02 DIAGNOSIS — G309 Alzheimer's disease, unspecified: Principal | ICD-10-CM

## 2013-11-02 DIAGNOSIS — E039 Hypothyroidism, unspecified: Secondary | ICD-10-CM | POA: Insufficient documentation

## 2013-11-02 DIAGNOSIS — G4700 Insomnia, unspecified: Secondary | ICD-10-CM

## 2013-11-02 DIAGNOSIS — R0600 Dyspnea, unspecified: Secondary | ICD-10-CM

## 2013-11-02 DIAGNOSIS — F0280 Dementia in other diseases classified elsewhere without behavioral disturbance: Secondary | ICD-10-CM

## 2013-11-02 DIAGNOSIS — I208 Other forms of angina pectoris: Secondary | ICD-10-CM

## 2013-11-02 DIAGNOSIS — F5104 Psychophysiologic insomnia: Secondary | ICD-10-CM

## 2013-11-02 DIAGNOSIS — R0989 Other specified symptoms and signs involving the circulatory and respiratory systems: Secondary | ICD-10-CM

## 2013-11-02 DIAGNOSIS — I10 Essential (primary) hypertension: Secondary | ICD-10-CM

## 2013-11-02 DIAGNOSIS — H6120 Impacted cerumen, unspecified ear: Secondary | ICD-10-CM

## 2013-11-02 MED ORDER — LEVOTHYROXINE SODIUM 75 MCG PO TABS: ORAL_TABLET | ORAL | Status: DC

## 2013-11-02 MED ORDER — ALPRAZOLAM 0.5 MG PO TABS: ORAL_TABLET | ORAL | Status: DC

## 2013-11-02 MED ORDER — GALANTAMINE HYDROBROMIDE ER 24 MG PO CP24: ORAL_CAPSULE | ORAL | Status: DC

## 2013-11-02 NOTE — Patient Instructions (Signed)
Continue current medications. 

## 2013-11-02 NOTE — Progress Notes (Signed)
Patient ID: Phillip Larson, male   DOB: Apr 23, 1934, 79 y.o.   MRN: [id number]    Location:    [facility]  Place of Service:  OFFICE    Allergies  Allergen Reactions  . Iodine Swelling    Internal iodine  . Iohexol Hives         Chief Complaint  Patient presents with  . Follow-up    4 month f/u, no recent labs, [employer] [facility] form/MMSE   . other    ears feel full and have pressure on the RT lung when seatbelt is against it or sometimes sleeping    HPI:  ALZHEIMERS DISEASE: unchanged  Essential hypertension, benign: controlled  Mixed hyperlipidemia: Controlled   Unspecified hypothyroidism: Compensated  Anxiety state, unspecified: Mild  Dyspnea: Mild. Patient continues to play tennis without interruption in his activities.  Chronic insomnia: Persistent problem  CAD: No recent angina  Angina decubitus: No recent attacks  Chest discomfort: Musculoskeletal discomfort in the right chest  Impacted cerumen, bilateral: Loss of hearing    Medications: Patient's Medications  New Prescriptions   No medications on file  Previous Medications   ALPRAZOLAM (XANAX) 0.5 MG TABLET    TAKE 1 TABLET BY MOUTH AS NEEDED FOR ANXIETY   ASPIRIN EC 81 MG TABLET    Take 81 mg by mouth at bedtime.   B COMPLEX VITAMINS TABLET    Take 1 tablet by mouth daily.   CHOLECALCIFEROL (VITAMIN D) 1000 UNITS TABLET    Take 1,000 Units by mouth daily.   DILTIAZEM (CARDIZEM CD) 240 MG 24 HR CAPSULE    Take 1 capsule (240 mg total) by mouth daily.   DOCUSATE SODIUM (COLACE) 100 MG CAPSULE    Take 100 mg by mouth daily.    FINASTERIDE (PROSCAR) 5 MG TABLET    Take 1 tablet (5 mg total) by mouth daily.   GALANTAMINE (RAZADYNE ER) 24 MG 24 HR CAPSULE    TAKE 1 CAPSULE DAILY WITH BREAKFAST.   ISOSORBIDE MONONITRATE (IMDUR) 30 MG 24 HR TABLET    Take 15 mg daily cut the 30 mg in half   LEVOTHYROXINE (SYNTHROID, LEVOTHROID) 75 MCG TABLET    Take one tablet by mouth once daily for thyroid   LORATADINE (CLARITIN) 10  MG TABLET    Take 10 mg by mouth daily.   LOVASTATIN (MEVACOR) 40 MG TABLET    Take 1 tablet (40 mg total) by mouth at bedtime.   MEMANTINE HCL ER (NAMENDA XR) 28 MG CP24    Take 1 tablet every day to help preserve memory   MULTIPLE VITAMIN (MULTIVITAMIN WITH MINERALS) TABS TABLET    Take 1 tablet by mouth daily.   NITROGLYCERIN (NITROSTAT) 0.4 MG SL TABLET    One under the tongue if needed for chest tightness. Repeat in 5 minutes if needed. Repeat again in 5 minutes if needed.   OMEGA-3 FATTY ACIDS (FISH OIL) 1200 MG CAPS    Take by mouth.   OMEPRAZOLE (PRILOSEC) 40 MG CAPSULE    Take 40 mg by mouth daily.   PREDNISONE (DELTASONE) 10 MG TABLET       SPECIALTY VITAMINS PRODUCTS (MAGNESIUM, AMINO ACID CHELATE,) 133 MG TABLET    Take 1 tablet by mouth 1 day or 1 dose.   TAMSULOSIN (FLOMAX) 0.4 MG CAPS CAPSULE    TAKE ONE CAPSULE BY MOUTH EVERY DAY TO HELP RELIEVE OBSTRUCTION   TRAZODONE (DESYREL) 100 MG TABLET    Take one tablet by mouth once at  bedtime to help rest   ZINC GLUCONATE 50 MG TABLET    Take 50 mg by mouth at bedtime.   ZOLPIDEM (AMBIEN) 10 MG TABLET    Take one tablet by mouth at bedtime as needed for sleep  Modified Medications   No medications on file  Discontinued Medications   LOVASTATIN (MEVACOR) 40 MG TABLET    TAKE 1 TABLET (40 MG TOTAL) BY MOUTH AT BEDTIME.   TAMSULOSIN (FLOMAX) 0.4 MG CAPS CAPSULE    Take 0.4 mg by mouth daily.     Review of Systems  Constitutional: Negative.   HENT: Positive for hearing loss. Negative for ear pain.   Eyes: Negative.   Respiratory: Negative.  Negative for cough, choking, chest tightness and wheezing.   Cardiovascular: Negative.  Negative for palpitations and leg swelling. Chest pain: right side loer chest discomfort.  Gastrointestinal: Negative.   Endocrine: Negative.   Genitourinary: Positive for frequency.       Incontinent of urine at night Sometimes loses control at night. Hx BPH.  Using tamsulosin and finasteride.    Musculoskeletal: Positive for arthralgias and back pain.  Skin: Negative.   Allergic/Immunologic: Negative.   Neurological: Negative for tremors, syncope, weakness and headaches.       Dementia  Hematological: Negative.   Psychiatric/Behavioral: Negative.     Filed Vitals:   11/02/13 1618  BP: 130/72  Pulse: 66  Temp: 97.7 F (36.5 C)  TempSrc: Oral  Weight: 178 lb (80.74 kg)  SpO2: 98%   Body mass index is 24.84 kg/(m^2).  Physical Exam  Constitutional: He appears well-developed and well-nourished. No distress.  HENT:  Head: Normocephalic and atraumatic.  Nose: Nose normal.  Loss of hearing. Bilateral cerumen impactions.  Eyes: Conjunctivae and EOM are normal. Pupils are equal, round, and reactive to light.  Neck: Normal range of motion. Neck supple. No JVD present. No tracheal deviation present. No thyromegaly present.  Cardiovascular: Normal rate, regular rhythm, normal heart sounds and intact distal pulses.  Exam reveals no gallop and no friction rub.   No murmur heard. Pulmonary/Chest: Effort normal and breath sounds normal. No respiratory distress. He has no wheezes. He has no rales. He exhibits no tenderness.  No focal pain on palpation.  Abdominal: Soft. Bowel sounds are normal. He exhibits no distension and no mass. There is no tenderness.  Long-term right upper abdomen lump that seems most consistent with lipoma and that is unlikely to be a ventral hernia, but I cannot fully rule this out.  Musculoskeletal: Normal range of motion. He exhibits no edema and no tenderness.  Lymphadenopathy:    He has no cervical adenopathy.  Neurological: No cranial nerve deficit. Coordination normal.  Skin: Skin is warm and dry. No rash noted. No erythema. No pallor.  Area suspicious for lipoma under the ribs and the right upper abdomen.  Psychiatric: He has a normal mood and affect. His behavior is normal. Thought content normal.  Memory loss. Increased anxiety. Not depressed.      Labs reviewed: No visits with results within 3 Month(s) from this visit. Latest known visit with results is:  Clinical Support on 07/20/2013  Component Date Value Ref Range Status  . FVC-Pre 07/20/2013 4.06   Preliminary  . FVC-%Pred-Pre 07/20/2013 106   Preliminary  . FVC-Post 07/20/2013 3.95   Preliminary  . FVC-%Pred-Post 07/20/2013 103   Preliminary  . FVC-%Change-Post 07/20/2013 -2   Preliminary  . FEV1-Pre 07/20/2013 3.07   Preliminary  . FEV1-%Pred-Pre 07/20/2013 111  Preliminary  . FEV1-Post 07/20/2013 3.18   Preliminary  . FEV1-%Pred-Post 07/20/2013 115   Preliminary  . FEV1-%Change-Post 07/20/2013 3   Preliminary  . FEV6-Pre 07/20/2013 4.01   Preliminary  . FEV6-%Pred-Pre 07/20/2013 111   Preliminary  . FEV6-Post 07/20/2013 3.89   Preliminary  . FEV6-%Pred-Post 07/20/2013 108   Preliminary  . FEV6-%Change-Post 07/20/2013 -2   Preliminary  . Pre FEV1/FVC ratio 07/20/2013 75   Preliminary  . FEV1FVC-%Pred-Pre 07/20/2013 104   Preliminary  . Post FEV1/FVC ratio 07/20/2013 81   Preliminary  . FEV1FVC-%Change-Post 07/20/2013 6   Preliminary  . Pre FEV6/FVC Ratio 07/20/2013 99   Preliminary  . FEV6FVC-%Pred-Pre 07/20/2013 106   Preliminary  . Post FEV6/FVC ratio 07/20/2013 98   Preliminary  . FEV6FVC-%Pred-Post 07/20/2013 105   Preliminary  . FEV6FVC-%Change-Post 07/20/2013 0   Preliminary  . FEF 25-75 Pre 07/20/2013 2.31   Preliminary  . FEF2575-%Pred-Pre 07/20/2013 118   Preliminary  . FEF 25-75 Post 07/20/2013 2.31   Preliminary  . FEF2575-%Pred-Post 07/20/2013 118   Preliminary  . FEF2575-%Change-Post 07/20/2013 0   Preliminary  . RV 07/20/2013 2.61   Preliminary  . RV % pred 07/20/2013 105   Preliminary  . TLC 07/20/2013 6.96   Preliminary  . TLC % pred 07/20/2013 104   Preliminary  . DLCO unc 07/20/2013 22.68   Preliminary  . DLCO unc % pred 07/20/2013 76   Preliminary  . DL/VA 07/20/2013 3.65   Preliminary  . DL/VA % pred 07/20/2013 81   Preliminary       Assessment/Plan  1. ALZHEIMERS DISEASE Continue current medications - galantamine (RAZADYNE ER) 24 MG 24 hr capsule; TAKE 1 CAPSULE DAILY WITH BREAKFAST.  Dispense: 30 capsule; Refill: 1  2. Essential hypertension, benign Controlled  3. Mixed hyperlipidemia Followup next visit  Followup visit - levothyroxine (SYNTHROID, LEVOTHROID) 75 MCG tablet; Take one tablet by mouth once daily for thyroid  Dispense: 90 tablet; Refill: 3  5. Anxiety state, unspecified Stable - ALPRAZolam (XANAX) 0.5 MG tablet; TAKE 1 TABLET BY MOUTH AS NEEDED FOR ANXIETY  Dispense: 30 tablet; Refill: 0  6. Dyspnea Pulmonary function test within normal limits  7. Chronic insomnia Continue current medications  8. Coronary artery disease :Stable  9. Angina decubitus No recent attacks  10. Chest discomfort Mild musculoskeletal discomfort right chest  11. Impacted cerumen, bilateral Lavage during office visit

## 2013-11-18 ENCOUNTER — Other Ambulatory Visit: Payer: Self-pay | Admitting: Internal Medicine

## 2013-11-24 LAB — PULMONARY FUNCTION TEST
DL/VA % pred: 81 %
DL/VA: 3.65 ml/min/mmHg/L
DLCO unc % pred: 76 %
DLCO unc: 22.68 ml/min/mmHg
FEF 25-75 Post: 2.31 L/sec
FEF 25-75 Pre: 2.31 L/sec
FEF2575-%CHANGE-POST: 0 %
FEF2575-%Pred-Post: 118 %
FEF2575-%Pred-Pre: 118 %
FEV1-%CHANGE-POST: 3 %
FEV1-%PRED-POST: 115 %
FEV1-%Pred-Pre: 111 %
FEV1-PRE: 3.07 L
FEV1-Post: 3.18 L
FEV1FVC-%Change-Post: 6 %
FEV1FVC-%PRED-PRE: 104 %
FEV6-%Change-Post: -2 %
FEV6-%PRED-POST: 108 %
FEV6-%Pred-Pre: 111 %
FEV6-PRE: 4.01 L
FEV6-Post: 3.89 L
FEV6FVC-%Change-Post: 0 %
FEV6FVC-%PRED-PRE: 106 %
FEV6FVC-%Pred-Post: 105 %
FVC-%Change-Post: -2 %
FVC-%Pred-Post: 103 %
FVC-%Pred-Pre: 106 %
FVC-POST: 3.95 L
FVC-Pre: 4.06 L
POST FEV1/FVC RATIO: 81 %
POST FEV6/FVC RATIO: 98 %
PRE FEV6/FVC RATIO: 99 %
Pre FEV1/FVC ratio: 75 %
RV % pred: 105 %
RV: 2.61 L
TLC % pred: 104 %
TLC: 6.96 L

## 2013-11-25 ENCOUNTER — Ambulatory Visit (INDEPENDENT_AMBULATORY_CARE_PROVIDER_SITE_OTHER): Payer: Medicare Other | Admitting: Nurse Practitioner

## 2013-11-25 ENCOUNTER — Encounter: Payer: Self-pay | Admitting: Nurse Practitioner

## 2013-11-25 VITALS — BP 128/80 | HR 70 | Temp 98.0°F | Ht 69.0 in | Wt 178.0 lb

## 2013-11-25 DIAGNOSIS — F411 Generalized anxiety disorder: Secondary | ICD-10-CM

## 2013-11-25 DIAGNOSIS — R5381 Other malaise: Secondary | ICD-10-CM

## 2013-11-25 DIAGNOSIS — R5383 Other fatigue: Secondary | ICD-10-CM

## 2013-11-25 MED ORDER — ALPRAZOLAM 0.5 MG PO TABS: ORAL_TABLET | ORAL | Status: DC

## 2013-11-25 NOTE — Patient Instructions (Signed)
Will get blood work today Make sure to keep yourself properly hydrated

## 2013-11-25 NOTE — Progress Notes (Signed)
Patient ID: Phillip Larson, male   DOB: 1934/09/10, 79 y.o.   MRN: [id number]    Allergies  Allergen Reactions  . Iodine Swelling    Internal iodine  . Iohexol Hives         Chief Complaint  Patient presents with  . Weakness    Fatigue,weakness, and breathing concerns     HPI: Patient is a 79 y.o. male seen in the office today for "feeling worn out" Breathing is fine, blood pressure good. Bone tired, no energy. Hard to hold head up. Been going on for 4 or 5 days, maybe longer No cough or congestion Played tennis Monday and Tuesday wife feels like it is due to dehydration because he does not drink water when he is playing, it has been very hot No changes in sleep pattern  Review of Systems:  Review of Systems  Constitutional: Positive for malaise/fatigue. Negative for fever, chills and weight loss.  HENT: Negative for hearing loss.   Eyes: Negative for blurred vision.  Respiratory: Negative for cough and shortness of breath.   Cardiovascular: Negative for chest pain and leg swelling.  Gastrointestinal: Positive for nausea (almost daily). Negative for heartburn, vomiting, abdominal pain, diarrhea and constipation.  Genitourinary: Negative for dysuria, urgency and frequency.  Skin: Negative for itching and rash.  Neurological: Positive for weakness. Negative for dizziness, tingling and headaches.  Psychiatric/Behavioral: Positive for memory loss. Negative for depression. The patient is nervous/anxious.      Past Medical History  Diagnosis Date  . Mixed hyperlipidemia   . ALZHEIMERS DISEASE   . Essential hypertension, benign   . CAD   . BENIGN PROSTATIC HYPERTROPHY, HX OF   . Impacted cerumen   . Anxiety state, unspecified   . Pressure ulcer   . Actinic keratosis   . Enthesopathy of hip region   . Unspecified hereditary and idiopathic peripheral neuropathy   . Benign neoplasm of colon   . Unspecified vitamin D deficiency   . Other premature beats   . Diverticulosis  of colon (without mention of hemorrhage)   . Unspecified hypothyroidism   . Coronary atherosclerosis of native coronary artery   . Urinary obstruction, unspecified   . Actinic keratosis   . Pain in joint, site unspecified   . Osteoarthrosis, unspecified whether generalized or localized, unspecified site   . Peptic ulcer, unspecified site, unspecified as acute or chronic, without mention of hemorrhage, perforation, or obstruction   . Other and unspecified hyperlipidemia   . Alzheimer's disease   . Myalgia and myositis, unspecified   . Insomnia, unspecified   . Other malaise and fatigue   . External hemorrhoids without mention of complication   . Anal fissure   . Other psoriasis   . Unspecified essential hypertension   . Depressive disorder, not elsewhere classified   . Cerebrovascular disease, unspecified   . Acute, but ill-defined, cerebrovascular disease   . Reflux esophagitis   . Pain in joint, lower leg   . Palpitations   . Shortness of breath   . Coronary atherosclerosis of native coronary artery   . Impacted cerumen [id number]  . Pressure ulcer of buttock, unstageable   . Unspecified hereditary and idiopathic peripheral neuropathy   . Urinary obstruction, unspecified   . Actinic keratosis   . Osteoarthrosis, unspecified whether generalized or localized, unspecified site   . Peptic ulcer, unspecified site, unspecified as acute or chronic, without mention of hemorrhage, perforation, or obstruction   . Myalgia and myositis, unspecified   .  Insomnia, unspecified   . Other malaise and fatigue   . External hemorrhoids without mention of complication   . Anal fissure   . Depressive disorder, not elsewhere classified   . Cerebrovascular disease, unspecified   . Acute, but ill-defined, cerebrovascular disease   . Palpitations    Past Surgical History  Procedure Laterality Date  . Knee surgery  07/18/2006    Dr French Ana   . Cervical disc surgery    . Laminectomy c3 disc  1972    . Retinal tear repair cryotherapy      Dr Wayne Sever   . Multiple tooth extractions  2014   Social History:   reports that he quit smoking about 21 years ago. His smoking use included Cigarettes. He has a 50 pack-year smoking history. He has never used smokeless tobacco. He reports that he drinks alcohol. He reports that he does not use illicit drugs.  Family History  Problem Relation Age of Onset  . Cancer Father     pancreatic  . Cancer Mother     pancreatic    Medications: Patient's Medications  New Prescriptions   No medications on file  Previous Medications   ALPRAZOLAM (XANAX) 0.5 MG TABLET    TAKE 1 TABLET BY MOUTH AS NEEDED FOR ANXIETY   ASPIRIN EC 81 MG TABLET    Take 81 mg by mouth at bedtime.   B COMPLEX VITAMINS TABLET    Take 1 tablet by mouth daily.   CHOLECALCIFEROL (VITAMIN D) 1000 UNITS TABLET    Take 1,000 Units by mouth daily.   DILTIAZEM (CARDIZEM CD) 240 MG 24 HR CAPSULE    Take 1 capsule (240 mg total) by mouth daily.   DOCUSATE SODIUM (COLACE) 100 MG CAPSULE    Take 100 mg by mouth daily.    FINASTERIDE (PROSCAR) 5 MG TABLET    Take 1 tablet (5 mg total) by mouth daily.   GALANTAMINE (RAZADYNE ER) 24 MG 24 HR CAPSULE    TAKE 1 CAPSULE DAILY WITH BREAKFAST.   ISOSORBIDE MONONITRATE (IMDUR) 30 MG 24 HR TABLET    Take 15 mg daily cut the 30 mg in half   LEVOTHYROXINE (SYNTHROID, LEVOTHROID) 75 MCG TABLET    Take one tablet by mouth once daily for thyroid   LORATADINE (CLARITIN) 10 MG TABLET    Take 10 mg by mouth daily.   LOVASTATIN (MEVACOR) 40 MG TABLET    Take 1 tablet (40 mg total) by mouth at bedtime.   MEMANTINE HCL ER (NAMENDA XR) 28 MG CP24    Take 1 tablet every day to help preserve memory   MULTIPLE VITAMIN (MULTIVITAMIN WITH MINERALS) TABS TABLET    Take 1 tablet by mouth daily.   NITROGLYCERIN (NITROSTAT) 0.4 MG SL TABLET    One under the tongue if needed for chest tightness. Repeat in 5 minutes if needed. Repeat again in 5 minutes if needed.    OMEPRAZOLE (PRILOSEC) 40 MG CAPSULE    Take 40 mg by mouth daily.   TAMSULOSIN (FLOMAX) 0.4 MG CAPS CAPSULE    TAKE ONE CAPSULE BY MOUTH EVERY DAY TO HELP RELIEVE OBSTRUCTION   TRAZODONE (DESYREL) 100 MG TABLET    Take one tablet by mouth once at bedtime to help rest   ZINC GLUCONATE 50 MG TABLET    Take 50 mg by mouth at bedtime.   ZOLPIDEM (AMBIEN) 10 MG TABLET    Take one tablet by mouth at bedtime as needed for sleep  Modified Medications  No medications on file  Discontinued Medications   OMEGA-3 FATTY ACIDS (FISH OIL) 1200 MG CAPS    Take by mouth.   PREDNISONE (DELTASONE) 10 MG TABLET       SPECIALTY VITAMINS PRODUCTS (MAGNESIUM, AMINO ACID CHELATE,) 133 MG TABLET    Take 1 tablet by mouth 1 day or 1 dose.     Physical Exam:  Filed Vitals:   11/25/13 1528  BP: 128/80  Pulse: 70  Temp: 98 F (36.7 C)  TempSrc: Oral  Height: 5\' 9"  (1.753 m)  Weight: 178 lb (80.74 kg)  SpO2: 97%    Physical Exam  Vitals reviewed. Constitutional: He is oriented to person, place, and time and well-developed, well-nourished, and in no distress. No distress.  HENT:  Head: Normocephalic and atraumatic.  Right Ear: External ear normal.  Left Ear: External ear normal.  Nose: Nose normal.  Mouth/Throat: Oropharynx is clear and moist. No oropharyngeal exudate.  Eyes: Conjunctivae and EOM are normal. Pupils are equal, round, and reactive to light.  Neck: Normal range of motion. Neck supple.  Cardiovascular: Normal rate, regular rhythm and normal heart sounds.   Pulmonary/Chest: Effort normal and breath sounds normal. No respiratory distress. He has no wheezes. He has no rales. He exhibits no tenderness.  Abdominal: Soft. Bowel sounds are normal. He exhibits no distension. There is no tenderness.  Musculoskeletal: He exhibits no edema and no tenderness.  Lymphadenopathy:    He has no cervical adenopathy.  Neurological: He is alert and oriented to person, place, and time.  Skin: Skin is warm  and dry. He is not diaphoretic.  Psychiatric: Affect normal.    Labs reviewed: Basic Metabolic Panel:  Recent Labs  01/16/13 2124 01/16/13 2205 01/22/13 1218 06/16/13 1243  NA 137  --  141 136  K 4.0  --  4.6 4.7  CL 99  --  102 101  CO2 26  --  30 29  GLUCOSE 107*  --  107* 106*  BUN 13  --  17 14  CREATININE 1.25  --  1.5 1.2  CALCIUM 9.8 10.2 10.7* 9.4   Liver Function Tests:  Recent Labs  06/16/13 1243  AST 22  ALT 21  ALKPHOS 111  BILITOT 0.9  PROT 6.9  ALBUMIN 3.9   No results found for this basename: LIPASE, AMYLASE,  in the last 8760 hours No results found for this basename: AMMONIA,  in the last 8760 hours CBC:  Recent Labs  01/16/13 2124 01/22/13 1218 06/16/13 1243  WBC 6.9 8.2 6.2  NEUTROABS  --  5.7 4.7  HGB 14.1 15.4 15.2  HCT 39.3 45.1 45.6  MCV 89.5 92.5 93.1  PLT 200 225.0 229.0   Lipid Panel: No results found for this basename: CHOL, HDL, LDLCALC, TRIG, CHOLHDL, LDLDIRECT,  in the last 8760 hours TSH: No results found for this basename: TSH,  in the last 8760 hours A1C: No results found for this basename: HGBA1C     Assessment/Plan 1. Anxiety state, unspecified - refill provided ALPRAZolam (XANAX) 0.5 MG tablet; TAKE 1 TABLET BY MOUTH AS NEEDED FOR ANXIETY  Dispense: 30 tablet; Refill: 0   2. Other malaise and fatigue -encouraged rest, proper food and water intake, will get labs and urine to rule out infection/anemia/thyroid  - Urinalysis - Culture, Urine - CBC With differential/Platelet - Comprehensive metabolic panel - TSH

## 2013-11-26 ENCOUNTER — Telehealth: Payer: Self-pay | Admitting: *Deleted

## 2013-11-26 LAB — COMPREHENSIVE METABOLIC PANEL
ALT: 29 IU/L (ref 0–44)
AST: 29 IU/L (ref 0–40)
Albumin/Globulin Ratio: 2.4 (ref 1.1–2.5)
Albumin: 4.7 g/dL (ref 3.5–4.8)
Alkaline Phosphatase: 104 IU/L (ref 39–117)
BILIRUBIN TOTAL: 0.4 mg/dL (ref 0.0–1.2)
BUN/Creatinine Ratio: 10 (ref 10–22)
BUN: 14 mg/dL (ref 8–27)
CO2: 26 mmol/L (ref 18–29)
Calcium: 10 mg/dL (ref 8.6–10.2)
Chloride: 99 mmol/L (ref 97–108)
Creatinine, Ser: 1.42 mg/dL — ABNORMAL HIGH (ref 0.76–1.27)
GFR, EST AFRICAN AMERICAN: 55 mL/min/{1.73_m2} — AB (ref >59)
GFR, EST NON AFRICAN AMERICAN: 47 mL/min/{1.73_m2} — AB (ref >59)
GLOBULIN, TOTAL: 2 g/dL (ref 1.5–4.5)
Glucose: 102 mg/dL — ABNORMAL HIGH (ref 65–99)
POTASSIUM: 4.4 mmol/L (ref 3.5–5.2)
Sodium: 141 mmol/L (ref 134–144)
Total Protein: 6.7 g/dL (ref 6.0–8.5)

## 2013-11-26 LAB — CBC WITH DIFFERENTIAL
Basophils Absolute: 0 10*3/uL (ref 0.0–0.2)
Basos: 0 %
EOS: 6 %
Eosinophils Absolute: 0.4 10*3/uL (ref 0.0–0.4)
HCT: 42.6 % (ref 37.5–51.0)
Hemoglobin: 15 g/dL (ref 12.6–17.7)
IMMATURE GRANS (ABS): 0 10*3/uL (ref 0.0–0.1)
IMMATURE GRANULOCYTES: 0 %
LYMPHS: 20 %
Lymphocytes Absolute: 1.3 10*3/uL (ref 0.7–3.1)
MCH: 32.8 pg (ref 26.6–33.0)
MCHC: 35.2 g/dL (ref 31.5–35.7)
MCV: 93 fL (ref 79–97)
MONOCYTES: 7 %
Monocytes Absolute: 0.4 10*3/uL (ref 0.1–0.9)
Neutrophils Absolute: 4.2 10*3/uL (ref 1.4–7.0)
Neutrophils Relative %: 67 %
PLATELETS: 202 10*3/uL (ref 150–379)
RBC: 4.58 x10E6/uL (ref 4.14–5.80)
RDW: 13.7 % (ref 12.3–15.4)
WBC: 6.3 10*3/uL (ref 3.4–10.8)

## 2013-11-26 LAB — TSH: TSH: 2.63 u[IU]/mL (ref 0.450–4.500)

## 2013-11-26 LAB — URINALYSIS
Bilirubin, UA: NEGATIVE
Glucose, UA: NEGATIVE
KETONES UA: NEGATIVE
LEUKOCYTES UA: NEGATIVE
NITRITE UA: NEGATIVE
Protein, UA: NEGATIVE
RBC, UA: NEGATIVE
Specific Gravity, UA: 1.026 (ref 1.005–1.030)
Urobilinogen, Ur: 0.2 mg/dL (ref 0.0–1.9)
pH, UA: 5.5 (ref 5.0–7.5)

## 2013-11-26 NOTE — Telephone Encounter (Signed)
Message copied by Eilene Ghazi on Fri Nov 26, 2013  3:03 PM ------      Message from: Pricilla Larsson      Created: Fri Nov 26, 2013 10:54 AM       Lab work looks good except for rise in kidney function which indicates he needs to drink more water (dehydrated) at this time AND to avoid NSAIDS (advil, ibuprofen, etc) ------

## 2013-11-26 NOTE — Telephone Encounter (Signed)
Spoke with patient's wife, informed her of his lab results and she stated that she would give him the message. She also stated that she would have him call the office if he has any questions.

## 2013-11-27 LAB — URINE CULTURE

## 2013-11-29 ENCOUNTER — Other Ambulatory Visit: Payer: Self-pay | Admitting: Internal Medicine

## 2013-12-17 ENCOUNTER — Encounter (INDEPENDENT_AMBULATORY_CARE_PROVIDER_SITE_OTHER): Payer: Self-pay

## 2013-12-17 ENCOUNTER — Encounter: Payer: Self-pay | Admitting: Pulmonary Disease

## 2013-12-17 ENCOUNTER — Ambulatory Visit (INDEPENDENT_AMBULATORY_CARE_PROVIDER_SITE_OTHER): Payer: Medicare Other | Admitting: Pulmonary Disease

## 2013-12-17 VITALS — BP 112/70 | HR 58 | Ht 71.0 in | Wt 181.0 lb

## 2013-12-17 DIAGNOSIS — R0789 Other chest pain: Secondary | ICD-10-CM

## 2013-12-17 DIAGNOSIS — R911 Solitary pulmonary nodule: Secondary | ICD-10-CM

## 2013-12-17 NOTE — Progress Notes (Signed)
Chief Complaint  Patient presents with  . Follow-up    Pt states that he is still having chest discomfort--no improvement. Worse when trying to lay flat. Has to sleep on left side, pt report he is having to sleep on the cough at night so that he may get some rest. Pt states that the course of Prednisone he took earlier this year helped a lot with this pain.    History of Present Illness: Phillip Larson is a 79 y.o. male former smoker with right sided chest pain and dyspnea.  He continues to have discomfort in his right chest.  He is very active and plays tennis three times per week.  He does not get short of breath.  He denies cough, sputum, fever, or wheeze.  He has to sleep on his left side to avoid aggravating his pain.  He felt much better when he was taking prednisone.  His pain overall has improved since he first started seeing me, but is persistent.  TESTS: CT chest3/31/08 >> no PE, coronary calcifications, subsegmental atelectasis  Lt and Rt heart cath 08/07/06 >> stable CAD, RA 4, RV 35/4, PA 31/10, PCWP 5, LVEDP 135/11, CI 3.3  Echo 12/09/11 >> mild LVH, EF 55 to 82%, grade 2 diastolic dysfunction, mild LA/RA dilation, PAS 32 mmHg  Spirometry 03/13/12 >> FEV1 2.97 (86%), FEV1% 78  Echo 12/03/12 >> mild LVH, EF 60 to 80%, grade 1 diastolic dysfx, mild AS, mild LA dilation CT chest 06/23/13 >> Mild scattered peribronchovascular ground-glass airspace disease in the right lower lobe PFT 07/20/13 >> FEV1 3.18 (115%), FEV1% 81, TLC 6.96 (104%), DLCO 76%  PMHx, PSHx, Medications, Allergies, Fhx, Shx reviewed.   Physical Exam:  General - No distress ENT - No sinus tenderness, no oral exudate, no LAN Cardiac - s1s2 regular, no murmur Chest - No wheeze/rales/dullness Back - No focal tenderness Abd - Soft, non-tender Ext - No edema Neuro - Normal strength Skin - No rashes Psych - normal mood, and behavior   Assessment/Plan:  Chesley Mires, MD [facility] Pager:  [phone]

## 2013-12-17 NOTE — Patient Instructions (Signed)
Will schedule CT chest and call with results Follow up in 4 months

## 2013-12-21 NOTE — Assessment & Plan Note (Signed)
He had areas of GGO infiltrates on previous CT chest that correlated to areas of his pain symptoms.  He had previous improvement after treatment with prednisone >> symptom recurred after prednisone stopped.  Will repeat CT chest w/o contrast to further assess and call him with results.

## 2013-12-24 ENCOUNTER — Ambulatory Visit (INDEPENDENT_AMBULATORY_CARE_PROVIDER_SITE_OTHER)
Admission: RE | Admit: 2013-12-24 | Discharge: 2013-12-24 | Disposition: A | Discharge status: Home / Self Care | Payer: Medicare Other | Source: Ambulatory Visit | Attending: Pulmonary Disease | Admitting: Pulmonary Disease

## 2013-12-24 DIAGNOSIS — R911 Solitary pulmonary nodule: Secondary | ICD-10-CM

## 2013-12-28 ENCOUNTER — Other Ambulatory Visit: Payer: Self-pay | Admitting: Internal Medicine

## 2013-12-31 ENCOUNTER — Other Ambulatory Visit: Payer: Self-pay | Admitting: Nurse Practitioner

## 2014-01-04 ENCOUNTER — Telehealth: Payer: Self-pay | Admitting: Pulmonary Disease

## 2014-01-04 ENCOUNTER — Other Ambulatory Visit: Payer: Self-pay | Admitting: Internal Medicine

## 2014-01-04 LAB — HM COLONOSCOPY

## 2014-01-04 NOTE — Telephone Encounter (Signed)
CT chest 12/24/13 >> mild GGO lower lobes, gallstones  Explained that CT shows mild scarring.  He has gallstones >> no symptoms to suggest gallbladder disease.  Will continue clinical monitoring.

## 2014-01-19 ENCOUNTER — Encounter: Payer: Self-pay | Admitting: *Deleted

## 2014-01-26 ENCOUNTER — Other Ambulatory Visit: Payer: Self-pay | Admitting: Internal Medicine

## 2014-02-07 HISTORY — PX: EYE SURGERY: SHX253

## 2014-02-07 HISTORY — PX: CATARACT EXTRACTION: SUR2

## 2014-02-15 ENCOUNTER — Ambulatory Visit (INDEPENDENT_AMBULATORY_CARE_PROVIDER_SITE_OTHER): Payer: Medicare Other | Admitting: Internal Medicine

## 2014-02-15 ENCOUNTER — Encounter: Payer: Self-pay | Admitting: Internal Medicine

## 2014-02-15 VITALS — BP 118/62 | HR 64 | Temp 97.5°F | Wt 181.2 lb

## 2014-02-15 DIAGNOSIS — R002 Palpitations: Secondary | ICD-10-CM

## 2014-02-15 MED ORDER — ALPRAZOLAM 0.5 MG PO TABS: 0.5000 mg | ORAL_TABLET | Freq: Two times a day (BID) | ORAL | Status: DC | PRN

## 2014-02-15 NOTE — Progress Notes (Signed)
Patient ID: Phillip Larson, male   DOB: 03/22/1935, 79 y.o.   MRN: [id number]    Chief Complaint  Patient presents with  . Acute Visit     complains of cant breath and this happens when he is at the [employer] working, Heart races and tightness is chest. Patient sees a Film/video editor and Pulmonologist Regular   Allergies  Allergen Reactions  . Iodine Swelling    Internal iodine  . Iohexol Hives        HPI 79 y/o male patient is seen for acute concern. He has noticed himself to have attacks where he feels helpless. This has happened twice in brief interval period for him. Both in similar environment while working in [employer]. 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 He then has to step outside the building , go to open space and felt better. Denies any chest pain during this episode. He does feel lightheaded during this episode.  ROS Denies any chest pain Denies palpitations at present Denies dyspnea or wheezing No fever or chills No nausea or vomiting or abdominal pain Denies depression Denies new stress  Past Medical History  Diagnosis Date  . Mixed hyperlipidemia   . ALZHEIMERS DISEASE   . Essential hypertension, benign   . CAD   . BENIGN PROSTATIC HYPERTROPHY, HX OF   . Impacted cerumen   . Anxiety state, unspecified   . Pressure ulcer   . Actinic keratosis   . Enthesopathy of hip region   . Unspecified hereditary and idiopathic peripheral neuropathy   . Benign neoplasm of colon   . Unspecified vitamin D deficiency   . Other premature beats   . Diverticulosis of colon (without mention of hemorrhage)   . Unspecified hypothyroidism   . Coronary atherosclerosis of native coronary artery   . Urinary obstruction, unspecified   . Actinic keratosis   . Pain in joint, site unspecified   . Osteoarthrosis, unspecified whether generalized or localized, unspecified site    . Peptic ulcer, unspecified site, unspecified as acute or chronic, without mention of hemorrhage, perforation, or obstruction   . Other and unspecified hyperlipidemia   . Alzheimer's disease   . Myalgia and myositis, unspecified   . Insomnia, unspecified   . Other malaise and fatigue   . External hemorrhoids without mention of complication   . Anal fissure   . Other psoriasis   . Unspecified essential hypertension   . Depressive disorder, not elsewhere classified   . Cerebrovascular disease, unspecified   . Acute, but ill-defined, cerebrovascular disease   . Reflux esophagitis   . Pain in joint, lower leg   . Palpitations   . Shortness of breath   . Coronary atherosclerosis of native coronary artery   . Impacted cerumen [id number]  . Pressure ulcer of buttock, unstageable   . Unspecified hereditary and idiopathic peripheral neuropathy   . Urinary obstruction, unspecified   . Actinic keratosis   . Osteoarthrosis, unspecified whether generalized or localized, unspecified site   . Peptic ulcer, unspecified site, unspecified as acute or chronic, without mention of hemorrhage, perforation, or obstruction   . Myalgia and myositis, unspecified   . Insomnia, unspecified   . Other malaise and fatigue   . External hemorrhoids without mention of complication   . Anal fissure   . Depressive disorder, not elsewhere classified   . Cerebrovascular disease, unspecified   . Acute, but ill-defined,  cerebrovascular disease   . Palpitations    Medication reviewed. See [facility]  Physical exam BP 118/62 mmHg  Pulse 64  Temp(Src) 97.5 F (36.4 C) (Oral)  Wt 181 lb 3.2 oz (82.192 kg)  General- elderly male in no acute distress Head- atraumatic, normocephalic Eyes- PERRLA, EOMI, no pallor, no icterus Neck- no lymphadenopathy, no thyromegaly, no jugular vein distension Chest- no chest wall deformities, no chest wall tenderness Cardiovascular- normal s1,s2, no murmurs/ rubs/ gallops Respiratory-  bilateral clear to auscultation, no wheeze, no rhonchi, no crackles Abdomen- bowel sounds present, soft, non tender Musculoskeletal- able to move all 4 extremities Neurological- no focal deficit Psychiatry- alert and oriented to person, place and time, normal mood and affect  Assessment/plan  1. Palpitations EKG done, normal sinus rhythm, no acute changes in ekg. Check cbc with diff to rule out anemia. Check cmp to assess for lyte abnormality. Get tsh to rule out thyroid abnormalities. This appears to be panic attacks given the nature and similar environment triggering it. Will have him on xanax 0.5 mg bid prn for now. Advised to carry the pill with him when he goes out for emergency case. - EKG 12-Lead - CBC with Differential - TSH - CMP

## 2014-02-16 LAB — COMPREHENSIVE METABOLIC PANEL
ALK PHOS: 116 IU/L (ref 39–117)
ALT: 21 IU/L (ref 0–44)
AST: 19 IU/L (ref 0–40)
Albumin/Globulin Ratio: 2.4 (ref 1.1–2.5)
Albumin: 4.5 g/dL (ref 3.5–4.8)
BUN/Creatinine Ratio: 9 — ABNORMAL LOW (ref 10–22)
BUN: 12 mg/dL (ref 8–27)
CO2: 24 mmol/L (ref 18–29)
CREATININE: 1.4 mg/dL — AB (ref 0.76–1.27)
Calcium: 9.7 mg/dL (ref 8.6–10.2)
Chloride: 100 mmol/L (ref 97–108)
GFR calc Af Amer: 56 mL/min/{1.73_m2} — ABNORMAL LOW (ref >59)
GFR calc non Af Amer: 48 mL/min/{1.73_m2} — ABNORMAL LOW (ref >59)
Globulin, Total: 1.9 g/dL (ref 1.5–4.5)
Glucose: 103 mg/dL — ABNORMAL HIGH (ref 65–99)
POTASSIUM: 4.2 mmol/L (ref 3.5–5.2)
SODIUM: 141 mmol/L (ref 134–144)
Total Bilirubin: 0.6 mg/dL (ref 0.0–1.2)
Total Protein: 6.4 g/dL (ref 6.0–8.5)

## 2014-02-16 LAB — CBC WITH DIFFERENTIAL/PLATELET
BASOS ABS: 0 10*3/uL (ref 0.0–0.2)
BASOS: 1 %
EOS ABS: 0.4 10*3/uL (ref 0.0–0.4)
Eos: 8 %
HCT: 45.1 % (ref 37.5–51.0)
Hemoglobin: 15.1 g/dL (ref 12.6–17.7)
IMMATURE GRANS (ABS): 0 10*3/uL (ref 0.0–0.1)
IMMATURE GRANULOCYTES: 0 %
LYMPHS: 22 %
Lymphocytes Absolute: 1.2 10*3/uL (ref 0.7–3.1)
MCH: 31.1 pg (ref 26.6–33.0)
MCHC: 33.5 g/dL (ref 31.5–35.7)
MCV: 93 fL (ref 79–97)
Monocytes Absolute: 0.4 10*3/uL (ref 0.1–0.9)
Monocytes: 7 %
Neutrophils Absolute: 3.3 10*3/uL (ref 1.4–7.0)
Neutrophils Relative %: 62 %
RBC: 4.86 x10E6/uL (ref 4.14–5.80)
RDW: 12.8 % (ref 12.3–15.4)
WBC: 5.3 10*3/uL (ref 3.4–10.8)

## 2014-02-16 LAB — TSH: TSH: 2.74 u[IU]/mL (ref 0.450–4.500)

## 2014-02-18 ENCOUNTER — Telehealth: Payer: Self-pay | Admitting: *Deleted

## 2014-02-18 NOTE — Telephone Encounter (Signed)
Spoke with patient regarding his lab results, he stated that he did not have any questions at this time.

## 2014-02-18 NOTE — Telephone Encounter (Signed)
Message copied by Eilene Ghazi on Fri Feb 18, 2014  4:32 PM ------      Message from: Blanchie Serve      Created: Fri Feb 18, 2014  3:59 PM       No anemia. Normal thyroid function. Your racing of heart episode was likely from panic attack. ------

## 2014-02-23 ENCOUNTER — Other Ambulatory Visit: Payer: Self-pay | Admitting: Internal Medicine

## 2014-03-08 ENCOUNTER — Encounter: Payer: Self-pay | Admitting: Cardiovascular Disease

## 2014-03-08 ENCOUNTER — Ambulatory Visit (INDEPENDENT_AMBULATORY_CARE_PROVIDER_SITE_OTHER): Payer: Medicare Other | Admitting: Cardiovascular Disease

## 2014-03-08 VITALS — BP 122/62 | HR 63 | Ht 71.0 in | Wt 184.0 lb

## 2014-03-08 DIAGNOSIS — I2510 Atherosclerotic heart disease of native coronary artery without angina pectoris: Secondary | ICD-10-CM

## 2014-03-08 DIAGNOSIS — I10 Essential (primary) hypertension: Secondary | ICD-10-CM

## 2014-03-08 DIAGNOSIS — R001 Bradycardia, unspecified: Secondary | ICD-10-CM

## 2014-03-08 MED ORDER — DILTIAZEM HCL ER COATED BEADS 240 MG PO CP24: 240.0000 mg | ORAL_CAPSULE | Freq: Every day | ORAL | Status: DC

## 2014-03-08 MED ORDER — ISOSORBIDE MONONITRATE ER 30 MG PO TB24: ORAL_TABLET | ORAL | Status: DC

## 2014-03-08 NOTE — Assessment & Plan Note (Signed)
Stable with no angina and good activity level.  Continue medical Rx  

## 2014-03-08 NOTE — Progress Notes (Signed)
Patient ID: Phillip Larson, male   DOB: [birth year], 79 y.o.   MRN: [id number] Phillip Larson is seen today for followup of his blood pressure.. He has been more active. He is watching the salt in his diet. He also has hypercholesterolemia Labs 10/23/11 reviewed Cr 1.22, normal LFTS and LDL 91 HDL 56 TC 202 He has CAD with a total RCA that is collaterized. He had a low risk myovue in 2009 .  SSCP increased earlier this month Cath done 01/28/13 from right radial approach and no changes with chronically occluded RCA , robust collaterals and no significant left sided disease   Playing tennis 3x/week no chest pain needs refills on cardizem and imdur.        ROS: Denies fever, malais, weight loss, blurry vision, decreased visual acuity, cough, sputum, SOB, hemoptysis, pleuritic pain, palpitaitons, heartburn, abdominal pain, melena, lower extremity edema, claudication, or rash.  All other systems reviewed and negative  General: Affect appropriate Healthy:  appears stated age 19: normal Neck supple with no adenopathy JVP normal no bruits no thyromegaly Lungs clear with no wheezing and good diaphragmatic motion Heart:  S1/S2 no murmur, no rub, gallop or click PMI normal Abdomen: benighn, BS positve, no tenderness, no AAA no bruit.  No HSM or HJR Distal pulses intact with no bruits No edema Neuro non-focal Skin warm and dry No muscular weakness   Current Outpatient Prescriptions  Medication Sig Dispense Refill  . ALPRAZolam (XANAX) 0.5 MG tablet Take 1 tablet (0.5 mg total) by mouth 2 (two) times daily as needed for anxiety. 30 tablet 1  . aspirin EC 81 MG tablet Take 81 mg by mouth at bedtime.    [name] b complex vitamins tablet Take 1 tablet by mouth daily.    [name] BESIVANCE 0.6 % SUSP   1  . cholecalciferol (VITAMIN D) 1000 UNITS tablet Take 1,000 Units by mouth daily.    . [facility] GENTLE LAXATIVE 5 MG EC tablet See admin instructions.  0  . diltiazem (CARDIZEM CD) 240 MG 24 hr capsule Take 1 capsule (240  mg total) by mouth daily. 90 capsule 3  . docusate sodium (COLACE) 100 MG capsule Take 100 mg by mouth daily.     . DUREZOL 0.05 % EMUL   1  . finasteride (PROSCAR) 5 MG tablet Take 1 tablet (5 mg total) by mouth daily. 90 tablet 3  . galantamine (RAZADYNE ER) 24 MG 24 hr capsule TAKE ONE CAPSULE EVERY DAY WITH BREAKFAST 30 capsule 5  . isosorbide mononitrate (IMDUR) 30 MG 24 hr tablet 1 EVERY DAY 90 tablet 3  . levothyroxine (SYNTHROID, LEVOTHROID) 75 MCG tablet Take one tablet by mouth once daily for thyroid 90 tablet 3  . loratadine (CLARITIN) 10 MG tablet Take 10 mg by mouth daily.    [name] lovastatin (MEVACOR) 40 MG tablet Take 1 tablet (40 mg total) by mouth at bedtime. 90 tablet 3  . Multiple Vitamin (MULTIVITAMIN WITH MINERALS) TABS tablet Take 1 tablet by mouth daily.    [name] NAMENDA XR 28 MG CP24 TAKE 1 TABLET BY MOUTH EVERY DAY 30 capsule 5  . nitroGLYCERIN (NITROSTAT) 0.4 MG SL tablet One under the tongue if needed for chest tightness. Repeat in 5 minutes if needed. Repeat again in 5 minutes if needed. 25 tablet 12  . omeprazole (PRILOSEC) 40 MG capsule Take 40 mg by mouth daily.    . polyethylene glycol-electrolytes (NULYTELY/GOLYTELY) 420 G solution See admin instructions.  0  . PROLENSA 0.07 %  SOLN   1  . tamsulosin (FLOMAX) 0.4 MG CAPS capsule TAKE ONE CAPSULE BY MOUTH EVERY DAY TO HELP RELIEVE OBSTRUCTION 30 capsule 6  . traZODone (DESYREL) 100 MG tablet Take one tablet by mouth once at bedtime to help rest 90 tablet 3  . zinc gluconate 50 MG tablet Take 50 mg by mouth at bedtime.    [name] zolpidem (AMBIEN) 10 MG tablet TAKE 1 TABLET BY MOUTH AT BEDTIME (INS ONLY PAYS FOR 90 TABS A YEAR -- HAS REACHED MAX) 30 tablet 0   No current facility-administered medications for this visit.    Allergies  Iodine and Iohexol  Electrocardiogram:  SR rate 63  Old IMI  No change from 2014   Assessment and Plan

## 2014-03-08 NOTE — Patient Instructions (Signed)
Your physician wants you to follow-up in:  6 MONTHS WITH DR NISHAN  You will receive a reminder letter in the mail two months in advance. If you don't receive a letter, please call our office to schedule the follow-up appointment. Your physician recommends that you continue on your current medications as directed. Please refer to the Current Medication list given to you today. 

## 2014-03-08 NOTE — Assessment & Plan Note (Signed)
No presyncope ECG ok with no AV block avoid beta blockers despite CAD

## 2014-03-08 NOTE — Assessment & Plan Note (Signed)
Well controlled.  Continue current medications and low sodium Dash type diet.    

## 2014-03-23 ENCOUNTER — Other Ambulatory Visit: Payer: Self-pay | Admitting: *Deleted

## 2014-03-23 MED ORDER — ZOLPIDEM TARTRATE 10 MG PO TABS: ORAL_TABLET | ORAL | Status: DC

## 2014-03-23 NOTE — Telephone Encounter (Signed)
Patient wife requested to be called into [facility]

## 2014-03-31 ENCOUNTER — Encounter (HOSPITAL_COMMUNITY): Payer: Self-pay | Admitting: Cardiovascular Disease

## 2014-04-07 ENCOUNTER — Other Ambulatory Visit: Payer: Self-pay | Admitting: *Deleted

## 2014-04-07 ENCOUNTER — Encounter: Payer: Self-pay | Admitting: Pulmonary Disease

## 2014-04-07 ENCOUNTER — Ambulatory Visit (INDEPENDENT_AMBULATORY_CARE_PROVIDER_SITE_OTHER): Payer: Medicare Other | Admitting: Pulmonary Disease

## 2014-04-07 VITALS — BP 118/88 | HR 79 | Temp 97.7°F | Ht 71.0 in | Wt 189.6 lb

## 2014-04-07 DIAGNOSIS — R911 Solitary pulmonary nodule: Secondary | ICD-10-CM

## 2014-04-07 DIAGNOSIS — R0789 Other chest pain: Secondary | ICD-10-CM

## 2014-04-07 MED ORDER — TAMSULOSIN HCL 0.4 MG PO CAPS: ORAL_CAPSULE | ORAL | Status: DC

## 2014-04-07 NOTE — Progress Notes (Signed)
Chief Complaint  Patient presents with  . Follow-up    No change. Pt reports still having some Right chetst discomfort.     History of Present Illness: Phillip Larson is a 79 y.o. male former smoker with right sided chest pain and dyspnea.  Feeling better, tennis several per week no trouble, discomoft with seat belt and sleep on rt side  TESTS: CT chest3/31/08 >> no PE, coronary calcifications, subsegmental atelectasis  Lt and Rt heart cath 08/07/06 >> stable CAD, RA 4, RV 35/4, PA 31/10, PCWP 5, LVEDP 135/11, CI 3.3  Echo 12/09/11 >> mild LVH, EF 55 to 46%, grade 2 diastolic dysfunction, mild LA/RA dilation, PAS 32 mmHg  Spirometry 03/13/12 >> FEV1 2.97 (86%), FEV1% 78  Echo 12/03/12 >> mild LVH, EF 60 to 65%, grade 1 diastolic dysfx, mild AS, mild LA dilation CT chest 06/23/13 >> Mild scattered peribronchovascular ground-glass airspace disease in the right lower lobe PFT 07/20/13 >> FEV1 3.18 (115%), FEV1% 81, TLC 6.96 (104%), DLCO 76% CT chest 12/24/13 >> mild GGO lower lobes, gallstones  PMHx >> Alzheimer's disease, Depression, HLD, CAD, HTN, BPH, Neuropathy, Hypothyroidism, GERD  PSHx, Medications, Allergies, Fhx, Shx reviewed.   Physical Exam: Blood pressure 118/88, pulse 79, temperature 97.7 F (36.5 C), temperature source Oral, height 5\' 11"  (1.803 m), weight 189 lb 9.6 oz (86.002 kg), SpO2 96 %. Body mass index is 26.46 kg/(m^2).  General - No distress ENT - No sinus tenderness, no oral exudate, no LAN Cardiac - s1s2 regular, no murmur Chest - No wheeze/rales/dullness Back - No focal tenderness Abd - Soft, non-tender Ext - No edema Neuro - Normal strength Skin - No rashes Psych - normal mood, and behavior   Assessment/Plan:  Right sided chest discomfort > improved. Plan: - monitor clinically  GGO Rt lung >> likely scarring. Plan: - no additional f/u needed at this time   Chesley Mires, MD [facility] Pager:  [phone]

## 2014-04-07 NOTE — Patient Instructions (Signed)
Follow up as needed

## 2014-04-18 ENCOUNTER — Other Ambulatory Visit: Payer: Self-pay | Admitting: Internal Medicine

## 2014-05-01 ENCOUNTER — Other Ambulatory Visit: Payer: Self-pay | Admitting: Internal Medicine

## 2014-05-03 ENCOUNTER — Ambulatory Visit: Payer: Self-pay | Admitting: Internal Medicine

## 2014-05-12 ENCOUNTER — Other Ambulatory Visit: Payer: Self-pay | Admitting: *Deleted

## 2014-05-12 DIAGNOSIS — N139 Obstructive and reflux uropathy, unspecified: Secondary | ICD-10-CM

## 2014-05-12 DIAGNOSIS — E782 Mixed hyperlipidemia: Secondary | ICD-10-CM

## 2014-05-12 DIAGNOSIS — E038 Other specified hypothyroidism: Secondary | ICD-10-CM

## 2014-05-12 MED ORDER — LOVASTATIN 40 MG PO TABS: 40.0000 mg | ORAL_TABLET | Freq: Every day | ORAL | Status: DC

## 2014-05-12 MED ORDER — LEVOTHYROXINE SODIUM 75 MCG PO TABS: ORAL_TABLET | ORAL | Status: DC

## 2014-05-12 MED ORDER — FINASTERIDE 5 MG PO TABS: 5.0000 mg | ORAL_TABLET | Freq: Every day | ORAL | Status: DC

## 2014-05-12 MED ORDER — TRAZODONE HCL 100 MG PO TABS: ORAL_TABLET | ORAL | Status: DC

## 2014-05-12 NOTE — Telephone Encounter (Signed)
Patient wife called and requested. Faxed to pharmacy

## 2014-05-17 ENCOUNTER — Encounter: Payer: Self-pay | Admitting: Internal Medicine

## 2014-05-17 ENCOUNTER — Ambulatory Visit (INDEPENDENT_AMBULATORY_CARE_PROVIDER_SITE_OTHER): Payer: Medicare Other | Admitting: Internal Medicine

## 2014-05-17 VITALS — BP 140/72 | HR 67 | Temp 98.3°F | Resp 18 | Ht 71.0 in | Wt 184.0 lb

## 2014-05-17 DIAGNOSIS — E039 Hypothyroidism, unspecified: Secondary | ICD-10-CM | POA: Diagnosis not present

## 2014-05-17 DIAGNOSIS — F5104 Psychophysiologic insomnia: Secondary | ICD-10-CM

## 2014-05-17 DIAGNOSIS — I10 Essential (primary) hypertension: Secondary | ICD-10-CM | POA: Diagnosis not present

## 2014-05-17 DIAGNOSIS — F0280 Dementia in other diseases classified elsewhere without behavioral disturbance: Secondary | ICD-10-CM

## 2014-05-17 DIAGNOSIS — G4700 Insomnia, unspecified: Secondary | ICD-10-CM

## 2014-05-17 DIAGNOSIS — G309 Alzheimer's disease, unspecified: Secondary | ICD-10-CM | POA: Diagnosis not present

## 2014-05-17 DIAGNOSIS — E782 Mixed hyperlipidemia: Secondary | ICD-10-CM | POA: Diagnosis not present

## 2014-05-17 NOTE — Progress Notes (Signed)
Patient ID: Phillip Larson, male   DOB: 01/28/1935, 80 y.o.   MRN: [id number]    Facility  [facility]    Place of Service:   OFFICE   Allergies  Allergen Reactions  . Iodine Swelling    Internal iodine  . Iohexol Hives         Chief Complaint  Patient presents with  . Medical Management of Chronic Issues    HPI:  ALZHEIMERS DISEASE: unchanged. He is accompanied by his wife today and she agrees. 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 Active socially and plays tennis regularly.  Chronic insomnia: continues to relay on medications and wine to help sleep.  Essential hypertension, benign -controlled  Mixed hyperlipidemia - controlled  Hypothyroidism, unspecified hypothyroidism type - needs follow up of TSH  Continues with mild chest discomfort on right lower to mid chest. Dr. Halford Chessman, pulmonary, believes this is a benign issue.    Medications: Patient's Medications  New Prescriptions   No medications on file  Previous Medications   ALPRAZOLAM (XANAX) 0.5 MG TABLET    TAKE ONE TABLET BY MOUTH TWICE DAILY AS NEEDED FOR ANXIETY   ASPIRIN EC 81 MG TABLET    Take 81 mg by mouth at bedtime.   B COMPLEX VITAMINS TABLET    Take 1 tablet by mouth daily.   CHOLECALCIFEROL (VITAMIN D) 1000 UNITS TABLET    Take 1,000 Units by mouth daily.   DILTIAZEM (CARDIZEM CD) 240 MG 24 HR CAPSULE    Take 1 capsule (240 mg total) by mouth daily.   DOCUSATE SODIUM (COLACE) 100 MG CAPSULE    Take 100 mg by mouth daily.    FINASTERIDE (PROSCAR) 5 MG TABLET    Take 1 tablet (5 mg total) by mouth daily. For prostate   GALANTAMINE (RAZADYNE ER) 24 MG 24 HR CAPSULE    TAKE ONE CAPSULE EVERY DAY WITH BREAKFAST   ISOSORBIDE MONONITRATE (IMDUR) 30 MG 24 HR TABLET    1 EVERY DAY   LEVOTHYROXINE (SYNTHROID, LEVOTHROID) 75 MCG TABLET    Take one tablet by mouth once daily for thyroid   LORATADINE (CLARITIN) 10 MG TABLET    Take 10 mg by mouth daily.   LOTEMAX 0.5 % GEL    as needed.   LOVASTATIN  (MEVACOR) 40 MG TABLET    Take 1 tablet (40 mg total) by mouth at bedtime. For cholesterol   MULTIPLE VITAMIN (MULTIVITAMIN WITH MINERALS) TABS TABLET    Take 1 tablet by mouth daily.   NAMENDA XR 28 MG CP24    TAKE 1 TABLET BY MOUTH EVERY DAY   NITROGLYCERIN (NITROSTAT) 0.4 MG SL TABLET    One under the tongue if needed for chest tightness. Repeat in 5 minutes if needed. Repeat again in 5 minutes if needed.   OMEPRAZOLE (PRILOSEC) 40 MG CAPSULE    Take 40 mg by mouth daily.   TAMSULOSIN (FLOMAX) 0.4 MG CAPS CAPSULE    Take one capsule by mouth once daily to help relieve obstruction   TRAZODONE (DESYREL) 100 MG TABLET    Take one tablet by mouth once at bedtime to help rest   ZINC GLUCONATE 50 MG TABLET    Take 50 mg by mouth at bedtime.   ZOLPIDEM (AMBIEN) 10 MG TABLET    Take one tablet by mouth at bedtime for  rest  Modified Medications   No medications on file  Discontinued Medications   BESIVANCE 0.6 % SUSP  CVS GENTLE LAXATIVE 5 MG EC TABLET    See admin instructions.   DUREZOL 0.05 % EMUL       POLYETHYLENE GLYCOL-ELECTROLYTES (NULYTELY/GOLYTELY) 420 G SOLUTION    See admin instructions.   PROLENSA 0.07 % SOLN       TAMSULOSIN (FLOMAX) 0.4 MG CAPS CAPSULE    TAKE ONE CAPSULE BY MOUTH EVERY DAY TO HELP RELIEVE OBSTRUCTION     Review of Systems  Constitutional: Negative.   HENT: Positive for hearing loss. Negative for ear pain.   Eyes: Negative.   Respiratory: Negative.  Negative for cough, choking, chest tightness and wheezing.   Cardiovascular: Negative.  Negative for palpitations and leg swelling. Chest pain: right side loer chest discomfort.  Gastrointestinal: Negative.   Endocrine: Negative.   Genitourinary: Positive for frequency.       Incontinent of urine at night Sometimes loses control at night. Hx BPH.  Using tamsulosin and finasteride.  Musculoskeletal: Positive for back pain and arthralgias.  Skin: Negative.   Allergic/Immunologic: Negative.   Neurological:  Negative for tremors, syncope, weakness and headaches.       Dementia  Hematological: Negative.   Psychiatric/Behavioral: Negative.     Filed Vitals:   05/17/14 1643  BP: 140/72  Pulse: 67  Temp: 98.3 F (36.8 C)  TempSrc: Oral  Resp: 18  Height: 5\' 11"  (1.803 m)  Weight: 184 lb (83.462 kg)  SpO2: 97%   Body mass index is 25.67 kg/(m^2).  Physical Exam  Constitutional: He appears well-developed and well-nourished. No distress.  HENT:  Head: Normocephalic and atraumatic.  Nose: Nose normal.  Loss of hearing. Bilateral cerumen impactions.  Eyes: Conjunctivae and EOM are normal. Pupils are equal, round, and reactive to light.  Neck: Normal range of motion. Neck supple. No JVD present. No tracheal deviation present. No thyromegaly present.  Cardiovascular: Normal rate, regular rhythm, normal heart sounds and intact distal pulses.  Exam reveals no gallop and no friction rub.   No murmur heard. Pulmonary/Chest: Effort normal and breath sounds normal. No respiratory distress. He has no wheezes. He has no rales. He exhibits no tenderness.  No focal pain on palpation.  Abdominal: Soft. Bowel sounds are normal. He exhibits no distension and no mass. There is no tenderness.  Long-term right upper abdomen lump that seems most consistent with lipoma and that is unlikely to be a ventral hernia, but I cannot fully rule this out.  Musculoskeletal: Normal range of motion. He exhibits no edema or tenderness.  Lymphadenopathy:    He has no cervical adenopathy.  Neurological: No cranial nerve deficit. Coordination normal.  Skin: Skin is warm and dry. No rash noted. No erythema. No pallor.  Area suspicious for lipoma under the ribs and the right upper abdomen.  Psychiatric: He has a normal mood and affect. His behavior is normal. Thought content normal.  Memory loss. Increased anxiety. Not depressed.     Labs reviewed: Office Visit on 02/15/2014  Component Date Value Ref Range Status  .  WBC 02/15/2014 5.3  3.4 - 10.8 x10E3/uL Final  . RBC 02/15/2014 4.86  4.14 - 5.80 x10E6/uL Final  . Hemoglobin 02/15/2014 15.1  12.6 - 17.7 g/dL Final  . HCT 02/15/2014 45.1  37.5 - 51.0 % Final  . MCV 02/15/2014 93  79 - 97 fL Final  . MCH 02/15/2014 31.1  26.6 - 33.0 pg Final  . MCHC 02/15/2014 33.5  31.5 - 35.7 g/dL Final  . RDW 02/15/2014 12.8  12.3 - 15.4 %  Final  . Neutrophils Relative % 02/15/2014 62   Final  . Lymphs 02/15/2014 22   Final  . Monocytes 02/15/2014 7   Final  . Eos 02/15/2014 8   Final  . Basos 02/15/2014 1   Final  . Neutrophils Absolute 02/15/2014 3.3  1.4 - 7.0 x10E3/uL Final  . Lymphocytes Absolute 02/15/2014 1.2  0.7 - 3.1 x10E3/uL Final  . Monocytes Absolute 02/15/2014 0.4  0.1 - 0.9 x10E3/uL Final  . Eosinophils Absolute 02/15/2014 0.4  0.0 - 0.4 x10E3/uL Final  . Basophils Absolute 02/15/2014 0.0  0.0 - 0.2 x10E3/uL Final  . Immature Granulocytes 02/15/2014 0   Final  . Immature Grans (Abs) 02/15/2014 0.0  0.0 - 0.1 x10E3/uL Final  . TSH 02/15/2014 2.740  0.450 - 4.500 uIU/mL Final  . Glucose 02/15/2014 103* 65 - 99 mg/dL Final  . BUN 02/15/2014 12  8 - 27 mg/dL Final  . Creatinine, Ser 02/15/2014 1.40* 0.76 - 1.27 mg/dL Final  . GFR calc non Af Amer 02/15/2014 48* >59 mL/min/1.73 Final  . GFR calc Af Amer 02/15/2014 56* >59 mL/min/1.73 Final  . BUN/Creatinine Ratio 02/15/2014 9* 10 - 22 Final  . Sodium 02/15/2014 141  134 - 144 mmol/L Final  . Potassium 02/15/2014 4.2  3.5 - 5.2 mmol/L Final  . Chloride 02/15/2014 100  97 - 108 mmol/L Final  . CO2 02/15/2014 24  18 - 29 mmol/L Final  . Calcium 02/15/2014 9.7  8.6 - 10.2 mg/dL Final  . Total Protein 02/15/2014 6.4  6.0 - 8.5 g/dL Final  . Albumin 02/15/2014 4.5  3.5 - 4.8 g/dL Final  . Globulin, Total 02/15/2014 1.9  1.5 - 4.5 g/dL Final  . Albumin/Globulin Ratio 02/15/2014 2.4  1.1 - 2.5 Final  . Total Bilirubin 02/15/2014 0.6  0.0 - 1.2 mg/dL Final  . Alkaline Phosphatase 02/15/2014 116  39 - 117  IU/L Final  . AST 02/15/2014 19  0 - 40 IU/L Final  . ALT 02/15/2014 21  0 - 44 IU/L Final     Assessment/Plan  1. ALZHEIMERS DISEASE Unchanged. Consider MMSE next visit  2. Chronic insomnia Continue current meds  3. Essential hypertension, benign - Comprehensive metabolic panel; Future  4. Mixed hyperlipidemia - Lipid panel; Future  5. Hypothyroidism, unspecified hypothyroidism type - TSH; Future

## 2014-05-25 ENCOUNTER — Other Ambulatory Visit: Payer: Self-pay | Admitting: Internal Medicine

## 2014-06-06 ENCOUNTER — Other Ambulatory Visit: Payer: Self-pay | Admitting: Internal Medicine

## 2014-06-13 ENCOUNTER — Encounter: Payer: Self-pay | Admitting: Nurse Practitioner

## 2014-06-13 ENCOUNTER — Ambulatory Visit (INDEPENDENT_AMBULATORY_CARE_PROVIDER_SITE_OTHER): Payer: Medicare Other | Admitting: Nurse Practitioner

## 2014-06-13 VITALS — BP 120/76 | HR 72 | Temp 98.2°F | Wt 179.0 lb

## 2014-06-13 DIAGNOSIS — J0100 Acute maxillary sinusitis, unspecified: Secondary | ICD-10-CM | POA: Diagnosis not present

## 2014-06-13 MED ORDER — AMOXICILLIN 500 MG PO CAPS: 1000.0000 mg | ORAL_CAPSULE | Freq: Three times a day (TID) | ORAL | Status: DC

## 2014-06-13 NOTE — Progress Notes (Signed)
Patient ID: Phillip Larson, male   DOB: May 28, 1934, 80 y.o.   MRN: [id number]    PCP: Estill Dooms, MD  Allergies  Allergen Reactions  . Iodine Swelling    Internal iodine  . Iohexol Hives         Chief Complaint  Patient presents with  . Acute Visit    cold for 7 days, coughing, head congestion, blood when he blows his nose or cough up phlegm      HPI: Patient is a 80 y.o. male seen in the office today for cc: head congestion. Has been going on for 7 days. Not getting any better. Stopped up nose. Blood in his nose and mouth from coughing. Coughing up yellow bloody sputum. Has been taking mucinex for 1 week, not helping.  No sore throat, no chest congestion, no worsening shortness of breath.  Decrease appetite. Wife encouraging fluid Reports temp of 100.6 3 days ago Review of Systems:  Review of Systems  Constitutional: Positive for appetite change and fatigue. Negative for fever (not currently) and activity change.  HENT: Positive for congestion, postnasal drip, rhinorrhea and sinus pressure. Negative for ear pain, hearing loss, nosebleeds, sore throat and trouble swallowing.   Respiratory: Positive for cough. Negative for shortness of breath.   Cardiovascular: Negative for chest pain and leg swelling.  Neurological: Positive for light-headedness. Negative for dizziness and headaches.    Past Medical History  Diagnosis Date  . Mixed hyperlipidemia   . ALZHEIMERS DISEASE   . Essential hypertension, benign   . CAD   . BENIGN PROSTATIC HYPERTROPHY, HX OF   . Impacted cerumen   . Anxiety state, unspecified   . Pressure ulcer   . Actinic keratosis   . Enthesopathy of hip region   . Unspecified hereditary and idiopathic peripheral neuropathy   . Benign neoplasm of colon   . Unspecified vitamin D deficiency   . Other premature beats   . Diverticulosis of colon (without mention of hemorrhage)   . Unspecified hypothyroidism   . Coronary atherosclerosis of native coronary  artery   . Urinary obstruction, unspecified   . Actinic keratosis   . Pain in joint, site unspecified   . Osteoarthrosis, unspecified whether generalized or localized, unspecified site   . Peptic ulcer, unspecified site, unspecified as acute or chronic, without mention of hemorrhage, perforation, or obstruction   . Other and unspecified hyperlipidemia   . Alzheimer's disease   . Myalgia and myositis, unspecified   . Insomnia, unspecified   . Other malaise and fatigue   . External hemorrhoids without mention of complication   . Anal fissure   . Other psoriasis   . Unspecified essential hypertension   . Depressive disorder, not elsewhere classified   . Cerebrovascular disease, unspecified   . Acute, but ill-defined, cerebrovascular disease   . Reflux esophagitis   . Pain in joint, lower leg   . Palpitations   . Shortness of breath   . Coronary atherosclerosis of native coronary artery   . Impacted cerumen [id number]  . Pressure ulcer of buttock, unstageable   . Unspecified hereditary and idiopathic peripheral neuropathy   . Urinary obstruction, unspecified   . Actinic keratosis   . Osteoarthrosis, unspecified whether generalized or localized, unspecified site   . Peptic ulcer, unspecified site, unspecified as acute or chronic, without mention of hemorrhage, perforation, or obstruction   . Myalgia and myositis, unspecified   . Insomnia, unspecified   . Other malaise and fatigue   .  External hemorrhoids without mention of complication   . Anal fissure   . Depressive disorder, not elsewhere classified   . Cerebrovascular disease, unspecified   . Acute, but ill-defined, cerebrovascular disease   . Palpitations    Past Surgical History  Procedure Laterality Date  . Knee surgery  07/18/2006    Dr French Ana   . Cervical disc surgery    . Laminectomy c3 disc  1972  . Retinal tear repair cryotherapy      Dr Wayne Sever   . Multiple tooth extractions  2014  . Eye surgery Left 02/07/2014     Cataract Surgery Left Eye  . Left heart catheterization with coronary angiogram N/A 01/28/2013    Procedure: LEFT HEART CATHETERIZATION WITH CORONARY ANGIOGRAM;  Surgeon: Josue Hector, MD;  Location: [facility] CATH LAB;  Service: Cardiovascular;  Laterality: N/A;   Social History:   reports that he quit smoking about 22 years ago. His smoking use included Cigarettes. He has a 50 pack-year smoking history. He has never used smokeless tobacco. He reports that he drinks alcohol. He reports that he does not use illicit drugs.  Family History  Problem Relation Age of Onset  . Cancer Father     pancreatic  . Cancer Mother     pancreatic    Medications: Patient's Medications  New Prescriptions   No medications on file  Previous Medications   ALPRAZOLAM (XANAX) 0.5 MG TABLET    TAKE ONE TABLET BY MOUTH TWICE DAILY AS NEEDED FOR ANXIETY   ASPIRIN EC 81 MG TABLET    Take 81 mg by mouth at bedtime.   B COMPLEX VITAMINS TABLET    Take 1 tablet by mouth daily.   CHOLECALCIFEROL (VITAMIN D) 1000 UNITS TABLET    Take 1,000 Units by mouth daily.   DILTIAZEM (CARDIZEM CD) 240 MG 24 HR CAPSULE    Take 1 capsule (240 mg total) by mouth daily.   DOCUSATE SODIUM (COLACE) 100 MG CAPSULE    Take 100 mg by mouth daily.    FINASTERIDE (PROSCAR) 5 MG TABLET    Take 1 tablet (5 mg total) by mouth daily. For prostate   GALANTAMINE (RAZADYNE ER) 24 MG 24 HR CAPSULE    TAKE ONE CAPSULE EVERY DAY WITH BREAKFAST   ISOSORBIDE MONONITRATE (IMDUR) 30 MG 24 HR TABLET    1 EVERY DAY   LEVOTHYROXINE (SYNTHROID, LEVOTHROID) 75 MCG TABLET    Take one tablet by mouth once daily for thyroid   LORATADINE (CLARITIN) 10 MG TABLET    Take 10 mg by mouth daily.   LOTEMAX 0.5 % GEL    as needed.   LOVASTATIN (MEVACOR) 40 MG TABLET    Take 1 tablet (40 mg total) by mouth at bedtime. For cholesterol   MULTIPLE VITAMIN (MULTIVITAMIN WITH MINERALS) TABS TABLET    Take 1 tablet by mouth daily.   NAMENDA XR 28 MG CP24    TAKE 1 TABLET BY  MOUTH EVERY DAY   NITROGLYCERIN (NITROSTAT) 0.4 MG SL TABLET    One under the tongue if needed for chest tightness. Repeat in 5 minutes if needed. Repeat again in 5 minutes if needed.   OMEPRAZOLE (PRILOSEC) 40 MG CAPSULE    Take 40 mg by mouth daily.   TAMSULOSIN (FLOMAX) 0.4 MG CAPS CAPSULE    Take one capsule by mouth once daily to help relieve obstruction   TRAZODONE (DESYREL) 100 MG TABLET    Take one tablet by mouth once at bedtime to  help rest   ZINC GLUCONATE 50 MG TABLET    Take 50 mg by mouth at bedtime.   ZOLPIDEM (AMBIEN) 10 MG TABLET    TAKE ONE TABLET BY MOUTH AT BEDTIME FOR  REST  Modified Medications   No medications on file  Discontinued Medications   No medications on file     Physical Exam:  Filed Vitals:   06/13/14 1534  BP: 120/76  Pulse: 72  Temp: 98.2 F (36.8 C)  TempSrc: Oral  Weight: 179 lb (81.194 kg)  SpO2: 94%    Physical Exam  Constitutional: He is oriented to person, place, and time. He appears well-developed and well-nourished. No distress.  HENT:  Head: Normocephalic and atraumatic.  Right Ear: External ear normal.  Left Ear: External ear normal.  Nose: Rhinorrhea present.  Mouth/Throat: Oropharynx is clear and moist. No oropharyngeal exudate.  Eyes: Conjunctivae and EOM are normal. Pupils are equal, round, and reactive to light.  Neck: Normal range of motion. Neck supple.  Cardiovascular: Normal rate, regular rhythm and normal heart sounds.   Pulmonary/Chest: Effort normal and breath sounds normal.  Musculoskeletal: He exhibits no edema.  Lymphadenopathy:    He has no cervical adenopathy.  Neurological: He is alert and oriented to person, place, and time.  Skin: Skin is warm and dry. He is not diaphoretic.  Psychiatric: He has a normal mood and affect.    Labs reviewed: Basic Metabolic Panel:  Recent Labs  06/16/13 1243 11/25/13 1616 02/15/14 1151  NA 136 141 141  K 4.7 4.4 4.2  CL 101 99 100  CO2 29 26 24   GLUCOSE 106* 102*  103*  BUN 14 14 12   CREATININE 1.2 1.42* 1.40*  CALCIUM 9.4 10.0 9.7  TSH  --  2.630 2.740   Liver Function Tests:  Recent Labs  06/16/13 1243 11/25/13 1616 02/15/14 1151  AST 22 29 19   ALT 21 29 21   ALKPHOS 111 104 116  BILITOT 0.9 0.4 0.6  PROT 6.9 6.7 6.4  ALBUMIN 3.9  --   --    No results for input(s): LIPASE, AMYLASE in the last 8760 hours. No results for input(s): AMMONIA in the last 8760 hours. CBC:  Recent Labs  06/16/13 1243 11/25/13 1616 02/15/14 1151  WBC 6.2 6.3 5.3  NEUTROABS 4.7 4.2 3.3  HGB 15.2 15.0 15.1  HCT 45.6 42.6 45.1  MCV 93.1 93 93  PLT 229.0 202  --    Lipid Panel: No results for input(s): CHOL, HDL, LDLCALC, TRIG, CHOLHDL, LDLDIRECT in the last 8760 hours. TSH:  Recent Labs  11/25/13 1616 02/15/14 1151  TSH 2.630 2.740   A1C: No results found for: HGBA1C   Assessment/Plan  1. Acute maxillary sinusitis, recurrence not specified - amoxicillin (AMOXIL) 500 MG capsule; Take 2 capsules (1,000 mg total) by mouth every 8 (eight) hours.  Dispense: 21 capsule; Refill: 0 -plain nasal saline as needed into nares  -mucinex DM twice daily as needed for cough and congestion -follow up if no improvement after treatment complete or before if symptoms worsen, pt understands.

## 2014-06-13 NOTE — Patient Instructions (Signed)
Amoxicillin 1000 mg every 8 hours for 7 days Use PLAIN nasal saline to spray into nares as needed for congestion mucinex DM by mouth twice daily as needed for cough and congestion Increase water intake   Follow up if symptoms do not improve after 7 day or get worse during the course of treatment    Sinusitis Sinusitis is redness, soreness, and inflammation of the paranasal sinuses. Paranasal sinuses are air pockets within the bones of your face (beneath the eyes, the middle of the forehead, or above the eyes). In healthy paranasal sinuses, mucus is able to drain out, and air is able to circulate through them by way of your nose. However, when your paranasal sinuses are inflamed, mucus and air can become trapped. This can allow bacteria and other germs to grow and cause infection. Sinusitis can develop quickly and last only a short time (acute) or continue over a long period (chronic). Sinusitis that lasts for more than 12 weeks is considered chronic.  CAUSES  Causes of sinusitis include:  Allergies.  Structural abnormalities, such as displacement of the cartilage that separates your nostrils (deviated septum), which can decrease the air flow through your nose and sinuses and affect sinus drainage.  Functional abnormalities, such as when the small hairs (cilia) that line your sinuses and help remove mucus do not work properly or are not present. SIGNS AND SYMPTOMS  Symptoms of acute and chronic sinusitis are the same. The primary symptoms are pain and pressure around the affected sinuses. Other symptoms include:  Upper toothache.  Earache.  Headache.  Bad breath.  Decreased sense of smell and taste.  A cough, which worsens when you are lying flat.  Fatigue.  Fever.  Thick drainage from your nose, which often is green and may contain pus (purulent).  Swelling and warmth over the affected sinuses. DIAGNOSIS  Your health care provider will perform a physical exam. During the  exam, your health care provider may:  Look in your nose for signs of abnormal growths in your nostrils (nasal polyps).  Tap over the affected sinus to check for signs of infection.  View the inside of your sinuses (endoscopy) using an imaging device that has a light attached (endoscope). If your health care provider suspects that you have chronic sinusitis, one or more of the following tests may be recommended:  Allergy tests.  Nasal culture. A sample of mucus is taken from your nose, sent to a lab, and screened for bacteria.  Nasal cytology. A sample of mucus is taken from your nose and examined by your health care provider to determine if your sinusitis is related to an allergy. TREATMENT  Most cases of acute sinusitis are related to a viral infection and will resolve on their own within 10 days. Sometimes medicines are prescribed to help relieve symptoms (pain medicine, decongestants, nasal steroid sprays, or saline sprays).  However, for sinusitis related to a bacterial infection, your health care provider will prescribe antibiotic medicines. These are medicines that will help kill the bacteria causing the infection.  Rarely, sinusitis is caused by a fungal infection. In theses cases, your health care provider will prescribe antifungal medicine. For some cases of chronic sinusitis, surgery is needed. Generally, these are cases in which sinusitis recurs more than 3 times per year, despite other treatments. HOME CARE INSTRUCTIONS   Drink plenty of water. Water helps thin the mucus so your sinuses can drain more easily.  Use a humidifier.  Inhale steam 3 to 4 times  a day (for example, sit in the bathroom with the shower running).  Apply a warm, moist washcloth to your face 3 to 4 times a day, or as directed by your health care provider.  Use saline nasal sprays to help moisten and clean your sinuses.  Take medicines only as directed by your health care provider.  If you were  prescribed either an antibiotic or antifungal medicine, finish it all even if you start to feel better. SEEK IMMEDIATE MEDICAL CARE IF:  You have increasing pain or severe headaches.  You have nausea, vomiting, or drowsiness.  You have swelling around your face.  You have vision problems.  You have a stiff neck.  You have difficulty breathing. MAKE SURE YOU:   Understand these instructions.  Will watch your condition.  Will get help right away if you are not doing well or get worse. Document Released: 04/08/2005 Document Revised: 08/23/2013 Document Reviewed: 04/23/2011 [facility] Patient Information 2015 [place], [place]. This information is not intended to replace advice given to you by your health care provider. Make sure you discuss any questions you have with your health care provider.

## 2014-06-22 ENCOUNTER — Encounter: Payer: Self-pay | Admitting: Internal Medicine

## 2014-06-22 ENCOUNTER — Ambulatory Visit (INDEPENDENT_AMBULATORY_CARE_PROVIDER_SITE_OTHER): Payer: Medicare Other | Admitting: Internal Medicine

## 2014-06-22 VITALS — BP 128/72 | HR 68 | Temp 98.5°F | Resp 20 | Ht 71.0 in | Wt 180.2 lb

## 2014-06-22 DIAGNOSIS — F32A Depression, unspecified: Secondary | ICD-10-CM

## 2014-06-22 DIAGNOSIS — F329 Major depressive disorder, single episode, unspecified: Secondary | ICD-10-CM | POA: Diagnosis not present

## 2014-06-22 DIAGNOSIS — J0100 Acute maxillary sinusitis, unspecified: Secondary | ICD-10-CM | POA: Diagnosis not present

## 2014-06-22 DIAGNOSIS — F0280 Dementia in other diseases classified elsewhere without behavioral disturbance: Secondary | ICD-10-CM

## 2014-06-22 DIAGNOSIS — G309 Alzheimer's disease, unspecified: Secondary | ICD-10-CM | POA: Diagnosis not present

## 2014-06-22 MED ORDER — SERTRALINE HCL 25 MG PO TABS: 25.0000 mg | ORAL_TABLET | Freq: Every day | ORAL | Status: DC

## 2014-06-22 MED ORDER — CEFUROXIME AXETIL 250 MG PO TABS: 250.0000 mg | ORAL_TABLET | Freq: Two times a day (BID) | ORAL | Status: DC

## 2014-06-22 NOTE — Patient Instructions (Addendum)
Recommend you seek counseling to enhance your therapy  Continue other medications as ordered  Push fluids and rest  Keep scheduled appt

## 2014-06-22 NOTE — Progress Notes (Signed)
Patient ID: Phillip Larson, male   DOB: [birth year], 80 y.o.   MRN: [id number]    Facility  [facility]    Place of Service:   OFFICE   Allergies  Allergen Reactions  . Iodine Swelling    Internal iodine  . Iohexol Hives         Chief Complaint  Patient presents with  . Acute Visit    head is still stopped up, ? depression, urinary issues    HPI:  44 to male seen today for acute visit. He c/o urinary leakage with urgency which is a chronic issue. He has BPH. He has accidents frequently but sx's better since URI tx.  His sinusitis is improved but still feels significantly congested. He completed 7 days amoxicillin but is still taking mucinex DM.  He is c/a depression that has worsened last 2-3weeks. He is sleeping more, eating less, feels guilty ('sorry for himself"), anhedonia, cries easily. Hx Alzheimer's dementia. Has taken zoloft in the past after initial stroke. Trazodone helps sleep at night.  Past Medical History  Diagnosis Date  . Mixed hyperlipidemia   . ALZHEIMERS DISEASE   . Essential hypertension, benign   . CAD   . BENIGN PROSTATIC HYPERTROPHY, HX OF   . Impacted cerumen   . Anxiety state, unspecified   . Pressure ulcer   . Actinic keratosis   . Enthesopathy of hip region   . Unspecified hereditary and idiopathic peripheral neuropathy   . Benign neoplasm of colon   . Unspecified vitamin D deficiency   . Other premature beats   . Diverticulosis of colon (without mention of hemorrhage)   . Unspecified hypothyroidism   . Coronary atherosclerosis of native coronary artery   . Urinary obstruction, unspecified   . Actinic keratosis   . Pain in joint, site unspecified   . Osteoarthrosis, unspecified whether generalized or localized, unspecified site   . Peptic ulcer, unspecified site, unspecified as acute or chronic, without mention of hemorrhage, perforation, or obstruction   . Other and unspecified hyperlipidemia   . Alzheimer's disease   . Myalgia and myositis,  unspecified   . Insomnia, unspecified   . Other malaise and fatigue   . External hemorrhoids without mention of complication   . Anal fissure   . Other psoriasis   . Unspecified essential hypertension   . Depressive disorder, not elsewhere classified   . Cerebrovascular disease, unspecified   . Acute, but ill-defined, cerebrovascular disease   . Reflux esophagitis   . Pain in joint, lower leg   . Palpitations   . Shortness of breath   . Coronary atherosclerosis of native coronary artery   . Impacted cerumen [id number]  . Pressure ulcer of buttock, unstageable   . Unspecified hereditary and idiopathic peripheral neuropathy   . Urinary obstruction, unspecified   . Actinic keratosis   . Osteoarthrosis, unspecified whether generalized or localized, unspecified site   . Peptic ulcer, unspecified site, unspecified as acute or chronic, without mention of hemorrhage, perforation, or obstruction   . Myalgia and myositis, unspecified   . Insomnia, unspecified   . Other malaise and fatigue   . External hemorrhoids without mention of complication   . Anal fissure   . Depressive disorder, not elsewhere classified   . Cerebrovascular disease, unspecified   . Acute, but ill-defined, cerebrovascular disease   . Palpitations    History   Social History  . Marital Status: Married    Spouse Name: N/A  . Number of Children:  N/A  . Years of Education: N/A   Occupational History  . retired    Social History Main Topics  . Smoking status: Former Smoker -- 2.00 packs/day for 25 years    Types: Cigarettes    Quit date: 04/22/1992  . Smokeless tobacco: Never Used  . Alcohol Use: Yes     Comment: beer/wine daily  . Drug Use: No  . Sexual Activity: Not Currently   Other Topics Concern  . None   Social History Narrative    Medications: Patient's Medications  New Prescriptions   No medications on file  Previous Medications   ALPRAZOLAM (XANAX) 0.5 MG TABLET    TAKE ONE TABLET BY MOUTH  TWICE DAILY AS NEEDED FOR ANXIETY   ASPIRIN EC 81 MG TABLET    Take 81 mg by mouth at bedtime.   B COMPLEX VITAMINS TABLET    Take 1 tablet by mouth daily.   CHOLECALCIFEROL (VITAMIN D) 1000 UNITS TABLET    Take 1,000 Units by mouth daily.   DILTIAZEM (CARDIZEM CD) 240 MG 24 HR CAPSULE    Take 1 capsule (240 mg total) by mouth daily.   DOCUSATE SODIUM (COLACE) 100 MG CAPSULE    Take 100 mg by mouth daily.    FINASTERIDE (PROSCAR) 5 MG TABLET    Take 1 tablet (5 mg total) by mouth daily. For prostate   GALANTAMINE (RAZADYNE ER) 24 MG 24 HR CAPSULE    TAKE ONE CAPSULE EVERY DAY WITH BREAKFAST   ISOSORBIDE MONONITRATE (IMDUR) 30 MG 24 HR TABLET    1 EVERY DAY   LEVOTHYROXINE (SYNTHROID, LEVOTHROID) 75 MCG TABLET    Take one tablet by mouth once daily for thyroid   LORATADINE (CLARITIN) 10 MG TABLET    Take 10 mg by mouth daily.   LOTEMAX 0.5 % GEL    as needed.   LOVASTATIN (MEVACOR) 40 MG TABLET    Take 1 tablet (40 mg total) by mouth at bedtime. For cholesterol   MULTIPLE VITAMIN (MULTIVITAMIN WITH MINERALS) TABS TABLET    Take 1 tablet by mouth daily.   NAMENDA XR 28 MG CP24    TAKE 1 TABLET BY MOUTH EVERY DAY   NITROGLYCERIN (NITROSTAT) 0.4 MG SL TABLET    One under the tongue if needed for chest tightness. Repeat in 5 minutes if needed. Repeat again in 5 minutes if needed.   OMEPRAZOLE (PRILOSEC) 40 MG CAPSULE    Take 40 mg by mouth daily.   TAMSULOSIN (FLOMAX) 0.4 MG CAPS CAPSULE    Take one capsule by mouth once daily to help relieve obstruction   TRAZODONE (DESYREL) 100 MG TABLET    Take one tablet by mouth once at bedtime to help rest   ZINC GLUCONATE 50 MG TABLET    Take 50 mg by mouth at bedtime.   ZOLPIDEM (AMBIEN) 10 MG TABLET    TAKE ONE TABLET BY MOUTH AT BEDTIME FOR  REST  Modified Medications   No medications on file  Discontinued Medications   AMOXICILLIN (AMOXIL) 500 MG CAPSULE    Take 2 capsules (1,000 mg total) by mouth every 8 (eight) hours.     Review of Systems    Constitutional: Positive for activity change, appetite change and fatigue.  HENT: Positive for congestion, postnasal drip, rhinorrhea and sinus pressure. Negative for ear discharge, ear pain, mouth sores, sneezing and sore throat.   Respiratory: Positive for cough. Negative for shortness of breath.   Cardiovascular: Negative for chest pain.  Genitourinary: Positive for urgency.  Psychiatric/Behavioral: Positive for sleep disturbance. Negative for suicidal ideas. The patient is nervous/anxious.   All other systems reviewed and are negative.   Filed Vitals:   06/22/14 1539  BP: 128/72  Pulse: 68  Temp: 98.5 F (36.9 C)  TempSrc: Oral  Resp: 20  Height: 5\' 11"  (1.803 m)  Weight: 180 lb 3.2 oz (81.738 kg)  SpO2: 97%   Body mass index is 25.14 kg/(m^2).  Physical Exam  Constitutional: He is oriented to person, place, and time. He appears well-developed and well-nourished.  HENT:  Nose: Right sinus exhibits maxillary sinus tenderness. Right sinus exhibits no frontal sinus tenderness. Left sinus exhibits maxillary sinus tenderness. Left sinus exhibits no frontal sinus tenderness.  Mouth/Throat: Mucous membranes are normal. Posterior oropharyngeal erythema present.  Eyes: Pupils are equal, round, and reactive to light. No scleral icterus.  Neck: Neck supple. No thyromegaly present.  Cardiovascular: Normal rate, regular rhythm, normal heart sounds and intact distal pulses.  Exam reveals no gallop and no friction rub.   No murmur heard. No carotid bruit b/l; no distal LE swelling  Pulmonary/Chest: Effort normal and breath sounds normal. He has no wheezes. He has no rhonchi. He has no rales. He exhibits no tenderness.  Abdominal: Soft. Bowel sounds are normal. He exhibits no distension, no abdominal bruit, no pulsatile midline mass and no mass. There is no tenderness. There is no rebound, no guarding and no CVA tenderness.  Lymphadenopathy:    He has no cervical adenopathy.  Neurological:  He is alert and oriented to person, place, and time. He has normal reflexes.  Skin: Skin is warm and dry. No rash noted.  Psychiatric: His behavior is normal. Judgment and thought content normal. His affect is blunt. He exhibits a depressed mood.     Labs reviewed: No visits with results within 3 Month(s) from this visit. Latest known visit with results is:  Office Visit on 02/15/2014  Component Date Value Ref Range Status  . WBC 02/15/2014 5.3  3.4 - 10.8 x10E3/uL Final  . RBC 02/15/2014 4.86  4.14 - 5.80 x10E6/uL Final  . Hemoglobin 02/15/2014 15.1  12.6 - 17.7 g/dL Final  . HCT 02/15/2014 45.1  37.5 - 51.0 % Final  . MCV 02/15/2014 93  79 - 97 fL Final  . MCH 02/15/2014 31.1  26.6 - 33.0 pg Final  . MCHC 02/15/2014 33.5  31.5 - 35.7 g/dL Final  . RDW 02/15/2014 12.8  12.3 - 15.4 % Final  . Neutrophils Relative % 02/15/2014 62   Final  . Lymphs 02/15/2014 22   Final  . Monocytes 02/15/2014 7   Final  . Eos 02/15/2014 8   Final  . Basos 02/15/2014 1   Final  . Neutrophils Absolute 02/15/2014 3.3  1.4 - 7.0 x10E3/uL Final  . Lymphocytes Absolute 02/15/2014 1.2  0.7 - 3.1 x10E3/uL Final  . Monocytes Absolute 02/15/2014 0.4  0.1 - 0.9 x10E3/uL Final  . Eosinophils Absolute 02/15/2014 0.4  0.0 - 0.4 x10E3/uL Final  . Basophils Absolute 02/15/2014 0.0  0.0 - 0.2 x10E3/uL Final  . Immature Granulocytes 02/15/2014 0   Final  . Immature Grans (Abs) 02/15/2014 0.0  0.0 - 0.1 x10E3/uL Final  . TSH 02/15/2014 2.740  0.450 - 4.500 uIU/mL Final  . Glucose 02/15/2014 103* 65 - 99 mg/dL Final  . BUN 02/15/2014 12  8 - 27 mg/dL Final  . Creatinine, Ser 02/15/2014 1.40* 0.76 - 1.27 mg/dL Final  . GFR calc  non Af Amer 02/15/2014 48* >59 mL/min/1.73 Final  . GFR calc Af Amer 02/15/2014 56* >59 mL/min/1.73 Final  . BUN/Creatinine Ratio 02/15/2014 9* 10 - 22 Final  . Sodium 02/15/2014 141  134 - 144 mmol/L Final  . Potassium 02/15/2014 4.2  3.5 - 5.2 mmol/L Final  . Chloride 02/15/2014 100  97 -  108 mmol/L Final  . CO2 02/15/2014 24  18 - 29 mmol/L Final  . Calcium 02/15/2014 9.7  8.6 - 10.2 mg/dL Final  . Total Protein 02/15/2014 6.4  6.0 - 8.5 g/dL Final  . Albumin 02/15/2014 4.5  3.5 - 4.8 g/dL Final  . Globulin, Total 02/15/2014 1.9  1.5 - 4.5 g/dL Final  . Albumin/Globulin Ratio 02/15/2014 2.4  1.1 - 2.5 Final  . Total Bilirubin 02/15/2014 0.6  0.0 - 1.2 mg/dL Final  . Alkaline Phosphatase 02/15/2014 116  39 - 117 IU/L Final  . AST 02/15/2014 19  0 - 40 IU/L Final  . ALT 02/15/2014 21  0 - 44 IU/L Final     Assessment/Plan   ICD-9-CM ICD-10-CM   1. Acute maxillary sinusitis, recurrence not specified- failing to change as expected 461.0 J01.00 cefUROXime (CEFTIN) 250 MG tablet  2. Depression - new onset 311 F32.9 sertraline (ZOLOFT) 25 MG tablet  3. ALZHEIMERS DISEASE - stable 331.0 G30.9     --Recommend mental health counseling to enhance your therapy  --Continue other medications as ordered  --Push fluids and rest. Take all of abx  --Keep scheduled appt   Emogene Muratalla S. Perlie Gold  [facility] [address] [place], [place] [address] [phone] Office (Wednesdays and Fridays 8 AM - 5 PM) [phone] Cell (Monday-Friday 8 AM - 5 PM)

## 2014-07-06 ENCOUNTER — Other Ambulatory Visit: Payer: Self-pay

## 2014-07-06 MED ORDER — MEMANTINE HCL ER 28 MG PO CP24: 28.0000 mg | ORAL_CAPSULE | Freq: Every day | ORAL | Status: DC

## 2014-07-06 NOTE — Telephone Encounter (Signed)
Message left on triage voicemail, needs rx for Namenda sent to new pharmacy ([facility] in [place]).  Patients wife aware rx sent

## 2014-07-23 ENCOUNTER — Other Ambulatory Visit: Payer: Self-pay | Admitting: Internal Medicine

## 2014-07-25 ENCOUNTER — Other Ambulatory Visit: Payer: Self-pay | Admitting: *Deleted

## 2014-07-25 MED ORDER — ALPRAZOLAM 0.25 MG PO TABS: 0.2500 mg | ORAL_TABLET | Freq: Two times a day (BID) | ORAL | Status: DC | PRN

## 2014-07-25 MED ORDER — ZOLPIDEM TARTRATE 10 MG PO TABS: ORAL_TABLET | ORAL | Status: DC

## 2014-07-25 NOTE — Addendum Note (Signed)
Addended by: Logan Bores on: 07/25/2014 10:39 AM   Modules accepted: Orders

## 2014-07-25 NOTE — Telephone Encounter (Signed)
[facility] has not received Rx for Zolpidem. Reprinted and faxed. Also needed a refill on his Xanax.

## 2014-07-26 ENCOUNTER — Other Ambulatory Visit: Payer: Self-pay | Admitting: Internal Medicine

## 2014-08-17 DIAGNOSIS — H15101 Unspecified episcleritis, right eye: Secondary | ICD-10-CM | POA: Diagnosis not present

## 2014-08-17 DIAGNOSIS — H26491 Other secondary cataract, right eye: Secondary | ICD-10-CM | POA: Diagnosis not present

## 2014-08-17 DIAGNOSIS — H40013 Open angle with borderline findings, low risk, bilateral: Secondary | ICD-10-CM | POA: Diagnosis not present

## 2014-08-17 DIAGNOSIS — Z961 Presence of intraocular lens: Secondary | ICD-10-CM | POA: Diagnosis not present

## 2014-09-04 IMAGING — CR DG CHEST 2V
2 series · 2 of 2 positions shown · non-contrast
Comparison: PA and lateral chest 06/07/2010.

CLINICAL DATA: Chronic dyspnea.

CHEST - 2 VIEW

[view not recorded (1 of 2)]
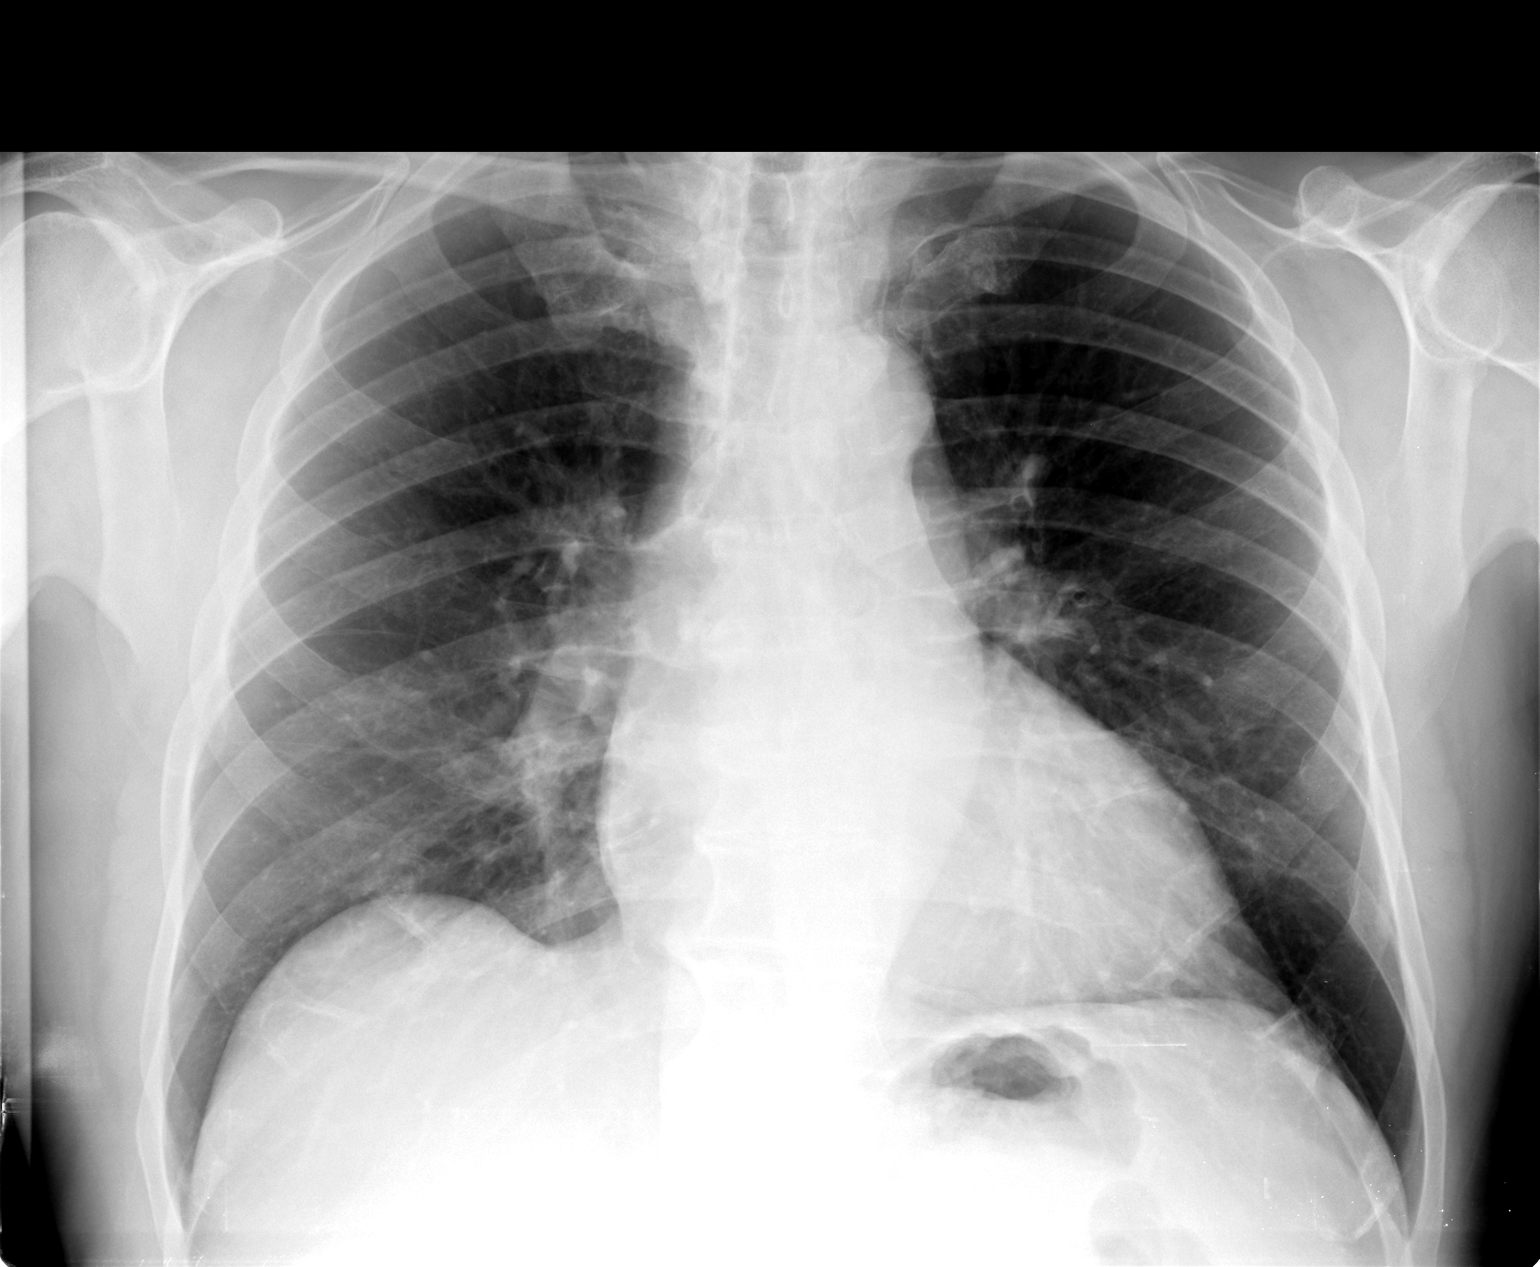

[view not recorded (2 of 2)]
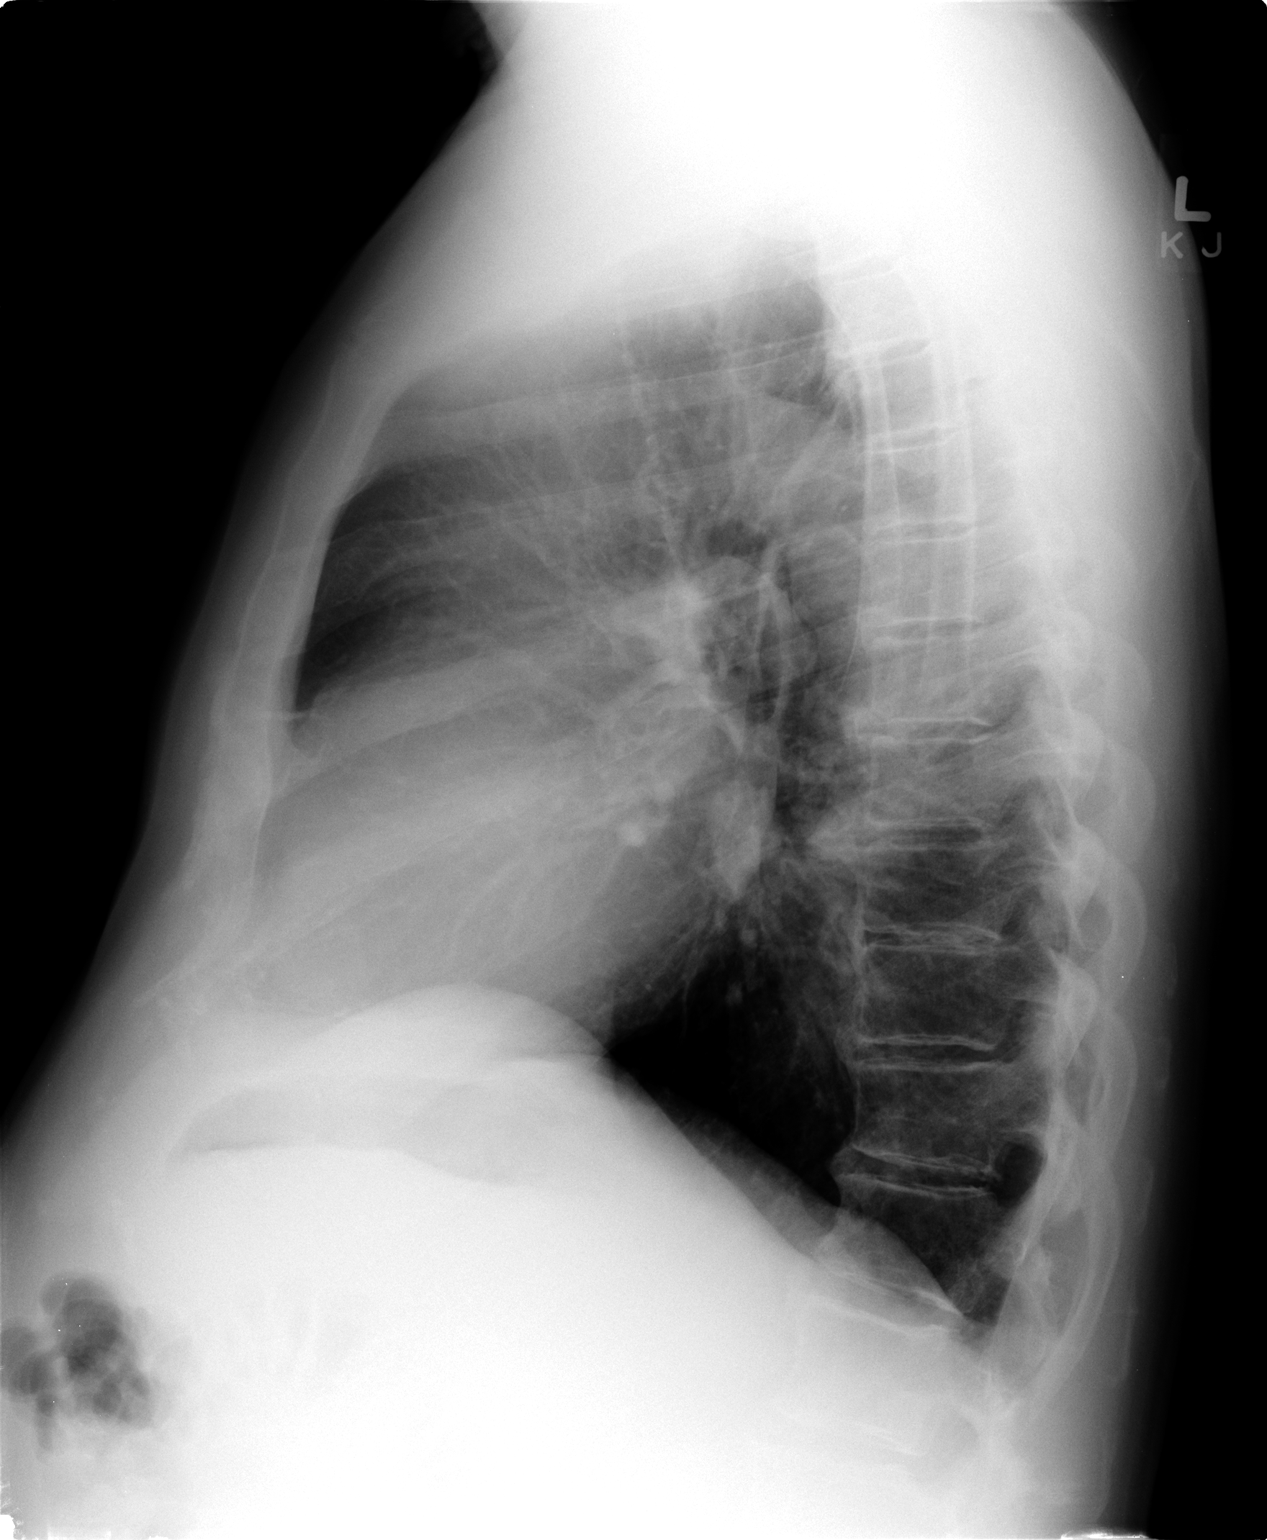

[2 of 2 positions shown; findings below may reference images not displayed]

FINDINGS: Lungs are clear.  Heart size is normal.  No pneumothorax
or pleural fluid.
IMPRESSION: Negative chest.

## 2014-09-13 NOTE — Progress Notes (Signed)
Patient ID: Phillip Larson, male   DOB: 09/15/1934, 80 y.o.   MRN: [id number] Phillip Larson is seen today for followup of his blood pressure.. He has been more active. He is watching the salt in his diet. He also has hypercholesterolemia  He has CAD with a total RCA that is collaterized. He had a low risk myovue in 2009 .   Cath done 01/28/13 from right radial approach and no changes with chronically occluded RCA , robust collaterals and no significant left sided disease   Playing tennis 3x/week no chest pain needs refills on cardizem and imdur.   Has a "catch" under right ribs  Chronic Saw ? GI doctor and no GB disease   ROS: Denies fever, malais, weight loss, blurry vision, decreased visual acuity, cough, sputum, SOB, hemoptysis, pleuritic pain, palpitaitons, heartburn, abdominal pain, melena, lower extremity edema, claudication, or rash.  All other systems reviewed and negative  General: Affect appropriate Healthy:  appears stated age 82: normal Neck supple with no adenopathy JVP normal no bruits no thyromegaly Lungs clear with no wheezing and good diaphragmatic motion Heart:  S1/S2 no murmur, no rub, gallop or click PMI normal Abdomen: benighn, BS positve, no tenderness, no AAA no bruit.  No HSM or HJR Distal pulses intact with no bruits No edema Neuro non-focal Skin warm and dry No muscular weakness   Current Outpatient Prescriptions  Medication Sig Dispense Refill  . ALPRAZolam (XANAX) 0.25 MG tablet TAKE ONE TABLET BY MOUTH TWICE DAILY AS NEEDED FOR ANXIETY 60 tablet 0  . ALPRAZolam (XANAX) 0.5 MG tablet TAKE ONE TABLET BY MOUTH TWICE DAILY AS NEEDED FOR ANXIETY 60 tablet 0  . aspirin EC 81 MG tablet Take 81 mg by mouth at bedtime.    [name] b complex vitamins tablet Take 1 tablet by mouth daily.    . cefUROXime (CEFTIN) 250 MG tablet Take 1 tablet (250 mg total) by mouth 2 (two) times daily with a meal. 20 tablet 0  . cholecalciferol (VITAMIN D) 1000 UNITS tablet Take 1,000 Units  by mouth daily.    [name] diltiazem (CARDIZEM CD) 240 MG 24 hr capsule Take 1 capsule (240 mg total) by mouth daily. 90 capsule 3  . docusate sodium (COLACE) 100 MG capsule Take 100 mg by mouth daily.     . finasteride (PROSCAR) 5 MG tablet Take 1 tablet (5 mg total) by mouth daily. For prostate 90 tablet 1  . galantamine (RAZADYNE ER) 24 MG 24 hr capsule TAKE ONE CAPSULE EVERY DAY WITH BREAKFAST 30 capsule 5  . isosorbide mononitrate (IMDUR) 30 MG 24 hr tablet 1 EVERY DAY 90 tablet 3  . levothyroxine (SYNTHROID, LEVOTHROID) 75 MCG tablet Take one tablet by mouth once daily for thyroid 90 tablet 1  . loratadine (CLARITIN) 10 MG tablet Take 10 mg by mouth daily.    [name] LOTEMAX 0.5 % GEL as needed.    . lovastatin (MEVACOR) 40 MG tablet Take 1 tablet (40 mg total) by mouth at bedtime. For cholesterol 90 tablet 1  . memantine (NAMENDA XR) 28 MG CP24 24 hr capsule Take 1 capsule (28 mg total) by mouth daily. 90 capsule 1  . Multiple Vitamin (MULTIVITAMIN WITH MINERALS) TABS tablet Take 1 tablet by mouth daily.    . nitroGLYCERIN (NITROSTAT) 0.4 MG SL tablet One under the tongue if needed for chest tightness. Repeat in 5 minutes if needed. Repeat again in 5 minutes if needed. 25 tablet 12  . omeprazole (PRILOSEC) 40 MG capsule Take  40 mg by mouth daily.    . sertraline (ZOLOFT) 25 MG tablet Take 1 tablet (25 mg total) by mouth daily. 30 tablet 6  . tamsulosin (FLOMAX) 0.4 MG CAPS capsule Take one capsule by mouth once daily to help relieve obstruction 30 capsule 6  . traZODone (DESYREL) 100 MG tablet Take one tablet by mouth once at bedtime to help rest 90 tablet 1  . zinc gluconate 50 MG tablet Take 50 mg by mouth at bedtime.    [name] zolpidem (AMBIEN) 10 MG tablet Take one tablet by mouth once daily at bedtime for rest 30 tablet 1   No current facility-administered medications for this visit.    Allergies  Iodine and Iohexol  Electrocardiogram:   03/08/14  SR rate 63  Old IMI  No change from 2014    Assessment and Plan CAD:  Stable with no angina and good activity level.  Continue medical Rx Chronic RCA occlusion with collaterals no hemodynically significant left sided disease cath 2015  Chol:  On statin Cholesterol is at goal.  Continue current dose of statin and diet Rx.  No myalgias or side effects.  F/U  LFT's in 6 months. No results found for: [facility] labs with primary   Depression:  Memory seems worse today continue zoloft  F/u primary

## 2014-09-14 ENCOUNTER — Ambulatory Visit (INDEPENDENT_AMBULATORY_CARE_PROVIDER_SITE_OTHER): Payer: Medicare Other | Admitting: Cardiovascular Disease

## 2014-09-14 ENCOUNTER — Ambulatory Visit: Payer: Self-pay | Admitting: Cardiovascular Disease

## 2014-09-14 ENCOUNTER — Encounter: Payer: Self-pay | Admitting: Cardiovascular Disease

## 2014-09-14 VITALS — BP 100/50 | HR 70 | Ht 71.0 in | Wt 177.6 lb

## 2014-09-14 DIAGNOSIS — I2510 Atherosclerotic heart disease of native coronary artery without angina pectoris: Secondary | ICD-10-CM | POA: Diagnosis not present

## 2014-09-14 DIAGNOSIS — I2583 Coronary atherosclerosis due to lipid rich plaque: Principal | ICD-10-CM

## 2014-09-14 NOTE — Patient Instructions (Signed)
Medication Instructions:  NO CHANGES  Labwork: NONE  Testing/Procedures: NONE  Follow-Up: Your physician wants you to follow-up in: YEAR WITH  DR NISHAN You will receive a reminder letter in the mail two months in advance. If you don't receive a letter, please call our office to schedule the follow-up appointment.   Any Other Special Instructions Will Be Listed Below (If Applicable).   

## 2014-09-27 ENCOUNTER — Other Ambulatory Visit: Payer: Self-pay | Admitting: Internal Medicine

## 2014-09-29 ENCOUNTER — Other Ambulatory Visit: Payer: Self-pay | Admitting: Internal Medicine

## 2014-10-18 DIAGNOSIS — L570 Actinic keratosis: Secondary | ICD-10-CM | POA: Diagnosis not present

## 2014-10-25 ENCOUNTER — Other Ambulatory Visit: Payer: Self-pay | Admitting: Internal Medicine

## 2014-11-15 ENCOUNTER — Encounter: Payer: Self-pay | Admitting: Internal Medicine

## 2014-11-15 ENCOUNTER — Ambulatory Visit (INDEPENDENT_AMBULATORY_CARE_PROVIDER_SITE_OTHER): Payer: Medicare Other | Admitting: Internal Medicine

## 2014-11-15 VITALS — BP 120/80 | HR 73 | Temp 98.5°F | Resp 20 | Ht 71.0 in | Wt 173.8 lb

## 2014-11-15 DIAGNOSIS — E038 Other specified hypothyroidism: Secondary | ICD-10-CM

## 2014-11-15 DIAGNOSIS — F0280 Dementia in other diseases classified elsewhere without behavioral disturbance: Secondary | ICD-10-CM

## 2014-11-15 DIAGNOSIS — E782 Mixed hyperlipidemia: Secondary | ICD-10-CM

## 2014-11-15 DIAGNOSIS — N401 Enlarged prostate with lower urinary tract symptoms: Secondary | ICD-10-CM

## 2014-11-15 DIAGNOSIS — G4700 Insomnia, unspecified: Secondary | ICD-10-CM

## 2014-11-15 DIAGNOSIS — G309 Alzheimer's disease, unspecified: Secondary | ICD-10-CM | POA: Diagnosis not present

## 2014-11-15 DIAGNOSIS — N138 Other obstructive and reflux uropathy: Secondary | ICD-10-CM

## 2014-11-15 DIAGNOSIS — N139 Obstructive and reflux uropathy, unspecified: Secondary | ICD-10-CM

## 2014-11-15 DIAGNOSIS — F5104 Psychophysiologic insomnia: Secondary | ICD-10-CM

## 2014-11-15 DIAGNOSIS — I10 Essential (primary) hypertension: Secondary | ICD-10-CM | POA: Diagnosis not present

## 2014-11-15 MED ORDER — ALPRAZOLAM 0.5 MG PO TABS: 0.5000 mg | ORAL_TABLET | Freq: Two times a day (BID) | ORAL | Status: DC | PRN

## 2014-11-15 MED ORDER — TRAZODONE HCL 100 MG PO TABS: ORAL_TABLET | ORAL | Status: DC

## 2014-11-15 MED ORDER — ZOLPIDEM TARTRATE 10 MG PO TABS: ORAL_TABLET | ORAL | Status: DC

## 2014-11-15 MED ORDER — FINASTERIDE 5 MG PO TABS: 5.0000 mg | ORAL_TABLET | Freq: Every day | ORAL | Status: DC

## 2014-11-15 MED ORDER — LEVOTHYROXINE SODIUM 75 MCG PO TABS: ORAL_TABLET | ORAL | Status: DC

## 2014-11-15 MED ORDER — TAMSULOSIN HCL 0.4 MG PO CAPS: ORAL_CAPSULE | ORAL | Status: DC

## 2014-11-15 NOTE — Progress Notes (Signed)
Patient ID: Phillip Larson, male   DOB: 04/09/1935, 80 y.o.   MRN: [id number]    Facility  [facility]    Place of Service:   OFFICE    Allergies  Allergen Reactions  . Iodine Swelling    Internal iodine  . Iohexol Hives         Chief Complaint  Patient presents with  . Medical Management of Chronic Issues    HPI:  Obstructed, uropathy -  Continues to have problems with occasional incontinence  Other specified hypothyroidism - last TSH Oct 2015 was normal  Essential hypertension, benign - controlled  ALZHEIMERS DISEASE - unchanged. Recent memory impaired.  Mixed hyperlipidemia: Controlled  Chronic insomnia: Continues to use his current medications as well as at least 2 ounces of gin prior to going to bed.   Medications: Patient's Medications  New Prescriptions   No medications on file  Previous Medications   ASPIRIN EC 81 MG TABLET    Take 81 mg by mouth at bedtime.   B COMPLEX VITAMINS TABLET    Take 1 tablet by mouth daily.   CEFUROXIME (CEFTIN) 250 MG TABLET    Take 1 tablet (250 mg total) by mouth 2 (two) times daily with a meal.   CHOLECALCIFEROL (VITAMIN D) 1000 UNITS TABLET    Take 1,000 Units by mouth daily.   DILTIAZEM (CARDIZEM CD) 240 MG 24 HR CAPSULE    Take 1 capsule (240 mg total) by mouth daily.   DOCUSATE SODIUM (COLACE) 100 MG CAPSULE    Take 100 mg by mouth daily.    GALANTAMINE (RAZADYNE ER) 24 MG 24 HR CAPSULE    TAKE ONE CAPSULE EVERY DAY WITH BREAKFAST   ISOSORBIDE MONONITRATE (IMDUR) 30 MG 24 HR TABLET    1 EVERY DAY   LORATADINE (CLARITIN) 10 MG TABLET    Take 10 mg by mouth daily.   LOTEMAX 0.5 % GEL    as needed.   LOVASTATIN (MEVACOR) 40 MG TABLET    Take 1 tablet (40 mg total) by mouth at bedtime. For cholesterol   MEMANTINE (NAMENDA XR) 28 MG CP24 24 HR CAPSULE    Take 1 capsule (28 mg total) by mouth daily.   MULTIPLE VITAMIN (MULTIVITAMIN WITH MINERALS) TABS TABLET    Take 1 tablet by mouth daily.   NITROGLYCERIN (NITROSTAT) 0.4 MG SL  TABLET    One under the tongue if needed for chest tightness. Repeat in 5 minutes if needed. Repeat again in 5 minutes if needed.   OMEPRAZOLE (PRILOSEC) 40 MG CAPSULE    Take 40 mg by mouth daily.   SERTRALINE (ZOLOFT) 25 MG TABLET    Take 1 tablet (25 mg total) by mouth daily.   ZINC GLUCONATE 50 MG TABLET    Take 50 mg by mouth at bedtime.  Modified Medications   Modified Medication Previous Medication   ALPRAZOLAM (XANAX) 0.5 MG TABLET ALPRAZolam (XANAX) 0.5 MG tablet      Take 1 tablet (0.5 mg total) by mouth 2 (two) times daily as needed. for anxiety    TAKE ONE TABLET BY MOUTH TWICE DAILY AS NEEDED FOR ANXIETY   FINASTERIDE (PROSCAR) 5 MG TABLET finasteride (PROSCAR) 5 MG tablet      Take 1 tablet (5 mg total) by mouth daily. For prostate    Take 1 tablet (5 mg total) by mouth daily. For prostate   LEVOTHYROXINE (SYNTHROID, LEVOTHROID) 75 MCG TABLET levothyroxine (SYNTHROID, LEVOTHROID) 75 MCG tablet  Take one tablet by mouth once daily for thyroid    Take one tablet by mouth once daily for thyroid   TAMSULOSIN (FLOMAX) 0.4 MG CAPS CAPSULE tamsulosin (FLOMAX) 0.4 MG CAPS capsule      Take one capsule by mouth once daily to help relieve obstruction    Take one capsule by mouth once daily to help relieve obstruction   TRAZODONE (DESYREL) 100 MG TABLET traZODone (DESYREL) 100 MG tablet      Take one tablet by mouth once at bedtime to help rest    Take one tablet by mouth once at bedtime to help rest   ZOLPIDEM (AMBIEN) 10 MG TABLET zolpidem (AMBIEN) 10 MG tablet      TAKE ONE TABLET BY MOUTH AT BEDTIME FOR REST    TAKE ONE TABLET BY MOUTH AT BEDTIME FOR REST  Discontinued Medications   No medications on file     Review of Systems  Constitutional: Negative.   HENT: Positive for hearing loss. Negative for ear pain.   Eyes: Negative.   Respiratory: Negative.  Negative for cough, choking, chest tightness and wheezing.   Cardiovascular: Positive for chest pain (right side lower chest  discomfort has resolved). Negative for palpitations and leg swelling.  Gastrointestinal: Negative.   Endocrine: Negative.   Genitourinary: Positive for frequency.       History of incontinence of urine at night, but this has improved lately. Hx BPH.  Using tamsulosin and finasteride.  Musculoskeletal: Positive for back pain and arthralgias.  Skin: Negative.   Allergic/Immunologic: Negative.   Neurological: Negative for tremors, syncope, weakness and headaches.       Dementia  Hematological: Negative.   Psychiatric/Behavioral: Negative.     Filed Vitals:   11/15/14 1639  BP: 120/80  Pulse: 73  Temp: 98.5 F (36.9 C)  TempSrc: Oral  Resp: 20  Height: 5\' 11"  (1.803 m)  Weight: 173 lb 12.8 oz (78.835 kg)  SpO2: 97%   Body mass index is 24.25 kg/(m^2).  Physical Exam  Constitutional: He appears well-developed and well-nourished. No distress.  HENT:  Head: Normocephalic and atraumatic.  Nose: Nose normal.  Loss of hearing.  Eyes: Conjunctivae and EOM are normal. Pupils are equal, round, and reactive to light.  Neck: Normal range of motion. Neck supple. No JVD present. No tracheal deviation present. No thyromegaly present.  Cardiovascular: Normal rate, regular rhythm, normal heart sounds and intact distal pulses.  Exam reveals no gallop and no friction rub.   No murmur heard. Pulmonary/Chest: Effort normal and breath sounds normal. No respiratory distress. He has no wheezes. He has no rales. He exhibits no tenderness.  No focal pain on palpation.  Abdominal: Soft. Bowel sounds are normal. He exhibits no distension and no mass. There is no tenderness.  Long-term right upper abdomen lump that seems most consistent with lipoma and that is unlikely to be a ventral hernia, but I cannot fully rule this out.  Musculoskeletal: Normal range of motion. He exhibits no edema or tenderness.  Lymphadenopathy:    He has no cervical adenopathy.  Neurological: No cranial nerve deficit.  Coordination normal.  Mild dementia.  Skin: Skin is warm and dry. No rash noted. No erythema. No pallor.  Area suspicious for lipoma under the ribs and the right upper abdomen.  Psychiatric: He has a normal mood and affect. His behavior is normal. Thought content normal.  Memory loss. Increased anxiety. Not depressed.     Labs reviewed: No visits with results within 3  Month(s) from this visit. Latest known visit with results is:  Office Visit on 02/15/2014  Component Date Value Ref Range Status  . WBC 02/15/2014 5.3  3.4 - 10.8 x10E3/uL Final  . RBC 02/15/2014 4.86  4.14 - 5.80 x10E6/uL Final  . Hemoglobin 02/15/2014 15.1  12.6 - 17.7 g/dL Final  . HCT 02/15/2014 45.1  37.5 - 51.0 % Final  . MCV 02/15/2014 93  79 - 97 fL Final  . MCH 02/15/2014 31.1  26.6 - 33.0 pg Final  . MCHC 02/15/2014 33.5  31.5 - 35.7 g/dL Final  . RDW 02/15/2014 12.8  12.3 - 15.4 % Final  . Neutrophils Relative % 02/15/2014 62   Final  . Lymphs 02/15/2014 22   Final  . Monocytes 02/15/2014 7   Final  . Eos 02/15/2014 8   Final  . Basos 02/15/2014 1   Final  . Neutrophils Absolute 02/15/2014 3.3  1.4 - 7.0 x10E3/uL Final  . Lymphocytes Absolute 02/15/2014 1.2  0.7 - 3.1 x10E3/uL Final  . Monocytes Absolute 02/15/2014 0.4  0.1 - 0.9 x10E3/uL Final  . Eosinophils Absolute 02/15/2014 0.4  0.0 - 0.4 x10E3/uL Final  . Basophils Absolute 02/15/2014 0.0  0.0 - 0.2 x10E3/uL Final  . Immature Granulocytes 02/15/2014 0   Final  . Immature Grans (Abs) 02/15/2014 0.0  0.0 - 0.1 x10E3/uL Final  . TSH 02/15/2014 2.740  0.450 - 4.500 uIU/mL Final  . Glucose 02/15/2014 103* 65 - 99 mg/dL Final  . BUN 02/15/2014 12  8 - 27 mg/dL Final  . Creatinine, Ser 02/15/2014 1.40* 0.76 - 1.27 mg/dL Final  . GFR calc non Af Amer 02/15/2014 48* >59 mL/min/1.73 Final  . GFR calc Af Amer 02/15/2014 56* >59 mL/min/1.73 Final  . BUN/Creatinine Ratio 02/15/2014 9* 10 - 22 Final  . Sodium 02/15/2014 141  134 - 144 mmol/L Final  .  Potassium 02/15/2014 4.2  3.5 - 5.2 mmol/L Final  . Chloride 02/15/2014 100  97 - 108 mmol/L Final  . CO2 02/15/2014 24  18 - 29 mmol/L Final  . Calcium 02/15/2014 9.7  8.6 - 10.2 mg/dL Final  . Total Protein 02/15/2014 6.4  6.0 - 8.5 g/dL Final  . Albumin 02/15/2014 4.5  3.5 - 4.8 g/dL Final  . Globulin, Total 02/15/2014 1.9  1.5 - 4.5 g/dL Final  . Albumin/Globulin Ratio 02/15/2014 2.4  1.1 - 2.5 Final  . Total Bilirubin 02/15/2014 0.6  0.0 - 1.2 mg/dL Final  . Alkaline Phosphatase 02/15/2014 116  39 - 117 IU/L Final  . AST 02/15/2014 19  0 - 40 IU/L Final  . ALT 02/15/2014 21  0 - 44 IU/L Final     Assessment/Plan   1. Obstructed, uropathy -Recommend urology consultation -Continue tamsulosin - finasteride (PROSCAR) 5 MG tablet; Take 1 tablet (5 mg total) by mouth daily. For prostate  Dispense: 90 tablet; Refill: 1 - Urinalysis  2. Other specified hypothyroidism - levothyroxine (SYNTHROID, LEVOTHROID) 75 MCG tablet; Take one tablet by mouth once daily for thyroid  Dispense: 90 tablet; Refill: 2 - TSH; Future  3. Essential hypertension, benign - Comprehensive metabolic panel; Future  4. ALZHEIMERS DISEASE Continue current medication  5. BPH (benign prostatic hypertrophy) with urinary obstruction - Ambulatory referral to Urology  6. Mixed hyperlipidemia Continue lovastatin  7. Chronic insomnia Continue zolpidem. Encourage patient to leave off alcohol prior to bed.

## 2014-11-30 ENCOUNTER — Other Ambulatory Visit: Payer: Self-pay | Admitting: Internal Medicine

## 2014-12-23 ENCOUNTER — Other Ambulatory Visit: Payer: Self-pay | Admitting: Internal Medicine

## 2014-12-29 ENCOUNTER — Ambulatory Visit (INDEPENDENT_AMBULATORY_CARE_PROVIDER_SITE_OTHER): Payer: Medicare Other | Admitting: Nurse Practitioner

## 2014-12-29 ENCOUNTER — Encounter: Payer: Self-pay | Admitting: Nurse Practitioner

## 2014-12-29 VITALS — BP 136/88 | HR 80 | Temp 98.3°F | Ht 71.0 in | Wt 173.0 lb

## 2014-12-29 DIAGNOSIS — R110 Nausea: Secondary | ICD-10-CM

## 2014-12-29 DIAGNOSIS — R5381 Other malaise: Secondary | ICD-10-CM | POA: Diagnosis not present

## 2014-12-29 DIAGNOSIS — R5383 Other fatigue: Secondary | ICD-10-CM | POA: Diagnosis not present

## 2014-12-29 LAB — POCT URINALYSIS DIPSTICK
BILIRUBIN UA: POSITIVE
Blood, UA: NEGATIVE
Glucose, UA: NEGATIVE
Nitrite, UA: NEGATIVE
PH UA: 6
Spec Grav, UA: 1.025
Urobilinogen, UA: 0.2

## 2014-12-29 MED ORDER — ONDANSETRON HCL 4 MG PO TABS: 4.0000 mg | ORAL_TABLET | Freq: Three times a day (TID) | ORAL | Status: AC | PRN

## 2014-12-29 NOTE — Progress Notes (Signed)
Patient ID: Phillip Larson, male   DOB: [birth year], 80 y.o.   MRN: [id number]    PCP: Estill Dooms, MD  Allergies  Allergen Reactions  . Iodine Swelling    Internal iodine  . Iohexol Hives         Chief Complaint  Patient presents with  . Acute Visit     No energy (sleeping well at night), decreased appetite, stomach discomfort (diarrhea), and weak 4-5 days      HPI: Patient is a 80 y.o. male seen in the office today due to weakness. Has been feeling bad for several days. Very weak. Stomach queasy, nauseous, feeling bloated, no vomiting.  No energy, no get up and go, very tired. Going on for 3-4 days. Frequent bowel movements a few times a day. No abdominal pain. Decreased appetite. Has not been eating.  Has not been drinking much.  No recent antibiotic use.      Review of Systems:  Review of Systems  Constitutional: Positive for activity change, appetite change and fatigue. Negative for fever and chills.  HENT: Negative for congestion.   Eyes: Negative.   Respiratory: Negative for cough and shortness of breath.   Cardiovascular: Negative for chest pain, palpitations and leg swelling.  Gastrointestinal: Positive for nausea and abdominal distention. Negative for vomiting, abdominal pain and constipation.  Genitourinary: Negative for dysuria and difficulty urinating.  Musculoskeletal: Negative for myalgias and arthralgias.  Skin: Negative for color change and wound.  Neurological: Positive for weakness and light-headedness. Negative for dizziness and headaches.  Psychiatric/Behavioral: Positive for confusion.    Past Medical History  Diagnosis Date  . Mixed hyperlipidemia   . ALZHEIMERS DISEASE   . Essential hypertension, benign   . CAD   . BENIGN PROSTATIC HYPERTROPHY, HX OF   . Impacted cerumen   . Anxiety state, unspecified   . Pressure ulcer   . Actinic keratosis   . Enthesopathy of hip region   . Unspecified hereditary and idiopathic peripheral neuropathy     . Benign neoplasm of colon   . Unspecified vitamin D deficiency   . Other premature beats   . Diverticulosis of colon (without mention of hemorrhage)   . Unspecified hypothyroidism   . Coronary atherosclerosis of native coronary artery   . Urinary obstruction, unspecified   . Actinic keratosis   . Pain in joint, site unspecified   . Osteoarthrosis, unspecified whether generalized or localized, unspecified site   . Peptic ulcer, unspecified site, unspecified as acute or chronic, without mention of hemorrhage, perforation, or obstruction   . Other and unspecified hyperlipidemia   . Alzheimer's disease   . Myalgia and myositis, unspecified   . Insomnia, unspecified   . Other malaise and fatigue   . External hemorrhoids without mention of complication   . Anal fissure   . Other psoriasis   . Unspecified essential hypertension   . Depressive disorder, not elsewhere classified   . Cerebrovascular disease, unspecified   . Acute, but ill-defined, cerebrovascular disease   . Reflux esophagitis   . Pain in joint, lower leg   . Palpitations   . Shortness of breath   . Coronary atherosclerosis of native coronary artery   . Impacted cerumen [id number]  . Pressure ulcer of buttock, unstageable   . Unspecified hereditary and idiopathic peripheral neuropathy   . Urinary obstruction, unspecified   . Actinic keratosis   . Osteoarthrosis, unspecified whether generalized or localized, unspecified site   . Peptic ulcer, unspecified site, unspecified  as acute or chronic, without mention of hemorrhage, perforation, or obstruction   . Myalgia and myositis, unspecified   . Insomnia, unspecified   . Other malaise and fatigue   . External hemorrhoids without mention of complication   . Anal fissure   . Depressive disorder, not elsewhere classified   . Cerebrovascular disease, unspecified   . Acute, but ill-defined, cerebrovascular disease   . Palpitations    Past Surgical History  Procedure  Laterality Date  . Knee surgery  07/18/2006    Dr French Ana   . Cervical disc surgery    . Laminectomy c3 disc  1972  . Retinal tear repair cryotherapy      Dr Wayne Sever   . Multiple tooth extractions  2014  . Eye surgery Left 02/07/2014    Cataract Surgery Left Eye  . Left heart catheterization with coronary angiogram N/A 01/28/2013    Procedure: LEFT HEART CATHETERIZATION WITH CORONARY ANGIOGRAM;  Surgeon: Josue Hector, MD;  Location: [facility] CATH LAB;  Service: Cardiovascular;  Laterality: N/A;   Social History:   reports that he quit smoking about 22 years ago. His smoking use included Cigarettes. He has a 50 pack-year smoking history. He has never used smokeless tobacco. He reports that he drinks alcohol. He reports that he does not use illicit drugs.  Family History  Problem Relation Age of Onset  . Cancer Father     pancreatic  . Cancer Mother     pancreatic    Medications: Patient's Medications  New Prescriptions   No medications on file  Previous Medications   ALPRAZOLAM (XANAX) 0.5 MG TABLET    Take 1 tablet (0.5 mg total) by mouth 2 (two) times daily as needed. for anxiety   ASPIRIN EC 81 MG TABLET    Take 81 mg by mouth at bedtime.   B COMPLEX VITAMINS TABLET    Take 1 tablet by mouth daily.   CHOLECALCIFEROL (VITAMIN D) 1000 UNITS TABLET    Take 1,000 Units by mouth daily.   DILTIAZEM (CARDIZEM CD) 240 MG 24 HR CAPSULE    Take 1 capsule (240 mg total) by mouth daily.   DOCUSATE SODIUM (COLACE) 100 MG CAPSULE    Take 100 mg by mouth daily.    FINASTERIDE (PROSCAR) 5 MG TABLET    Take 1 tablet (5 mg total) by mouth daily. For prostate   GALANTAMINE (RAZADYNE ER) 24 MG 24 HR CAPSULE    TAKE ONE CAPSULE EVERY DAY WITH BREAKFAST   ISOSORBIDE MONONITRATE (IMDUR) 30 MG 24 HR TABLET    1 EVERY DAY   LEVOTHYROXINE (SYNTHROID, LEVOTHROID) 75 MCG TABLET    Take one tablet by mouth once daily for thyroid   LORATADINE (CLARITIN) 10 MG TABLET    Take 10 mg by mouth daily.   LOVASTATIN  (MEVACOR) 40 MG TABLET    TAKE ONE TABLET BY MOUTH AT BEDTIME FOR  CHOLESTEROL   MEMANTINE (NAMENDA XR) 28 MG CP24 24 HR CAPSULE    Take 1 capsule (28 mg total) by mouth daily.   MULTIPLE VITAMIN (MULTIVITAMIN WITH MINERALS) TABS TABLET    Take 1 tablet by mouth daily.   NITROGLYCERIN (NITROSTAT) 0.4 MG SL TABLET    One under the tongue if needed for chest tightness. Repeat in 5 minutes if needed. Repeat again in 5 minutes if needed.   OMEPRAZOLE (PRILOSEC) 40 MG CAPSULE    Take 40 mg by mouth daily.   SERTRALINE (ZOLOFT) 25 MG TABLET  Take 1 tablet (25 mg total) by mouth daily.   TAMSULOSIN (FLOMAX) 0.4 MG CAPS CAPSULE    Take one capsule by mouth once daily to help relieve obstruction   TRAZODONE (DESYREL) 100 MG TABLET    Take one tablet by mouth once at bedtime to help rest   ZINC GLUCONATE 50 MG TABLET    Take 50 mg by mouth at bedtime.   ZOLPIDEM (AMBIEN) 10 MG TABLET    TAKE ONE TABLET BY MOUTH AT BEDTIME FOR  REST  Modified Medications   No medications on file  Discontinued Medications   CEFUROXIME (CEFTIN) 250 MG TABLET    Take 1 tablet (250 mg total) by mouth 2 (two) times daily with a meal.   LOTEMAX 0.5 % GEL    as needed.     Physical Exam:  Filed Vitals:   12/29/14 1526  BP: 136/88  Pulse: 80  Temp: 98.3 F (36.8 C)  TempSrc: Oral  Height: 5\' 11"  (1.803 m)  Weight: 173 lb (78.472 kg)  SpO2: 97%    Physical Exam  Constitutional: He is oriented to person, place, and time. He appears well-developed and well-nourished.  HENT:  Head: Normocephalic and atraumatic.  Nose: Right sinus exhibits no frontal sinus tenderness. Left sinus exhibits no frontal sinus tenderness.  Mouth/Throat: Oropharynx is clear and moist and mucous membranes are normal. No oropharyngeal exudate.  Eyes: Conjunctivae and EOM are normal. Pupils are equal, round, and reactive to light. No scleral icterus.  Neck: Normal range of motion. Neck supple. No thyromegaly present.  Cardiovascular: Normal  rate and regular rhythm.   Murmur heard. no distal LE swelling  Pulmonary/Chest: Effort normal. He has no rhonchi.  Abdominal: Soft. Bowel sounds are normal. He exhibits no distension, no abdominal bruit, no pulsatile midline mass and no mass. There is no tenderness. There is no rebound, no guarding and no CVA tenderness.  Musculoskeletal: He exhibits no edema.  Lymphadenopathy:    He has no cervical adenopathy.  Neurological: He is alert and oriented to person, place, and time. He has normal reflexes.  Skin: Skin is warm and dry. No rash noted.  Psychiatric: His behavior is normal. Judgment and thought content normal. His affect is blunt. He exhibits a depressed mood.    Labs reviewed: Basic Metabolic Panel:  Recent Labs  02/15/14 1151  NA 141  K 4.2  CL 100  CO2 24  GLUCOSE 103*  BUN 12  CREATININE 1.40*  CALCIUM 9.7  TSH 2.740   Liver Function Tests:  Recent Labs  02/15/14 1151  AST 19  ALT 21  ALKPHOS 116  BILITOT 0.6  PROT 6.4   No results for input(s): LIPASE, AMYLASE in the last 8760 hours. No results for input(s): AMMONIA in the last 8760 hours. CBC:  Recent Labs  02/15/14 1151  WBC 5.3  NEUTROABS 3.3  HGB 15.1  HCT 45.1  MCV 93   Lipid Panel: No results for input(s): CHOL, HDL, LDLCALC, TRIG, CHOLHDL, LDLDIRECT in the last 8760 hours. TSH:  Recent Labs  02/15/14 1151  TSH 2.740   A1C: No results found for: HGBA1C   Assessment/Plan 1. Malaise and fatigue -worsening fatigue and malaise over the last 4-5 days, may be viral illness due to nausea. Will check labs to follow up hgb, wbc, electrolytes, kidney function, liver, and thyroid -will check UA, if abnormal will send for culture.  - CBC with Differential - Comprehensive metabolic panel - TSH - POC Urinalysis  2. Nausea  without vomiting -bland diet and advanced as tolerated -increase fluids  - ondansetron (ZOFRAN) 4 MG tablet; Take 1 tablet (4 mg total) by mouth every 8 (eight)  hours as needed for nausea or vomiting.  Dispense: 20 tablet; Refill: 0  Return and follow up precautions discussed.  Carlos American. Harle Battiest  [facility] [phone] 8 am - 5 pm) [phone] (after hours)

## 2014-12-29 NOTE — Addendum Note (Signed)
Addended by: Logan Bores on: 12/29/2014 04:35 PM   Modules accepted: Orders

## 2014-12-29 NOTE — Patient Instructions (Signed)
Hold off on colace Increase fluid  Bland diet advanced as tolerated  Will get labs today and check a urine sample  May use zofran as needed for nausea

## 2014-12-30 LAB — COMPREHENSIVE METABOLIC PANEL
A/G RATIO: 2.4 (ref 1.1–2.5)
ALBUMIN: 4.8 g/dL (ref 3.5–4.8)
ALT: 26 IU/L (ref 0–44)
AST: 30 IU/L (ref 0–40)
Alkaline Phosphatase: 105 IU/L (ref 39–117)
BILIRUBIN TOTAL: 0.9 mg/dL (ref 0.0–1.2)
BUN / CREAT RATIO: 9 — AB (ref 10–22)
BUN: 11 mg/dL (ref 8–27)
CO2: 23 mmol/L (ref 18–29)
Calcium: 9.8 mg/dL (ref 8.6–10.2)
Chloride: 95 mmol/L — ABNORMAL LOW (ref 97–108)
Creatinine, Ser: 1.25 mg/dL (ref 0.76–1.27)
GFR calc non Af Amer: 55 mL/min/{1.73_m2} — ABNORMAL LOW (ref >59)
GFR, EST AFRICAN AMERICAN: 63 mL/min/{1.73_m2} (ref >59)
Globulin, Total: 2 g/dL (ref 1.5–4.5)
Glucose: 101 mg/dL — ABNORMAL HIGH (ref 65–99)
POTASSIUM: 4.7 mmol/L (ref 3.5–5.2)
Sodium: 138 mmol/L (ref 134–144)
TOTAL PROTEIN: 6.8 g/dL (ref 6.0–8.5)

## 2014-12-30 LAB — CBC WITH DIFFERENTIAL/PLATELET
Basophils Absolute: 0 10*3/uL (ref 0.0–0.2)
Basos: 0 %
EOS (ABSOLUTE): 0.2 10*3/uL (ref 0.0–0.4)
Eos: 3 %
HEMOGLOBIN: 14.7 g/dL (ref 12.6–17.7)
Hematocrit: 44.1 % (ref 37.5–51.0)
IMMATURE GRANS (ABS): 0 10*3/uL (ref 0.0–0.1)
Immature Granulocytes: 0 %
LYMPHS: 18 %
Lymphocytes Absolute: 1.3 10*3/uL (ref 0.7–3.1)
MCH: 31.1 pg (ref 26.6–33.0)
MCHC: 33.3 g/dL (ref 31.5–35.7)
MCV: 93 fL (ref 79–97)
MONOCYTES: 7 %
Monocytes Absolute: 0.5 10*3/uL (ref 0.1–0.9)
NEUTROS ABS: 5.1 10*3/uL (ref 1.4–7.0)
Neutrophils: 72 %
Platelets: 214 10*3/uL (ref 150–379)
RBC: 4.72 x10E6/uL (ref 4.14–5.80)
RDW: 13.6 % (ref 12.3–15.4)
WBC: 7.1 10*3/uL (ref 3.4–10.8)

## 2014-12-30 LAB — TSH: TSH: 2.03 u[IU]/mL (ref 0.450–4.500)

## 2014-12-31 LAB — URINE CULTURE: Organism ID, Bacteria: NO GROWTH

## 2015-01-11 ENCOUNTER — Other Ambulatory Visit: Payer: Self-pay | Admitting: *Deleted

## 2015-01-11 MED ORDER — SERTRALINE HCL 50 MG PO TABS: 50.0000 mg | ORAL_TABLET | Freq: Every day | ORAL | Status: DC

## 2015-01-11 NOTE — Telephone Encounter (Signed)
Changed medication due to labwork.

## 2015-01-13 DIAGNOSIS — N5201 Erectile dysfunction due to arterial insufficiency: Secondary | ICD-10-CM | POA: Diagnosis not present

## 2015-01-13 DIAGNOSIS — R3915 Urgency of urination: Secondary | ICD-10-CM | POA: Diagnosis not present

## 2015-01-13 DIAGNOSIS — N3941 Urge incontinence: Secondary | ICD-10-CM | POA: Diagnosis not present

## 2015-01-14 ENCOUNTER — Other Ambulatory Visit: Payer: Self-pay | Admitting: Internal Medicine

## 2015-01-20 ENCOUNTER — Other Ambulatory Visit: Payer: Self-pay | Admitting: Internal Medicine

## 2015-01-23 ENCOUNTER — Other Ambulatory Visit: Payer: Self-pay | Admitting: Internal Medicine

## 2015-01-27 ENCOUNTER — Other Ambulatory Visit: Payer: Self-pay | Admitting: Internal Medicine

## 2015-01-31 ENCOUNTER — Other Ambulatory Visit: Payer: Self-pay | Admitting: Internal Medicine

## 2015-02-22 ENCOUNTER — Other Ambulatory Visit: Payer: Self-pay | Admitting: Internal Medicine

## 2015-02-23 DIAGNOSIS — Z961 Presence of intraocular lens: Secondary | ICD-10-CM | POA: Diagnosis not present

## 2015-02-23 DIAGNOSIS — H40013 Open angle with borderline findings, low risk, bilateral: Secondary | ICD-10-CM | POA: Diagnosis not present

## 2015-02-23 DIAGNOSIS — H26491 Other secondary cataract, right eye: Secondary | ICD-10-CM | POA: Diagnosis not present

## 2015-02-28 ENCOUNTER — Other Ambulatory Visit: Payer: Self-pay | Admitting: Internal Medicine

## 2015-03-20 ENCOUNTER — Other Ambulatory Visit: Payer: Self-pay | Admitting: Internal Medicine

## 2015-04-04 ENCOUNTER — Other Ambulatory Visit: Payer: Self-pay | Admitting: Cardiovascular Disease

## 2015-04-18 ENCOUNTER — Other Ambulatory Visit: Payer: Self-pay | Admitting: Internal Medicine

## 2015-05-11 DIAGNOSIS — M1711 Unilateral primary osteoarthritis, right knee: Secondary | ICD-10-CM | POA: Diagnosis not present

## 2015-05-17 ENCOUNTER — Other Ambulatory Visit: Payer: Self-pay | Admitting: Internal Medicine

## 2015-05-24 ENCOUNTER — Encounter: Payer: Self-pay | Admitting: Internal Medicine

## 2015-05-24 ENCOUNTER — Ambulatory Visit (INDEPENDENT_AMBULATORY_CARE_PROVIDER_SITE_OTHER): Payer: Medicare Other | Admitting: Internal Medicine

## 2015-05-24 VITALS — BP 92/68 | HR 65 | Temp 97.8°F | Resp 20 | Ht 71.0 in | Wt 176.4 lb

## 2015-05-24 DIAGNOSIS — F329 Major depressive disorder, single episode, unspecified: Secondary | ICD-10-CM

## 2015-05-24 DIAGNOSIS — I2510 Atherosclerotic heart disease of native coronary artery without angina pectoris: Secondary | ICD-10-CM

## 2015-05-24 DIAGNOSIS — F0280 Dementia in other diseases classified elsewhere without behavioral disturbance: Secondary | ICD-10-CM

## 2015-05-24 DIAGNOSIS — G4700 Insomnia, unspecified: Secondary | ICD-10-CM

## 2015-05-24 DIAGNOSIS — R972 Elevated prostate specific antigen [PSA]: Secondary | ICD-10-CM | POA: Diagnosis not present

## 2015-05-24 DIAGNOSIS — I208 Other forms of angina pectoris: Secondary | ICD-10-CM | POA: Diagnosis not present

## 2015-05-24 DIAGNOSIS — I10 Essential (primary) hypertension: Secondary | ICD-10-CM

## 2015-05-24 DIAGNOSIS — E782 Mixed hyperlipidemia: Secondary | ICD-10-CM

## 2015-05-24 DIAGNOSIS — F5104 Psychophysiologic insomnia: Secondary | ICD-10-CM

## 2015-05-24 DIAGNOSIS — F32A Depression, unspecified: Secondary | ICD-10-CM

## 2015-05-24 DIAGNOSIS — N138 Other obstructive and reflux uropathy: Secondary | ICD-10-CM

## 2015-05-24 DIAGNOSIS — N401 Enlarged prostate with lower urinary tract symptoms: Secondary | ICD-10-CM | POA: Diagnosis not present

## 2015-05-24 DIAGNOSIS — E039 Hypothyroidism, unspecified: Secondary | ICD-10-CM

## 2015-05-24 DIAGNOSIS — N400 Enlarged prostate without lower urinary tract symptoms: Secondary | ICD-10-CM

## 2015-05-24 DIAGNOSIS — Z23 Encounter for immunization: Secondary | ICD-10-CM | POA: Diagnosis not present

## 2015-05-24 DIAGNOSIS — G301 Alzheimer's disease with late onset: Secondary | ICD-10-CM | POA: Diagnosis not present

## 2015-05-24 MED ORDER — SERTRALINE HCL 50 MG PO TABS: 50.0000 mg | ORAL_TABLET | Freq: Every day | ORAL | Status: DC

## 2015-05-24 MED ORDER — NITROGLYCERIN 0.4 MG SL SUBL: SUBLINGUAL_TABLET | SUBLINGUAL | Status: AC

## 2015-05-24 MED ORDER — TRAZODONE HCL 100 MG PO TABS: ORAL_TABLET | ORAL | Status: DC

## 2015-05-24 NOTE — Progress Notes (Signed)
Patient ID: Phillip Larson, male   DOB: 1934-05-29, 81 y.o.   MRN: [id number]    Facility  [facility]    Place of Service:   OFFICE    Allergies  Allergen Reactions  . Iodine Swelling    Internal iodine  . Iohexol Hives         Chief Complaint  Patient presents with  . Medical Management of Chronic Issues    Routine follow-up visit for Hypertension, Hypothyroidism  . OTHER    Wife in room with patient,Discuss Flu shot    HPI:  Bone spur in the right knee per Dr. French Ana. Given steroid injection and wears brace when playing tennis.  Mood improved on sertraline. Doom and gloom sleep.  Alcohol intake, gin,  before bed. Wine x 2 glasses in the evening.   Has not received flu vaccine.  Angina decubitus ([place]) - although he denies having any angina, he is requesting a refill of nitroGLYCERIN (NITROSTAT) 0.4 MG SL tablet  Essential hypertension  - Controlled  Mixed hyperlipidemia - needs lab follow-up next visit   BPH (benign prostatic hypertrophy) with urinary obstruction - urologist discontinued Proscar, but he continues on Flomax  Late onset Alzheimer's disease without behavioral disturbance - atypical course of dementia with over 10 years behind him now. Patient had area of tiny stroke on a prior brain scan.  Chronic insomnia - he feels he continues to benefit from traZODone (DESYREL) 100 MG tablet  Atherosclerosis of native coronary artery of native heart without angina pectoris -stable and without angina  Hypothyroidism, unspecified hypothyroidism type - needs follow-up lab next visit  BPH with elevated PSA - follow-up next visit  Depression - improved  on sertraline (ZOLOFT) 50 MG tablet  Encounter for immunization    Medications: Patient's Medications  New Prescriptions   No medications on file  Previous Medications   ALPRAZOLAM (XANAX) 0.5 MG TABLET    TAKE ONE TABLET BY MOUTH TWICE DAILY AS NEEDED FOR ANXIETY   ASPIRIN EC 81 MG TABLET    Take 81 mg by mouth  at bedtime.   B COMPLEX VITAMINS TABLET    Take 1 tablet by mouth daily.   CHOLECALCIFEROL (VITAMIN D) 1000 UNITS TABLET    Take 1,000 Units by mouth daily.   DILTIAZEM (CARDIZEM CD) 240 MG 24 HR CAPSULE    Take 1 capsule (240 mg total) by mouth daily.   DOCUSATE SODIUM (COLACE) 100 MG CAPSULE    Take 100 mg by mouth daily.    FINASTERIDE (PROSCAR) 5 MG TABLET    Take 1 tablet (5 mg total) by mouth daily. For prostate   GALANTAMINE (RAZADYNE ER) 24 MG 24 HR CAPSULE    TAKE ONE CAPSULE EVERY DAY WITH BREAKFAST   ISOSORBIDE MONONITRATE (IMDUR) 30 MG 24 HR TABLET    TAKE ONE TABLET BY MOUTH ONCE DAILY   LEVOTHYROXINE (SYNTHROID, LEVOTHROID) 75 MCG TABLET    Take one tablet by mouth once daily for thyroid   LORATADINE (CLARITIN) 10 MG TABLET    Take 10 mg by mouth daily.   LOVASTATIN (MEVACOR) 40 MG TABLET    TAKE ONE TABLET BY MOUTH AT BEDTIME FOR CHOLESTEROL   MULTIPLE VITAMIN (MULTIVITAMIN WITH MINERALS) TABS TABLET    Take 1 tablet by mouth daily.   NAMENDA XR 28 MG CP24 24 HR CAPSULE    TAKE ONE CAPSULE BY MOUTH ONCE DAILY   NITROGLYCERIN (NITROSTAT) 0.4 MG SL TABLET    One under the tongue if  needed for chest tightness. Repeat in 5 minutes if needed. Repeat again in 5 minutes if needed.   SERTRALINE (ZOLOFT) 50 MG TABLET    Take 1 tablet (50 mg total) by mouth daily.   TAMSULOSIN (FLOMAX) 0.4 MG CAPS CAPSULE    Take one capsule by mouth once daily to help relieve obstruction   TRAZODONE (DESYREL) 100 MG TABLET    Take one tablet by mouth once at bedtime to help rest   ZINC GLUCONATE 50 MG TABLET    Take 50 mg by mouth at bedtime.   ZOLPIDEM (AMBIEN) 10 MG TABLET    TAKE ONE TABLET BY MOUTH AT BEDTIME AS NEEDED FOR SLEEP  Modified Medications   No medications on file  Discontinued Medications   OMEPRAZOLE (PRILOSEC) 40 MG CAPSULE    Take 40 mg by mouth daily.    Review of Systems  Constitutional: Negative for fever, chills, activity change, appetite change and fatigue.  HENT: Negative for  congestion.   Eyes: Negative.   Respiratory: Negative for cough and shortness of breath.   Cardiovascular: Negative for chest pain, palpitations and leg swelling.  Gastrointestinal: Negative for nausea, vomiting, abdominal pain, constipation and abdominal distention.  Genitourinary: Negative for dysuria and difficulty urinating.  Musculoskeletal: Negative for myalgias and arthralgias.  Skin: Negative for color change and wound.  Neurological: Positive for weakness. Negative for dizziness, light-headedness and headaches.  Psychiatric/Behavioral: Positive for confusion.    Filed Vitals:   05/24/15 1603  BP: 92/68  Pulse: 65  Temp: 97.8 F (36.6 C)  TempSrc: Oral  Resp: 20  Height: _0  (1.803 m)  Weight: 176 lb 6.4 oz (80.015 kg)  SpO2: 93%   Body mass index is 24.61 kg/(m^2). Filed Weights   05/24/15 1603  Weight: 176 lb 6.4 oz (80.015 kg)     Physical Exam  Constitutional: He is oriented to person, place, and time. He appears well-developed and well-nourished.  HENT:  Head: Normocephalic and atraumatic.  Nose: Right sinus exhibits no frontal sinus tenderness. Left sinus exhibits no frontal sinus tenderness.  Mouth/Throat: Oropharynx is clear and moist and mucous membranes are normal. No oropharyngeal exudate.  Eyes: Conjunctivae and EOM are normal. Pupils are equal, round, and reactive to light. No scleral icterus.  Neck: Normal range of motion. Neck supple. No thyromegaly present.  Cardiovascular: Normal rate and regular rhythm.   Murmur heard. no distal LE swelling  Pulmonary/Chest: Effort normal. He has no rhonchi.  Abdominal: Soft. Bowel sounds are normal. He exhibits no distension, no abdominal bruit, no pulsatile midline mass and no mass. There is no tenderness. There is no rebound, no guarding and no CVA tenderness.  Musculoskeletal: He exhibits no edema.  Lymphadenopathy:    He has no cervical adenopathy.  Neurological: He is alert and oriented to person,  place, and time. He has normal reflexes.  Skin: Skin is warm and dry. No rash noted.  Psychiatric: His behavior is normal. Judgment and thought content normal. His affect is not blunt. He does not exhibit a depressed mood.    Labs reviewed: Lab Summary Latest Ref Rng 12/29/2014 02/15/2014  Hemoglobin 12.6 - 17.7 g/dL 14.7 15.1  Hematocrit 37.5 - 51.0 % 44.1 45.1  White count 3.4 - 10.8 x10E3/uL 7.1 5.3  Platelet count 150 - 379 x10E3/uL 214 (None)  Sodium 134 - 144 mmol/L 138 141  Potassium 3.5 - 5.2 mmol/L 4.7 4.2  Calcium 8.6 - 10.2 mg/dL 9.8 9.7  Phosphorus - (None) (None)  Creatinine  0.76 - 1.27 mg/dL 1.25 1.40(H)  AST 0 - 40 IU/L 30 19  Alk Phos 39 - 117 IU/L 105 116  Bilirubin 0.0 - 1.2 mg/dL 0.9 0.6  Glucose 65 - 99 mg/dL 101(H) 103(H)  Cholesterol - (None) (None)  HDL cholesterol - (None) (None)  Triglycerides - (None) (None)  LDL Direct - (None) (None)  LDL Calc - (None) (None)  Total protein - (None) (None)  Albumin 3.5 - 4.8 g/dL 4.8 4.5   Lab Results  Component Value Date   TSH 2.030 12/29/2014   TSH 2.740 02/15/2014   TSH 2.630 11/25/2013   Lab Results  Component Value Date   BUN 11 12/29/2014   BUN 12 02/15/2014   BUN 14 11/25/2013   No results found for: HGBA1C  Assessment/Plan  1. Essential hypertension, benign - Comprehensive metabolic panel; Future  2. Mixed hyperlipidemia - Lipid panel; Future  3. BPH (benign prostatic hypertrophy) with urinary obstruction Continue Flomax  4. Late onset Alzheimer's disease without behavioral disturbance Continue current medication  5. Chronic insomnia - traZODone (DESYREL) 100 MG tablet; Take one tablet by mouth once at bedtime to help rest  Dispense: 90 tablet; Refill: 1  6. Angina decubitus ([facility]) - nitroGLYCERIN (NITROSTAT) 0.4 MG SL tablet; One under the tongue if needed for chest tightness. Repeat in 5 minutes if needed. Repeat again in 5 minutes if needed.  Dispense: 25 tablet; Refill: 12  7.  Atherosclerosis of native coronary artery of native heart without angina pectoris Stable  8. Hypothyroidism, unspecified hypothyroidism type - TSH; Future  9. BPH with elevated PSA - PSA; Future  10. Depression - sertraline (ZOLOFT) 50 MG tablet; Take 1 tablet (50 mg total) by mouth daily.  Dispense: 90 tablet; Refill: 3  11. Encounter for immunization Patient was given flu vaccine

## 2015-06-15 ENCOUNTER — Other Ambulatory Visit: Payer: Self-pay | Admitting: Internal Medicine

## 2015-07-10 IMAGING — CR DG CHEST 2V
1 series · 1 of 1 positions shown · non-contrast
Comparison: 03/13/2012

CLINICAL DATA: Respiratory distress and chest pain

CHEST - 2 VIEW

[w chest lat]
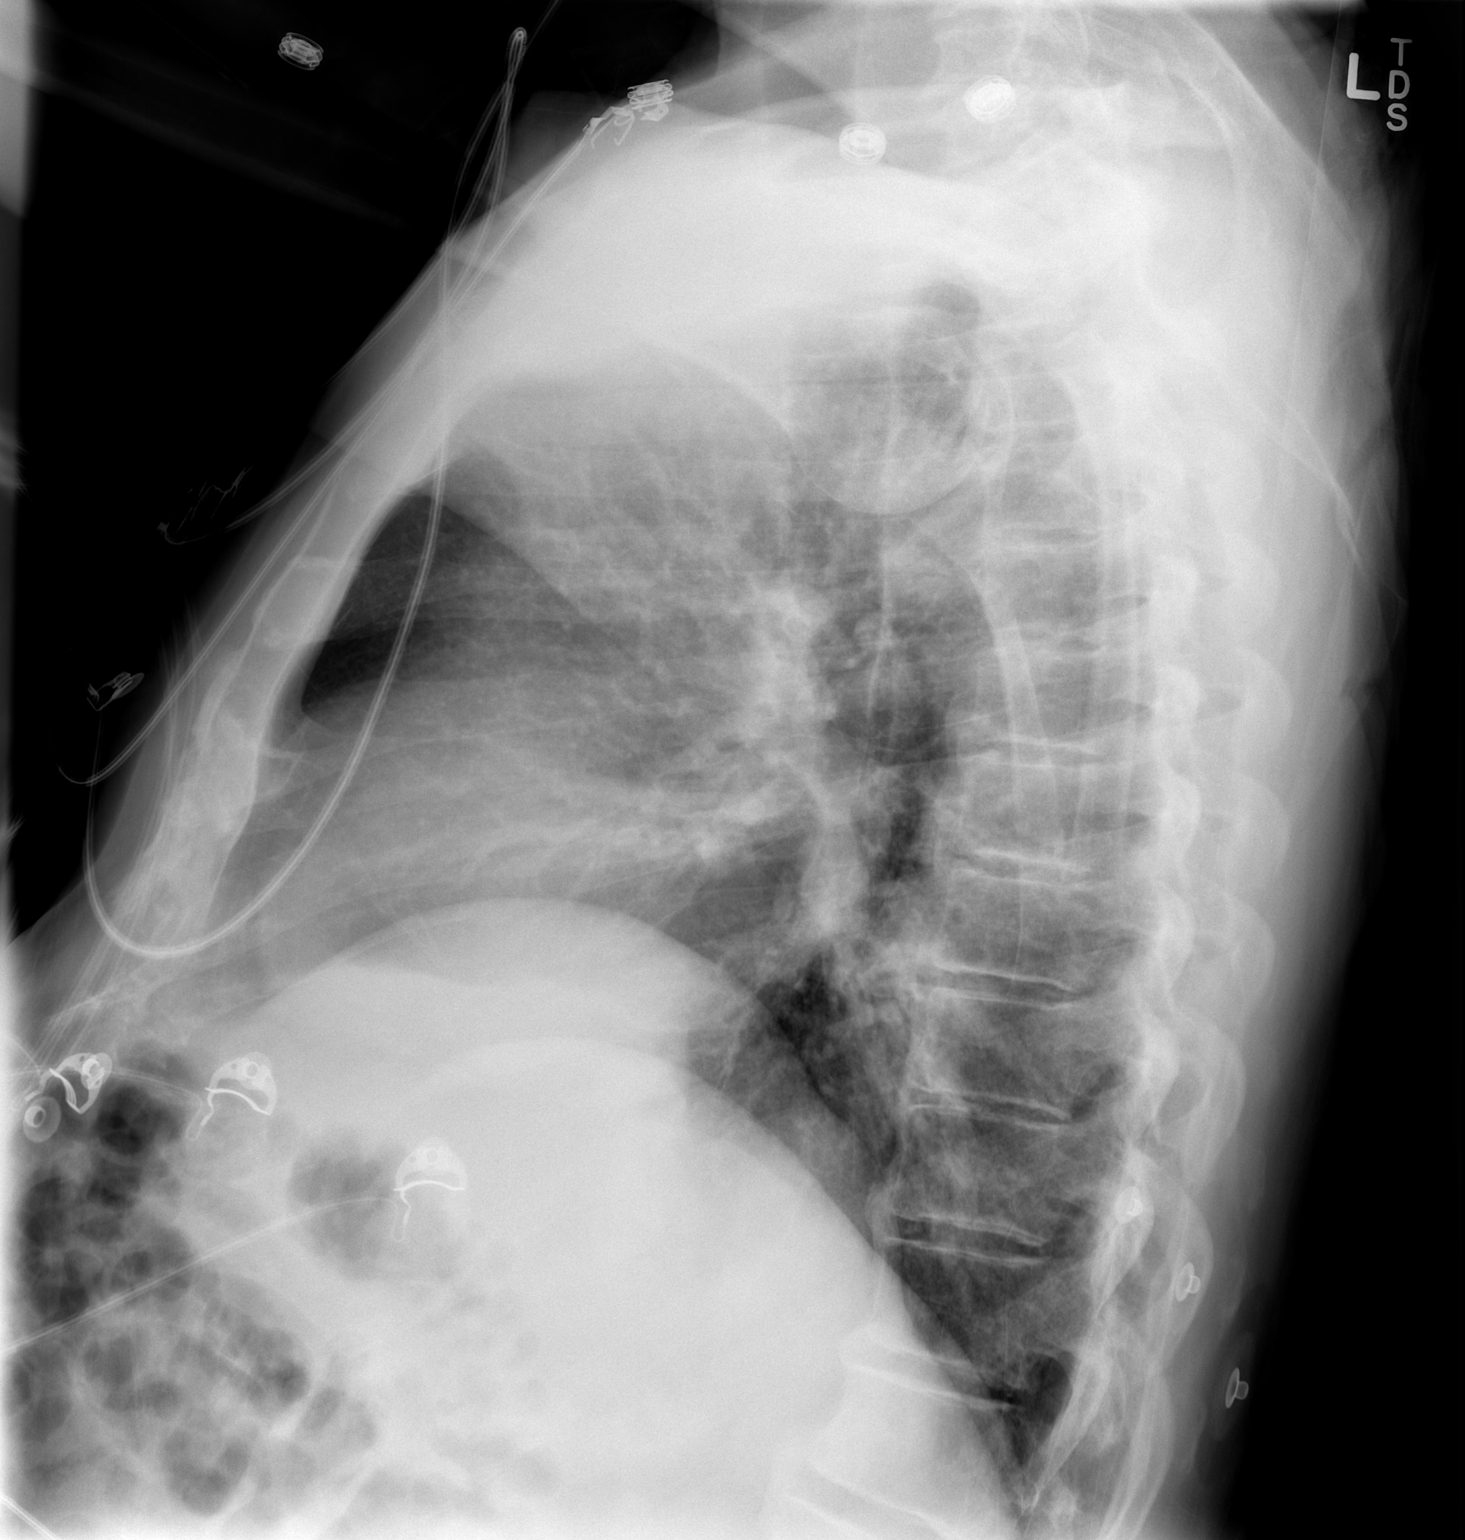

[1 of 1 positions shown; findings below may reference images not displayed]

FINDINGS: Cardiac silhouette is normal in size and configuration.
The aorta is mildly uncoiled.  The lungs are clear.  No pleural
effusion or pneumothorax.

The bony thorax is demineralized but intact.
IMPRESSION: No acute cardiopulmonary disease.

## 2015-07-14 ENCOUNTER — Other Ambulatory Visit: Payer: Self-pay | Admitting: Nurse Practitioner

## 2015-08-02 DIAGNOSIS — R3915 Urgency of urination: Secondary | ICD-10-CM | POA: Diagnosis not present

## 2015-08-02 DIAGNOSIS — N3941 Urge incontinence: Secondary | ICD-10-CM | POA: Diagnosis not present

## 2015-08-02 DIAGNOSIS — Z0000 Encounter for general adult medical examination without abnormal findings: Secondary | ICD-10-CM | POA: Diagnosis not present

## 2015-08-15 ENCOUNTER — Other Ambulatory Visit: Payer: Self-pay | Admitting: Internal Medicine

## 2015-09-06 ENCOUNTER — Other Ambulatory Visit: Payer: Self-pay | Admitting: Internal Medicine

## 2015-09-06 DIAGNOSIS — Z0000 Encounter for general adult medical examination without abnormal findings: Secondary | ICD-10-CM | POA: Diagnosis not present

## 2015-09-06 DIAGNOSIS — R3915 Urgency of urination: Secondary | ICD-10-CM | POA: Diagnosis not present

## 2015-09-06 DIAGNOSIS — N3941 Urge incontinence: Secondary | ICD-10-CM | POA: Diagnosis not present

## 2015-09-13 NOTE — Progress Notes (Signed)
Patient ID: Phillip Larson, male   DOB: 1934/09/15, 81 y.o.   MRN: [id number]   Phillip Larson is seen today for followup of his blood pressure and CAD .[name] He has been more active. He is watching the salt in his diet. He also has hypercholesterolemia  He has CAD with a total RCA that is collaterized. He had a low risk myovue in 2009 .   Cath done 01/28/13 from right radial approach and no changes with chronically occluded RCA , robust collaterals and no significant left sided disease   Playing tennis 3x/week no chest pain needs refills on cardizem and imdur.   Has a "catch" under right ribs  Chronic Saw ? GI doctor and no GB disease  Primary checks chol/ thyroid they have been good    ROS: Denies fever, malais, weight loss, blurry vision, decreased visual acuity, cough, sputum, SOB, hemoptysis, pleuritic pain, palpitaitons, heartburn, abdominal pain, melena, lower extremity edema, claudication, or rash.  All other systems reviewed and negative  General: Affect appropriate Healthy:  appears stated age 81: normal Neck supple with no adenopathy JVP normal no bruits no thyromegaly Lungs clear with no wheezing and good diaphragmatic motion Heart:  S1/S2 no murmur, no rub, gallop or click PMI normal Abdomen: benighn, BS positve, no tenderness, no AAA no bruit.  No HSM or HJR Distal pulses intact with no bruits No edema Neuro non-focal Skin warm and dry No muscular weakness   Current Outpatient Prescriptions  Medication Sig Dispense Refill  . ALPRAZolam (XANAX) 0.5 MG tablet TAKE ONE TABLET BY MOUTH TWICE DAILY AS NEEDED FOR ANXIETY 60 tablet 0  . aspirin EC 81 MG tablet Take 81 mg by mouth at bedtime.    [name] b complex vitamins tablet Take 1 tablet by mouth daily.    . cholecalciferol (VITAMIN D) 1000 UNITS tablet Take 1,000 Units by mouth daily.    [name] diltiazem (CARDIZEM CD) 240 MG 24 hr capsule Take 1 capsule (240 mg total) by mouth daily. 90 capsule 3  . docusate sodium (COLACE) 100 MG  capsule Take 100 mg by mouth daily.     . finasteride (PROSCAR) 5 MG tablet Take 1 tablet (5 mg total) by mouth daily. For prostate 90 tablet 1  . galantamine (RAZADYNE ER) 24 MG 24 hr capsule TAKE ONE CAPSULE EVERY DAY WITH BREAKFAST 30 capsule 5  . isosorbide mononitrate (IMDUR) 30 MG 24 hr tablet TAKE ONE TABLET BY MOUTH ONCE DAILY 90 tablet 1  . levothyroxine (SYNTHROID, LEVOTHROID) 75 MCG tablet Take one tablet by mouth once daily for thyroid 90 tablet 2  . loratadine (CLARITIN) 10 MG tablet Take 10 mg by mouth daily.    [name] lovastatin (MEVACOR) 40 MG tablet TAKE ONE TABLET BY MOUTH AT BEDTIME FOR CHOLESTEROL 90 tablet 0  . Multiple Vitamin (MULTIVITAMIN WITH MINERALS) TABS tablet Take 1 tablet by mouth daily.    [name] NAMENDA XR 28 MG CP24 24 hr capsule TAKE ONE CAPSULE BY MOUTH ONCE DAILY 90 capsule 1  . nitroGLYCERIN (NITROSTAT) 0.4 MG SL tablet One under the tongue if needed for chest tightness. Repeat in 5 minutes if needed. Repeat again in 5 minutes if needed. 25 tablet 12  . sertraline (ZOLOFT) 50 MG tablet Take 1 tablet (50 mg total) by mouth daily. 90 tablet 3  . tamsulosin (FLOMAX) 0.4 MG CAPS capsule Take one capsule by mouth once daily to help relieve obstruction 90 capsule 2  . traZODone (DESYREL) 100 MG tablet Take one tablet  by mouth once at bedtime to help rest 90 tablet 1  . zinc gluconate 50 MG tablet Take 50 mg by mouth at bedtime.    [name] zolpidem (AMBIEN) 10 MG tablet TAKE ONE TABLET BY MOUTH AT BEDTIME AS NEEDED FOR SLEEP 30 tablet 0   No current facility-administered medications for this visit.    Allergies  Iodine and Iohexol  Electrocardiogram:   03/08/14  SR rate 63  Old IMI  No change from 2014  09/14/15  SR rate 65 old IMI poor R Wave progression   Assessment and Plan CAD:  Stable with no angina and good activity level.  Continue medical Rx  Chronic RCA occlusion with collaterals no hemodynically significant left sided disease cath 2015  Chol:  On statin  Cholesterol is at goal.  Continue current dose of statin and diet Rx.  No myalgias or side effects.  F/U  LFT's in 6 months. No results found for: [facility] labs with primary   Depression:  Memory seems worse today continue zoloft  F/u primary   HTN:  Well controlled.  Continue current medications and low sodium Dash type diet.    Prostate:  On proscar No results found for: PSA   Thyroid:  On replacement  Lab Results  Component Value Date   TSH 2.030 12/29/2014

## 2015-09-14 ENCOUNTER — Encounter: Payer: Self-pay | Admitting: Cardiovascular Disease

## 2015-09-14 ENCOUNTER — Other Ambulatory Visit: Payer: Self-pay | Admitting: Internal Medicine

## 2015-09-14 ENCOUNTER — Ambulatory Visit (INDEPENDENT_AMBULATORY_CARE_PROVIDER_SITE_OTHER): Payer: Medicare Other | Admitting: Cardiovascular Disease

## 2015-09-14 VITALS — BP 142/80 | HR 63 | Ht 70.0 in | Wt 170.4 lb

## 2015-09-14 DIAGNOSIS — I10 Essential (primary) hypertension: Secondary | ICD-10-CM | POA: Diagnosis not present

## 2015-09-14 MED ORDER — ZOLPIDEM TARTRATE 10 MG PO TABS: ORAL_TABLET | ORAL | Status: DC

## 2015-09-14 MED ORDER — DILTIAZEM HCL ER COATED BEADS 240 MG PO CP24: 240.0000 mg | ORAL_CAPSULE | Freq: Every day | ORAL | Status: DC

## 2015-09-14 NOTE — Addendum Note (Signed)
Addended by: Denyse Amass on: 09/14/2015 12:02 PM   Modules accepted: Orders

## 2015-09-14 NOTE — Patient Instructions (Signed)

## 2015-09-14 NOTE — Telephone Encounter (Signed)
Reprint of Rx due to listing wrong signing provider.

## 2015-10-11 ENCOUNTER — Other Ambulatory Visit: Payer: Self-pay | Admitting: Internal Medicine

## 2015-10-13 ENCOUNTER — Other Ambulatory Visit: Payer: Self-pay | Admitting: Internal Medicine

## 2015-11-09 ENCOUNTER — Other Ambulatory Visit: Payer: Self-pay | Admitting: Internal Medicine

## 2015-11-14 NOTE — Addendum Note (Signed)
Addended by: Moshe Cipro Madisin Hasan A on: 11/14/2015 04:54 PM   Modules accepted: Orders

## 2015-11-14 NOTE — Addendum Note (Signed)
Addended by: Moshe Cipro Shalina Norfolk A on: 11/14/2015 04:55 PM   Modules accepted: Orders

## 2015-11-14 NOTE — Addendum Note (Signed)
Addended by: Moshe Cipro Vantasia Pinkney A on: 11/14/2015 04:54 PM   Modules accepted: Orders

## 2015-11-14 NOTE — Addendum Note (Signed)
Addended by: Moshe Cipro MESHELL A on: 11/14/2015 04:54 PM   Modules accepted: Orders

## 2015-11-20 ENCOUNTER — Other Ambulatory Visit: Payer: Self-pay

## 2015-11-22 ENCOUNTER — Encounter: Payer: Self-pay | Admitting: Internal Medicine

## 2015-11-27 ENCOUNTER — Other Ambulatory Visit: Payer: Medicare Other

## 2015-11-27 DIAGNOSIS — I10 Essential (primary) hypertension: Secondary | ICD-10-CM | POA: Diagnosis not present

## 2015-11-27 DIAGNOSIS — R972 Elevated prostate specific antigen [PSA]: Principal | ICD-10-CM

## 2015-11-27 DIAGNOSIS — E782 Mixed hyperlipidemia: Secondary | ICD-10-CM

## 2015-11-27 DIAGNOSIS — N400 Enlarged prostate without lower urinary tract symptoms: Secondary | ICD-10-CM | POA: Diagnosis not present

## 2015-11-27 LAB — COMPREHENSIVE METABOLIC PANEL
ALBUMIN: 4.3 g/dL (ref 3.6–5.1)
ALK PHOS: 93 U/L (ref 40–115)
ALT: 19 U/L (ref 9–46)
AST: 23 U/L (ref 10–35)
BILIRUBIN TOTAL: 1 mg/dL (ref 0.2–1.2)
BUN: 12 mg/dL (ref 7–25)
CALCIUM: 9.3 mg/dL (ref 8.6–10.3)
CO2: 27 mmol/L (ref 20–31)
CREATININE: 1.26 mg/dL — AB (ref 0.70–1.18)
Chloride: 103 mmol/L (ref 98–110)
Glucose, Bld: 87 mg/dL (ref 65–99)
Potassium: 4 mmol/L (ref 3.5–5.3)
SODIUM: 138 mmol/L (ref 135–146)
TOTAL PROTEIN: 6.3 g/dL (ref 6.1–8.1)

## 2015-11-27 LAB — PSA: PSA: 0.7 ng/mL (ref <=4.0)

## 2015-11-27 LAB — LIPID PANEL
CHOLESTEROL: 175 mg/dL (ref 125–200)
HDL: 73 mg/dL (ref >=40)
LDL CALC: 74 mg/dL (ref <130)
TRIGLYCERIDES: 142 mg/dL (ref <150)
Total CHOL/HDL Ratio: 2.4 Ratio (ref <=5.0)
VLDL: 28 mg/dL (ref <30)

## 2015-11-29 ENCOUNTER — Ambulatory Visit (INDEPENDENT_AMBULATORY_CARE_PROVIDER_SITE_OTHER): Payer: Medicare Other | Admitting: Internal Medicine

## 2015-11-29 ENCOUNTER — Encounter: Payer: Self-pay | Admitting: Internal Medicine

## 2015-11-29 VITALS — BP 138/66 | HR 77 | Temp 98.4°F | Resp 14 | Ht 69.08 in | Wt 170.0 lb

## 2015-11-29 DIAGNOSIS — R5383 Other fatigue: Secondary | ICD-10-CM | POA: Insufficient documentation

## 2015-11-29 DIAGNOSIS — N401 Enlarged prostate with lower urinary tract symptoms: Secondary | ICD-10-CM

## 2015-11-29 DIAGNOSIS — E782 Mixed hyperlipidemia: Secondary | ICD-10-CM | POA: Diagnosis not present

## 2015-11-29 DIAGNOSIS — G4700 Insomnia, unspecified: Secondary | ICD-10-CM | POA: Diagnosis not present

## 2015-11-29 DIAGNOSIS — R58 Hemorrhage, not elsewhere classified: Secondary | ICD-10-CM | POA: Insufficient documentation

## 2015-11-29 DIAGNOSIS — I10 Essential (primary) hypertension: Secondary | ICD-10-CM

## 2015-11-29 DIAGNOSIS — R5382 Chronic fatigue, unspecified: Secondary | ICD-10-CM | POA: Diagnosis not present

## 2015-11-29 DIAGNOSIS — R05 Cough: Secondary | ICD-10-CM

## 2015-11-29 DIAGNOSIS — E039 Hypothyroidism, unspecified: Secondary | ICD-10-CM | POA: Diagnosis not present

## 2015-11-29 DIAGNOSIS — F5104 Psychophysiologic insomnia: Secondary | ICD-10-CM

## 2015-11-29 DIAGNOSIS — G301 Alzheimer's disease with late onset: Secondary | ICD-10-CM

## 2015-11-29 DIAGNOSIS — R059 Cough, unspecified: Secondary | ICD-10-CM | POA: Insufficient documentation

## 2015-11-29 DIAGNOSIS — F0280 Dementia in other diseases classified elsewhere without behavioral disturbance: Secondary | ICD-10-CM

## 2015-11-29 DIAGNOSIS — N138 Other obstructive and reflux uropathy: Secondary | ICD-10-CM

## 2015-11-29 LAB — CBC WITH DIFFERENTIAL/PLATELET
BASOS ABS: 0 {cells}/uL (ref 0–200)
Basophils Relative: 0 %
EOS ABS: 248 {cells}/uL (ref 15–500)
EOS PCT: 4 %
HCT: 43.2 % (ref 38.5–50.0)
Hemoglobin: 14.5 g/dL (ref 13.2–17.1)
LYMPHS PCT: 16 %
Lymphs Abs: 992 cells/uL (ref 850–3900)
MCH: 32.1 pg (ref 27.0–33.0)
MCHC: 33.6 g/dL (ref 32.0–36.0)
MCV: 95.6 fL (ref 80.0–100.0)
MONOS PCT: 6 %
MPV: 10.8 fL (ref 7.5–12.5)
Monocytes Absolute: 372 cells/uL (ref 200–950)
NEUTROS ABS: 4588 {cells}/uL (ref 1500–7800)
Neutrophils Relative %: 74 %
PLATELETS: 206 10*3/uL (ref 140–400)
RBC: 4.52 MIL/uL (ref 4.20–5.80)
RDW: 13.2 % (ref 11.0–15.0)
WBC: 6.2 10*3/uL (ref 3.8–10.8)

## 2015-11-29 LAB — TSH: TSH: 1.66 m[IU]/L (ref 0.40–4.50)

## 2015-11-29 NOTE — Progress Notes (Signed)
HISTORY AND PHYSICAL  Location:       Place of Service:     Extended Emergency Contact Information Primary Emergency Contact: Frymire,Peggy "Margaret" Address: [address]          [place], [place] Phone: [phone] Mobile Phone: [phone] Relation: Spouse  Advanced Directive information    Chief Complaint  Patient presents with  . Annual Exam    Yearly check-up, EKG completed by cardiologist, discuss labs (Copy printed), and MMSE 30/30, passed clock drawing.    HPI:  Cough for 2 months. Some phlegm. No fever or nightsweats.  Fatigue. Nocturia x 1.  Present for at least 6 months. Plays tennis 3 days weekly.  Easy bruising. Medial left thigh and forearms recently.  Wife had knee replacement and he is assisting in her home care.  Late onset Alzheimer's disease without behavioral disturbance - Patient has had an atypical course for Alzheimer's disease. I think this diagnosis really is in question, although he has had memory disturbances in the past. In fact that he has perfect MMSE several years following the Alzheimer's diagnosis make 6 skeptical that he truly has Alzheimer's. -   Chronic insomnia - persistent problem is basically unchanged over the last several years. Patient continues to drink bourbon at night. He continues to take Xanax to help him get to sleep. He says that he gets 8-9 hours of sleep and only gets up once to urinate. .  Essential hypertension, benign controlled -   Hypothyroidism, unspecified hypothyroidism type -   Mixed hyperlipidemia -   BPH (benign prostatic hypertrophy) with urinary obstruction - .stable .PSA back to normal .  Chronic fatigue - patient says he feels tired all the time except when he is playing tennis or activity. He denies anxiety at this time or depression. He thinks he is sleeping relatively well. These complaints have surface from time to time in the past but never have been associated with  a significant medical issue.      Past Medical History:  Diagnosis Date  . Actinic keratosis   . Alzheimer's disease   . Anal fissure   . Anxiety state, unspecified   . Benign neoplasm of colon   . BENIGN PROSTATIC HYPERTROPHY, HX OF   . BPH (benign prostatic hypertrophy) with urinary obstruction 06/22/2008   Qualifier: Diagnosis of  By: Johnsie Cancel, MD, Rona Ravens   . Cerebrovascular disease, unspecified   . Coronary atherosclerosis of native coronary artery   . Depressive disorder, not elsewhere classified   . Diverticulosis of colon (without mention of hemorrhage)   . Essential hypertension, benign   . External hemorrhoids without mention of complication   . Insomnia, unspecified   . Mixed hyperlipidemia   . Osteoarthrosis, unspecified whether generalized or localized, unspecified site   . Other premature beats   . Other psoriasis   . Pain in joint, lower leg   . Pain in joint, site unspecified   . Palpitations   . Pressure ulcer   . Reflux esophagitis   . Unspecified essential hypertension   . Unspecified hereditary and idiopathic peripheral neuropathy   . Unspecified hypothyroidism   . Unspecified vitamin D deficiency     Past Surgical History:  Procedure Laterality Date  . CERVICAL DISC SURGERY    . EYE SURGERY Left 02/07/2014   Cataract Surgery Left Eye  . KNEE SURGERY  07/18/2006   Dr French Ana   . LAMINECTOMY C3 disc  1972  .  LEFT HEART CATHETERIZATION WITH CORONARY ANGIOGRAM N/A 01/28/2013   Procedure: LEFT HEART CATHETERIZATION WITH CORONARY ANGIOGRAM;  Surgeon: Josue Hector, MD;  Location: [facility] CATH LAB;  Service: Cardiovascular;  Laterality: N/A;  . MULTIPLE TOOTH EXTRACTIONS  2014  . RETINAL TEAR REPAIR CRYOTHERAPY     Dr Wayne Sever     Patient Care Team: Estill Dooms, MD as PCP - General (Internal Medicine) Clent Jacks, MD as Consulting Physician (Ophthalmology) Collene Gobble, MD as Consulting Physician (Pulmonary Disease) Angelia Mould, MD  as Consulting Physician (Vascular Surgery) Earlie Server, MD as Consulting Physician (Orthopedic Surgery) Wilford Corner, MD as Consulting Physician (Gastroenterology) Josue Hector, MD as Consulting Physician (Cardiology) Chesley Mires, MD as Consulting Physician (Pulmonary Disease)  Social History   Social History  . Marital status: Married    Spouse name: N/A  . Number of children: N/A  . Years of education: N/A   Occupational History  . retired    Social History Main Topics  . Smoking status: Former Smoker    Packs/day: 2.00    Years: 25.00    Types: Cigarettes    Quit date: 04/22/1992  . Smokeless tobacco: Never Used  . Alcohol use 0.0 oz/week     Comment: beer/wine/liquor daily  . Drug use: No  . Sexual activity: Not Currently   Other Topics Concern  . Not on file   Social History Narrative  . No narrative on file    reports that he quit smoking about 23 years ago. His smoking use included Cigarettes. He has a 50.00 pack-year smoking history. He has never used smokeless tobacco. He reports that he drinks alcohol. He reports that he does not use drugs.  Family History  Problem Relation Age of Onset  . Cancer Father     pancreatic  . Cancer Mother     pancreatic   Family Status  Relation Status  . Father Deceased  . Mother Deceased  . Daughter Alive  . Son Alive  . Daughter Alive  . Daughter Alive    Immunization History  Administered Date(s) Administered  . Influenza,inj,Quad PF,36+ Mos 03/03/2013, 05/24/2015  . Pneumococcal Polysaccharide-23 11/14/1998  . Td 11/14/1998  . Tdap 06/18/2011  . Zoster 05/27/2007    Allergies  Allergen Reactions  . Iodine Swelling    Internal iodine  . Iohexol Hives         Medications: Patient's Medications  New Prescriptions   No medications on file  Previous Medications   ALPRAZOLAM (XANAX) 0.5 MG TABLET    TAKE ONE TABLET BY MOUTH TWICE DAILY AS NEEDED FOR ANXIETY   ASPIRIN EC 81 MG TABLET    Take 81  mg by mouth at bedtime.   B COMPLEX VITAMINS TABLET    Take 1 tablet by mouth daily.   CHOLECALCIFEROL (VITAMIN D) 1000 UNITS TABLET    Take 1,000 Units by mouth daily.   DILTIAZEM (CARDIZEM CD) 240 MG 24 HR CAPSULE    Take 1 capsule (240 mg total) by mouth daily.   DOCUSATE SODIUM (COLACE) 100 MG CAPSULE    Take 100 mg by mouth daily.    FINASTERIDE (PROSCAR) 5 MG TABLET    Take 1 tablet (5 mg total) by mouth daily. For prostate   GALANTAMINE (RAZADYNE ER) 24 MG 24 HR CAPSULE    TAKE ONE CAPSULE EVERY DAY WITH BREAKFAST   ISOSORBIDE MONONITRATE (IMDUR) 30 MG 24 HR TABLET    TAKE ONE TABLET BY MOUTH ONCE DAILY  LEVOTHYROXINE (SYNTHROID, LEVOTHROID) 75 MCG TABLET    Take one tablet by mouth once daily for thyroid   LORATADINE (CLARITIN) 10 MG TABLET    Take 10 mg by mouth daily.   LOVASTATIN (MEVACOR) 40 MG TABLET    TAKE ONE TABLET BY MOUTH AT BEDTIME FOR CHOLESTEROL   MULTIPLE VITAMIN (MULTIVITAMIN WITH MINERALS) TABS TABLET    Take 1 tablet by mouth daily.   MYRBETRIQ 25 MG TB24 TABLET    Take 25 mg by mouth daily.   NAMENDA XR 28 MG CP24 24 HR CAPSULE    TAKE ONE CAPSULE BY MOUTH ONCE DAILY   NITROGLYCERIN (NITROSTAT) 0.4 MG SL TABLET    One under the tongue if needed for chest tightness. Repeat in 5 minutes if needed. Repeat again in 5 minutes if needed.   SERTRALINE (ZOLOFT) 50 MG TABLET    Take 1 tablet (50 mg total) by mouth daily.   TAMSULOSIN (FLOMAX) 0.4 MG CAPS CAPSULE    TAKE ONE CAPSULE BY MOUTH ONCE DAILY TO HELP RELIEVE OBSTRUCTION   TRAZODONE (DESYREL) 100 MG TABLET    Take one tablet by mouth once at bedtime to help rest   ZINC GLUCONATE 50 MG TABLET    Take 50 mg by mouth at bedtime.   ZOLPIDEM (AMBIEN) 10 MG TABLET    TAKE ONE TABLET BY MOUTH ONCE DAILY AT BEDTIME AS NEEDED FOR SLEEP  Modified Medications   No medications on file  Discontinued Medications   No medications on file    Review of Systems  Constitutional: Negative for activity change, appetite change,  chills, fatigue and fever.  HENT: Negative for congestion.   Eyes: Negative.   Respiratory: Negative for cough and shortness of breath.   Cardiovascular: Negative for chest pain, palpitations and leg swelling.  Gastrointestinal: Negative for abdominal distention, abdominal pain, constipation, nausea and vomiting.  Genitourinary: Negative for difficulty urinating and dysuria.  Musculoskeletal: Negative for arthralgias and myalgias.  Skin: Negative for color change and wound.  Neurological: Positive for weakness. Negative for dizziness, light-headedness and headaches.  Psychiatric/Behavioral: Positive for confusion.    Vitals:   11/29/15 1401  BP: 138/66  Pulse: 77  Resp: 14  Temp: 98.4 F (36.9 C)  TempSrc: Oral  SpO2: 97%  Weight: 170 lb (77.1 kg)  Height: 5' 9.08" (1.755 m)   Body mass index is 25.05 kg/m. Filed Weights   11/29/15 1401  Weight: 170 lb (77.1 kg)     Physical Exam  Constitutional: He is oriented to person, place, and time. He appears well-developed and well-nourished.  HENT:  Head: Normocephalic and atraumatic.  Nose: Right sinus exhibits no frontal sinus tenderness. Left sinus exhibits no frontal sinus tenderness.  Mouth/Throat: Oropharynx is clear and moist and mucous membranes are normal. No oropharyngeal exudate.  Eyes: Conjunctivae and EOM are normal. Pupils are equal, round, and reactive to light. No scleral icterus.  Neck: Normal range of motion. Neck supple. No thyromegaly present.  Cardiovascular: Normal rate and regular rhythm.   Murmur heard. no distal LE swelling  Pulmonary/Chest: Effort normal. He has no rhonchi.  Abdominal: Soft. Bowel sounds are normal. He exhibits no distension, no abdominal bruit, no pulsatile midline mass and no mass. There is no tenderness. There is no rebound, no guarding and no CVA tenderness.  Musculoskeletal: He exhibits no edema.  Lymphadenopathy:    He has no cervical adenopathy.  Neurological: He is alert and  oriented to person, place, and time. He has normal reflexes.  11/29/15 MMSE 30/30. Passed clock drawing  Skin: Skin is warm and dry. No rash noted.  Psychiatric: His behavior is normal. Judgment and thought content normal. His affect is not blunt. He does not exhibit a depressed mood.    Labs reviewed: Lab Summary Latest Ref Rng & Units 11/27/2015 12/29/2014  Hemoglobin 12.6 - 17.7 g/dL (None) 14.7  Hematocrit 37.5 - 51.0 % (None) 44.1  White count 3.4 - 10.8 x10E3/uL (None) 7.1  Platelet count 150 - 379 x10E3/uL (None) 214  Sodium 135 - 146 mmol/L 138 138  Potassium 3.5 - 5.3 mmol/L 4.0 4.7  Calcium 8.6 - 10.3 mg/dL 9.3 9.8  Phosphorus - (None) (None)  Creatinine 0.70 - 1.18 mg/dL 1.26(H) 1.25  AST 10 - 35 U/L 23 30  Alk Phos 40 - 115 U/L 93 105  Bilirubin 0.2 - 1.2 mg/dL 1.0 0.9  Glucose 65 - 99 mg/dL 87 101(H)  Cholesterol 125 - 200 mg/dL 175 (None)  HDL cholesterol >=40 mg/dL 73 (None)  Triglycerides <150 mg/dL 142 (None)  LDL Direct - (None) (None)  LDL Calc <130 mg/dL 74 (None)  Total protein 6.1 - 8.1 g/dL 6.3 (None)  Albumin 3.6 - 5.1 g/dL 4.3 4.8  Some recent data might be hidden   Lab Results  Component Value Date   BUN 12 11/27/2015   No results found for: HGBA1C Lab Results  Component Value Date   TSH 2.030 12/29/2014     Assessment/Plan  1. Late onset Alzheimer's disease without behavioral disturbance Surprising resut of MMSE 30/30! rermains on [place].  2. Chronic insomnia Unchanged.Continues to drink bourbon in the evening and then take Xanax.  3. Essential hypertension, benign Controlled  4. Hypothyroidism, unspecified hypothyroidism type - TSH  5. Mixed hyperlipidemia Controlled  6. BPH (benign prostatic hypertrophy) with urinary obstruction Nocturia 1. Slow stream.  7. Chronic fatigue Uneasy feelings. Fatigue does not seem to interfere with tennis games or things that he is doing and actively. - CBC with Differential/Platelet -  Sedimentation rate - TSH  8. Ecchymosis Demonstrates large areas with new ecchymoses on the left medial thigh and on the forearms.  9. Cough -Mucinex DM one twice daily - DG Chest 2 View; Future

## 2015-11-30 LAB — SEDIMENTATION RATE: Sed Rate: 1 mm/hr (ref 0–20)

## 2015-12-05 ENCOUNTER — Other Ambulatory Visit: Payer: Self-pay | Admitting: Internal Medicine

## 2015-12-05 DIAGNOSIS — E038 Other specified hypothyroidism: Secondary | ICD-10-CM

## 2015-12-07 ENCOUNTER — Other Ambulatory Visit: Payer: Self-pay | Admitting: Internal Medicine

## 2015-12-08 ENCOUNTER — Other Ambulatory Visit: Payer: Self-pay | Admitting: *Deleted

## 2015-12-08 MED ORDER — ZOLPIDEM TARTRATE 10 MG PO TABS: ORAL_TABLET | ORAL | 1 refills | Status: DC

## 2015-12-08 NOTE — Telephone Encounter (Signed)
Vickii Chafe, wife called and requested. Phoned into pharmacy.

## 2016-01-04 ENCOUNTER — Other Ambulatory Visit: Payer: Self-pay | Admitting: Internal Medicine

## 2016-01-18 ENCOUNTER — Encounter: Payer: Self-pay | Admitting: Nurse Practitioner

## 2016-01-18 ENCOUNTER — Ambulatory Visit (INDEPENDENT_AMBULATORY_CARE_PROVIDER_SITE_OTHER): Payer: Medicare Other | Admitting: Nurse Practitioner

## 2016-01-18 VITALS — BP 126/68 | HR 64 | Temp 97.8°F | Resp 18 | Ht 69.0 in | Wt 172.4 lb

## 2016-01-18 DIAGNOSIS — J0100 Acute maxillary sinusitis, unspecified: Secondary | ICD-10-CM

## 2016-01-18 MED ORDER — AMOXICILLIN 500 MG PO CAPS: 500.0000 mg | ORAL_CAPSULE | Freq: Three times a day (TID) | ORAL | 0 refills | Status: DC

## 2016-01-18 MED ORDER — CETIRIZINE HCL 10 MG PO TABS: 10.0000 mg | ORAL_TABLET | Freq: Every day | ORAL | 11 refills | Status: AC

## 2016-01-18 NOTE — Patient Instructions (Addendum)
Stop Claritin and start using Zyrtec (cetirizine) over the counter- daily  Cont to use mucinex DM twice daily as needed cough and congestion nettipot twice daily and plain nasal saline spray as needed    Sinusitis, Adult Sinusitis is redness, soreness, and inflammation of the paranasal sinuses. Paranasal sinuses are air pockets within the bones of your face. They are located beneath your eyes, in the middle of your forehead, and above your eyes. In healthy paranasal sinuses, mucus is able to drain out, and air is able to circulate through them by way of your nose. However, when your paranasal sinuses are inflamed, mucus and air can become trapped. This can allow bacteria and other germs to grow and cause infection. Sinusitis can develop quickly and last only a short time (acute) or continue over a long period (chronic). Sinusitis that lasts for more than 12 weeks is considered chronic. CAUSES Causes of sinusitis include:  Allergies.  Structural abnormalities, such as displacement of the cartilage that separates your nostrils (deviated septum), which can decrease the air flow through your nose and sinuses and affect sinus drainage.  Functional abnormalities, such as when the small hairs (cilia) that line your sinuses and help remove mucus do not work properly or are not present. SIGNS AND SYMPTOMS Symptoms of acute and chronic sinusitis are the same. The primary symptoms are pain and pressure around the affected sinuses. Other symptoms include:  Upper toothache.  Earache.  Headache.  Bad breath.  Decreased sense of smell and taste.  A cough, which worsens when you are lying flat.  Fatigue.  Fever.  Thick drainage from your nose, which often is green and may contain pus (purulent).  Swelling and warmth over the affected sinuses. DIAGNOSIS Your health care provider will perform a physical exam. During your exam, your health care provider may perform any of the following to help  determine if you have acute sinusitis or chronic sinusitis:  Look in your nose for signs of abnormal growths in your nostrils (nasal polyps).  Tap over the affected sinus to check for signs of infection.  View the inside of your sinuses using an imaging device that has a light attached (endoscope). If your health care provider suspects that you have chronic sinusitis, one or more of the following tests may be recommended:  Allergy tests.  Nasal culture. A sample of mucus is taken from your nose, sent to a lab, and screened for bacteria.  Nasal cytology. A sample of mucus is taken from your nose and examined by your health care provider to determine if your sinusitis is related to an allergy. TREATMENT Most cases of acute sinusitis are related to a viral infection and will resolve on their own within 10 days. Sometimes, medicines are prescribed to help relieve symptoms of both acute and chronic sinusitis. These may include pain medicines, decongestants, nasal steroid sprays, or saline sprays. However, for sinusitis related to a bacterial infection, your health care provider will prescribe antibiotic medicines. These are medicines that will help kill the bacteria causing the infection. Rarely, sinusitis is caused by a fungal infection. In these cases, your health care provider will prescribe antifungal medicine. For some cases of chronic sinusitis, surgery is needed. Generally, these are cases in which sinusitis recurs more than 3 times per year, despite other treatments. HOME CARE INSTRUCTIONS  Drink plenty of water. Water helps thin the mucus so your sinuses can drain more easily.  Use a humidifier.  Inhale steam 3-4 times a day (for  example, sit in the bathroom with the shower running).  Apply a warm, moist washcloth to your face 3-4 times a day, or as directed by your health care provider.  Use saline nasal sprays to help moisten and clean your sinuses.  Take medicines only as  directed by your health care provider.  If you were prescribed either an antibiotic or antifungal medicine, finish it all even if you start to feel better. SEEK IMMEDIATE MEDICAL CARE IF:  You have increasing pain or severe headaches.  You have nausea, vomiting, or drowsiness.  You have swelling around your face.  You have vision problems.  You have a stiff neck.  You have difficulty breathing.   This information is not intended to replace advice given to you by your health care provider. Make sure you discuss any questions you have with your health care provider.   Document Released: 04/08/2005 Document Revised: 04/29/2014 Document Reviewed: 04/23/2011 Elsevier Interactive Patient Education [employer].

## 2016-01-18 NOTE — Progress Notes (Signed)
Careteam: Patient Care Team: Estill Dooms, MD as PCP - General (Internal Medicine) Clent Jacks, MD as Consulting Physician (Ophthalmology) Collene Gobble, MD as Consulting Physician (Pulmonary Disease) Angelia Mould, MD as Consulting Physician (Vascular Surgery) Earlie Server, MD as Consulting Physician (Orthopedic Surgery) Wilford Corner, MD as Consulting Physician (Gastroenterology) Josue Hector, MD as Consulting Physician (Cardiology) Chesley Mires, MD as Consulting Physician (Pulmonary Disease)  Advanced Directive information Does patient have an advance directive?: Yes, Type of Advance Directive: Healthcare Power of [place];Living will  Allergies  Allergen Reactions  . Iodine Swelling    Internal iodine  . Iohexol Hives         Chief Complaint  Patient presents with  . Acute Visit    Sinus congestion & cough with yellow mucus x 2 months.      HPI: Patient is a 81 y.o. male seen in the office today due to ongoing sinus congestion for 2 months.  Was recommended to get mucinex and it usually works however he has taken 2 boxes and he still has cough, congestion and sinus pressure.  No shortness of breath, playing tennis twice a week. Congestion in sinuses with increase cough.  No fevers or chills.  Ears feel stopped up.   Review of Systems:  Review of Systems  Constitutional: Negative for activity change, appetite change, chills, fatigue and fever.  HENT: Positive for congestion, postnasal drip and sinus pressure. Negative for sore throat.   Eyes: Negative.   Respiratory: Negative for cough, shortness of breath and wheezing.   Cardiovascular: Negative for chest pain, palpitations and leg swelling.  Allergic/Immunologic: Positive for environmental allergies.  Neurological: Negative for dizziness, light-headedness and headaches.  Psychiatric/Behavioral: Positive for confusion.    Past Medical History:  Diagnosis Date  . Actinic keratosis   .  Alzheimer's disease   . Anal fissure   . Anxiety state, unspecified   . Benign neoplasm of colon   . BENIGN PROSTATIC HYPERTROPHY, HX OF   . BPH (benign prostatic hypertrophy) with urinary obstruction 06/22/2008   Qualifier: Diagnosis of  By: Johnsie Cancel, MD, Rona Ravens   . Cerebrovascular disease, unspecified   . Coronary atherosclerosis of native coronary artery   . Depressive disorder, not elsewhere classified   . Diverticulosis of colon (without mention of hemorrhage)   . Essential hypertension, benign   . External hemorrhoids without mention of complication   . Insomnia, unspecified   . Mixed hyperlipidemia   . Osteoarthrosis, unspecified whether generalized or localized, unspecified site   . Other premature beats   . Other psoriasis   . Pain in joint, lower leg   . Pain in joint, site unspecified   . Palpitations   . Pressure ulcer   . Reflux esophagitis   . Unspecified essential hypertension   . Unspecified hereditary and idiopathic peripheral neuropathy   . Unspecified hypothyroidism   . Unspecified vitamin D deficiency    Past Surgical History:  Procedure Laterality Date  . CERVICAL DISC SURGERY    . EYE SURGERY Left 02/07/2014   Cataract Surgery Left Eye  . KNEE SURGERY  07/18/2006   Dr French Ana   . LAMINECTOMY C3 disc  1972  . LEFT HEART CATHETERIZATION WITH CORONARY ANGIOGRAM N/A 01/28/2013   Procedure: LEFT HEART CATHETERIZATION WITH CORONARY ANGIOGRAM;  Surgeon: Josue Hector, MD;  Location: [facility] CATH LAB;  Service: Cardiovascular;  Laterality: N/A;  . MULTIPLE TOOTH EXTRACTIONS  2014  . RETINAL TEAR REPAIR CRYOTHERAPY  Dr Wayne Sever    Social History:   reports that he quit smoking about 23 years ago. His smoking use included Cigarettes. He has a 50.00 pack-year smoking history. He has never used smokeless tobacco. He reports that he drinks alcohol. He reports that he does not use drugs.  Family History  Problem Relation Age of Onset  . Cancer Father      pancreatic  . Cancer Mother     pancreatic    Medications: Patient's Medications  New Prescriptions   No medications on file  Previous Medications   ALPRAZOLAM (XANAX) 0.5 MG TABLET    TAKE ONE TABLET BY MOUTH TWICE DAILY AS NEEDED FOR ANXIETY   ASPIRIN EC 81 MG TABLET    Take 81 mg by mouth at bedtime.   B COMPLEX VITAMINS TABLET    Take 1 tablet by mouth daily.   CHOLECALCIFEROL (VITAMIN D) 1000 UNITS TABLET    Take 1,000 Units by mouth daily.   DILTIAZEM (CARDIZEM CD) 240 MG 24 HR CAPSULE    Take 1 capsule (240 mg total) by mouth daily.   DOCUSATE SODIUM (COLACE) 100 MG CAPSULE    Take 100 mg by mouth daily.    FINASTERIDE (PROSCAR) 5 MG TABLET    Take 1 tablet (5 mg total) by mouth daily. For prostate   GALANTAMINE (RAZADYNE ER) 24 MG 24 HR CAPSULE    TAKE ONE CAPSULE EVERY DAY WITH BREAKFAST   ISOSORBIDE MONONITRATE (IMDUR) 30 MG 24 HR TABLET    TAKE ONE TABLET BY MOUTH ONCE DAILY   LEVOTHYROXINE (SYNTHROID, LEVOTHROID) 75 MCG TABLET    TAKE ONE TABLET BY MOUTH ONCE DAILY   LORATADINE (CLARITIN) 10 MG TABLET    Take 10 mg by mouth daily.   LOVASTATIN (MEVACOR) 40 MG TABLET    TAKE ONE TABLET BY MOUTH ONCE DAILY AT BEDTIME FOR  CHOLESTEROL   MULTIPLE VITAMIN (MULTIVITAMIN WITH MINERALS) TABS TABLET    Take 1 tablet by mouth daily.   MYRBETRIQ 25 MG TB24 TABLET    Take 25 mg by mouth daily.   NAMENDA XR 28 MG CP24 24 HR CAPSULE    TAKE ONE CAPSULE BY MOUTH ONCE DAILY   NITROGLYCERIN (NITROSTAT) 0.4 MG SL TABLET    One under the tongue if needed for chest tightness. Repeat in 5 minutes if needed. Repeat again in 5 minutes if needed.   SERTRALINE (ZOLOFT) 50 MG TABLET    Take 1 tablet (50 mg total) by mouth daily.   TAMSULOSIN (FLOMAX) 0.4 MG CAPS CAPSULE    TAKE ONE CAPSULE BY MOUTH ONCE DAILY TO HELP RELIEVE OBSTRUCTION   TRAZODONE (DESYREL) 100 MG TABLET    Take one tablet by mouth once at bedtime to help rest   ZINC GLUCONATE 50 MG TABLET    Take 50 mg by mouth at bedtime.    ZOLPIDEM (AMBIEN) 10 MG TABLET    Take one tablet by mouth once daily at bedtime as needed for sleep  Modified Medications   No medications on file  Discontinued Medications   No medications on file     Physical Exam:  Vitals:   01/18/16 1624  BP: 126/68  Pulse: 64  Resp: 18  Temp: 97.8 F (36.6 C)  TempSrc: Oral  SpO2: 95%  Weight: 172 lb 6.4 oz (78.2 kg)  Height: 5\' 9"  (1.753 m)   Body mass index is 25.46 kg/m.  Physical Exam  Constitutional: He is oriented to person, place, and time. He  appears well-developed and well-nourished.  HENT:  Head: Normocephalic and atraumatic.  Right Ear: External ear normal.  Left Ear: External ear normal.  Nose: Nose normal. Right sinus exhibits no frontal sinus tenderness. Left sinus exhibits no frontal sinus tenderness.  Mouth/Throat: Oropharynx is clear and moist and mucous membranes are normal. No oropharyngeal exudate.  Canals clear  Eyes: Conjunctivae and EOM are normal. Pupils are equal, round, and reactive to light.  Neck: Normal range of motion. Neck supple.  Cardiovascular: Normal rate and regular rhythm.   Murmur heard. no distal LE swelling  Pulmonary/Chest: Effort normal and breath sounds normal. No respiratory distress. He has no wheezes. He has no rhonchi.  Abdominal: Soft. Bowel sounds are normal. He exhibits no abdominal bruit and no pulsatile midline mass. There is no CVA tenderness.  Musculoskeletal: He exhibits no edema.  Neurological: He is alert and oriented to person, place, and time.  11/29/15 MMSE 30/30. Passed clock drawing  Skin: Skin is warm and dry.  Psychiatric: His behavior is normal. Judgment and thought content normal. His affect is not blunt. He does not exhibit a depressed mood.    Labs reviewed: Basic Metabolic Panel:  Recent Labs  11/27/15 1049 11/29/15 1507  NA 138  --   K 4.0  --   CL 103  --   CO2 27  --   GLUCOSE 87  --   BUN 12  --   CREATININE 1.26*  --   CALCIUM 9.3  --   TSH  --   1.66   Liver Function Tests:  Recent Labs  11/27/15 1049  AST 23  ALT 19  ALKPHOS 93  BILITOT 1.0  PROT 6.3  ALBUMIN 4.3   No results for input(s): LIPASE, AMYLASE in the last 8760 hours. No results for input(s): AMMONIA in the last 8760 hours. CBC:  Recent Labs  11/29/15 1507  WBC 6.2  NEUTROABS 4,588  HGB 14.5  HCT 43.2  MCV 95.6  PLT 206   Lipid Panel:  Recent Labs  11/27/15 1049  CHOL 175  HDL 73  LDLCALC 74  TRIG 142  CHOLHDL 2.4   TSH:  Recent Labs  11/29/15 1507  TSH 1.66   A1C: No results found for: HGBA1C   Assessment/Plan 1. Acute maxillary sinusitis, recurrence not specified Stop Claritin and start using Zyrtec (cetirizine) over the counter- daily  Cont to use mucinex DM twice daily as needed cough and congestion nettipot twice daily and plain nasal saline spray as needed  - amoxicillin (AMOXIL) 500 MG capsule; Take 1 capsule (500 mg total) by mouth 3 (three) times daily.  Dispense: 30 capsule; Refill: 0 - cetirizine (ZYRTEC) 10 MG tablet; Take 1 tablet (10 mg total) by mouth daily.  Dispense: 30 tablet; Refill: [place]. Harle Battiest  [facility] [phone] 8 am - 5 pm) [phone] (after hours)

## 2016-02-06 ENCOUNTER — Other Ambulatory Visit: Payer: Self-pay | Admitting: Internal Medicine

## 2016-02-06 ENCOUNTER — Other Ambulatory Visit: Payer: Self-pay | Admitting: Nurse Practitioner

## 2016-03-06 ENCOUNTER — Other Ambulatory Visit: Payer: Self-pay | Admitting: Internal Medicine

## 2016-03-06 DIAGNOSIS — Z961 Presence of intraocular lens: Secondary | ICD-10-CM | POA: Diagnosis not present

## 2016-03-06 DIAGNOSIS — H40013 Open angle with borderline findings, low risk, bilateral: Secondary | ICD-10-CM | POA: Diagnosis not present

## 2016-03-06 DIAGNOSIS — R3915 Urgency of urination: Secondary | ICD-10-CM | POA: Diagnosis not present

## 2016-03-06 DIAGNOSIS — R350 Frequency of micturition: Secondary | ICD-10-CM | POA: Diagnosis not present

## 2016-03-06 DIAGNOSIS — H26493 Other secondary cataract, bilateral: Secondary | ICD-10-CM | POA: Diagnosis not present

## 2016-03-12 DIAGNOSIS — M19012 Primary osteoarthritis, left shoulder: Secondary | ICD-10-CM | POA: Diagnosis not present

## 2016-04-04 ENCOUNTER — Other Ambulatory Visit: Payer: Self-pay | Admitting: Internal Medicine

## 2016-04-09 ENCOUNTER — Other Ambulatory Visit: Payer: Self-pay | Admitting: Cardiovascular Disease

## 2016-04-18 ENCOUNTER — Other Ambulatory Visit: Payer: Self-pay | Admitting: Internal Medicine

## 2016-04-18 DIAGNOSIS — M25512 Pain in left shoulder: Secondary | ICD-10-CM | POA: Diagnosis not present

## 2016-04-25 DIAGNOSIS — M25512 Pain in left shoulder: Secondary | ICD-10-CM | POA: Diagnosis not present

## 2016-04-26 DIAGNOSIS — M25512 Pain in left shoulder: Secondary | ICD-10-CM | POA: Diagnosis not present

## 2016-04-26 DIAGNOSIS — M7552 Bursitis of left shoulder: Secondary | ICD-10-CM | POA: Diagnosis not present

## 2016-04-30 DIAGNOSIS — M25512 Pain in left shoulder: Secondary | ICD-10-CM | POA: Diagnosis not present

## 2016-04-30 DIAGNOSIS — M7552 Bursitis of left shoulder: Secondary | ICD-10-CM | POA: Diagnosis not present

## 2016-05-03 ENCOUNTER — Telehealth: Payer: Self-pay | Admitting: Internal Medicine

## 2016-05-03 NOTE — Telephone Encounter (Signed)
left msg asking pt to confirm this AWV appt w/ nurse. VDM (DD) °

## 2016-05-10 ENCOUNTER — Other Ambulatory Visit: Payer: Self-pay | Admitting: Internal Medicine

## 2016-05-10 NOTE — Telephone Encounter (Signed)
left another msg asking pt to confirm this AWV appt w/ nurse. VDM (DD) °

## 2016-05-20 DIAGNOSIS — M25512 Pain in left shoulder: Secondary | ICD-10-CM | POA: Diagnosis not present

## 2016-05-21 ENCOUNTER — Ambulatory Visit (INDEPENDENT_AMBULATORY_CARE_PROVIDER_SITE_OTHER): Payer: Medicare Other

## 2016-05-21 VITALS — BP 170/90 | HR 61 | Temp 98.2°F | Ht 69.0 in | Wt 179.8 lb

## 2016-05-21 DIAGNOSIS — Z23 Encounter for immunization: Secondary | ICD-10-CM | POA: Diagnosis not present

## 2016-05-21 DIAGNOSIS — Z0000 Encounter for general adult medical examination without abnormal findings: Secondary | ICD-10-CM | POA: Diagnosis not present

## 2016-05-21 NOTE — Progress Notes (Signed)
Quick Notes   Health Maintenance:  Flu and Pn13 given today; Up to date on other maintenance    Abnormal Screen: None; MMSE-26/30 Passed Clock test    Patient Concerns:  Pt states that the Myrebetriq is no longer working as it once did. His leakage has started to increase and he wonders if he needs to up the dosage.     Nurse Concerns:  None  I have reviewed the information entered by the Health Advisor. I was present in the office during the time of patient interaction and was available for consultation. I agree with the documentation and advice.  Viviann Spare Nyoka Cowden, MD

## 2016-05-21 NOTE — Patient Instructions (Addendum)
Phillip Larson , Thank you for taking time to come for your Medicare Wellness Visit. I appreciate your ongoing commitment to your health goals. Please review the following plan we discussed and let me know if I can assist you in the future.   These are the goals we discussed: Goals    . Increase water intake          Starting 05/21/16, I will attempt to increase my water intake from 1 glass of water per day to 3 glasses per day.        This is a list of the screening recommended for you and due dates:  Health Maintenance  Topic Date Due  . Pneumonia vaccines (2 of 2 - PPSV23) 05/21/2017  . Tetanus Vaccine  06/17/2021  . Flu Shot  Completed  . Shingles Vaccine  Completed  Preventive Care for Adults  A healthy lifestyle and preventive care can promote health and wellness. Preventive health guidelines for adults include the following key practices.  . A routine yearly physical is a good way to check with your health care provider about your health and preventive screening. It is a chance to share any concerns and updates on your health and to receive a thorough exam.  . Visit your dentist for a routine exam and preventive care every 6 months. Brush your teeth twice a day and floss once a day. Good oral hygiene prevents tooth decay and gum disease.  . The frequency of eye exams is based on your age, health, family medical history, use  of contact lenses, and other factors. Follow your health care provider's ecommendations for frequency of eye exams.  . Eat a healthy diet. Foods like vegetables, fruits, whole grains, low-fat dairy products, and lean protein foods contain the nutrients you need without too many calories. Decrease your intake of foods high in solid fats, added sugars, and salt. Eat the right amount of calories for you. Get information about a proper diet from your health care provider, if necessary.  . Regular physical exercise is one of the most important things you can do for  your health. Most adults should get at least 150 minutes of moderate-intensity exercise (any activity that increases your heart rate and causes you to sweat) each week. In addition, most adults need muscle-strengthening exercises on 2 or more days a week.  Silver Sneakers may be a benefit available to you. To determine eligibility, you may visit the website: www.silversneakers.com or contact program at [phone])712-8589 Mon-Fri between 8AM-8PM.   . Maintain a healthy weight. The body mass index (BMI) is a screening tool to identify possible weight problems. It provides an estimate of body fat based on height and weight. Your health care provider can find your BMI and can help you achieve or maintain a healthy weight.   For adults 20 years and older: ? A BMI below 18.5 is considered underweight. ? A BMI of 18.5 to 24.9 is normal. ? A BMI of 25 to 29.9 is considered overweight. ? A BMI of 30 and above is considered obese.   . Maintain normal blood lipids and cholesterol levels by exercising and minimizing your intake of saturated fat. Eat a balanced diet with plenty of fruit and vegetables. Blood tests for lipids and cholesterol should begin at age 8 and be repeated every 5 years. If your lipid or cholesterol levels are high, you are over 50, or you are at high risk for heart disease, you may need your cholesterol levels  checked more frequently. Ongoing high lipid and cholesterol levels should be treated with medicines if diet and exercise are not working.  . If you smoke, find out from your health care provider how to quit. If you do not use tobacco, please do not start.  . If you choose to drink alcohol, please do not consume more than 2 drinks per day. One drink is considered to be 12 ounces (355 mL) of beer, 5 ounces (148 mL) of wine, or 1.5 ounces (44 mL) of liquor.  . If you are 17-60 years old, ask your health care provider if you should take aspirin to prevent strokes.  . Use sunscreen.  Apply sunscreen liberally and repeatedly throughout the day. You should seek shade when your shadow is shorter than you. Protect yourself by wearing long sleeves, pants, a wide-brimmed hat, and sunglasses year round, whenever you are outdoors.  . Once a month, do a whole body skin exam, using a mirror to look at the skin on your back. Tell your health care provider of new moles, moles that have irregular borders, moles that are larger than a pencil eraser, or moles that have changed in shape or color.

## 2016-05-21 NOTE — Progress Notes (Signed)
Subjective:   Phillip Larson is a 82 y.o. male who presents for an Initial Medicare Annual Wellness Visit.  Review of Systems  Cardiac Risk Factors include: advanced age (>54men, >37 women);male gender;dyslipidemia;hypertension;smoking/ tobacco exposure    Objective:    Today's Vitals   05/21/16 1440  BP: (!) 170/90  Pulse: 61  Temp: 98.2 F (36.8 C)  TempSrc: Oral  SpO2: 96%  Weight: 179 lb 12.8 oz (81.6 kg)  Height: 5\' 9"  (1.753 m)   Body mass index is 26.55 kg/m.  Current Medications (verified) Outpatient Encounter Prescriptions as of 05/21/2016  Medication Sig  . ALPRAZolam (XANAX) 0.5 MG tablet TAKE ONE TABLET BY MOUTH TWICE DAILY AS NEEDED  . aspirin EC 81 MG tablet Take 81 mg by mouth at bedtime.  [name] b complex vitamins tablet Take 1 tablet by mouth daily.  . cetirizine (ZYRTEC) 10 MG tablet Take 1 tablet (10 mg total) by mouth daily.  . cholecalciferol (VITAMIN D) 1000 UNITS tablet Take 1,000 Units by mouth daily.  [name] diltiazem (CARDIZEM CD) 240 MG 24 hr capsule Take 1 capsule (240 mg total) by mouth daily.  [name] docusate sodium (COLACE) 100 MG capsule Take 100 mg by mouth daily.   . finasteride (PROSCAR) 5 MG tablet Take 1 tablet (5 mg total) by mouth daily. For prostate  . galantamine (RAZADYNE ER) 24 MG 24 hr capsule TAKE ONE CAPSULE EVERY DAY WITH BREAKFAST  . isosorbide mononitrate (IMDUR) 30 MG 24 hr tablet TAKE ONE TABLET BY MOUTH ONCE DAILY  . levothyroxine (SYNTHROID, LEVOTHROID) 75 MCG tablet TAKE ONE TABLET BY MOUTH ONCE DAILY  . lovastatin (MEVACOR) 40 MG tablet TAKE ONE TABLET BY MOUTH ONCE DAILY AT BEDTIME AS NEEDED FOR CHOLESTEROL  . Multiple Vitamin (MULTIVITAMIN WITH MINERALS) TABS tablet Take 1 tablet by mouth daily.  [name] MYRBETRIQ 25 MG TB24 tablet Take 25 mg by mouth daily.  [name] NAMENDA XR 28 MG CP24 24 hr capsule TAKE ONE CAPSULE BY MOUTH ONCE DAILY  . nitroGLYCERIN (NITROSTAT) 0.4 MG SL tablet One under the tongue if needed for chest tightness. Repeat  in 5 minutes if needed. Repeat again in 5 minutes if needed.  . sertraline (ZOLOFT) 50 MG tablet Take 1 tablet (50 mg total) by mouth daily.  . tamsulosin (FLOMAX) 0.4 MG CAPS capsule TAKE ONE CAPSULE BY MOUTH ONCE DAILY TO HELP RELIEVE OBSTRUCTION  . traZODone (DESYREL) 100 MG tablet Take one tablet by mouth once at bedtime to help rest  . zinc gluconate 50 MG tablet Take 50 mg by mouth at bedtime.  [name] zolpidem (AMBIEN) 10 MG tablet TAKE ONE TABLET BY MOUTH AT BEDTIME  . [DISCONTINUED] amoxicillin (AMOXIL) 500 MG capsule Take 1 capsule (500 mg total) by mouth 3 (three) times daily.   No facility-administered encounter medications on file as of 05/21/2016.     Allergies (verified) Iodine and Iohexol   History: Past Medical History:  Diagnosis Date  . Actinic keratosis   . Alzheimer's disease   . Anal fissure   . Anxiety state, unspecified   . Benign neoplasm of colon   . BENIGN PROSTATIC HYPERTROPHY, HX OF   . BPH (benign prostatic hypertrophy) with urinary obstruction 06/22/2008   Qualifier: Diagnosis of  By: Johnsie Cancel, MD, Rona Ravens   . Cerebrovascular disease, unspecified   . Coronary atherosclerosis of native coronary artery   . Depressive disorder, not elsewhere classified   . Diverticulosis of colon (without mention of hemorrhage)   . Essential hypertension,  benign   . External hemorrhoids without mention of complication   . Insomnia, unspecified   . Mixed hyperlipidemia   . Osteoarthrosis, unspecified whether generalized or localized, unspecified site   . Other premature beats   . Other psoriasis   . Pain in joint, lower leg   . Pain in joint, site unspecified   . Palpitations   . Pressure ulcer   . Reflux esophagitis   . Unspecified essential hypertension   . Unspecified hereditary and idiopathic peripheral neuropathy   . Unspecified hypothyroidism   . Unspecified vitamin D deficiency    Past Surgical History:  Procedure Laterality Date  . CERVICAL DISC  SURGERY    . EYE SURGERY Left 02/07/2014   Cataract Surgery Left Eye  . KNEE SURGERY  07/18/2006   Dr French Ana   . LAMINECTOMY C3 disc  1972  . LEFT HEART CATHETERIZATION WITH CORONARY ANGIOGRAM N/A 01/28/2013   Procedure: LEFT HEART CATHETERIZATION WITH CORONARY ANGIOGRAM;  Surgeon: Josue Hector, MD;  Location: [facility] CATH LAB;  Service: Cardiovascular;  Laterality: N/A;  . MULTIPLE TOOTH EXTRACTIONS  2014  . RETINAL TEAR REPAIR CRYOTHERAPY     Dr Wayne Sever    Family History  Problem Relation Age of Onset  . Cancer Father     pancreatic  . Cancer Mother     pancreatic   Social History   Occupational History  . retired    Social History Main Topics  . Smoking status: Former Smoker    Packs/day: 2.00    Years: 25.00    Types: Cigarettes    Quit date: 04/22/1992  . Smokeless tobacco: Never Used  . Alcohol use 0.0 oz/week     Comment: beer/wine/liquor daily  . Drug use: No  . Sexual activity: Not Currently   Tobacco Counseling Counseling given: No   Activities of Daily Living In your present state of health, do you have any difficulty performing the following activities: 05/21/2016  Hearing? N  Vision? Y  Difficulty concentrating or making decisions? Y  Walking or climbing stairs? Y  Dressing or bathing? N  Doing errands, shopping? N  Preparing Food and eating ? N  Using the Toilet? N  In the past six months, have you accidently leaked urine? Y  Do you have problems with loss of bowel control? N  Managing your Medications? Y  Managing your Finances? Y  Housekeeping or managing your Housekeeping? N  Some recent data might be hidden    Immunizations and Health Maintenance Immunization History  Administered Date(s) Administered  . Influenza,inj,Quad PF,36+ Mos 03/03/2013, 05/24/2015, 05/21/2016  . Pneumococcal Conjugate-13 05/21/2016  . Pneumococcal Polysaccharide-23 11/14/1998  . Td 11/14/1998  . Tdap 06/18/2011  . Zoster 05/27/2007   There are no preventive care  reminders to display for this patient.  Patient Care Team: Estill Dooms, MD as PCP - General (Internal Medicine) Clent Jacks, MD as Consulting Physician (Ophthalmology) Collene Gobble, MD as Consulting Physician (Pulmonary Disease) Angelia Mould, MD as Consulting Physician (Vascular Surgery) Earlie Server, MD as Consulting Physician (Orthopedic Surgery) Wilford Corner, MD as Consulting Physician (Gastroenterology) Josue Hector, MD as Consulting Physician (Cardiology) Chesley Mires, MD as Consulting Physician (Pulmonary Disease)  Indicate any recent Medical Services you may have received from other than [facility] providers in the past year (date may be approximate).    Assessment:   This is a routine wellness examination for [place].  Hearing/Vision screen Hearing Screening Comments: Last hearing screen done over 20 +  yrs. Denies any problems with hearing.  Vision Screening Comments: Last eye exam done in Fall of 2017 with Dr. Katy Fitch. Hx of cataracts; currently not having any problems.   Dietary issues and exercise activities discussed: Current Exercise Habits: Home exercise routine, Type of exercise: strength training/weights (Plays tennis 3 x per week;), Time (Minutes): 60, Frequency (Times/Week): 3, Weekly Exercise (Minutes/Week): 180, Intensity: Moderate  Goals    . Increase water intake          Starting 05/21/16, I will attempt to increase my water intake from 1 glass of water per day to 3 glasses per day.       Depression Screen PHQ 2/9 Scores 05/21/2016 11/29/2015 06/22/2014 05/17/2014  PHQ - 2 Score 0 0 6 0  PHQ- 9 Score - - 14 -    Fall Risk Fall Risk  05/21/2016 01/18/2016 11/29/2015 05/24/2015 05/17/2014  Falls in the past year? Yes No No No No  Number falls in past yr: 1 - - - -  Injury with Fall? Yes - - - -    Cognitive Function: MMSE - Mini Mental State Exam 05/21/2016 11/29/2015  Orientation to time 3 5  Orientation to Place 5 5  Registration 3 3  Attention/  Calculation 4 5  Recall 2 3  Language- name 2 objects 2 2  Language- repeat 1 1  Language- follow 3 step command 3 3  Language- read & follow direction 1 1  Write a sentence 1 1  Copy design 1 1  Total score 26 30        Screening Tests Health Maintenance  Topic Date Due  . PNA vac Low Risk Adult (2 of 2 - PPSV23) 05/21/2017  . TETANUS/TDAP  06/17/2021  . INFLUENZA VACCINE  Completed  . ZOSTAVAX  Completed        Plan:    I have personally reviewed and addressed the Medicare Annual Wellness questionnaire and have noted the following in the patient's chart:  A. Medical and social history B. Use of alcohol, tobacco or illicit drugs  C. Current medications and supplements D. Functional ability and status E.  Nutritional status F.  Physical activity G. Advance directives H. List of other physicians I.  Hospitalizations, surgeries, and ER visits in previous 12 months J.  [place] to include hearing, vision, cognitive, depression L. Referrals and appointments - none  In addition, I have reviewed and discussed with patient certain preventive protocols, quality metrics, and best practice recommendations. A written personalized care plan for preventive services as well as general preventive health recommendations were provided to patient.  See attached scanned questionnaire for additional information.   Signed,   Allyn Kenner, LPN Health Advisor   I have reviewed the information entered by the Health Advisor. I was present in the office during the time of patient interaction and was available for consultation. I agree with the documentation and advice.  Viviann Spare Nyoka Cowden, MD

## 2016-05-22 DIAGNOSIS — L578 Other skin changes due to chronic exposure to nonionizing radiation: Secondary | ICD-10-CM | POA: Diagnosis not present

## 2016-06-05 ENCOUNTER — Encounter: Payer: Self-pay | Admitting: Internal Medicine

## 2016-06-05 ENCOUNTER — Ambulatory Visit (INDEPENDENT_AMBULATORY_CARE_PROVIDER_SITE_OTHER): Payer: Medicare Other | Admitting: Internal Medicine

## 2016-06-05 VITALS — BP 128/70 | HR 64 | Temp 98.0°F | Resp 20 | Ht 69.0 in | Wt 180.6 lb

## 2016-06-05 DIAGNOSIS — N401 Enlarged prostate with lower urinary tract symptoms: Secondary | ICD-10-CM

## 2016-06-05 DIAGNOSIS — I10 Essential (primary) hypertension: Secondary | ICD-10-CM

## 2016-06-05 DIAGNOSIS — E039 Hypothyroidism, unspecified: Secondary | ICD-10-CM

## 2016-06-05 DIAGNOSIS — G301 Alzheimer's disease with late onset: Secondary | ICD-10-CM | POA: Diagnosis not present

## 2016-06-05 DIAGNOSIS — R3914 Feeling of incomplete bladder emptying: Secondary | ICD-10-CM

## 2016-06-05 DIAGNOSIS — E782 Mixed hyperlipidemia: Secondary | ICD-10-CM | POA: Diagnosis not present

## 2016-06-05 DIAGNOSIS — M7550 Bursitis of unspecified shoulder: Secondary | ICD-10-CM

## 2016-06-05 DIAGNOSIS — E038 Other specified hypothyroidism: Secondary | ICD-10-CM

## 2016-06-05 DIAGNOSIS — F3342 Major depressive disorder, recurrent, in full remission: Secondary | ICD-10-CM | POA: Diagnosis not present

## 2016-06-05 DIAGNOSIS — F0280 Dementia in other diseases classified elsewhere without behavioral disturbance: Secondary | ICD-10-CM | POA: Diagnosis not present

## 2016-06-05 DIAGNOSIS — F5101 Primary insomnia: Secondary | ICD-10-CM

## 2016-06-05 MED ORDER — LEVOTHYROXINE SODIUM 75 MCG PO TABS: 75.0000 ug | ORAL_TABLET | Freq: Every day | ORAL | 2 refills | Status: DC

## 2016-06-05 MED ORDER — ZOLPIDEM TARTRATE 10 MG PO TABS: 10.0000 mg | ORAL_TABLET | Freq: Every day | ORAL | 0 refills | Status: DC

## 2016-06-05 MED ORDER — SERTRALINE HCL 50 MG PO TABS: 50.0000 mg | ORAL_TABLET | Freq: Every day | ORAL | 3 refills | Status: DC

## 2016-06-05 MED ORDER — MIRABEGRON ER 50 MG PO TB24: ORAL_TABLET | ORAL | 3 refills | Status: DC

## 2016-06-05 NOTE — Progress Notes (Signed)
Facility  [facility]    Place of Service:   OFFICE    Allergies  Allergen Reactions  . Iodine Swelling    Internal iodine  . Iohexol Hives         Chief Complaint  Patient presents with  . Medical Management of Chronic Issues    6 month routine follow up on BJP    HPI:  Essential hypertension, benign - controlled  Bursitis, subacromial - painful area in the left shoulder that was seen and evaluated by Dr. Myrtha Mantis. MRI was done and shoed biceps tendon tear and subacromial bursitis. He believes the initial injury was when he was lifting weights.  Late onset Alzheimer's disease without behavioral disturbance - pattern of memory loss is atypical for Alzheimer's.   Hypothyroidism, unspecified type - compensated  Mixed hyperlipidemia - controlled  Benign prostatic hyperplasia with incomplete bladder emptying - has been using Myrbetriq, but he says it is not as effective as when he initially started taking it. Would like higher strength.. Sinus congestion. Using a Nasal Cleanse to was nostrils. May need to use steroid spray.    Medications: Patient's Medications  New Prescriptions   No medications on file  Previous Medications   ALPRAZOLAM (XANAX) 0.5 MG TABLET    TAKE ONE TABLET BY MOUTH TWICE DAILY AS NEEDED   ASPIRIN EC 81 MG TABLET    Take 81 mg by mouth at bedtime.   B COMPLEX VITAMINS TABLET    Take 1 tablet by mouth daily.   CETIRIZINE (ZYRTEC) 10 MG TABLET    Take 1 tablet (10 mg total) by mouth daily.   CHOLECALCIFEROL (VITAMIN D) 1000 UNITS TABLET    Take 1,000 Units by mouth daily.   DILTIAZEM (CARDIZEM CD) 240 MG 24 HR CAPSULE    Take 1 capsule (240 mg total) by mouth daily.   GALANTAMINE (RAZADYNE ER) 24 MG 24 HR CAPSULE    TAKE ONE CAPSULE EVERY DAY WITH BREAKFAST   ISOSORBIDE MONONITRATE (IMDUR) 30 MG 24 HR TABLET    TAKE ONE TABLET BY MOUTH ONCE DAILY   LEVOTHYROXINE (SYNTHROID, LEVOTHROID) 75 MCG TABLET    TAKE ONE TABLET BY MOUTH ONCE DAILY   LOVASTATIN (MEVACOR) 40 MG TABLET    TAKE ONE TABLET BY MOUTH ONCE DAILY AT BEDTIME AS NEEDED FOR CHOLESTEROL   MEMANTINE (NAMENDA) 10 MG TABLET    Take 10 mg by mouth 2 (two) times daily.   MULTIPLE VITAMIN (MULTIVITAMIN WITH MINERALS) TABS TABLET    Take 1 tablet by mouth daily.   MYRBETRIQ 25 MG TB24 TABLET    Take 25 mg by mouth daily.   NITROGLYCERIN (NITROSTAT) 0.4 MG SL TABLET    One under the tongue if needed for chest tightness. Repeat in 5 minutes if needed. Repeat again in 5 minutes if needed.   SERTRALINE (ZOLOFT) 50 MG TABLET    Take 1 tablet (50 mg total) by mouth daily.   TAMSULOSIN (FLOMAX) 0.4 MG CAPS CAPSULE    TAKE ONE CAPSULE BY MOUTH ONCE DAILY TO HELP RELIEVE OBSTRUCTION   TRAZODONE (DESYREL) 100 MG TABLET    Take one tablet by mouth once at bedtime to help rest   ZINC GLUCONATE 50 MG TABLET    Take 50 mg by mouth at bedtime.   ZOLPIDEM (AMBIEN) 10 MG TABLET    TAKE ONE TABLET BY MOUTH AT BEDTIME  Modified Medications   No medications on file  Discontinued Medications   DOCUSATE SODIUM (COLACE) 100  MG CAPSULE    Take 100 mg by mouth daily.    FINASTERIDE (PROSCAR) 5 MG TABLET    Take 1 tablet (5 mg total) by mouth daily. For prostate   NAMENDA XR 28 MG CP24 24 HR CAPSULE    TAKE ONE CAPSULE BY MOUTH ONCE DAILY    Review of Systems  Constitutional: Negative for activity change, appetite change, chills, fatigue and fever.  HENT: Positive for sinus pressure. Negative for congestion.   Eyes: Negative.   Respiratory: Negative for cough and shortness of breath.   Cardiovascular: Negative for chest pain, palpitations and leg swelling.  Gastrointestinal: Negative for abdominal distention, abdominal pain, constipation, nausea and vomiting.  Genitourinary: Negative for difficulty urinating and dysuria.  Musculoskeletal: Negative for arthralgias and myalgias.       Left shoulder pains.  Skin: Negative for color change and wound.  Neurological: Positive for weakness. Negative  for dizziness, light-headedness and headaches.  Psychiatric/Behavioral: Positive for confusion.    Vitals:   06/05/16 1335  BP: 128/70  Pulse: 64  Resp: 20  Temp: 98 F (36.7 C)  SpO2: 95%  Weight: 180 lb 9.6 oz (81.9 kg)  Height: 5' 9"  (1.753 m)   Body mass index is 26.67 kg/m. Wt Readings from Last 3 Encounters:  06/05/16 180 lb 9.6 oz (81.9 kg)  05/21/16 179 lb 12.8 oz (81.6 kg)  01/18/16 172 lb 6.4 oz (78.2 kg)      Physical Exam  Constitutional: He is oriented to person, place, and time. He appears well-developed and well-nourished.  HENT:  Head: Normocephalic and atraumatic.  Right Ear: External ear normal.  Left Ear: External ear normal.  Nose: Nose normal. Right sinus exhibits no frontal sinus tenderness. Left sinus exhibits no frontal sinus tenderness.  Mouth/Throat: Oropharynx is clear and moist and mucous membranes are normal. No oropharyngeal exudate.  Canals clear  Eyes: Conjunctivae and EOM are normal. Pupils are equal, round, and reactive to light.  Neck: Normal range of motion. Neck supple.  Cardiovascular: Normal rate and regular rhythm.   Murmur heard. no distal LE swelling  Pulmonary/Chest: Effort normal and breath sounds normal. No respiratory distress. He has no wheezes. He has no rhonchi.  Abdominal: Soft. Bowel sounds are normal. He exhibits no abdominal bruit and no pulsatile midline mass. There is no CVA tenderness.  Musculoskeletal: He exhibits no edema.  Neurological: He is alert and oriented to person, place, and time.  11/29/15 MMSE 30/30. Passed clock drawing  Skin: Skin is warm and dry.  Psychiatric: His behavior is normal. Judgment and thought content normal. His affect is not blunt. He does not exhibit a depressed mood.    Labs reviewed: Lab Summary Latest Ref Rng & Units 11/29/2015 11/27/2015 12/29/2014  Hemoglobin 13.2 - 17.1 g/dL 14.5 (None) 14.7  Hematocrit 38.5 - 50.0 % 43.2 (None) 44.1  White count 3.8 - 10.8 K/uL 6.2 (None) 7.1    Platelet count 140 - 400 K/uL 206 (None) 214  Sodium 135 - 146 mmol/L (None) 138 138  Potassium 3.5 - 5.3 mmol/L (None) 4.0 4.7  Calcium 8.6 - 10.3 mg/dL (None) 9.3 9.8  Phosphorus - (None) (None) (None)  Creatinine 0.70 - 1.18 mg/dL (None) 1.26(H) 1.25  AST 10 - 35 U/L (None) 23 30  Alk Phos 40 - 115 U/L (None) 93 105  Bilirubin 0.2 - 1.2 mg/dL (None) 1.0 0.9  Glucose 65 - 99 mg/dL (None) 87 101(H)  Cholesterol 125 - 200 mg/dL (None) 175 (None)  HDL  cholesterol >=40 mg/dL (None) 73 (None)  Triglycerides <150 mg/dL (None) 142 (None)  LDL Direct - (None) (None) (None)  LDL Calc <130 mg/dL (None) 74 (None)  Total protein 6.1 - 8.1 g/dL (None) 6.3 (None)  Albumin 3.6 - 5.1 g/dL (None) 4.3 4.8  Some recent data might be hidden   Lab Results  Component Value Date   TSH 1.66 11/29/2015   TSH 2.030 12/29/2014   TSH 2.740 02/15/2014   Lab Results  Component Value Date   BUN 12 11/27/2015   BUN 11 12/29/2014   BUN 12 02/15/2014   No results found for: HGBA1C  Assessment/Plan  1. Bursitis, subacromial Continue to see Dr. French Ana  2. Essential hypertension, benign The current medical regimen is effective;  continue present plan and medications.  3. Late onset Alzheimer's disease without behavioral disturbance -donepezil 10 mg bid - memantine (NAMENDA) 10 MG tablet; Take 10 mg by mouth 2 (two) times daily.  4. Hypothyroidism, unspecified type - levothyroxine (SYNTHROID, LEVOTHROID) 75 MCG tablet; Take 1 tablet (75 mcg total) by mouth daily.  Dispense: 90 tablet; Refill: 2  5. Mixed hyperlipidemia controlled  6. Benign prostatic hyperplasia with incomplete bladder emptying - mirabegron ER (MYRBETRIQ) 50 MG TB24 tablet; One daily to help bladder control  Dispense: 90 tablet; Refill: 3  7. Recurrent major depressive disorder, in full remission ([place]) - sertraline (ZOLOFT) 50 MG tablet; Take 1 tablet (50 mg total) by mouth daily.  Dispense: 90 tablet; Refill: 3  8. Other  specified hypothyroidism compensated  9. Primary insomnia - zolpidem (AMBIEN) 10 MG tablet; Take 1 tablet (10 mg total) by mouth at bedtime.  Dispense: 30 tablet; Refill: 0

## 2016-06-25 ENCOUNTER — Ambulatory Visit (INDEPENDENT_AMBULATORY_CARE_PROVIDER_SITE_OTHER): Payer: Medicare Other | Admitting: Nurse Practitioner

## 2016-06-25 ENCOUNTER — Encounter: Payer: Self-pay | Admitting: Nurse Practitioner

## 2016-06-25 ENCOUNTER — Other Ambulatory Visit: Payer: Self-pay | Admitting: Internal Medicine

## 2016-06-25 VITALS — BP 128/78 | HR 74 | Temp 98.0°F | Resp 18 | Ht 69.0 in | Wt 179.4 lb

## 2016-06-25 DIAGNOSIS — F5104 Psychophysiologic insomnia: Secondary | ICD-10-CM

## 2016-06-25 DIAGNOSIS — R197 Diarrhea, unspecified: Secondary | ICD-10-CM

## 2016-06-25 DIAGNOSIS — L853 Xerosis cutis: Secondary | ICD-10-CM | POA: Diagnosis not present

## 2016-06-25 LAB — COMPLETE METABOLIC PANEL WITH GFR
ALBUMIN: 4.5 g/dL (ref 3.6–5.1)
ALK PHOS: 94 U/L (ref 40–115)
ALT: 19 U/L (ref 9–46)
AST: 21 U/L (ref 10–35)
BILIRUBIN TOTAL: 0.8 mg/dL (ref 0.2–1.2)
BUN: 15 mg/dL (ref 7–25)
CO2: 28 mmol/L (ref 20–31)
CREATININE: 1.39 mg/dL — AB (ref 0.70–1.18)
Calcium: 9.7 mg/dL (ref 8.6–10.3)
Chloride: 102 mmol/L (ref 98–110)
GFR, EST AFRICAN AMERICAN: 55 mL/min — AB (ref >=60)
GFR, EST NON AFRICAN AMERICAN: 48 mL/min — AB (ref >=60)
Glucose, Bld: 90 mg/dL (ref 65–99)
Potassium: 4.8 mmol/L (ref 3.5–5.3)
Sodium: 140 mmol/L (ref 135–146)
TOTAL PROTEIN: 6.8 g/dL (ref 6.1–8.1)

## 2016-06-25 LAB — CBC WITH DIFFERENTIAL/PLATELET
BASOS ABS: 0 {cells}/uL (ref 0–200)
Basophils Relative: 0 %
EOS ABS: 336 {cells}/uL (ref 15–500)
Eosinophils Relative: 6 %
HEMATOCRIT: 47.5 % (ref 38.5–50.0)
Hemoglobin: 16.1 g/dL (ref 13.2–17.1)
LYMPHS ABS: 1120 {cells}/uL (ref 850–3900)
Lymphocytes Relative: 20 %
MCH: 32.1 pg (ref 27.0–33.0)
MCHC: 33.9 g/dL (ref 32.0–36.0)
MCV: 94.8 fL (ref 80.0–100.0)
MONO ABS: 336 {cells}/uL (ref 200–950)
MPV: 10.5 fL (ref 7.5–12.5)
Monocytes Relative: 6 %
NEUTROS ABS: 3808 {cells}/uL (ref 1500–7800)
Neutrophils Relative %: 68 %
Platelets: 210 10*3/uL (ref 140–400)
RBC: 5.01 MIL/uL (ref 4.20–5.80)
RDW: 13.2 % (ref 11.0–15.0)
WBC: 5.6 10*3/uL (ref 3.8–10.8)

## 2016-06-25 LAB — AMYLASE: Amylase: 78 U/L (ref 0–105)

## 2016-06-25 LAB — LIPASE: LIPASE: 21 U/L (ref 7–60)

## 2016-06-25 NOTE — Patient Instructions (Signed)
Increase water- drink at least 6 glasses a day If you are having diarrhea recommend drinking a low sugar sports drink- can also dilute this with water to help get your water in  Cont to use Vaseline to face- use three times daily for next few days then twice daily for several days  To use bene-fiber or metamucil 1-2 times daily for diarrhea

## 2016-06-25 NOTE — Progress Notes (Signed)
Careteam: Patient Care Team: Estill Dooms, MD as PCP - General (Internal Medicine) Clent Jacks, MD as Consulting Physician (Ophthalmology) Collene Gobble, MD as Consulting Physician (Pulmonary Disease) Angelia Mould, MD as Consulting Physician (Vascular Surgery) Earlie Server, MD as Consulting Physician (Orthopedic Surgery) Wilford Corner, MD as Consulting Physician (Gastroenterology) Josue Hector, MD as Consulting Physician (Cardiology) Chesley Mires, MD as Consulting Physician (Pulmonary Disease)  Advanced Directive information Does Patient Have a Medical Advance Directive?: Yes, Type of Advance Directive: [place];Living will  Allergies  Allergen Reactions  . Iodine Swelling    Internal iodine  . Iohexol Hives         Chief Complaint  Patient presents with  . Acute Visit    Pt is being seen for nasal congestion and rash/irritation on right side of face x 1 week. Loose stool/chill for 3 days.      HPI: Patient is a 82 y.o. male seen in the office today due to not feeling well. Just went to [place], got back 2 days.  Reports 1 week started to have dry eyes and then breaking out on side of his face. Similar to chapped lips.  No nasal congestion or runny nose. No itchy eyes   No fevers but having chills and diarrhea for last 3 days.  No changes to medication Diet has changed due to being away from home. Does not eat a lot of fiber.  Decrease in appetite.  No abdomina pain. No N/V   Review of Systems:  Review of Systems  Constitutional: Negative for activity change, appetite change, chills, fatigue and fever.  HENT: Negative for congestion, postnasal drip, rhinorrhea, sinus pressure and sore throat.   Eyes: Negative.  Negative for discharge and itching.  Respiratory: Negative for cough, shortness of breath and wheezing.   Cardiovascular: Negative for chest pain and palpitations.  Gastrointestinal: Positive for diarrhea. Negative for  abdominal distention, abdominal pain, anal bleeding, blood in stool, constipation, nausea, rectal pain and vomiting.  Skin:       Dry skin to face  Allergic/Immunologic: Positive for environmental allergies.    Past Medical History:  Diagnosis Date  . Actinic keratosis   . Alzheimer's disease   . Anal fissure   . Anxiety state, unspecified   . Benign neoplasm of colon   . BENIGN PROSTATIC HYPERTROPHY, HX OF   . BPH (benign prostatic hypertrophy) with urinary obstruction 06/22/2008   Qualifier: Diagnosis of  By: Johnsie Cancel, MD, Rona Ravens   . Cerebrovascular disease, unspecified   . Coronary atherosclerosis of native coronary artery   . Depressive disorder, not elsewhere classified   . Diverticulosis of colon (without mention of hemorrhage)   . Essential hypertension, benign   . External hemorrhoids without mention of complication   . Insomnia, unspecified   . Mixed hyperlipidemia   . Osteoarthrosis, unspecified whether generalized or localized, unspecified site   . Other premature beats   . Other psoriasis   . Pain in joint, lower leg   . Pain in joint, site unspecified   . Palpitations   . Pressure ulcer   . Reflux esophagitis   . Unspecified essential hypertension   . Unspecified hereditary and idiopathic peripheral neuropathy   . Unspecified hypothyroidism   . Unspecified vitamin D deficiency    Past Surgical History:  Procedure Laterality Date  . CERVICAL DISC SURGERY    . EYE SURGERY Left 02/07/2014   Cataract Surgery Left Eye  . KNEE  SURGERY  07/18/2006   Dr French Ana   . LAMINECTOMY C3 disc  1972  . LEFT HEART CATHETERIZATION WITH CORONARY ANGIOGRAM N/A 01/28/2013   Procedure: LEFT HEART CATHETERIZATION WITH CORONARY ANGIOGRAM;  Surgeon: Josue Hector, MD;  Location: [facility] CATH LAB;  Service: Cardiovascular;  Laterality: N/A;  . MULTIPLE TOOTH EXTRACTIONS  2014  . RETINAL TEAR REPAIR CRYOTHERAPY     Dr Wayne Sever    Social History:   reports that he quit smoking  about 24 years ago. His smoking use included Cigarettes. He has a 50.00 pack-year smoking history. He has never used smokeless tobacco. He reports that he drinks alcohol. He reports that he does not use drugs.  Family History  Problem Relation Age of Onset  . Cancer Father     pancreatic  . Cancer Mother     pancreatic    Medications: Patient's Medications  New Prescriptions   No medications on file  Previous Medications   ALPRAZOLAM (XANAX) 0.5 MG TABLET    TAKE ONE TABLET BY MOUTH TWICE DAILY AS NEEDED   ASPIRIN EC 81 MG TABLET    Take 81 mg by mouth at bedtime.   B COMPLEX VITAMINS TABLET    Take 1 tablet by mouth daily.   CETIRIZINE (ZYRTEC) 10 MG TABLET    Take 1 tablet (10 mg total) by mouth daily.   CHOLECALCIFEROL (VITAMIN D) 1000 UNITS TABLET    Take 1,000 Units by mouth daily.   DILTIAZEM (CARDIZEM CD) 240 MG 24 HR CAPSULE    Take 1 capsule (240 mg total) by mouth daily.   GALANTAMINE (RAZADYNE ER) 24 MG 24 HR CAPSULE    TAKE ONE CAPSULE EVERY DAY WITH BREAKFAST   ISOSORBIDE MONONITRATE (IMDUR) 30 MG 24 HR TABLET    TAKE ONE TABLET BY MOUTH ONCE DAILY   LEVOTHYROXINE (SYNTHROID, LEVOTHROID) 75 MCG TABLET    Take 1 tablet (75 mcg total) by mouth daily.   LOVASTATIN (MEVACOR) 40 MG TABLET    TAKE ONE TABLET BY MOUTH ONCE DAILY AT BEDTIME AS NEEDED FOR CHOLESTEROL   MEMANTINE (NAMENDA) 10 MG TABLET    Take 10 mg by mouth 2 (two) times daily.   MIRABEGRON ER (MYRBETRIQ) 50 MG TB24 TABLET    One daily to help bladder control   MULTIPLE VITAMIN (MULTIVITAMIN WITH MINERALS) TABS TABLET    Take 1 tablet by mouth daily.   NITROGLYCERIN (NITROSTAT) 0.4 MG SL TABLET    One under the tongue if needed for chest tightness. Repeat in 5 minutes if needed. Repeat again in 5 minutes if needed.   SERTRALINE (ZOLOFT) 50 MG TABLET    Take 1 tablet (50 mg total) by mouth daily.   TAMSULOSIN (FLOMAX) 0.4 MG CAPS CAPSULE    TAKE ONE CAPSULE BY MOUTH ONCE DAILY TO HELP RELIEVE OBSTRUCTION   TRAZODONE  (DESYREL) 100 MG TABLET    Take one tablet by mouth once at bedtime to help rest   ZINC GLUCONATE 50 MG TABLET    Take 50 mg by mouth at bedtime.   ZOLPIDEM (AMBIEN) 10 MG TABLET    Take 1 tablet (10 mg total) by mouth at bedtime.  Modified Medications   No medications on file  Discontinued Medications   No medications on file     Physical Exam:  Vitals:   06/25/16 1057  BP: 128/78  Pulse: 74  Resp: 18  Temp: 98 F (36.7 C)  TempSrc: Oral  SpO2: 96%  Weight: 179 lb 6.4  oz (81.4 kg)  Height: _0  (1.753 m)   Body mass index is 26.49 kg/m.  Physical Exam  Constitutional: He is oriented to person, place, and time. He appears well-developed and well-nourished.  HENT:  Head: Normocephalic and atraumatic.  Nose: Right sinus exhibits no frontal sinus tenderness. Left sinus exhibits no frontal sinus tenderness.  Mouth/Throat: Mucous membranes are normal.  Eyes: Conjunctivae and EOM are normal. Pupils are equal, round, and reactive to light.  Neck: Normal range of motion. Neck supple.  Cardiovascular: Normal rate and regular rhythm.   Murmur heard. no distal LE swelling  Pulmonary/Chest: Effort normal and breath sounds normal. No respiratory distress. He has no wheezes. He has no rhonchi.  Abdominal: Soft. Bowel sounds are normal. He exhibits no distension, no abdominal bruit, no pulsatile midline mass and no mass. There is no tenderness. There is no rebound, no guarding and no CVA tenderness.  Musculoskeletal: He exhibits no edema.  Neurological: He is alert and oriented to person, place, and time.  11/29/15 MMSE 30/30. Passed clock drawing  Skin: Skin is warm and dry.  Redness noted to right cheek with dry patches  Psychiatric: His behavior is normal. Judgment and thought content normal. His affect is not blunt. He does not exhibit a depressed mood.    Labs reviewed: Basic Metabolic Panel:  Recent Labs  11/27/15 1049 11/29/15 1507  NA 138  --   K 4.0  --   CL 103  --    CO2 27  --   GLUCOSE 87  --   BUN 12  --   CREATININE 1.26*  --   CALCIUM 9.3  --   TSH  --  1.66   Liver Function Tests:  Recent Labs  11/27/15 1049  AST 23  ALT 19  ALKPHOS 93  BILITOT 1.0  PROT 6.3  ALBUMIN 4.3   No results for input(s): LIPASE, AMYLASE in the last 8760 hours. No results for input(s): AMMONIA in the last 8760 hours. CBC:  Recent Labs  11/29/15 1507  WBC 6.2  NEUTROABS 4,588  HGB 14.5  HCT 43.2  MCV 95.6  PLT 206   Lipid Panel:  Recent Labs  11/27/15 1049  CHOL 175  HDL 73  LDLCALC 74  TRIG 142  CHOLHDL 2.4   TSH:  Recent Labs  11/29/15 1507  TSH 1.66   A1C: No results found for: HGBA1C   Assessment/Plan 1. Diarrhea, unspecified type Ongoing diarrhea, may be related to travel but overall feels poorly. Will get lab work and pt to increase fiber and increase hydration.  To notify if symptoms persist longer than 1 week or worsen, N/V occur or fever  - CBC with Differential/Platelets - CMP with eGFR - Amylase - Lipase  2. Dry skin Vaseline effective, to cont to use this TID and PRN  To increase water intake to 6 8 oz glasses of water a day.  Phillip Larson. Harle Battiest  [facility] [phone] 8 am - 5 pm) [phone] (after hours)

## 2016-07-02 DIAGNOSIS — L578 Other skin changes due to chronic exposure to nonionizing radiation: Secondary | ICD-10-CM | POA: Diagnosis not present

## 2016-07-02 DIAGNOSIS — L309 Dermatitis, unspecified: Secondary | ICD-10-CM | POA: Diagnosis not present

## 2016-07-04 ENCOUNTER — Other Ambulatory Visit: Payer: Self-pay | Admitting: Internal Medicine

## 2016-07-04 DIAGNOSIS — F5101 Primary insomnia: Secondary | ICD-10-CM

## 2016-07-31 ENCOUNTER — Other Ambulatory Visit: Payer: Self-pay | Admitting: Dermatology

## 2016-07-31 DIAGNOSIS — L578 Other skin changes due to chronic exposure to nonionizing radiation: Secondary | ICD-10-CM | POA: Diagnosis not present

## 2016-07-31 DIAGNOSIS — D485 Neoplasm of uncertain behavior of skin: Secondary | ICD-10-CM | POA: Diagnosis not present

## 2016-07-31 DIAGNOSIS — L570 Actinic keratosis: Secondary | ICD-10-CM | POA: Diagnosis not present

## 2016-08-07 ENCOUNTER — Other Ambulatory Visit: Payer: Self-pay | Admitting: Internal Medicine

## 2016-08-07 DIAGNOSIS — F5101 Primary insomnia: Secondary | ICD-10-CM

## 2016-08-12 DIAGNOSIS — L309 Dermatitis, unspecified: Secondary | ICD-10-CM | POA: Diagnosis not present

## 2016-08-29 NOTE — Progress Notes (Signed)
Patient ID: Phillip Larson, male   DOB: [birth year], 82 y.o.   MRN: [id number]   Mattison is seen today for followup of his blood pressure and CAD .[name] He has been more active. He is watching the salt in his diet. He also has hypercholesterolemia  He has CAD with a total RCA that is collaterized. He had a low risk myovue in 2009 .   Cath done 01/28/13 from right radial approach and no changes with chronically occluded RCA , robust collaterals and no significant left sided disease   Playing tennis 3x/week no chest pain   Sees Art Green as primary  Subacromial bursitis left shoulder sees [facility]  Alzheimers's started on aricept and namenda  Complains of some fatigue and malaise   ROS: Denies fever, malais, weight loss, blurry vision, decreased visual acuity, cough, sputum, SOB, hemoptysis, pleuritic pain, palpitaitons, heartburn, abdominal pain, melena, lower extremity edema, claudication, or rash.  All other systems reviewed and negative  General: BP 118/60   Pulse (!) 52   Ht 5\' 10"  (1.778 m)   Wt 180 lb (81.6 kg)   BMI 25.83 kg/m  Affect appropriate Healthy:  appears stated age 18: normal Neck supple with no adenopathy JVP normal no bruits no thyromegaly Lungs clear with no wheezing and good diaphragmatic motion Heart:  S1/S2 apical MR  murmur, no rub, gallop or click PMI normal Abdomen: benighn, BS positve, no tenderness, no AAA no bruit.  No HSM or HJR Distal pulses intact with no bruits No edema Neuro non-focal Skin warm and dry Limited ROM in left shoulder     Current Outpatient Prescriptions  Medication Sig Dispense Refill  . ALPRAZolam (XANAX) 0.5 MG tablet TAKE ONE TABLET BY MOUTH TWICE DAILY AS NEEDED 60 tablet 0  . aspirin EC 81 MG tablet Take 81 mg by mouth at bedtime.    [name] b complex vitamins tablet Take 1 tablet by mouth daily.    . cetirizine (ZYRTEC) 10 MG tablet Take 1 tablet (10 mg total) by mouth daily. 30 tablet 11  . cholecalciferol (VITAMIN D) 1000  UNITS tablet Take 1,000 Units by mouth daily.    [name] diltiazem (CARDIZEM CD) 240 MG 24 hr capsule Take 1 capsule (240 mg total) by mouth daily. 90 capsule 3  . galantamine (RAZADYNE ER) 24 MG 24 hr capsule TAKE ONE CAPSULE EVERY DAY WITH BREAKFAST 30 capsule 5  . isosorbide mononitrate (IMDUR) 30 MG 24 hr tablet TAKE ONE TABLET BY MOUTH ONCE DAILY 90 tablet 1  . levothyroxine (SYNTHROID, LEVOTHROID) 75 MCG tablet Take 1 tablet (75 mcg total) by mouth daily. 90 tablet 2  . lovastatin (MEVACOR) 40 MG tablet TAKE ONE TABLET BY MOUTH ONCE DAILY AT BEDTIME AS NEEDED FOR CHOLESTEROL 90 tablet 2  . memantine (NAMENDA) 10 MG tablet Take 10 mg by mouth 2 (two) times daily.    . mirabegron ER (MYRBETRIQ) 50 MG TB24 tablet One daily to help bladder control 90 tablet 3  . Multiple Vitamin (MULTIVITAMIN WITH MINERALS) TABS tablet Take 1 tablet by mouth daily.    . nitroGLYCERIN (NITROSTAT) 0.4 MG SL tablet One under the tongue if needed for chest tightness. Repeat in 5 minutes if needed. Repeat again in 5 minutes if needed. 25 tablet 12  . sertraline (ZOLOFT) 50 MG tablet Take 1 tablet (50 mg total) by mouth daily. 90 tablet 3  . tamsulosin (FLOMAX) 0.4 MG CAPS capsule TAKE ONE CAPSULE BY MOUTH ONCE DAILY TO HELP RELIEVE OBSTRUCTION 90 capsule  1  . traZODone (DESYREL) 100 MG tablet TAKE ONE TABLET BY MOUTH AT BEDTIME TO  HELP  REST 90 tablet 1  . zinc gluconate 50 MG tablet Take 50 mg by mouth at bedtime.    [name] zolpidem (AMBIEN) 10 MG tablet TAKE 1 TABLET BY MOUTH AT BEDTIME 30 tablet 0   No current facility-administered medications for this visit.     Allergies  Iodine and Iohexol  Electrocardiogram:   03/08/14  SR rate 63  Old IMI  No change from 2014  09/14/15  SR rate 27 old IMI poor R Wave progression  09/02/16 SB rate 20 possible old IMI PVC   Assessment and Plan CAD:  Cath done 01/28/13 from right radial approach and no changes with chronically occluded RCA , robust collaterals and no significant left  sided diseaseStable with no angina and good activity level.  Continue medical Rx  Chronic RCA occlusion with collaterals no hemodynically significant left sided disease cath 2015  Chol:  On statin Cholesterol is at goal.  Continue current dose of statin and diet Rx.  No myalgias or side effects.  F/U  LFT's in 6 months. Lab Results  Component Value Date   [place] 74 11/27/2015   labs with primary   Dementia:  Started on namenda and aricept f/u Dr Nyoka Cowden  HTN:  Well controlled.  Continue current medications and low sodium Dash type diet.    Prostate:  On proscar  Lab Results  Component Value Date   PSA 0.7 11/27/2015     Thyroid:  On replacement  Lab Results  Component Value Date   TSH 1.66 11/29/2015    Bursitis:  Left shoulder f/u [facility]   Fatigue/Murmur:  MR murmur and fatigue check echo to assess EF and degree MR         Jenkins Rouge, MD

## 2016-09-02 ENCOUNTER — Ambulatory Visit (INDEPENDENT_AMBULATORY_CARE_PROVIDER_SITE_OTHER): Payer: Medicare Other | Admitting: Cardiovascular Disease

## 2016-09-02 ENCOUNTER — Encounter: Payer: Self-pay | Admitting: Cardiovascular Disease

## 2016-09-02 ENCOUNTER — Encounter (INDEPENDENT_AMBULATORY_CARE_PROVIDER_SITE_OTHER): Payer: Self-pay

## 2016-09-02 VITALS — BP 118/60 | HR 52 | Ht 70.0 in | Wt 180.0 lb

## 2016-09-02 DIAGNOSIS — R011 Cardiac murmur, unspecified: Secondary | ICD-10-CM

## 2016-09-02 DIAGNOSIS — I2510 Atherosclerotic heart disease of native coronary artery without angina pectoris: Secondary | ICD-10-CM | POA: Diagnosis not present

## 2016-09-02 DIAGNOSIS — I10 Essential (primary) hypertension: Secondary | ICD-10-CM | POA: Diagnosis not present

## 2016-09-02 DIAGNOSIS — I2583 Coronary atherosclerosis due to lipid rich plaque: Secondary | ICD-10-CM | POA: Diagnosis not present

## 2016-09-02 DIAGNOSIS — R5383 Other fatigue: Secondary | ICD-10-CM | POA: Diagnosis not present

## 2016-09-02 MED ORDER — ISOSORBIDE MONONITRATE ER 30 MG PO TB24: 30.0000 mg | ORAL_TABLET | Freq: Every day | ORAL | 3 refills | Status: DC

## 2016-09-02 MED ORDER — DILTIAZEM HCL ER COATED BEADS 240 MG PO CP24: 240.0000 mg | ORAL_CAPSULE | Freq: Every day | ORAL | 3 refills | Status: DC

## 2016-09-02 NOTE — Patient Instructions (Addendum)

## 2016-09-04 ENCOUNTER — Other Ambulatory Visit: Payer: Self-pay | Admitting: *Deleted

## 2016-09-04 DIAGNOSIS — F5101 Primary insomnia: Secondary | ICD-10-CM

## 2016-09-04 MED ORDER — ZOLPIDEM TARTRATE 10 MG PO TABS: 10.0000 mg | ORAL_TABLET | Freq: Every day | ORAL | 0 refills | Status: DC

## 2016-09-04 NOTE — Telephone Encounter (Signed)
[facility] 

## 2016-09-11 DIAGNOSIS — L309 Dermatitis, unspecified: Secondary | ICD-10-CM | POA: Diagnosis not present

## 2016-09-13 ENCOUNTER — Ambulatory Visit (HOSPITAL_COMMUNITY): Payer: Medicare Other | Attending: Cardiology

## 2016-09-13 ENCOUNTER — Other Ambulatory Visit: Payer: Self-pay

## 2016-09-13 DIAGNOSIS — R011 Cardiac murmur, unspecified: Secondary | ICD-10-CM | POA: Diagnosis not present

## 2016-09-24 ENCOUNTER — Other Ambulatory Visit: Payer: Self-pay | Admitting: Internal Medicine

## 2016-10-02 ENCOUNTER — Other Ambulatory Visit: Payer: Self-pay | Admitting: Internal Medicine

## 2016-10-02 DIAGNOSIS — F5101 Primary insomnia: Secondary | ICD-10-CM

## 2016-10-25 ENCOUNTER — Other Ambulatory Visit: Payer: Self-pay | Admitting: Internal Medicine

## 2016-10-30 ENCOUNTER — Other Ambulatory Visit: Payer: Self-pay | Admitting: Internal Medicine

## 2016-10-30 DIAGNOSIS — F5101 Primary insomnia: Secondary | ICD-10-CM

## 2016-11-11 ENCOUNTER — Encounter: Payer: Self-pay | Admitting: Internal Medicine

## 2016-11-11 ENCOUNTER — Ambulatory Visit: Payer: Self-pay | Admitting: Medical

## 2016-11-11 NOTE — Telephone Encounter (Signed)
[facility] - Client TELEPHONE ADVICE RECORD [facility]  Patient Name: Phillip Larson  DOB: 02/11/1938    Initial Comment Caller states c/o blood in stool.   Nurse Assessment  Nurse: Hervey Ard, RN, [place] Date/Time (Eastern Time): 11/11/2016 11:13:20 AM  Confirm and document reason for call. If symptomatic, describe symptoms. ---Caller states that he has had a little blood spots on his underwear. No weakness and dizziness. Has had diarrhea for a couple of weeks. Is taking Elquis.  Does the patient have any new or worsening symptoms? ---Yes  Will a triage be completed? ---Yes  Related visit to physician within the last 2 weeks? ---No  Does the PT have any chronic conditions? (i.e. diabetes, asthma, etc.) ---Yes  List chronic conditions. ---HIV positive  Is this a behavioral health or substance abuse call? ---No     Guidelines    Guideline Title Affirmed Question Affirmed Notes  Diarrhea [1] MODERATE diarrhea (e.g., 4-6 times / day more than normal) AND [2] present > 48 hours (2 days)    Final Disposition User   See Physician within 24 Hours Sharp, RN, Jewel    Referrals  REFERRED TO PCP OFFICE  REFERRED TO PCP OFFICE   Disagree/Comply: Comply

## 2016-11-12 NOTE — Telephone Encounter (Signed)
This encounter was created in error - please disregard.

## 2016-11-12 NOTE — Addendum Note (Signed)
Addended by: Eduard Roux E on: 11/12/2016 01:42 PM   Modules accepted: Level of Service, SmartSet

## 2016-11-28 ENCOUNTER — Other Ambulatory Visit: Payer: Self-pay | Admitting: Internal Medicine

## 2016-11-28 DIAGNOSIS — F5101 Primary insomnia: Secondary | ICD-10-CM

## 2016-12-04 ENCOUNTER — Ambulatory Visit (INDEPENDENT_AMBULATORY_CARE_PROVIDER_SITE_OTHER): Payer: Medicare Other | Admitting: Internal Medicine

## 2016-12-04 ENCOUNTER — Encounter: Payer: Self-pay | Admitting: Internal Medicine

## 2016-12-04 ENCOUNTER — Other Ambulatory Visit: Payer: Self-pay | Admitting: Internal Medicine

## 2016-12-04 VITALS — BP 110/62 | HR 56 | Temp 98.2°F | Ht 70.0 in | Wt 180.0 lb

## 2016-12-04 DIAGNOSIS — S50311A Abrasion of right elbow, initial encounter: Secondary | ICD-10-CM | POA: Diagnosis not present

## 2016-12-04 DIAGNOSIS — F0280 Dementia in other diseases classified elsewhere without behavioral disturbance: Secondary | ICD-10-CM

## 2016-12-04 DIAGNOSIS — G301 Alzheimer's disease with late onset: Secondary | ICD-10-CM

## 2016-12-04 DIAGNOSIS — Z79899 Other long term (current) drug therapy: Secondary | ICD-10-CM | POA: Diagnosis not present

## 2016-12-04 DIAGNOSIS — E039 Hypothyroidism, unspecified: Secondary | ICD-10-CM | POA: Diagnosis not present

## 2016-12-04 DIAGNOSIS — J320 Chronic maxillary sinusitis: Secondary | ICD-10-CM | POA: Diagnosis not present

## 2016-12-04 DIAGNOSIS — E782 Mixed hyperlipidemia: Secondary | ICD-10-CM | POA: Diagnosis not present

## 2016-12-04 MED ORDER — ZOSTER VAC RECOMB ADJUVANTED 50 MCG/0.5ML IM SUSR: 0.5000 mL | Freq: Once | INTRAMUSCULAR | 1 refills | Status: AC

## 2016-12-04 MED ORDER — CEFUROXIME AXETIL 250 MG PO TABS: 250.0000 mg | ORAL_TABLET | Freq: Two times a day (BID) | ORAL | 0 refills | Status: DC

## 2016-12-04 NOTE — Progress Notes (Signed)
Patient ID: Phillip Larson, male   DOB: May 09, 1934, 82 y.o.   MRN: [id number]    Location:  [facility] Place of Service: OFFICE  Chief Complaint  Patient presents with  . Medical Management of Chronic Issues    6 month follow-up, Fell yesterday playing tennis (injured right elbow), patient also with pain in rib area on right side, yesterday. Sinus issues on left side of face and productive cough. Decreased appetitie and skips meals (forgets to eat).   . Medication Refill    No refills needed     HPI:  82 yo male seen today for f/u. He is a former pt of Dr Nyoka Cowden. He fell yesterday while playing tennis and scraped up right elbow. He cleaned it and applied iodine. No f/c. No d/c.    He has left sinus pressure with productive cough. Tried OTC saline nasal rinse with min relief. He has post nasal drip. No sore throat, HA or dizziness.   He noticed decreased appetite but "forgets" to eat. 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 Wife has noticed a slow decline in functional status. She is his primary caregiver. He is a poor historian due to dementia. Hx obtained from chart.  HTN/angina - stable on cardizem CD, imdur and prn SL NTG . He takes ASA daily for anticoagulation  Hypothyroidism - stable on levothyroxine. TSH 1.66  Insomnia - stable on ambien and trazodone  Anxiety/recurrent MDD -  Mood stable on sertraline and trazodone  BPH with lower urinary tract sx's - stable on myrbetriq and flomax  Hyperlipidemia - stable on lovastatin. LDL 74  Alzheimer's dementia - stable on razadyne ER and namenda  Allergic rhinitis - borderline controlled on cetirizine  CKD - stage  3. Cr 1.39   Past Medical History:  Diagnosis Date  . Actinic keratosis   . Alzheimer's disease   . Anal fissure   . Anxiety state, unspecified   . Benign neoplasm of colon   . BENIGN PROSTATIC HYPERTROPHY, HX OF   . BPH (benign prostatic hypertrophy) with urinary obstruction 06/22/2008   Qualifier: Diagnosis of   By: Johnsie Cancel, MD, Rona Ravens   . Cerebrovascular disease, unspecified   . Coronary atherosclerosis of native coronary artery   . Depressive disorder, not elsewhere classified   . Diverticulosis of colon (without mention of hemorrhage)   . Essential hypertension, benign   . External hemorrhoids without mention of complication   . Insomnia, unspecified   . Mixed hyperlipidemia   . Osteoarthrosis, unspecified whether generalized or localized, unspecified site   . Other premature beats   . Other psoriasis   . Pain in joint, lower leg   . Pain in joint, site unspecified   . Palpitations   . Pressure ulcer   . Reflux esophagitis   . Unspecified essential hypertension   . Unspecified hereditary and idiopathic peripheral neuropathy   . Unspecified hypothyroidism   . Unspecified vitamin D deficiency     Past Surgical History:  Procedure Laterality Date  . CERVICAL DISC SURGERY    . EYE SURGERY Left 02/07/2014   Cataract Surgery Left Eye  . KNEE SURGERY  07/18/2006   Dr French Ana   . LAMINECTOMY C3 disc  1972  . LEFT HEART CATHETERIZATION WITH CORONARY ANGIOGRAM N/A 01/28/2013   Procedure: LEFT HEART CATHETERIZATION WITH CORONARY ANGIOGRAM;  Surgeon: Josue Hector, MD;  Location: [facility] CATH LAB;  Service: Cardiovascular;  Laterality: N/A;  . MULTIPLE TOOTH EXTRACTIONS  2014  . RETINAL TEAR  REPAIR CRYOTHERAPY     Dr Wayne Sever     Patient Care Team: Gildardo Cranker, DO as PCP - General (Internal Medicine) Clent Jacks, MD as Consulting Physician (Ophthalmology) Lamonte Sakai Rose Fillers, MD as Consulting Physician (Pulmonary Disease) Angelia Mould, MD as Consulting Physician (Vascular Surgery) Earlie Server, MD as Consulting Physician (Orthopedic Surgery) Wilford Corner, MD as Consulting Physician (Gastroenterology) Josue Hector, MD as Consulting Physician (Cardiology) Chesley Mires, MD as Consulting Physician (Pulmonary Disease)  Social History   Social History  . Marital  status: Married    Spouse name: N/A  . Number of children: N/A  . Years of education: N/A   Occupational History  . retired    Social History Main Topics  . Smoking status: Former Smoker    Packs/day: 2.00    Years: 25.00    Types: Cigarettes    Quit date: 04/22/1992  . Smokeless tobacco: Never Used  . Alcohol use 0.0 oz/week     Comment: beer/wine/liquor daily  . Drug use: No  . Sexual activity: Not Currently   Other Topics Concern  . Not on file   Social History Narrative  . No narrative on file     reports that he quit smoking about 24 years ago. His smoking use included Cigarettes. He has a 50.00 pack-year smoking history. He has never used smokeless tobacco. He reports that he drinks alcohol. He reports that he does not use drugs.  Family History  Problem Relation Age of Onset  . Cancer Father        pancreatic  . Cancer Mother        pancreatic   Family Status  Relation Status  . Father Deceased  . Mother Deceased  . Daughter Alive  . Son Alive  . Daughter Alive  . Daughter Alive     Allergies  Allergen Reactions  . Iodine Swelling    Internal iodine  . Iohexol Hives         Medications: Patient's Medications  New Prescriptions   No medications on file  Previous Medications   ALPRAZOLAM (XANAX) 0.5 MG TABLET    TAKE 1 TABLET BY MOUTH TWICE DAILY AS NEEDED   ASPIRIN EC 81 MG TABLET    Take 81 mg by mouth at bedtime.   B COMPLEX VITAMINS TABLET    Take 1 tablet by mouth daily.   CETIRIZINE (ZYRTEC) 10 MG TABLET    Take 1 tablet (10 mg total) by mouth daily.   CHOLECALCIFEROL (VITAMIN D) 1000 UNITS TABLET    Take 1,000 Units by mouth daily.   DILTIAZEM (CARDIZEM CD) 240 MG 24 HR CAPSULE    Take 1 capsule (240 mg total) by mouth daily.   GALANTAMINE (RAZADYNE ER) 24 MG 24 HR CAPSULE    TAKE ONE CAPSULE EVERY DAY WITH BREAKFAST   ISOSORBIDE MONONITRATE (IMDUR) 30 MG 24 HR TABLET    Take 1 tablet (30 mg total) by mouth daily.   LEVOTHYROXINE  (SYNTHROID, LEVOTHROID) 75 MCG TABLET    Take 1 tablet (75 mcg total) by mouth daily.   LOVASTATIN (MEVACOR) 40 MG TABLET    TAKE ONE TABLET BY MOUTH ONCE DAILY AT BEDTIME AS NEEDED FOR CHOLESTEROL   MAGNESIUM PO    Take by mouth daily.   MEMANTINE (NAMENDA) 10 MG TABLET    Take 10 mg by mouth 2 (two) times daily.   MIRABEGRON ER (MYRBETRIQ) 50 MG TB24 TABLET    One daily to help bladder control  MULTIPLE VITAMIN (MULTIVITAMIN WITH MINERALS) TABS TABLET    Take 1 tablet by mouth daily.   MULTIPLE VITAMIN (MULTIVITAMIN) TABLET    Take 1 tablet by mouth daily.   NITROGLYCERIN (NITROSTAT) 0.4 MG SL TABLET    One under the tongue if needed for chest tightness. Repeat in 5 minutes if needed. Repeat again in 5 minutes if needed.   SERTRALINE (ZOLOFT) 50 MG TABLET    Take 1 tablet (50 mg total) by mouth daily.   TAMSULOSIN (FLOMAX) 0.4 MG CAPS CAPSULE    TAKE ONE CAPSULE BY MOUTH ONCE DAILY TO HELP RELIEVE OBSTRUCTION   TRAZODONE (DESYREL) 100 MG TABLET    TAKE ONE TABLET BY MOUTH AT BEDTIME TO  HELP  REST   ZINC GLUCONATE 50 MG TABLET    Take 50 mg by mouth at bedtime.   ZOLPIDEM (AMBIEN) 10 MG TABLET    TAKE 1 TABLET BY MOUTH ONCE DAILY AT BEDTIME  Modified Medications   Modified Medication Previous Medication   ZOSTER VAC RECOMB ADJUVANTED (SHINGRIX) INJECTION Zoster Vac Recomb Adjuvanted (SHINGRIX) injection      Inject 0.5 mLs into the muscle once.    Inject 0.5 mLs into the muscle once.  Discontinued Medications   No medications on file    Review of Systems  Unable to perform ROS: Dementia    Vitals:   12/04/16 1154  BP: 110/62  Pulse: (!) 56  Temp: 98.2 F (36.8 C)  TempSrc: Oral  SpO2: 95%  Weight: 180 lb (81.6 kg)  Height: 5' 10"  (1.778 m)   Body mass index is 25.83 kg/m.  Physical Exam  Constitutional: He appears well-developed and well-nourished.  HENT:  Mouth/Throat: Oropharynx is clear and moist.  TMs dull but intact; left maxillary sinus boggy tissue texture changes  but no TTP; nares with enlarged grey dry turbinates; oropharynx cobblestoning but no redness/exudate  Eyes: Pupils are equal, round, and reactive to light. No scleral icterus.  Neck: Neck supple. Carotid bruit is not present. No thyromegaly present.  Cardiovascular: Normal rate, regular rhythm and intact distal pulses.  Exam reveals no gallop and no friction rub.   Murmur (1/6 SEM) heard. No distal LE edema. No calf TTP  Pulmonary/Chest: Effort normal and breath sounds normal. He has no wheezes. He has no rales. He exhibits no tenderness.  Abdominal: Soft. Normal appearance and bowel sounds are normal. He exhibits no distension, no abdominal bruit, no pulsatile midline mass and no mass. There is no hepatomegaly. There is no tenderness. There is no rigidity, no rebound and no guarding. No hernia.  Musculoskeletal: He exhibits edema.  Lymphadenopathy:    He has no cervical adenopathy.  Neurological: He is alert.  Skin: Skin is warm and dry. No rash noted.     Psychiatric: He has a normal mood and affect. His behavior is normal. Thought content normal.     Labs reviewed: No visits with results within 3 Month(s) from this visit.  Latest known visit with results is:  Office Visit on 06/25/2016  Component Date Value Ref Range Status  . WBC 06/25/2016 5.6  3.8 - 10.8 K/uL Final  . RBC 06/25/2016 5.01  4.20 - 5.80 MIL/uL Final  . Hemoglobin 06/25/2016 16.1  13.2 - 17.1 g/dL Final  . HCT 06/25/2016 47.5  38.5 - 50.0 % Final  . MCV 06/25/2016 94.8  80.0 - 100.0 fL Final  . MCH 06/25/2016 32.1  27.0 - 33.0 pg Final  . MCHC 06/25/2016 33.9  32.0 -  36.0 g/dL Final  . RDW 06/25/2016 13.2  11.0 - 15.0 % Final  . Platelets 06/25/2016 210  140 - 400 K/uL Final  . MPV 06/25/2016 10.5  7.5 - 12.5 fL Final  . Neutro Abs 06/25/2016 3808  1,500 - 7,800 cells/uL Final  . Lymphs Abs 06/25/2016 1120  850 - 3,900 cells/uL Final  . Monocytes Absolute 06/25/2016 336  200 - 950 cells/uL Final  . Eosinophils  Absolute 06/25/2016 336  15 - 500 cells/uL Final  . Basophils Absolute 06/25/2016 0  0 - 200 cells/uL Final  . Neutrophils Relative % 06/25/2016 68  % Final  . Lymphocytes Relative 06/25/2016 20  % Final  . Monocytes Relative 06/25/2016 6  % Final  . Eosinophils Relative 06/25/2016 6  % Final  . Basophils Relative 06/25/2016 0  % Final  . Smear Review 06/25/2016 Criteria for review not met   Final  . Sodium 06/25/2016 140  135 - 146 mmol/L Final  . Potassium 06/25/2016 4.8  3.5 - 5.3 mmol/L Final  . Chloride 06/25/2016 102  98 - 110 mmol/L Final  . CO2 06/25/2016 28  20 - 31 mmol/L Final  . Glucose, Bld 06/25/2016 90  65 - 99 mg/dL Final  . BUN 06/25/2016 15  7 - 25 mg/dL Final  . Creat 06/25/2016 1.39* 0.70 - 1.18 mg/dL Final   Comment:   For patients > or = 80 years of age: The upper reference limit for Creatinine is approximately 13% higher for people identified as African-American.     . Total Bilirubin 06/25/2016 0.8  0.2 - 1.2 mg/dL Final  . Alkaline Phosphatase 06/25/2016 94  40 - 115 U/L Final  . AST 06/25/2016 21  10 - 35 U/L Final  . ALT 06/25/2016 19  9 - 46 U/L Final  . Total Protein 06/25/2016 6.8  6.1 - 8.1 g/dL Final  . Albumin 06/25/2016 4.5  3.6 - 5.1 g/dL Final  . Calcium 06/25/2016 9.7  8.6 - 10.3 mg/dL Final  . GFR, Est African American 06/25/2016 55* >=60 mL/min Final  . GFR, Est Non African American 06/25/2016 48* >=60 mL/min Final  . Amylase 06/25/2016 78  0 - 105 U/L Final  . Lipase 06/25/2016 21  7 - 60 U/L Final    No results found.   Assessment/Plan   ICD-10-CM   1. Abrasion of right elbow, initial encounter S50.311A   2. Chronic left maxillary sinusitis J32.0 Ambulatory referral to ENT    cefUROXime (CEFTIN) 250 MG tablet  3. Late onset Alzheimer's disease without behavioral disturbance G30.1    F02.80   4. Hypothyroidism, unspecified type E03.9 TSH  5. Mixed hyperlipidemia E78.2 Lipid Panel  6. High risk medication use Z79.899 BMP with eGFR      ALT    Recommend local wound care - clean with warm soapy antibacterial soap. Apply daily neosporin and telfa pad (nonstick) and wrap with guaze. Use paper tape to secure.   Recommend you get your hearing checked for hearing loss  Will call with lab results  Follow up in 6 mos for AWV/CPE   Azad Calame S. Perlie Gold  [facility] [address] [place], [place] [address] [phone] Cell (Monday-Friday 8 AM - 5 PM) [phone] After 5 PM and follow prompts

## 2016-12-04 NOTE — Patient Instructions (Addendum)
Recommend local wound care - clean with warm soapy antibacterial soap. Apply daily neosporin and telfa pad (nonstick) and wrap with guaze. Use paper tape to secure.   Recommend you get your hearing checked for hearing loss  Will call with lab results  Follow up in 6 mos for AWV/CPE

## 2016-12-05 LAB — BASIC METABOLIC PANEL WITH GFR
BUN: 12 mg/dL (ref 7–25)
CHLORIDE: 103 mmol/L (ref 98–110)
CO2: 27 mmol/L (ref 20–32)
CREATININE: 1.34 mg/dL — AB (ref 0.70–1.11)
Calcium: 9.2 mg/dL (ref 8.6–10.3)
GFR, Est African American: 57 mL/min — ABNORMAL LOW (ref >=60)
GFR, Est Non African American: 50 mL/min — ABNORMAL LOW (ref >=60)
GLUCOSE: 97 mg/dL (ref 65–99)
POTASSIUM: 4.2 mmol/L (ref 3.5–5.3)
Sodium: 139 mmol/L (ref 135–146)

## 2016-12-05 LAB — ALT: ALT: 20 U/L (ref 9–46)

## 2016-12-05 LAB — LIPID PANEL
CHOL/HDL RATIO: 2.5 ratio (ref <5.0)
Cholesterol: 143 mg/dL (ref <200)
HDL: 57 mg/dL (ref >40)
LDL CALC: 43 mg/dL (ref <100)
Triglycerides: 213 mg/dL — ABNORMAL HIGH (ref <150)
VLDL: 43 mg/dL — AB (ref <30)

## 2016-12-05 LAB — TSH: TSH: 3.07 m[IU]/L (ref 0.40–4.50)

## 2016-12-11 DIAGNOSIS — J342 Deviated nasal septum: Secondary | ICD-10-CM | POA: Diagnosis not present

## 2016-12-11 DIAGNOSIS — J329 Chronic sinusitis, unspecified: Secondary | ICD-10-CM | POA: Diagnosis not present

## 2016-12-12 ENCOUNTER — Other Ambulatory Visit: Payer: Self-pay | Admitting: Otolaryngology

## 2016-12-12 DIAGNOSIS — J329 Chronic sinusitis, unspecified: Secondary | ICD-10-CM

## 2016-12-13 ENCOUNTER — Other Ambulatory Visit: Payer: Medicare Other

## 2016-12-16 ENCOUNTER — Ambulatory Visit
Admission: RE | Admit: 2016-12-16 | Discharge: 2016-12-16 | Disposition: A | Discharge status: Home / Self Care | Payer: Medicare Other | Source: Ambulatory Visit | Attending: Otolaryngology | Admitting: Otolaryngology

## 2016-12-16 ENCOUNTER — Other Ambulatory Visit: Payer: Medicare Other

## 2016-12-16 DIAGNOSIS — J329 Chronic sinusitis, unspecified: Secondary | ICD-10-CM | POA: Diagnosis not present

## 2016-12-30 ENCOUNTER — Other Ambulatory Visit: Payer: Self-pay | Admitting: Internal Medicine

## 2016-12-30 DIAGNOSIS — F5101 Primary insomnia: Secondary | ICD-10-CM

## 2017-01-13 ENCOUNTER — Other Ambulatory Visit: Payer: Self-pay | Admitting: *Deleted

## 2017-01-13 MED ORDER — LOVASTATIN 40 MG PO TABS: ORAL_TABLET | ORAL | 3 refills | Status: DC

## 2017-01-13 NOTE — Telephone Encounter (Signed)
[facility] 

## 2017-01-27 ENCOUNTER — Other Ambulatory Visit: Payer: Self-pay | Admitting: Internal Medicine

## 2017-01-27 DIAGNOSIS — F5101 Primary insomnia: Secondary | ICD-10-CM

## 2017-01-29 ENCOUNTER — Other Ambulatory Visit: Payer: Self-pay | Admitting: Internal Medicine

## 2017-01-29 DIAGNOSIS — F5101 Primary insomnia: Secondary | ICD-10-CM

## 2017-01-29 NOTE — Telephone Encounter (Signed)
Patient wife, Joycelyn Schmid called and requested refills for patient to be faxed to [facility] Printed

## 2017-02-05 DIAGNOSIS — R3915 Urgency of urination: Secondary | ICD-10-CM | POA: Diagnosis not present

## 2017-02-27 ENCOUNTER — Other Ambulatory Visit: Payer: Self-pay | Admitting: Internal Medicine

## 2017-02-27 DIAGNOSIS — F5101 Primary insomnia: Secondary | ICD-10-CM

## 2017-02-27 NOTE — Telephone Encounter (Signed)
Refill requests were received for alprazolam and zolpidem. Rx were called in to the pharmacy after checking them on the [facility] site.

## 2017-03-12 ENCOUNTER — Encounter: Payer: Self-pay | Admitting: Cardiovascular Disease

## 2017-03-18 DIAGNOSIS — L570 Actinic keratosis: Secondary | ICD-10-CM | POA: Diagnosis not present

## 2017-03-18 DIAGNOSIS — L309 Dermatitis, unspecified: Secondary | ICD-10-CM | POA: Diagnosis not present

## 2017-03-19 ENCOUNTER — Other Ambulatory Visit: Payer: Self-pay | Admitting: Internal Medicine

## 2017-03-19 DIAGNOSIS — F5104 Psychophysiologic insomnia: Secondary | ICD-10-CM

## 2017-03-19 DIAGNOSIS — E039 Hypothyroidism, unspecified: Secondary | ICD-10-CM

## 2017-03-21 NOTE — Progress Notes (Signed)
Patient ID: Phillip Larson, male   DOB: [birth year], 82 y.o.   MRN: [id number]   Phillip Larson is seen today for followup of his blood pressure and CAD .[name] He has been more active. He is watching the salt in his diet. He also has hypercholesterolemia  He has CAD with a total RCA that is collaterized. He had a low risk myovue in 2009 .   Cath done 01/28/13 from right radial approach and no changes with chronically occluded RCA , robust collaterals and no significant left sided disease Echo reviewed from Sep 13 2016 EF 45-50% mild AR And trivial MR   Playing tennis 3x/week no chest pain   No longer seeing Art Nyoka Cowden sees a Dr Eulas Post now   Subacromial bursitis left shoulder sees [facility]  Alzheimers's started on aricept and namenda  Some exertional dyspnea    ROS: Denies fever, malais, weight loss, blurry vision, decreased visual acuity, cough, sputum, SOB, hemoptysis, pleuritic pain, palpitaitons, heartburn, abdominal pain, melena, lower extremity edema, claudication, or rash.  All other systems reviewed and negative  BP 118/72   Pulse 73   Ht 5\' 10"  (1.778 m)   Wt 183 lb 12 oz (83.3 kg)   SpO2 94%   BMI 26.37 kg/m  Affect appropriate Healthy:  appears stated age 86: normal Neck supple with no adenopathy JVP normal no bruits no thyromegaly Lungs clear with no wheezing and good diaphragmatic motion Heart:  S1/S2 SEM murmur, no rub, gallop or click PMI normal Abdomen: benighn, BS positve, no tenderness, no AAA no bruit.  No HSM or HJR Distal pulses intact with no bruits No edema Neuro non-focal significant memory issues  Skin warm and dry No muscular weakness Decreased ROM left shoulder      Current Outpatient Medications  Medication Sig Dispense Refill  . ALPRAZolam (XANAX) 0.5 MG tablet TAKE 1 TABLET BY MOUTH TWICE DAILY AS NEEDED 60 tablet 0  . aspirin EC 81 MG tablet Take 81 mg by mouth at bedtime.    [name] b complex vitamins tablet Take 1 tablet by mouth daily.    . cetirizine  (ZYRTEC) 10 MG tablet Take 1 tablet (10 mg total) by mouth daily. 30 tablet 11  . cholecalciferol (VITAMIN D) 1000 UNITS tablet Take 1,000 Units by mouth daily.    [name] diltiazem (CARDIZEM CD) 240 MG 24 hr capsule Take 1 capsule (240 mg total) by mouth daily. 90 capsule 3  . doxycycline (VIBRA-TABS) 100 MG tablet Take 1 tablet (100 mg total) by mouth 2 (two) times daily. 14 tablet 0  . galantamine (RAZADYNE ER) 24 MG 24 hr capsule TAKE ONE CAPSULE EVERY DAY WITH BREAKFAST 30 capsule 5  . isosorbide mononitrate (IMDUR) 30 MG 24 hr tablet Take 1 tablet (30 mg total) by mouth daily. 90 tablet 3  . levothyroxine (SYNTHROID, LEVOTHROID) 75 MCG tablet TAKE 1 TABLET BY MOUTH ONCE DAILY 90 tablet 1  . lovastatin (MEVACOR) 40 MG tablet Take one tablet by mouth once daily at bedtime as needed for cholesterol 90 tablet 3  . MAGNESIUM PO Take by mouth daily.    . memantine (NAMENDA) 10 MG tablet Take 10 mg by mouth 2 (two) times daily.    . mirabegron ER (MYRBETRIQ) 50 MG TB24 tablet One daily to help bladder control 90 tablet 3  . Multiple Vitamin (MULTIVITAMIN WITH MINERALS) TABS tablet Take 1 tablet by mouth daily.    . nitroGLYCERIN (NITROSTAT) 0.4 MG SL tablet One under the tongue if needed for  chest tightness. Repeat in 5 minutes if needed. Repeat again in 5 minutes if needed. 25 tablet 12  . sertraline (ZOLOFT) 50 MG tablet Take 1 tablet (50 mg total) by mouth daily. 90 tablet 3  . tamsulosin (FLOMAX) 0.4 MG CAPS capsule TAKE ONE CAPSULE BY MOUTH ONCE DAILY TO HELP RELIEVE OBSTRUCTION 90 capsule 1  . traZODone (DESYREL) 100 MG tablet TAKE 1 TABLET BY MOUTH AT BEDTIME TO  HELP  REST 90 tablet 1  . zinc gluconate 50 MG tablet Take 50 mg by mouth at bedtime.    [name] zolpidem (AMBIEN) 10 MG tablet TAKE 1 TABLET BY MOUTH AT BEDTIME 30 tablet 0   No current facility-administered medications for this visit.     Allergies  Iodine and Iohexol  Electrocardiogram:   03/08/14  SR rate 63  Old IMI  No change from  2014  09/14/15  SR rate 3 old IMI poor R Wave progression  09/02/16 SB rate 33 possible old IMI PVC   Assessment and Plan CAD:  Cath done 01/28/13 from right radial approach and no changes with chronically occluded RCA , robust collaterals and no significant left sided diseaseStable with no angina and good activity level.  Continue medical Rx EF 45-50% by echo 09/13/16   Chol:  On statin Cholesterol is at goal.  Continue current dose of statin and diet Rx.  No myalgias or side effects.  F/U  LFT's in 6 months. Lab Results  Component Value Date   LDLCALC 43 12/04/2016   labs with primary   Dementia:  Started on namenda and aricept f/u Dr Nyoka Cowden  HTN:  Well controlled.  Continue current medications and low sodium Dash type diet.    Prostate:  On proscar  Lab Results  Component Value Date   PSA 0.7 11/27/2015     Thyroid:  On replacement  Lab Results  Component Value Date   TSH 3.07 12/04/2016    Bursitis:  Left shoulder f/u [facility]   Fatigue/Murmur: Echo 09/13/16 trivial MR AV sclerosis and mild AR         Jenkins Rouge, MD

## 2017-03-24 ENCOUNTER — Encounter: Payer: Self-pay | Admitting: Cardiovascular Disease

## 2017-03-24 ENCOUNTER — Encounter: Payer: Self-pay | Admitting: Nurse Practitioner

## 2017-03-24 ENCOUNTER — Ambulatory Visit (INDEPENDENT_AMBULATORY_CARE_PROVIDER_SITE_OTHER): Payer: Medicare Other | Admitting: Nurse Practitioner

## 2017-03-24 ENCOUNTER — Encounter (INDEPENDENT_AMBULATORY_CARE_PROVIDER_SITE_OTHER): Payer: Self-pay

## 2017-03-24 ENCOUNTER — Ambulatory Visit: Payer: Medicare Other | Admitting: Cardiovascular Disease

## 2017-03-24 VITALS — BP 132/78 | HR 75 | Temp 98.1°F | Resp 18 | Ht 70.0 in | Wt 183.0 lb

## 2017-03-24 VITALS — BP 118/72 | HR 73 | Ht 70.0 in | Wt 183.8 lb

## 2017-03-24 DIAGNOSIS — T148XXA Other injury of unspecified body region, initial encounter: Secondary | ICD-10-CM | POA: Diagnosis not present

## 2017-03-24 DIAGNOSIS — L03114 Cellulitis of left upper limb: Secondary | ICD-10-CM | POA: Diagnosis not present

## 2017-03-24 DIAGNOSIS — I2510 Atherosclerotic heart disease of native coronary artery without angina pectoris: Secondary | ICD-10-CM

## 2017-03-24 DIAGNOSIS — I10 Essential (primary) hypertension: Secondary | ICD-10-CM

## 2017-03-24 DIAGNOSIS — I2583 Coronary atherosclerosis due to lipid rich plaque: Secondary | ICD-10-CM | POA: Diagnosis not present

## 2017-03-24 MED ORDER — DOXYCYCLINE HYCLATE 100 MG PO TABS: 100.0000 mg | ORAL_TABLET | Freq: Two times a day (BID) | ORAL | 0 refills | Status: DC

## 2017-03-24 NOTE — Patient Instructions (Addendum)

## 2017-03-24 NOTE — Patient Instructions (Signed)
Start doxycycline 100 mg by mouth twice daily with probiotic  Wash with mild soap and pat dry- can cover with dressing- twice daily  Notify if area becomes larger, increase redness or pain occurs.

## 2017-03-24 NOTE — Progress Notes (Signed)
Careteam: Patient Care Team: Gildardo Cranker, DO as PCP - General (Internal Medicine) Clent Jacks, MD as Consulting Physician (Ophthalmology) Lamonte Sakai Rose Fillers, MD as Consulting Physician (Pulmonary Disease) Angelia Mould, MD as Consulting Physician (Vascular Surgery) Earlie Server, MD as Consulting Physician (Orthopedic Surgery) Wilford Corner, MD as Consulting Physician (Gastroenterology) Josue Hector, MD as Consulting Physician (Cardiology) Chesley Mires, MD as Consulting Physician (Pulmonary Disease)  Advanced Directive information Does Patient Have a Medical Advance Directive?: No  Allergies  Allergen Reactions  . Iodine Swelling    Internal iodine  . Iohexol Hives         Chief Complaint  Patient presents with  . Acute Visit    Pt is being seen for a wound on left forearm. Pt reports that area bruised 10 days ago but pt does not know of any injury to area. Center of bruise had raised red area that burst and drained blood about 8 days ago. Pt reports no pain today but area was hurting yesterday.   . Other    Wife in room     HPI: Patient is a 82 y.o. male seen in the office today due to wound on forearm. Notice the area 10 days ago. Here today because of pain yesterday/last night. This has improved some today.  No injury but has been bleeding. Bright red blood started 4 days. Started to notice some pus like drainage which started yesterday. No fever or chills. Pain has improved but area is red.  Wife is convinced he hit something and does not remember.   Review of Systems:  Review of Systems  Constitutional: Negative for chills, fever and malaise/fatigue.  Skin: Negative for itching and rash.       See HPI  Psychiatric/Behavioral: Positive for memory loss.    Past Medical History:  Diagnosis Date  . Actinic keratosis   . Alzheimer's disease   . Anal fissure   . Anxiety state, unspecified   . Benign neoplasm of colon   . BENIGN PROSTATIC  HYPERTROPHY, HX OF   . BPH (benign prostatic hypertrophy) with urinary obstruction 06/22/2008   Qualifier: Diagnosis of  By: Johnsie Cancel, MD, Rona Ravens   . Cerebrovascular disease, unspecified   . Coronary atherosclerosis of native coronary artery   . Depressive disorder, not elsewhere classified   . Diverticulosis of colon (without mention of hemorrhage)   . Essential hypertension, benign   . External hemorrhoids without mention of complication   . Insomnia, unspecified   . Mixed hyperlipidemia   . Osteoarthrosis, unspecified whether generalized or localized, unspecified site   . Other premature beats   . Other psoriasis   . Pain in joint, lower leg   . Pain in joint, site unspecified   . Palpitations   . Pressure ulcer   . Reflux esophagitis   . Unspecified essential hypertension   . Unspecified hereditary and idiopathic peripheral neuropathy   . Unspecified hypothyroidism   . Unspecified vitamin D deficiency    Past Surgical History:  Procedure Laterality Date  . CERVICAL DISC SURGERY    . EYE SURGERY Left 02/07/2014   Cataract Surgery Left Eye  . KNEE SURGERY  07/18/2006   Dr French Ana   . LAMINECTOMY C3 disc  1972  . LEFT HEART CATHETERIZATION WITH CORONARY ANGIOGRAM N/A 01/28/2013   Procedure: LEFT HEART CATHETERIZATION WITH CORONARY ANGIOGRAM;  Surgeon: Josue Hector, MD;  Location: [facility] CATH LAB;  Service: Cardiovascular;  Laterality: N/A;  . MULTIPLE TOOTH  EXTRACTIONS  2014  . RETINAL TEAR REPAIR CRYOTHERAPY     Dr Wayne Sever    Social History:   reports that he quit smoking about 24 years ago. His smoking use included cigarettes. He has a 50.00 pack-year smoking history. he has never used smokeless tobacco. He reports that he drinks alcohol. He reports that he does not use drugs.  Family History  Problem Relation Age of Onset  . Cancer Father        pancreatic  . Cancer Mother        pancreatic    Medications:   Medication List        Accurate as of 03/24/17   3:01 PM. Always use your most recent med list.          ALPRAZolam 0.5 MG tablet Commonly known as:  XANAX TAKE 1 TABLET BY MOUTH TWICE DAILY AS NEEDED   aspirin EC 81 MG tablet   b complex vitamins tablet   cetirizine 10 MG tablet Commonly known as:  ZYRTEC Take 1 tablet (10 mg total) by mouth daily.   cholecalciferol 1000 units tablet Commonly known as:  VITAMIN D   diltiazem 240 MG 24 hr capsule Commonly known as:  CARDIZEM CD Take 1 capsule (240 mg total) by mouth daily.   galantamine 24 MG 24 hr capsule Commonly known as:  RAZADYNE ER TAKE ONE CAPSULE EVERY DAY WITH BREAKFAST   isosorbide mononitrate 30 MG 24 hr tablet Commonly known as:  IMDUR Take 1 tablet (30 mg total) by mouth daily.   levothyroxine 75 MCG tablet Commonly known as:  SYNTHROID, LEVOTHROID TAKE 1 TABLET BY MOUTH ONCE DAILY   lovastatin 40 MG tablet Commonly known as:  MEVACOR Take one tablet by mouth once daily at bedtime as needed for cholesterol   MAGNESIUM PO   memantine 10 MG tablet Commonly known as:  NAMENDA   mirabegron ER 50 MG Tb24 tablet Commonly known as:  MYRBETRIQ One daily to help bladder control   multivitamin with minerals Tabs tablet   nitroGLYCERIN 0.4 MG SL tablet Commonly known as:  NITROSTAT One under the tongue if needed for chest tightness. Repeat in 5 minutes if needed. Repeat again in 5 minutes if needed.   sertraline 50 MG tablet Commonly known as:  ZOLOFT Take 1 tablet (50 mg total) by mouth daily.   tamsulosin 0.4 MG Caps capsule Commonly known as:  FLOMAX TAKE ONE CAPSULE BY MOUTH ONCE DAILY TO HELP RELIEVE OBSTRUCTION   traZODone 100 MG tablet Commonly known as:  DESYREL TAKE 1 TABLET BY MOUTH AT BEDTIME TO  HELP  REST   zinc gluconate 50 MG tablet   zolpidem 10 MG tablet Commonly known as:  AMBIEN TAKE 1 TABLET BY MOUTH AT BEDTIME        Physical Exam:  Vitals:   03/24/17 1455  BP: 132/78  Pulse: 75  Resp: 18  Temp: 98.1 F (36.7  C)  TempSrc: Oral  SpO2: 95%  Weight: 183 lb (83 kg)  Height: 5\' 10"  (1.778 m)   Body mass index is 26.26 kg/m.  Physical Exam  Constitutional: He is oriented to person, place, and time. He appears well-developed and well-nourished.  HENT:  Head: Normocephalic and atraumatic.  Nose: Right sinus exhibits no frontal sinus tenderness. Left sinus exhibits no frontal sinus tenderness.  Mouth/Throat: Mucous membranes are normal.  Neck: Neck supple.  Cardiovascular: Normal rate and regular rhythm.  Murmur heard. no distal LE swelling  Pulmonary/Chest: Effort normal  and breath sounds normal. No respiratory distress. He has no wheezes. He has no rhonchi.  Abdominal: He exhibits no abdominal bruit and no pulsatile midline mass. There is no CVA tenderness.  Musculoskeletal: He exhibits no edema or tenderness.  Neurological: He is alert and oriented to person, place, and time.  Skin: Skin is warm and dry.  Pt with ecchymosis noted to right forearm in circular pattern, center of bruise with dark area surrounded by erythema    Psychiatric: His behavior is normal. Judgment and thought content normal. His affect is not blunt. He does not exhibit a depressed mood.    Labs reviewed: Basic Metabolic Panel: Recent Labs    06/25/16 1119 12/04/16 1250  NA 140 139  K 4.8 4.2  CL 102 103  CO2 28 27  GLUCOSE 90 97  BUN 15 12  CREATININE 1.39* 1.34*  CALCIUM 9.7 9.2  TSH  --  3.07   Liver Function Tests: Recent Labs    06/25/16 1119 12/04/16 1250  AST 21  --   ALT 19 20  ALKPHOS 94  --   BILITOT 0.8  --   PROT 6.8  --   ALBUMIN 4.5  --    Recent Labs    06/25/16 1119  LIPASE 21  AMYLASE 78   No results for input(s): AMMONIA in the last 8760 hours. CBC: Recent Labs    06/25/16 1119  WBC 5.6  NEUTROABS 3,808  HGB 16.1  HCT 47.5  MCV 94.8  PLT 210   Lipid Panel: Recent Labs    12/04/16 1250  CHOL 143  HDL 57  LDLCALC 43  TRIG 213*  CHOLHDL 2.5   TSH: Recent  Labs    12/04/16 1250  TSH 3.07   A1C: No results found for: HGBA1C   Assessment/Plan 1. Cellulitis of left upper extremity -increase erythema noted to forearm with presents of purulent drainage by pt with pain will treat for cellulitis.  - doxycycline (VIBRA-TABS) 100 MG tablet; Take 1 tablet (100 mg total) by mouth 2 (two) times daily.  Dispense: 14 tablet; Refill: 0  2. Hematoma -appears like he did hit something to left forearm which caused hematoma. This is improving now but still with some drainage.   To notify if area worsens while on antibiotic or fails to improve after completion.  Carlos American. Harle Battiest  [facility] [phone] 8 am - 5 pm) [phone] (after hours)

## 2017-03-27 ENCOUNTER — Other Ambulatory Visit: Payer: Self-pay | Admitting: Internal Medicine

## 2017-03-27 DIAGNOSIS — F5101 Primary insomnia: Secondary | ICD-10-CM

## 2017-03-28 ENCOUNTER — Other Ambulatory Visit: Payer: Self-pay | Admitting: Internal Medicine

## 2017-03-28 DIAGNOSIS — F5101 Primary insomnia: Secondary | ICD-10-CM

## 2017-03-28 NOTE — Telephone Encounter (Signed)
Rx's phoned to pharmacy. 

## 2017-04-18 ENCOUNTER — Telehealth: Payer: Self-pay

## 2017-04-18 MED ORDER — BENZONATATE 100 MG PO CAPS: 100.0000 mg | ORAL_CAPSULE | Freq: Three times a day (TID) | ORAL | 0 refills | Status: DC | PRN

## 2017-04-18 NOTE — Telephone Encounter (Signed)
Patient called complaining of cold/upper respiratory/sinus symptoms  1. Fever? No  2. Chills? No 3. Myalgias? No 4. How long have you had your symptoms? Onset was Sunday  5. Are you coughing up mucus and if yes any discoloration? Yes, no discoloration  6. What have you done for your symptoms thus far? Taking mucinex and delsym  7. Have you been around anyone sick? No  Symptoms include: Cough, wheezing, and runny nose  Pharmacy on file confirmed    Patient aware I will forward responses to provider and call with further instructions

## 2017-04-18 NOTE — Telephone Encounter (Signed)
Would cont mucinex and delsym routinely for the next few days. Make sure to be using mucinex with full glass of water and increasing hydration.  He can also do some of the following for sinus symptoms neti pot twice daily Plain nasal saline spray throughout the day as needed May use tylenol 325 mg 2 tablets every 6 hours as needed aches and pains or sore throat humidifier in the home to help with the dry air Keep well hydrated Avoid forcefully blowing nose- as this makes sinus congestion worse and can use saline to help thin the secretions.  Can call in benzonatate 100 mg by mouth every 8 hours as needed cough #20 to his pharmacy if he would like to try this to help in addition to delsym for cough.  Can make appt for Monday if needed

## 2017-04-18 NOTE — Telephone Encounter (Signed)
Patient aware of Jessica's response and verbalized understanding. Patient's wife wrote down instructions.  RX for Benzonatate sent to pharmacy  Patient will call on Monday if no better to schedule appointment  Patient expressed appreciation for Janett Billow addressing message.

## 2017-04-28 ENCOUNTER — Other Ambulatory Visit: Payer: Self-pay | Admitting: Internal Medicine

## 2017-04-28 DIAGNOSIS — F5101 Primary insomnia: Secondary | ICD-10-CM

## 2017-04-28 NOTE — Telephone Encounter (Signed)
A refill request was received for alprazolam and zolpidem. Rx were called in to pharmacy after verifying last fill date, provider, and quantity on PMP [place].

## 2017-05-09 DIAGNOSIS — N401 Enlarged prostate with lower urinary tract symptoms: Secondary | ICD-10-CM | POA: Diagnosis not present

## 2017-05-09 DIAGNOSIS — R3915 Urgency of urination: Secondary | ICD-10-CM | POA: Diagnosis not present

## 2017-05-21 DIAGNOSIS — H40013 Open angle with borderline findings, low risk, bilateral: Secondary | ICD-10-CM | POA: Diagnosis not present

## 2017-05-21 DIAGNOSIS — H26492 Other secondary cataract, left eye: Secondary | ICD-10-CM | POA: Diagnosis not present

## 2017-05-21 DIAGNOSIS — Z961 Presence of intraocular lens: Secondary | ICD-10-CM | POA: Diagnosis not present

## 2017-05-26 ENCOUNTER — Encounter: Payer: Self-pay | Admitting: Internal Medicine

## 2017-05-26 LAB — HM DIABETES EYE EXAM

## 2017-05-27 ENCOUNTER — Ambulatory Visit: Payer: Medicare Other

## 2017-05-27 ENCOUNTER — Other Ambulatory Visit: Payer: Self-pay | Admitting: Internal Medicine

## 2017-05-27 DIAGNOSIS — F5101 Primary insomnia: Secondary | ICD-10-CM

## 2017-05-28 NOTE — Telephone Encounter (Signed)
[place] Database verified and compliance confirmed   

## 2017-06-04 ENCOUNTER — Ambulatory Visit (INDEPENDENT_AMBULATORY_CARE_PROVIDER_SITE_OTHER): Payer: Medicare Other

## 2017-06-04 VITALS — BP 120/70 | HR 52 | Temp 98.2°F | Ht 70.0 in | Wt 183.0 lb

## 2017-06-04 DIAGNOSIS — Z0000 Encounter for general adult medical examination without abnormal findings: Secondary | ICD-10-CM | POA: Diagnosis not present

## 2017-06-04 MED ORDER — ZOSTER VAC RECOMB ADJUVANTED 50 MCG/0.5ML IM SUSR: 0.5000 mL | Freq: Once | INTRAMUSCULAR | 1 refills | Status: AC

## 2017-06-04 NOTE — Patient Instructions (Signed)
[name] , Thank you for taking time to come for your Medicare Wellness Visit. I appreciate your ongoing commitment to your health goals. Please review the following plan we discussed and let me know if I can assist you in the future.   Screening recommendations/referrals: Colonoscopy excluded, you are over age 81 Recommended yearly ophthalmology/optometry visit for glaucoma screening and checkup Recommended yearly dental visit for hygiene and checkup  Vaccinations: Influenza vaccine up to date, due 2019 fall season Pneumococcal vaccine up to date Tdap vaccine up to date, due 06/17/2021 Shingles vaccine due, prescription sent to pharmacy  Advanced directives: Please bring [place] a copy of your living will and health care power of attorney  Conditions/risks identified: none  Next appointment: Dr. Eulas Post 06/06/2017 @ 9:45am             Tyson Dense, RN 06/08/2018 @ 3:15pm  Preventive Care 81 Years and Older, Male Preventive care refers to lifestyle choices and visits with your health care provider that can promote health and wellness. What does preventive care include?  A yearly physical exam. This is also called an annual well check.  Dental exams once or twice a year.  Routine eye exams. Ask your health care provider how often you should have your eyes checked.  Personal lifestyle choices, including:  Daily care of your teeth and gums.  Regular physical activity.  Eating a healthy diet.  Avoiding tobacco and drug use.  Limiting alcohol use.  Practicing safe sex.  Taking low doses of aspirin every day.  Taking vitamin and mineral supplements as recommended by your health care provider. What happens during an annual well check? The services and screenings done by your health care provider during your annual well check will depend on your age, overall health, lifestyle risk factors, and family history of disease. Counseling  Your health care provider may ask you questions  about your:  Alcohol use.  Tobacco use.  Drug use.  Emotional well-being.  Home and relationship well-being.  Sexual activity.  Eating habits.  History of falls.  Memory and ability to understand (cognition).  Work and work Statistician. Screening  You may have the following tests or measurements:  Height, weight, and BMI.  Blood pressure.  Lipid and cholesterol levels. These may be checked every 5 years, or more frequently if you are over 63 years old.  Skin check.  Lung cancer screening. You may have this screening every year starting at age 23 if you have a 30-pack-year history of smoking and currently smoke or have quit within the past 15 years.  Fecal occult blood test (FOBT) of the stool. You may have this test every year starting at age 21.  Flexible sigmoidoscopy or colonoscopy. You may have a sigmoidoscopy every 5 years or a colonoscopy every 10 years starting at age 11.  Prostate cancer screening. Recommendations will vary depending on your family history and other risks.  Hepatitis C blood test.  Hepatitis B blood test.  Sexually transmitted disease (STD) testing.  Diabetes screening. This is done by checking your blood sugar (glucose) after you have not eaten for a while (fasting). You may have this done every 1-3 years.  Abdominal aortic aneurysm (AAA) screening. You may need this if you are a current or former smoker.  Osteoporosis. You may be screened starting at age 78 if you are at high risk. Talk with your health care provider about your test results, treatment options, and if necessary, the need for more tests. Vaccines  Your health care provider may recommend certain vaccines, such as:  Influenza vaccine. This is recommended every year.  Tetanus, diphtheria, and acellular pertussis (Tdap, Td) vaccine. You may need a Td booster every 10 years.  Zoster vaccine. You may need this after age 89.  Pneumococcal 13-valent conjugate (PCV13)  vaccine. One dose is recommended after age 74.  Pneumococcal polysaccharide (PPSV23) vaccine. One dose is recommended after age 35. Talk to your health care provider about which screenings and vaccines you need and how often you need them. This information is not intended to replace advice given to you by your health care provider. Make sure you discuss any questions you have with your health care provider. Document Released: 05/05/2015 Document Revised: 12/27/2015 Document Reviewed: 02/07/2015 Elsevier Interactive Patient Education  2017 [employer] Prevention in the Home Falls can cause injuries. They can happen to people of all ages. There are many things you can do to make your home safe and to help prevent falls. What can I do on the outside of my home?  Regularly fix the edges of walkways and driveways and fix any cracks.  Remove anything that might make you trip as you walk through a door, such as a raised step or threshold.  Trim any bushes or trees on the path to your home.  Use bright outdoor lighting.  Clear any walking paths of anything that might make someone trip, such as rocks or tools.  Regularly check to see if handrails are loose or broken. Make sure that both sides of any steps have handrails.  Any raised decks and porches should have guardrails on the edges.  Have any leaves, snow, or ice cleared regularly.  Use sand or salt on walking paths during winter.  Clean up any spills in your garage right away. This includes oil or grease spills. What can I do in the bathroom?  Use night lights.  Install grab bars by the toilet and in the tub and shower. Do not use towel bars as grab bars.  Use non-skid mats or decals in the tub or shower.  If you need to sit down in the shower, use a plastic, non-slip stool.  Keep the floor dry. Clean up any water that spills on the floor as soon as it happens.  Remove soap buildup in the tub or shower  regularly.  Attach bath mats securely with double-sided non-slip rug tape.  Do not have throw rugs and other things on the floor that can make you trip. What can I do in the bedroom?  Use night lights.  Make sure that you have a light by your bed that is easy to reach.  Do not use any sheets or blankets that are too big for your bed. They should not hang down onto the floor.  Have a firm chair that has side arms. You can use this for support while you get dressed.  Do not have throw rugs and other things on the floor that can make you trip. What can I do in the kitchen?  Clean up any spills right away.  Avoid walking on wet floors.  Keep items that you use a lot in easy-to-reach places.  If you need to reach something above you, use a strong step stool that has a grab bar.  Keep electrical cords out of the way.  Do not use floor polish or wax that makes floors slippery. If you must use wax, use non-skid floor wax.  Do  not have throw rugs and other things on the floor that can make you trip. What can I do with my stairs?  Do not leave any items on the stairs.  Make sure that there are handrails on both sides of the stairs and use them. Fix handrails that are broken or loose. Make sure that handrails are as long as the stairways.  Check any carpeting to make sure that it is firmly attached to the stairs. Fix any carpet that is loose or worn.  Avoid having throw rugs at the top or bottom of the stairs. If you do have throw rugs, attach them to the floor with carpet tape.  Make sure that you have a light switch at the top of the stairs and the bottom of the stairs. If you do not have them, ask someone to add them for you. What else can I do to help prevent falls?  Wear shoes that:  Do not have high heels.  Have rubber bottoms.  Are comfortable and fit you well.  Are closed at the toe. Do not wear sandals.  If you use a stepladder:  Make sure that it is fully  opened. Do not climb a closed stepladder.  Make sure that both sides of the stepladder are locked into place.  Ask someone to hold it for you, if possible.  Clearly mark and make sure that you can see:  Any grab bars or handrails.  First and last steps.  Where the edge of each step is.  Use tools that help you move around (mobility aids) if they are needed. These include:  Canes.  Walkers.  Scooters.  Crutches.  Turn on the lights when you go into a dark area. Replace any light bulbs as soon as they burn out.  Set up your furniture so you have a clear path. Avoid moving your furniture around.  If any of your floors are uneven, fix them.  If there are any pets around you, be aware of where they are.  Review your medicines with your doctor. Some medicines can make you feel dizzy. This can increase your chance of falling. Ask your doctor what other things that you can do to help prevent falls. This information is not intended to replace advice given to you by your health care provider. Make sure you discuss any questions you have with your health care provider. Document Released: 02/02/2009 Document Revised: 09/14/2015 Document Reviewed: 05/13/2014 Elsevier Interactive Patient Education  2017 [employer].

## 2017-06-04 NOTE — Progress Notes (Signed)
Subjective:   Phillip Larson is a 83 y.o. male who presents for Medicare Annual/Subsequent preventive examination.  Last AWV-05/21/2016    Objective:    Vitals: BP 120/70 (BP Location: Left Arm, Patient Position: Sitting)   Pulse (!) 52   Temp 98.2 F (36.8 C) (Oral)   Ht 5\' 10"  (1.778 m)   Wt 183 lb (83 kg)   SpO2 95%   BMI 26.26 kg/m   Body mass index is 26.26 kg/m.  Advanced Directives 06/04/2017 03/24/2017 06/25/2016 06/05/2016 05/21/2016 01/18/2016 05/24/2015  Does Patient Have a Medical Advance Directive? Yes No Yes Yes Yes Yes Yes  Type of Paramedic of [place];Living will - [place];Living will [place];Living will Healthcare Power of [place];Living will [place];Living will  Does patient want to make changes to medical advance directive? No - Patient declined - - No - Patient declined - - No - Patient declined  Copy of [place] in Chart? No - copy requested - No - copy requested - No - copy requested No - copy requested Yes  Pre-existing out of facility DNR order (yellow form or pink MOST form) - - - - - - -    Tobacco Social History   Tobacco Use  Smoking Status Former Smoker  . Packs/day: 2.00  . Years: 25.00  . Pack years: 50.00  . Types: Cigarettes  . Last attempt to quit: 04/22/1992  . Years since quitting: 25.1  Smokeless Tobacco Never Used     Counseling given: Not Answered   Clinical Intake:  Pre-visit preparation completed: No  Pain : No/denies pain     Nutritional Status: BMI 25 -29 Overweight Nutritional Risks: None Diabetes: No  How often do you need to have someone help you when you read instructions, pamphlets, or other written materials from your doctor or pharmacy?: 1 - Never What is the last grade level you completed in school?: 1 year college  Interpreter Needed?: No  Information entered by :: Tyson Dense, RN  Past Medical History:  Diagnosis Date  . Actinic keratosis   . Alzheimer's disease   . Anal fissure   . Anxiety state, unspecified   . Benign neoplasm of colon   . BENIGN PROSTATIC HYPERTROPHY, HX OF   . BPH (benign prostatic hypertrophy) with urinary obstruction 06/22/2008   Qualifier: Diagnosis of  By: Johnsie Cancel, MD, Rona Ravens   . Cerebrovascular disease, unspecified   . Coronary atherosclerosis of native coronary artery   . Depressive disorder, not elsewhere classified   . Diverticulosis of colon (without mention of hemorrhage)   . Essential hypertension, benign   . External hemorrhoids without mention of complication   . Insomnia, unspecified   . Mixed hyperlipidemia   . Osteoarthrosis, unspecified whether generalized or localized, unspecified site   . Other premature beats   . Other psoriasis   . Pain in joint, lower leg   . Pain in joint, site unspecified   . Palpitations   . Pressure ulcer   . Reflux esophagitis   . Unspecified essential hypertension   . Unspecified hereditary and idiopathic peripheral neuropathy   . Unspecified hypothyroidism   . Unspecified vitamin D deficiency    Past Surgical History:  Procedure Laterality Date  . CERVICAL DISC SURGERY    . EYE SURGERY Left 02/07/2014   Cataract Surgery Left Eye  . KNEE SURGERY  07/18/2006   Dr French Ana   .  LAMINECTOMY C3 disc  1972  . LEFT HEART CATHETERIZATION WITH CORONARY ANGIOGRAM N/A 01/28/2013   Procedure: LEFT HEART CATHETERIZATION WITH CORONARY ANGIOGRAM;  Surgeon: Josue Hector, MD;  Location[facility] C[facility] LAB;  Service: Cardiovascular;  Laterality: N/A;  . MULTIPLE TOOTH EXTRACTIONS  2014  . RETINAL TEAR REPAIR CRYOTHERAPY     Dr Wayne Sever    Family History  Problem Relation Age of Onset  . Cancer Father        pancreatic  . Cancer Mother        pancreatic   Social History   Socioeconomic History  . Marital status: Married    Spouse name: None  . Number of children: None  .  Years of education: None  . Highest education level: None  Social Needs  . Financial resource strain: Not hard at all  . Food insecurity - worry: Never true  . Food insecurity - inability: Never true  . Transportation needs - medical: No  . Transportation needs - non-medical: No  Occupational History  . Occupation: retired  Tobacco Use  . Smoking status: Former Smoker    Packs/day: 2.00    Years: 25.00    Pack years: 50.00    Types: Cigarettes    Last attempt to quit: 04/22/1992    Years since quitting: 25.1  . Smokeless tobacco: Never Used  Substance and Sexual Activity  . Alcohol use: Yes    Alcohol/week: 0.0 oz    Comment: beer/wine/liquor daily  . Drug use: No  . Sexual activity: Not Currently  Other Topics Concern  . None  Social History Narrative  . None    Outpatient Encounter Medications as of 06/04/2017  Medication Sig  . ALPRAZolam (XANAX) 0.5 MG tablet TAKE 1 TABLET BY MOUTH TWICE DAILY AS NEEDED  . aspirin EC 81 MG tablet Take 81 mg by mouth at bedtime.  [name] b complex vitamins tablet Take 1 tablet by mouth daily.  . benzonatate (TESSALON) 100 MG capsule Take 1 capsule (100 mg total) by mouth every 8 (eight) hours as needed for cough (Cough).  . cetirizine (ZYRTEC) 10 MG tablet Take 1 tablet (10 mg total) by mouth daily.  . cholecalciferol (VITAMIN D) 1000 UNITS tablet Take 1,000 Units by mouth daily.  [name] diltiazem (CARDIZEM CD) 240 MG 24 hr capsule Take 1 capsule (240 mg total) by mouth daily.  [name] doxycycline (VIBRA-TABS) 100 MG tablet Take 1 tablet (100 mg total) by mouth 2 (two) times daily.  [name] galantamine (RAZADYNE ER) 24 MG 24 hr capsule TAKE ONE CAPSULE EVERY DAY WITH BREAKFAST  . isosorbide mononitrate (IMDUR) 30 MG 24 hr tablet Take 1 tablet (30 mg total) by mouth daily.  [name] levothyroxine (SYNTHROID, LEVOTHROID) 75 MCG tablet TAKE 1 TABLET BY MOUTH ONCE DAILY  . lovastatin (MEVACOR) 40 MG tablet Take one tablet by mouth once daily at bedtime as needed for  cholesterol  . MAGNESIUM PO Take by mouth daily.  . memantine (NAMENDA) 10 MG tablet Take 10 mg by mouth 2 (two) times daily.  . mirabegron ER (MYRBETRIQ) 50 MG TB24 tablet One daily to help bladder control  . Multiple Vitamin (MULTIVITAMIN WITH MINERALS) TABS tablet Take 1 tablet by mouth daily.  . nitroGLYCERIN (NITROSTAT) 0.4 MG SL tablet One under the tongue if needed for chest tightness. Repeat in 5 minutes if needed. Repeat again in 5 minutes if needed.  . sertraline (ZOLOFT) 50 MG tablet Take 1 tablet (50 mg total) by mouth daily.  [name]  tamsulosin (FLOMAX) 0.4 MG CAPS capsule TAKE ONE CAPSULE BY MOUTH ONCE DAILY TO HELP RELIEVE OBSTRUCTION  . traZODone (DESYREL) 100 MG tablet TAKE 1 TABLET BY MOUTH AT BEDTIME TO  HELP  REST  . zinc gluconate 50 MG tablet Take 50 mg by mouth at bedtime.  [name] zolpidem (AMBIEN) 10 MG tablet TAKE 1 TABLET BY MOUTH AT BEDTIME   No facility-administered encounter medications on file as of 06/04/2017.     Activities of Daily Living In your present state of health, do you have any difficulty performing the following activities: 06/04/2017  Hearing? N  Vision? N  Difficulty concentrating or making decisions? N  Walking or climbing stairs? N  Dressing or bathing? N  Doing errands, shopping? N  Preparing Food and eating ? N  Using the Toilet? N  In the past six months, have you accidently leaked urine? Y  Do you have problems with loss of bowel control? N  Managing your Medications? Y  Managing your Finances? N  Housekeeping or managing your Housekeeping? N  Some recent data might be hidden    Patient Care Team: Gildardo Cranker, DO as PCP - General (Internal Medicine) Clent Jacks, MD as Consulting Physician (Ophthalmology) Lamonte Sakai Rose Fillers, MD as Consulting Physician (Pulmonary Disease) Angelia Mould, MD as Consulting Physician (Vascular Surgery) Earlie Server, MD as Consulting Physician (Orthopedic Surgery) Wilford Corner, MD as Consulting  Physician (Gastroenterology) Josue Hector, MD as Consulting Physician (Cardiology) Chesley Mires, MD as Consulting Physician (Pulmonary Disease)   Assessment:   This is a routine wellness examination for Phillip Larson.  Exercise Activities and Dietary recommendations Current Exercise Habits: Home exercise routine, Type of exercise: Other - see comments(tennis), Time (Minutes): > 60, Frequency (Times/Week): 2, Weekly Exercise (Minutes/Week): 0, Intensity: Mild, Exercise limited by: None identified  Goals    . Exercise 3x per week (30 min per time)     Pt just got a membership to the gym and would like to go 3 days a week       Fall Risk Fall Risk  06/04/2017 03/24/2017 12/04/2016 06/25/2016 05/21/2016  Falls in the past year? No No Yes No Yes  Number falls in past yr: - - 1 - 1  Comment - - - - Fell while playing tennis  Injury with Fall? - - Yes - Yes  Comment - - Injured right elbow  - Skinned elbow and left knee    Is the patient's home free of loose throw rugs in walkways, pet beds, electrical cords, etc?   yes      Grab bars in the bathroom? yes      Handrails on the stairs?   yes      Adequate lighting?   yes  Timed Get Up and Go Performed: 16 seconds, fall risk  Depression Screen PHQ 2/9 Scores 06/04/2017 05/21/2016 11/29/2015 06/22/2014  PHQ - 2 Score 0 0 0 6  PHQ- 9 Score - - - 14    Cognitive Function MMSE - Mini Mental State Exam 06/04/2017 05/21/2016 11/29/2015  Orientation to time 5 3 5   Orientation to Place 5 5 5   Registration 3 3 3   Attention/ Calculation 5 4 5   Recall 0 2 3  Language- name 2 objects 2 2 2   Language- repeat 1 1 1   Language- follow 3 step command 3 3 3   Language- read & follow direction 1 1 1   Write a sentence 1 1 1   Copy design 1 1 1   Total score  27 26 30         Immunization History  Administered Date(s) Administered  . Influenza,inj,Quad PF,6+ Mos 03/03/2013, 05/24/2015, 05/21/2016  . Pneumococcal Conjugate-13 05/21/2016  . Pneumococcal  Polysaccharide-23 11/14/1998  . Td 11/14/1998  . Tdap 06/18/2011  . Zoster 05/27/2007    Qualifies for Shingles Vaccine? Yes, educated and prescription sent to pharmacy  Screening Tests Health Maintenance  Topic Date Due  . INFLUENZA VACCINE  11/20/2016  . PNA vac Low Risk Adult (2 of 2 - PPSV23) 05/21/2017  . TETANUS/TDAP  06/17/2021   Cancer Screenings: Lung: Low Dose CT Chest recommended if Age 84-80 years, 30 pack-year currently smoking OR have quit w/in 15years. Patient does not qualify. Colorectal: up to date  Additional Screenings:  Hepatitis B/HIV/Syphillis: not indicated Hepatitis C Screening: declined    Plan:    I have personally reviewed and addressed the Medicare Annual Wellness questionnaire and have noted the following in the patient's chart:  A. Medical and social history B. Use of alcohol, tobacco or illicit drugs  C. Current medications and supplements D. Functional ability and status E.  Nutritional status F.  Physical activity G. Advance directives H. List of other physicians I.  Hospitalizations, surgeries, and ER visits in previous 12 months J.  [place] to include hearing, vision, cognitive, depression L. Referrals and appointments - none  In addition, I have reviewed and discussed with patient certain preventive protocols, quality metrics, and best practice recommendations. A written personalized care plan for preventive services as well as general preventive health recommendations were provided to patient.  See attached scanned questionnaire for additional information.   Signed,   Tyson Dense, RN Nurse Health Advisor   Quick Notes   Health Maintenance: Shingrix due and ordered     Abnormal Screen: MMSE 27/30, passed clock drawing     Patient Concerns: Itching in multiple places over the body     Nurse Concerns: None

## 2017-06-06 ENCOUNTER — Encounter: Payer: Self-pay | Admitting: Internal Medicine

## 2017-06-06 ENCOUNTER — Ambulatory Visit (INDEPENDENT_AMBULATORY_CARE_PROVIDER_SITE_OTHER): Payer: Medicare Other | Admitting: Internal Medicine

## 2017-06-06 VITALS — BP 148/80 | HR 52 | Temp 97.9°F | Resp 10 | Ht 70.0 in | Wt 185.0 lb

## 2017-06-06 DIAGNOSIS — Z0000 Encounter for general adult medical examination without abnormal findings: Secondary | ICD-10-CM

## 2017-06-06 DIAGNOSIS — F0280 Dementia in other diseases classified elsewhere without behavioral disturbance: Secondary | ICD-10-CM

## 2017-06-06 DIAGNOSIS — L853 Xerosis cutis: Secondary | ICD-10-CM | POA: Diagnosis not present

## 2017-06-06 DIAGNOSIS — I10 Essential (primary) hypertension: Secondary | ICD-10-CM | POA: Diagnosis not present

## 2017-06-06 DIAGNOSIS — E782 Mixed hyperlipidemia: Secondary | ICD-10-CM

## 2017-06-06 DIAGNOSIS — Z79899 Other long term (current) drug therapy: Secondary | ICD-10-CM

## 2017-06-06 DIAGNOSIS — E039 Hypothyroidism, unspecified: Secondary | ICD-10-CM

## 2017-06-06 DIAGNOSIS — G301 Alzheimer's disease with late onset: Secondary | ICD-10-CM

## 2017-06-06 DIAGNOSIS — M2550 Pain in unspecified joint: Secondary | ICD-10-CM | POA: Diagnosis not present

## 2017-06-06 NOTE — Patient Instructions (Addendum)
Bring copy of [place] and/or Living Will to next appointment.   Use moisturizing lotion (aveeno, eucerin cream, aquaphor, curel) daily for dry skin. May need dermatology follow up if no better  Continue current medications as ordered  Will call with lab results  Follow up with specialists as scheduled  Continue diet and exercise program  Follow up in 6 mos for thyroid, HTN, hyperlipidemia, BPH, foot pain

## 2017-06-06 NOTE — Progress Notes (Signed)
Patient ID: Phillip Larson, male   DOB: 05-23-1934, 83 y.o.   MRN: [id number]   Location:  [facility]  Place of Service:  OFFICE  Provider: Arletha Grippe, DO  Patient Care Team: Gildardo Cranker, DO as PCP - General (Internal Medicine) Clent Jacks, MD as Consulting Physician (Ophthalmology) Lamonte Sakai Rose Fillers, MD as Consulting Physician (Pulmonary Disease) Angelia Mould, MD as Consulting Physician (Vascular Surgery) Earlie Server, MD as Consulting Physician (Orthopedic Surgery) Wilford Corner, MD as Consulting Physician (Gastroenterology) Josue Hector, MD as Consulting Physician (Cardiology) Chesley Mires, MD as Consulting Physician (Pulmonary Disease)  Extended Emergency Contact Information Primary Emergency Contact: Treloar,Peggy "Joycelyn Schmid" Address: [address]          [place], [place] Phone: [phone] Mobile Phone: [phone] Relation: Spouse  Code Status: FULL CODE Goals of Care: Advanced Directive information Advanced Directives 06/04/2017  Does Patient Have a Medical Advance Directive? Yes  Type of Paramedic of [place];Living will  Does patient want to make changes to medical advance directive? No - Patient declined  Copy of [place] in Chart? No - copy requested  Pre-existing out of facility DNR order (yellow form or pink MOST form) -     Chief Complaint  Patient presents with  . Annual Exam    Yearly check-up, EKG completed 09/02/16, AWV completed 06/04/17- MMSE (27/30)  . Medication Refill    No refills needed   . Pruritis    Patient c/o itching all over, discuss getting something stronger than zyrtec     HPI: Patient is a 83 y.o. male seen today for a comprehensive exam.  Reviewed 06/04/17 AWV note. He continues to have generalized itching despite taking daily zyrtec. Itching in random places - medial knees, along watch band (tried switching bands without relief), thumbs. Sx's  x 6 mos and improves temporarily with topical benadryl spray. He has not noticed a rash. He sees a dermatologist on an annual basis for scalp dermatitis.   He continues to play tennis 2 times per week and active at home.   HTN/angina - stable on cardizem CD, imdur and prn SL NTG. He takes ASA daily for anticoagulation. Followed by cardio Dr Johnsie Cancel  Hypothyroidism - stable on levothyroxine. TSH 3.07  Insomnia - stable on ambien and trazodone  Anxiety/recurrent MDD -  Mood stable on sertraline and trazodone  BPH with lower urinary tract sx's - stable on myrbetriq and flomax  Hyperlipidemia - stable on lovastatin. LDL 43; TG 213  Alzheimer's dementia - stable on razadyne ER and namenda. MMSE 27/30  Allergic rhinitis - stable on cetirizine  CKD - stage  3. Cr 1.34   Depression screen [facility] 2/9 06/06/2017 06/04/2017 05/21/2016 11/29/2015 06/22/2014  Decreased Interest 0 0 0 0 3  Down, Depressed, Hopeless 0 0 0 0 3  PHQ - 2 Score 0 0 0 0 6  Altered sleeping - - - - 1  Tired, decreased energy - - - - 2  Change in appetite - - - - 3  Feeling bad or failure about yourself  - - - - 1  Trouble concentrating - - - - 0  Moving slowly or fidgety/restless - - - - 1  Suicidal thoughts - - - - 0  PHQ-9 Score - - - - 14    Fall Risk  06/06/2017 06/04/2017 03/24/2017 12/04/2016 06/25/2016  Falls in the past year? No No No Yes No  Number falls in past  yr: - - - 1 -  Comment - - - - -  Injury with Fall? - - - Yes -  Comment - - - Injured right elbow  -   MMSE - Mini Mental State Exam 06/04/2017 05/21/2016 11/29/2015  Orientation to time _0 Orientation to Place _1 Registration _2 Attention/ Calculation _3 Recall 0 2 3  Language- name 2 objects _4 Language- repeat _5 Language- follow 3 step command _6 Language- read & follow direction _7 Write a sentence _8 Copy design _9 Total score _10 Health Maintenance  Topic Date Due  . INFLUENZA VACCINE   11/20/2016  . PNA vac Low Risk Adult (2 of 2 - PPSV23) 05/21/2017  . TETANUS/TDAP  06/17/2021    Past Medical History:  Diagnosis Date  . Actinic keratosis   . Alzheimer's disease   . Anal fissure   . Anxiety state, unspecified   . Benign neoplasm of colon   . BENIGN PROSTATIC HYPERTROPHY, HX OF   . BPH (benign prostatic hypertrophy) with urinary obstruction 06/22/2008   Qualifier: Diagnosis of  By: Johnsie Cancel, MD, Rona Ravens   . Cerebrovascular disease, unspecified   . Coronary atherosclerosis of native coronary artery   . Depressive disorder, not elsewhere classified   . Diverticulosis of colon (without mention of hemorrhage)   . Essential hypertension, benign   . External hemorrhoids without mention of complication   . Insomnia, unspecified   . Mixed hyperlipidemia   . Osteoarthrosis, unspecified whether generalized or localized, unspecified site   . Other premature beats   . Other psoriasis   . Pain in joint, lower leg   . Pain in joint, site unspecified   . Palpitations   . Pressure ulcer   . Reflux esophagitis   . Unspecified essential hypertension   . Unspecified hereditary and idiopathic peripheral neuropathy   . Unspecified hypothyroidism   . Unspecified vitamin D deficiency     Past Surgical History:  Procedure Laterality Date  . CERVICAL DISC SURGERY    . EYE SURGERY Left 02/07/2014   Cataract Surgery Left Eye  . KNEE SURGERY  07/18/2006   Dr French Ana   . LAMINECTOMY C3 disc  1972  . LEFT HEART CATHETERIZATION WITH CORONARY ANGIOGRAM N/A 01/28/2013   Procedure: LEFT HEART CATHETERIZATION WITH CORONARY ANGIOGRAM;  Surgeon: Josue Hector, MD;  Location: [facility] CATH LAB;  Service: Cardiovascular;  Laterality: N/A;  . MULTIPLE TOOTH EXTRACTIONS  2014  . RETINAL TEAR REPAIR CRYOTHERAPY     Dr Wayne Sever     Family History  Problem Relation Age of Onset  . Cancer Father        pancreatic  . Cancer Mother        pancreatic   Family Status  Relation Name Status   . Father Toy Deceased  . Mother Jeannett Senior Deceased  . Daughter [employer]  . Son Murlean Iba  . Daughter Marga Hoots  . Daughter Hughie Closs  . MGM  Deceased  . MGF  Deceased  . PGM  Deceased  . PGF  Deceased    Social History   Socioeconomic History  . Marital status: Married    Spouse name: Not on file  . Number of children: Not on file  . Years of education: Not on file  .  Highest education level: Not on file  Social Needs  . Financial resource strain: Not hard at all  . Food insecurity - worry: Never true  . Food insecurity - inability: Never true  . Transportation needs - medical: No  . Transportation needs - non-medical: No  Occupational History  . Occupation: retired  Tobacco Use  . Smoking status: Former Smoker    Packs/day: 2.00    Years: 25.00    Pack years: 50.00    Types: Cigarettes    Last attempt to quit: 04/22/1992    Years since quitting: 25.1  . Smokeless tobacco: Never Used  Substance and Sexual Activity  . Alcohol use: Yes    Alcohol/week: 0.0 oz    Comment: beer/wine/liquor daily  . Drug use: No  . Sexual activity: Not Currently  Other Topics Concern  . Not on file  Social History Narrative  . Not on file    Allergies  Allergen Reactions  . Iodine Swelling    Internal iodine  . Iohexol Hives         Allergies as of 06/06/2017      Reactions   Iodine Swelling   Internal iodine   Iohexol Hives         Medication List        Accurate as of 06/06/17 10:02 AM. Always use your most recent med list.          ALPRAZolam 0.5 MG tablet Commonly known as:  XANAX TAKE 1 TABLET BY MOUTH TWICE DAILY AS NEEDED   aspirin EC 81 MG tablet Take 81 mg by mouth at bedtime.   b complex vitamins tablet Take 1 tablet by mouth daily.   cetirizine 10 MG tablet Commonly known as:  ZYRTEC Take 1 tablet (10 mg total) by mouth daily.   cholecalciferol 1000 units tablet Commonly known as:  VITAMIN D Take 1,000 Units by mouth daily.     diltiazem 240 MG 24 hr capsule Commonly known as:  CARDIZEM CD Take 1 capsule (240 mg total) by mouth daily.   galantamine 24 MG 24 hr capsule Commonly known as:  RAZADYNE ER TAKE ONE CAPSULE EVERY DAY WITH BREAKFAST   isosorbide mononitrate 30 MG 24 hr tablet Commonly known as:  IMDUR Take 1 tablet (30 mg total) by mouth daily.   levothyroxine 75 MCG tablet Commonly known as:  SYNTHROID, LEVOTHROID TAKE 1 TABLET BY MOUTH ONCE DAILY   lovastatin 40 MG tablet Commonly known as:  MEVACOR Take one tablet by mouth once daily at bedtime as needed for cholesterol   MAGNESIUM PO Take by mouth daily.   memantine 10 MG tablet Commonly known as:  NAMENDA Take 10 mg by mouth 2 (two) times daily.   multivitamin with minerals Tabs tablet Take 1 tablet by mouth daily.   MYRBETRIQ 50 MG Tb24 tablet Generic drug:  mirabegron ER Take 50 mg by mouth every other day.   nitroGLYCERIN 0.4 MG SL tablet Commonly known as:  NITROSTAT One under the tongue if needed for chest tightness. Repeat in 5 minutes if needed. Repeat again in 5 minutes if needed.   sertraline 50 MG tablet Commonly known as:  ZOLOFT Take 1 tablet (50 mg total) by mouth daily.   tamsulosin 0.4 MG Caps capsule Commonly known as:  FLOMAX TAKE ONE CAPSULE BY MOUTH ONCE DAILY TO HELP RELIEVE OBSTRUCTION   traZODone 100 MG tablet Commonly known as:  DESYREL TAKE 1 TABLET BY MOUTH AT BEDTIME TO  HELP  REST   zinc gluconate 50 MG tablet Take 50 mg by mouth at bedtime.   zolpidem 10 MG tablet Commonly known as:  AMBIEN TAKE 1 TABLET BY MOUTH AT BEDTIME        Review of Systems:  Review of Systems  Musculoskeletal: Positive for arthralgias and gait problem (feet painful after playing tennis but resolves after several steps).  All other systems reviewed and are negative.   Physical Exam: Vitals:   06/06/17 0952  BP: (!) 148/80  Pulse: (!) 52  Resp: 10  Temp: 97.9 F (36.6 C)  TempSrc: Oral  SpO2: 95%   Weight: 185 lb (83.9 kg)  Height: 5' 10" (1.778 m)   Body mass index is 26.54 kg/m. Physical Exam  Constitutional: He is oriented to person, place, and time. He appears well-developed and well-nourished. No distress.  HENT:  Head: Normocephalic and atraumatic.  Right Ear: External ear normal.  Left Ear: External ear normal.  Mouth/Throat: Oropharynx is clear and moist. No oropharyngeal exudate.  MMM; no oral thrush  Eyes: EOM are normal. Pupils are equal, round, and reactive to light. No scleral icterus.  Neck: Normal range of motion. Neck supple. Carotid bruit is present (b/l systolic from chest). No tracheal deviation present. No thyromegaly present.  Cardiovascular: Normal rate, regular rhythm and intact distal pulses. Exam reveals no gallop and no friction rub.  Murmur (2/6 SEM--> carotid b/l) heard. No LE edema b/l. No calf TTP  Pulmonary/Chest: Effort normal and breath sounds normal. No respiratory distress. He has no wheezes. He has no rales. He exhibits no tenderness.  No rhonchi  Abdominal: Soft. Bowel sounds are normal. He exhibits no distension and no mass. There is no hepatosplenomegaly or hepatomegaly. There is no tenderness. There is no rebound and no guarding. No hernia.  Genitourinary:  Genitourinary Comments: Deferred to urology. Last prostates exam in Jan 2019 by uro revealed BPH  Musculoskeletal: He exhibits edema (multiple small and large joints) and tenderness. He exhibits no deformity.  R>L 1st MTP joint swelling, reduced ROM and TTP  Lymphadenopathy:    He has no cervical adenopathy.  Neurological: He is alert and oriented to person, place, and time.  Skin: Skin is warm and dry. No rash noted.  Skin is very dry with multiple excoriations  Psychiatric: He has a normal mood and affect. His behavior is normal. Judgment and thought content normal.  Vitals reviewed.   Labs reviewed:  Basic Metabolic Panel: Recent Labs    06/25/16 1119 12/04/16 1250  NA 140  139  K 4.8 4.2  CL 102 103  CO2 28 27  GLUCOSE 90 97  BUN 15 12  CREATININE 1.39* 1.34*  CALCIUM 9.7 9.2  TSH  --  3.07   Liver Function Tests: Recent Labs    06/25/16 1119 12/04/16 1250  AST 21  --   ALT 19 20  ALKPHOS 94  --   BILITOT 0.8  --   PROT 6.8  --   ALBUMIN 4.5  --    Recent Labs    06/25/16 1119  LIPASE 21  AMYLASE 78   No results for input(s): AMMONIA in the last 8760 hours. CBC: Recent Labs    06/25/16 1119  WBC 5.6  NEUTROABS 3,808  HGB 16.1  HCT 47.5  MCV 94.8  PLT 210   Lipid Panel: Recent Labs    12/04/16 1250  CHOL 143  HDL 57  LDLCALC 43  TRIG 213*  CHOLHDL 2.5   No  results found for: HGBA1C  Procedures: No results found.  Assessment/Plan   ICD-10-CM   1. Well adult exam Z00.00   2. Dry skin L85.3 Sedimentation Rate  3. Essential hypertension, benign I10 CBC with Differential/Platelets  4. Hypothyroidism, unspecified type E03.9 CBC with Differential/Platelets    TSH    T4, Free  5. Mixed hyperlipidemia E78.2 Lipid Panel  6. Late onset Alzheimer's disease without behavioral disturbance G30.1    F02.80   7. High risk medication use Z79.899 CMP with eGFR([facility])  8. Pain in joint involving multiple sites M25.50 Sedimentation Rate   Bring copy of [place] and/or Living Will to next appointment.   Use moisturizing lotion (aveeno, eucerin cream, aquaphor, curel) daily for dry skin. May need dermatology follow up if no better  Continue current medications as ordered  Will call with lab results  Follow up with specialists as scheduled  Continue diet and exercise program  Briefly discussed code status - he prefers FULL CODE. He has an advanced directive  Follow up in 6 mos for thyroid, HTN, hyperlipidemia, BPH, foot pain   Deshannon Hinchliffe S. Perlie Gold  [facility] [address] [place], [place] [address] [phone] Cell  (Monday-Friday 8 AM - 5 PM) [phone] After 5 PM and follow prompts

## 2017-06-07 LAB — LIPID PANEL
CHOL/HDL RATIO: 2.4 (calc) (ref <5.0)
CHOLESTEROL: 165 mg/dL (ref <200)
HDL: 70 mg/dL (ref >40)
LDL CHOLESTEROL (CALC): 73 mg/dL
Non-HDL Cholesterol (Calc): 95 mg/dL (calc) (ref <130)
Triglycerides: 140 mg/dL (ref <150)

## 2017-06-07 LAB — CBC WITH DIFFERENTIAL/PLATELET
BASOS PCT: 0.4 %
Basophils Absolute: 22 cells/uL (ref 0–200)
EOS PCT: 5.8 %
Eosinophils Absolute: 313 cells/uL (ref 15–500)
HCT: 42.6 % (ref 38.5–50.0)
Hemoglobin: 14.7 g/dL (ref 13.2–17.1)
Lymphs Abs: 1485 cells/uL (ref 850–3900)
MCH: 32.4 pg (ref 27.0–33.0)
MCHC: 34.5 g/dL (ref 32.0–36.0)
MCV: 93.8 fL (ref 80.0–100.0)
MONOS PCT: 6.4 %
MPV: 10.8 fL (ref 7.5–12.5)
NEUTROS ABS: 3235 {cells}/uL (ref 1500–7800)
Neutrophils Relative %: 59.9 %
PLATELETS: 216 10*3/uL (ref 140–400)
RBC: 4.54 10*6/uL (ref 4.20–5.80)
RDW: 12.4 % (ref 11.0–15.0)
TOTAL LYMPHOCYTE: 27.5 %
WBC mixed population: 346 cells/uL (ref 200–950)
WBC: 5.4 10*3/uL (ref 3.8–10.8)

## 2017-06-07 LAB — COMPLETE METABOLIC PANEL WITH GFR
AG RATIO: 2.2 (calc) (ref 1.0–2.5)
ALKALINE PHOSPHATASE (APISO): 104 U/L (ref 40–115)
ALT: 23 U/L (ref 9–46)
AST: 23 U/L (ref 10–35)
Albumin: 4.2 g/dL (ref 3.6–5.1)
BILIRUBIN TOTAL: 0.7 mg/dL (ref 0.2–1.2)
BUN/Creatinine Ratio: 9 (calc) (ref 6–22)
BUN: 12 mg/dL (ref 7–25)
CHLORIDE: 103 mmol/L (ref 98–110)
CO2: 28 mmol/L (ref 20–32)
Calcium: 9.6 mg/dL (ref 8.6–10.3)
Creat: 1.31 mg/dL — ABNORMAL HIGH (ref 0.70–1.11)
GFR, Est African American: 59 mL/min/{1.73_m2} — ABNORMAL LOW (ref >=60)
GFR, Est Non African American: 51 mL/min/{1.73_m2} — ABNORMAL LOW (ref >=60)
Globulin: 1.9 g/dL (calc) (ref 1.9–3.7)
Glucose, Bld: 91 mg/dL (ref 65–139)
POTASSIUM: 4.2 mmol/L (ref 3.5–5.3)
SODIUM: 139 mmol/L (ref 135–146)
Total Protein: 6.1 g/dL (ref 6.1–8.1)

## 2017-06-07 LAB — SEDIMENTATION RATE: Sed Rate: 2 mm/h (ref 0–20)

## 2017-06-07 LAB — TSH: TSH: 4.63 mIU/L — ABNORMAL HIGH (ref 0.40–4.50)

## 2017-06-07 LAB — T4, FREE: FREE T4: 1.3 ng/dL (ref 0.8–1.8)

## 2017-06-10 ENCOUNTER — Telehealth: Payer: Self-pay

## 2017-06-10 DIAGNOSIS — E039 Hypothyroidism, unspecified: Secondary | ICD-10-CM

## 2017-06-10 MED ORDER — LEVOTHYROXINE SODIUM 88 MCG PO TABS: 88.0000 ug | ORAL_TABLET | Freq: Every day | ORAL | 2 refills | Status: DC

## 2017-06-10 NOTE — Telephone Encounter (Signed)
-----   Message from [place], [place] sent at 06/08/2017 10:58 PM EST ----- Thyroid not at goal - increase levothyroxine 44mcg #30 take 1 tab po daily with 2 RF; reck TSH in 6 weeks; kidney function reduced but stable; other labs stable; follow up as scheduled

## 2017-06-10 NOTE — Telephone Encounter (Signed)
Medication list was updated and Rx was sent to pharmacy. Repeat TSH has been ordered. Patient will call to schedule appointment.

## 2017-06-24 ENCOUNTER — Other Ambulatory Visit: Payer: Self-pay | Admitting: Internal Medicine

## 2017-06-24 DIAGNOSIS — F3342 Major depressive disorder, recurrent, in full remission: Secondary | ICD-10-CM

## 2017-06-24 DIAGNOSIS — F5101 Primary insomnia: Secondary | ICD-10-CM

## 2017-06-24 NOTE — Telephone Encounter (Signed)
A medication refill was received from pharmacy for zolpidem 10 mg and alprazolam 0.5 mg. Rx was called in to pharmacy after verifying last fill date, provider, and quantity on PMP [place] database.   

## 2017-07-15 ENCOUNTER — Encounter: Payer: Self-pay | Admitting: Nurse Practitioner

## 2017-07-15 ENCOUNTER — Ambulatory Visit (INDEPENDENT_AMBULATORY_CARE_PROVIDER_SITE_OTHER): Payer: Medicare Other | Admitting: Nurse Practitioner

## 2017-07-15 VITALS — BP 136/84 | HR 61 | Temp 98.7°F | Ht 70.0 in | Wt 189.0 lb

## 2017-07-15 DIAGNOSIS — R600 Localized edema: Secondary | ICD-10-CM | POA: Diagnosis not present

## 2017-07-15 DIAGNOSIS — I872 Venous insufficiency (chronic) (peripheral): Secondary | ICD-10-CM | POA: Diagnosis not present

## 2017-07-15 DIAGNOSIS — J0140 Acute pansinusitis, unspecified: Secondary | ICD-10-CM

## 2017-07-15 MED ORDER — BENZONATATE 100 MG PO CAPS: 100.0000 mg | ORAL_CAPSULE | Freq: Three times a day (TID) | ORAL | 0 refills | Status: DC | PRN

## 2017-07-15 MED ORDER — AMOXICILLIN-POT CLAVULANATE 875-125 MG PO TABS: 1.0000 | ORAL_TABLET | Freq: Two times a day (BID) | ORAL | 0 refills | Status: DC

## 2017-07-15 NOTE — Progress Notes (Signed)
Careteam: Patient Care Team: Gildardo Cranker, DO as PCP - General (Internal Medicine) Clent Jacks, MD as Consulting Physician (Ophthalmology) Lamonte Sakai Rose Fillers, MD as Consulting Physician (Pulmonary Disease) Angelia Mould, MD as Consulting Physician (Vascular Surgery) Earlie Server, MD as Consulting Physician (Orthopedic Surgery) Wilford Corner, MD as Consulting Physician (Gastroenterology) Josue Hector, MD as Consulting Physician (Cardiology) Chesley Mires, MD as Consulting Physician (Pulmonary Disease)  Advanced Directive information Does Patient Have a Medical Advance Directive?: No, Does patient want to make changes to medical advance directive?: No - Patient declined  Allergies  Allergen Reactions  . Iodine Swelling    Internal iodine  . Iohexol Hives         Chief Complaint  Patient presents with  . Acute Visit    Pt is being seen due to head congestion, cough for about 7 days. Pt OTC decongestant but it is not working now.      HPI: Patient is a 83 y.o. male seen in the office today for head congestion for 1 week.  Been using mucinex for around 1 week without help.  Last night he coughed most the night and was stopped up.  No fever or chills.  Feels like congestion (nasal) was getting better but now getting worst  Review of Systems:  Review of Systems  Constitutional: Positive for malaise/fatigue. Negative for chills and fever.  HENT: Positive for congestion. Negative for ear pain, hearing loss, sinus pain, sore throat and tinnitus.   Respiratory: Positive for cough, sputum production and wheezing (not currently). Negative for shortness of breath.   Cardiovascular: Negative for chest pain and palpitations.  Neurological: Positive for weakness. Negative for dizziness and headaches.    Past Medical History:  Diagnosis Date  . Actinic keratosis   . Alzheimer's disease   . Anal fissure   . Anxiety state, unspecified   . Benign neoplasm of  colon   . BENIGN PROSTATIC HYPERTROPHY, HX OF   . BPH (benign prostatic hypertrophy) with urinary obstruction 06/22/2008   Qualifier: Diagnosis of  By: Johnsie Cancel, MD, Rona Ravens   . Cerebrovascular disease, unspecified   . Coronary atherosclerosis of native coronary artery   . Depressive disorder, not elsewhere classified   . Diverticulosis of colon (without mention of hemorrhage)   . Essential hypertension, benign   . External hemorrhoids without mention of complication   . Insomnia, unspecified   . Mixed hyperlipidemia   . Osteoarthrosis, unspecified whether generalized or localized, unspecified site   . Other premature beats   . Other psoriasis   . Pain in joint, lower leg   . Pain in joint, site unspecified   . Palpitations   . Pressure ulcer   . Reflux esophagitis   . Unspecified essential hypertension   . Unspecified hereditary and idiopathic peripheral neuropathy   . Unspecified hypothyroidism   . Unspecified vitamin D deficiency    Past Surgical History:  Procedure Laterality Date  . CERVICAL DISC SURGERY    . EYE SURGERY Left 02/07/2014   Cataract Surgery Left Eye  . KNEE SURGERY  07/18/2006   Dr French Ana   . LAMINECTOMY C3 disc  1972  . LEFT HEART CATHETERIZATION WITH CORONARY ANGIOGRAM N/A 01/28/2013   Procedure: LEFT HEART CATHETERIZATION WITH CORONARY ANGIOGRAM;  Surgeon: Josue Hector, MD;  Location: [facility] CATH LAB;  Service: Cardiovascular;  Laterality: N/A;  . MULTIPLE TOOTH EXTRACTIONS  2014  . RETINAL TEAR REPAIR CRYOTHERAPY     Dr Wayne Sever  Social History:   reports that he quit smoking about 25 years ago. His smoking use included cigarettes. He has a 50.00 pack-year smoking history. He has never used smokeless tobacco. He reports that he drinks alcohol. He reports that he does not use drugs.  Family History  Problem Relation Age of Onset  . Cancer Father        pancreatic  . Cancer Mother        pancreatic    Medications: Patient's Medications    New Prescriptions   No medications on file  Previous Medications   ALPRAZOLAM (XANAX) 0.5 MG TABLET    TAKE 1 TABLET BY MOUTH TWICE DAILY AS NEEDED   ASPIRIN EC 81 MG TABLET    Take 81 mg by mouth at bedtime.   B COMPLEX VITAMINS TABLET    Take 1 tablet by mouth daily.   CETIRIZINE (ZYRTEC) 10 MG TABLET    Take 1 tablet (10 mg total) by mouth daily.   CHOLECALCIFEROL (VITAMIN D) 1000 UNITS TABLET    Take 1,000 Units by mouth daily.   DILTIAZEM (CARDIZEM CD) 240 MG 24 HR CAPSULE    Take 1 capsule (240 mg total) by mouth daily.   GALANTAMINE (RAZADYNE ER) 24 MG 24 HR CAPSULE    TAKE ONE CAPSULE EVERY DAY WITH BREAKFAST   ISOSORBIDE MONONITRATE (IMDUR) 30 MG 24 HR TABLET    Take 1 tablet (30 mg total) by mouth daily.   LEVOTHYROXINE (SYNTHROID, LEVOTHROID) 88 MCG TABLET    Take 1 tablet (88 mcg total) by mouth daily before breakfast.   LOVASTATIN (MEVACOR) 40 MG TABLET    Take one tablet by mouth once daily at bedtime as needed for cholesterol   MAGNESIUM PO    Take by mouth daily.   MEMANTINE (NAMENDA) 10 MG TABLET    Take 10 mg by mouth 2 (two) times daily.   MIRABEGRON ER (MYRBETRIQ) 50 MG TB24 TABLET    Take 50 mg by mouth every other day.   MULTIPLE VITAMIN (MULTIVITAMIN WITH MINERALS) TABS TABLET    Take 1 tablet by mouth daily.   NITROGLYCERIN (NITROSTAT) 0.4 MG SL TABLET    One under the tongue if needed for chest tightness. Repeat in 5 minutes if needed. Repeat again in 5 minutes if needed.   SERTRALINE (ZOLOFT) 50 MG TABLET    TAKE 1 TABLET BY MOUTH ONCE DAILY   TAMSULOSIN (FLOMAX) 0.4 MG CAPS CAPSULE    TAKE ONE CAPSULE BY MOUTH ONCE DAILY TO HELP RELIEVE OBSTRUCTION   TRAZODONE (DESYREL) 100 MG TABLET    TAKE 1 TABLET BY MOUTH AT BEDTIME TO  HELP  REST   ZINC GLUCONATE 50 MG TABLET    Take 50 mg by mouth at bedtime.   ZOLPIDEM (AMBIEN) 10 MG TABLET    TAKE 1 TABLET BY MOUTH ONCE DAILY AT BEDTIME  Modified Medications   No medications on file  Discontinued Medications   No  medications on file     Physical Exam:  Vitals:   07/15/17 1142  BP: 136/84  Pulse: 61  Temp: 98.7 F (37.1 C)  TempSrc: Oral  SpO2: 96%  Weight: 189 lb (85.7 kg)  Height: 5\' 10"  (1.778 m)   Body mass index is 27.12 kg/m.  Physical Exam  Constitutional: He is oriented to person, place, and time. He appears well-developed and well-nourished.  HENT:  Head: Normocephalic and atraumatic.  Right Ear: External ear normal.  Left Ear: External ear normal.  Nose:  Nose normal.  Mouth/Throat: Oropharynx is clear and moist. No oropharyngeal exudate.  Cardiovascular: Normal rate and regular rhythm.  Pulmonary/Chest: Effort normal and breath sounds normal. No stridor. No respiratory distress.  Musculoskeletal: Normal range of motion. He exhibits edema (trace). He exhibits no deformity.  Neurological: He is alert and oriented to person, place, and time.    Labs reviewed: Basic Metabolic Panel: Recent Labs    12/04/16 1250 06/06/17 1051  NA 139 139  K 4.2 4.2  CL 103 103  CO2 27 28  GLUCOSE 97 91  BUN 12 12  CREATININE 1.34* 1.31*  CALCIUM 9.2 9.6  TSH 3.07 4.63*   Liver Function Tests: Recent Labs    12/04/16 1250 06/06/17 1051  AST  --  23  ALT 20 23  BILITOT  --  0.7  PROT  --  6.1   No results for input(s): LIPASE, AMYLASE in the last 8760 hours. No results for input(s): AMMONIA in the last 8760 hours. CBC: Recent Labs    06/06/17 1051  WBC 5.4  NEUTROABS 3,235  HGB 14.7  HCT 42.6  MCV 93.8  PLT 216   Lipid Panel: Recent Labs    12/04/16 1250 06/06/17 1051  CHOL 143 165  HDL 57 70  LDLCALC 43 73  TRIG 213* 140  CHOLHDL 2.5 2.4   TSH: Recent Labs    12/04/16 1250 06/06/17 1051  TSH 3.07 4.63*   A1C: No results found for: HGBA1C   Assessment/Plan 1. Acute non-recurrent pansinusitis neti pot twice daily Plain nasal saline spray throughout the day as needed May use tylenol 325 mg 2 tablets every 6 hours as needed aches and pains or  sore throat humidifier in the home to help with the dry air Mucinex DM by mouth twice daily as needed for cough and congestion with full glass of water  Keep well hydrated To use benzonatate 100 mg by mouth every 8 hours as needed cough Avoid forcefully blowing nose - amoxicillin-clavulanate (AUGMENTIN) 875-125 MG tablet; Take 1 tablet by mouth 2 (two) times daily.  Dispense: 14 tablet; Refill: 0 - benzonatate (TESSALON) 100 MG capsule; Take 1 capsule (100 mg total) by mouth 3 (three) times daily as needed for cough.  Dispense: 30 capsule; Refill: 0  2. Edema of both lower extremities due to peripheral venous insufficiency Trace edema noted, education provided.   Next appt: 07/22/2017 Carlos American. [place], [facility] [phone]

## 2017-07-15 NOTE — Patient Instructions (Addendum)
neti pot twice daily Plain nasal saline spray throughout the day as needed May use tylenol 325 mg 2 tablets every 6 hours as needed aches and pains or sore throat humidifier in the home to help with the dry air Mucinex DM by mouth twice daily for 7 days then as needed for cough and congestion with full glass of water  Keep well hydrated Avoid forcefully blowing nose as this makes congestion worse To use flonase twice daily  To start augmentin 875/125 mg by mouth twice daily with full glass of water for 1 week- take all that is prescribed and use probiotic to help GI upset  Take with food      Sinusitis, Adult Sinusitis is soreness and inflammation of your sinuses. Sinuses are hollow spaces in the bones around your face. Your sinuses are located:  Around your eyes.  In the middle of your forehead.  Behind your nose.  In your cheekbones.  Your sinuses and nasal passages are lined with a stringy fluid (mucus). Mucus normally drains out of your sinuses. When your nasal tissues become inflamed or swollen, the mucus can become trapped or blocked so air cannot flow through your sinuses. This allows bacteria, viruses, and funguses to grow, which leads to infection. Sinusitis can develop quickly and last for 7?10 days (acute) or for more than 12 weeks (chronic). Sinusitis often develops after a cold. What are the causes? This condition is caused by anything that creates swelling in the sinuses or stops mucus from draining, including:  Allergies.  Asthma.  Bacterial or viral infection.  Abnormally shaped bones between the nasal passages.  Nasal growths that contain mucus (nasal polyps).  Narrow sinus openings.  Pollutants, such as chemicals or irritants in the air.  A foreign object stuck in the nose.  A fungal infection. This is rare.  What increases the risk? The following factors may make you more likely to develop this condition:  Having allergies or asthma.  Having had  a recent cold or respiratory tract infection.  Having structural deformities or blockages in your nose or sinuses.  Having a weak immune system.  Doing a lot of swimming or diving.  Overusing nasal sprays.  Smoking.  What are the signs or symptoms? The main symptoms of this condition are pain and a feeling of pressure around the affected sinuses. Other symptoms include:  Upper toothache.  Earache.  Headache.  Bad breath.  Decreased sense of smell and taste.  A cough that may get worse at night.  Fatigue.  Fever.  Thick drainage from your nose. The drainage is often green and it may contain pus (purulent).  Stuffy nose or congestion.  Postnasal drip. This is when extra mucus collects in the throat or back of the nose.  Swelling and warmth over the affected sinuses.  Sore throat.  Sensitivity to light.  How is this diagnosed? This condition is diagnosed based on symptoms, a medical history, and a physical exam. To find out if your condition is acute or chronic, your health care provider may:  Look in your nose for signs of nasal polyps.  Tap over the affected sinus to check for signs of infection.  View the inside of your sinuses using an imaging device that has a light attached (endoscope).  If your health care provider suspects that you have chronic sinusitis, you may also:  Be tested for allergies.  Have a sample of mucus taken from your nose (nasal culture) and checked for bacteria.  Have a mucus sample examined to see if your sinusitis is related to an allergy.  If your sinusitis does not respond to treatment and it lasts longer than 8 weeks, you may have an MRI or CT scan to check your sinuses. These scans also help to determine how severe your infection is. In rare cases, a bone biopsy may be done to rule out more serious types of fungal sinus disease. How is this treated? Treatment for sinusitis depends on the cause and whether your condition is  chronic or acute. If a virus is causing your sinusitis, your symptoms will go away on their own within 10 days. You may be given medicines to relieve your symptoms, including:  Topical nasal decongestants. They shrink swollen nasal passages and let mucus drain from your sinuses.  Antihistamines. These drugs block inflammation that is triggered by allergies. This can help to ease swelling in your nose and sinuses.  Topical nasal corticosteroids. These are nasal sprays that ease inflammation and swelling in your nose and sinuses.  Nasal saline washes. These rinses can help to get rid of thick mucus in your nose.  If your condition is caused by bacteria, you will be given an antibiotic medicine. If your condition is caused by a fungus, you will be given an antifungal medicine. Surgery may be needed to correct underlying conditions, such as narrow nasal passages. Surgery may also be needed to remove polyps. Follow these instructions at home: Medicines  Take, use, or apply over-the-counter and prescription medicines only as told by your health care provider. These may include nasal sprays.  If you were prescribed an antibiotic medicine, take it as told by your health care provider. Do not stop taking the antibiotic even if you start to feel better. Hydrate and Humidify  Drink enough water to keep your urine clear or pale yellow. Staying hydrated will help to thin your mucus.  Use a cool mist humidifier to keep the humidity level in your home above 50%.  Inhale steam for 10-15 minutes, 3-4 times a day or as told by your health care provider. You can do this in the bathroom while a hot shower is running.  Limit your exposure to cool or dry air. Rest  Rest as much as possible.  Sleep with your head raised (elevated).  Make sure to get enough sleep each night. General instructions  Apply a warm, moist washcloth to your face 3-4 times a day or as told by your health care provider. This will  help with discomfort.  Wash your hands often with soap and water to reduce your exposure to viruses and other germs. If soap and water are not available, use hand sanitizer.  Do not smoke. Avoid being around people who are smoking (secondhand smoke).  Keep all follow-up visits as told by your health care provider. This is important. Contact a health care provider if:  You have a fever.  Your symptoms get worse.  Your symptoms do not improve within 10 days. Get help right away if:  You have a severe headache.  You have persistent vomiting.  You have pain or swelling around your face or eyes.  You have vision problems.  You develop confusion.  Your neck is stiff.  You have trouble breathing. This information is not intended to replace advice given to you by your health care provider. Make sure you discuss any questions you have with your health care provider. Document Released: 04/08/2005 Document Revised: 12/03/2015 Document Reviewed: 02/01/2015 Elsevier  Interactive Patient Education  2018 [place]. Edema Edema is when you have too much fluid in your body or under your skin. Edema may make your legs, feet, and ankles swell up. Swelling is also common in looser tissues, like around your eyes. This is a common condition. It gets more common as you get older. There are many possible causes of edema. Eating too much salt (sodium) and being on your feet or sitting for a long time can cause edema in your legs, feet, and ankles. Hot weather may make edema worse. Edema is usually painless. Your skin may look swollen or shiny. Follow these instructions at home:  Keep the swollen body part raised (elevated) above the level of your heart when you are sitting or lying down.  Do not sit still or stand for a long time.  Do not wear tight clothes. Do not wear garters on your upper legs.  Exercise your legs. This can help the swelling go down.  Wear elastic bandages or support  stockings as told by your doctor.  Eat a low-salt (low-sodium) diet to reduce fluid as told by your doctor.  Depending on the cause of your swelling, you may need to limit how much fluid you drink (fluid restriction).  Take over-the-counter and prescription medicines only as told by your doctor. Contact a doctor if:  Treatment is not working.  You have heart, liver, or kidney disease and have symptoms of edema.  You have sudden and unexplained weight gain. Get help right away if:  You have shortness of breath or chest pain.  You cannot breathe when you lie down.  You have pain, redness, or warmth in the swollen areas.  You have heart, liver, or kidney disease and get edema all of a sudden.  You have a fever and your symptoms get worse all of a sudden. Summary  Edema is when you have too much fluid in your body or under your skin.  Edema may make your legs, feet, and ankles swell up. Swelling is also common in looser tissues, like around your eyes.  Raise (elevate) the swollen body part above the level of your heart when you are sitting or lying down.  Follow your doctor's instructions about diet and how much fluid you can drink (fluid restriction). This information is not intended to replace advice given to you by your health care provider. Make sure you discuss any questions you have with your health care provider. Document Released: 09/25/2007 Document Revised: 04/26/2016 Document Reviewed: 04/26/2016 Elsevier Interactive Patient Education  2017 [employer].

## 2017-07-22 ENCOUNTER — Other Ambulatory Visit: Payer: Medicare Other

## 2017-07-22 DIAGNOSIS — E039 Hypothyroidism, unspecified: Secondary | ICD-10-CM | POA: Diagnosis not present

## 2017-07-22 LAB — TSH: TSH: 2.8 mIU/L (ref 0.40–4.50)

## 2017-07-24 ENCOUNTER — Other Ambulatory Visit: Payer: Self-pay | Admitting: Internal Medicine

## 2017-07-24 DIAGNOSIS — F5101 Primary insomnia: Secondary | ICD-10-CM

## 2017-07-24 NOTE — Telephone Encounter (Signed)
A medication refill was received from pharmacy for zolpidem 10 mg. Rx was called in to pharmacy after verifying last fill date, provider, and quantity on [place] database.   

## 2017-08-06 ENCOUNTER — Encounter: Payer: Self-pay | Admitting: Nurse Practitioner

## 2017-08-06 ENCOUNTER — Ambulatory Visit (INDEPENDENT_AMBULATORY_CARE_PROVIDER_SITE_OTHER): Payer: Medicare Other | Admitting: Nurse Practitioner

## 2017-08-06 VITALS — BP 134/82 | HR 65 | Temp 98.0°F | Ht 70.0 in | Wt 182.0 lb

## 2017-08-06 DIAGNOSIS — R059 Cough, unspecified: Secondary | ICD-10-CM

## 2017-08-06 DIAGNOSIS — Z9109 Other allergy status, other than to drugs and biological substances: Secondary | ICD-10-CM | POA: Diagnosis not present

## 2017-08-06 DIAGNOSIS — R05 Cough: Secondary | ICD-10-CM | POA: Diagnosis not present

## 2017-08-06 MED ORDER — BENZONATATE 200 MG PO CAPS: 200.0000 mg | ORAL_CAPSULE | Freq: Three times a day (TID) | ORAL | 0 refills | Status: DC | PRN

## 2017-08-06 NOTE — Patient Instructions (Addendum)
Start flonase 1 spray twice daily Tessalon pearls 200 mg by mouth three time daily as needed for cough.  Norel AD (behind the counter) by mouth every 6 hours as needed for congestion

## 2017-08-06 NOTE — Progress Notes (Signed)
Careteam: Patient Care Team: Gildardo Cranker, DO as PCP - General (Internal Medicine) Clent Jacks, MD as Consulting Physician (Ophthalmology) Lamonte Sakai Rose Fillers, MD as Consulting Physician (Pulmonary Disease) Angelia Mould, MD as Consulting Physician (Vascular Surgery) Earlie Server, MD as Consulting Physician (Orthopedic Surgery) Wilford Corner, MD as Consulting Physician (Gastroenterology) Josue Hector, MD as Consulting Physician (Cardiology) Chesley Mires, MD as Consulting Physician (Pulmonary Disease)  Advanced Directive information    Allergies  Allergen Reactions  . Iodine Swelling    Internal iodine  . Iohexol Hives         Chief Complaint  Patient presents with  . Acute Visit    Pt is being seen due to head congestion, cough since last visit on 3/36/19.      HPI: Patient is a 83 y.o. male seen in the office today due to ongoing nasal congestion.  Took Augmentin and tessalon pearls and did not feel like it may much of a difference. Has been ongoing since 07/15/17.  Also with ongoing cough and white mucous.  Overall feels well with good energy, Still playing tennis twice weekly No sore or scratchy throat.  No  New or increase in hearing loss. Has had a buzzing in ear but not constant.  Taking mucinex DM routinely which is not helping.  Cough bothering him the most.   Review of Systems:  Review of Systems  Constitutional: Negative for chills, fever and malaise/fatigue.  HENT: Positive for congestion. Negative for ear discharge, ear pain, hearing loss, sinus pain, sore throat and tinnitus.   Respiratory: Positive for cough. Negative for sputum production and shortness of breath.   Cardiovascular: Negative for chest pain and leg swelling.  Neurological: Negative for dizziness and headaches.    Past Medical History:  Diagnosis Date  . Actinic keratosis   . Alzheimer's disease   . Anal fissure   . Anxiety state, unspecified   . Benign neoplasm  of colon   . BENIGN PROSTATIC HYPERTROPHY, HX OF   . BPH (benign prostatic hypertrophy) with urinary obstruction 06/22/2008   Qualifier: Diagnosis of  By: Johnsie Cancel, MD, Rona Ravens   . Cerebrovascular disease, unspecified   . Coronary atherosclerosis of native coronary artery   . Depressive disorder, not elsewhere classified   . Diverticulosis of colon (without mention of hemorrhage)   . Essential hypertension, benign   . External hemorrhoids without mention of complication   . Insomnia, unspecified   . Mixed hyperlipidemia   . Osteoarthrosis, unspecified whether generalized or localized, unspecified site   . Other premature beats   . Other psoriasis   . Pain in joint, lower leg   . Pain in joint, site unspecified   . Palpitations   . Pressure ulcer   . Reflux esophagitis   . Unspecified essential hypertension   . Unspecified hereditary and idiopathic peripheral neuropathy   . Unspecified hypothyroidism   . Unspecified vitamin D deficiency    Past Surgical History:  Procedure Laterality Date  . CERVICAL DISC SURGERY    . EYE SURGERY Left 02/07/2014   Cataract Surgery Left Eye  . KNEE SURGERY  07/18/2006   Dr French Ana   . LAMINECTOMY C3 disc  1972  . LEFT HEART CATHETERIZATION WITH CORONARY ANGIOGRAM N/A 01/28/2013   Procedure: LEFT HEART CATHETERIZATION WITH CORONARY ANGIOGRAM;  Surgeon: Josue Hector, MD;  Location[facility] CATH LAB;  Service: Cardiovascular;  Laterality: N/A;  . MULTIPLE TOOTH EXTRACTIONS  2014  . RETINAL TEAR REPAIR CRYOTHERAPY  Dr Wayne Sever    Social History:   reports that he quit smoking about 25 years ago. His smoking use included cigarettes. He has a 50.00 pack-year smoking history. He has never used smokeless tobacco. He reports that he drinks alcohol. He reports that he does not use drugs.  Family History  Problem Relation Age of Onset  . Cancer Father        pancreatic  . Cancer Mother        pancreatic    Medications: Patient's Medications    New Prescriptions   No medications on file  Previous Medications   ALPRAZOLAM (XANAX) 0.5 MG TABLET    TAKE 1 TABLET BY MOUTH TWICE DAILY AS NEEDED   ASPIRIN EC 81 MG TABLET    Take 81 mg by mouth at bedtime.   B COMPLEX VITAMINS TABLET    Take 1 tablet by mouth daily.   CETIRIZINE (ZYRTEC) 10 MG TABLET    Take 1 tablet (10 mg total) by mouth daily.   CHOLECALCIFEROL (VITAMIN D) 1000 UNITS TABLET    Take 1,000 Units by mouth daily.   DILTIAZEM (CARDIZEM CD) 240 MG 24 HR CAPSULE    Take 1 capsule (240 mg total) by mouth daily.   GALANTAMINE (RAZADYNE ER) 24 MG 24 HR CAPSULE    TAKE ONE CAPSULE EVERY DAY WITH BREAKFAST   ISOSORBIDE MONONITRATE (IMDUR) 30 MG 24 HR TABLET    Take 1 tablet (30 mg total) by mouth daily.   LEVOTHYROXINE (SYNTHROID, LEVOTHROID) 88 MCG TABLET    Take 1 tablet (88 mcg total) by mouth daily before breakfast.   LOVASTATIN (MEVACOR) 40 MG TABLET    Take one tablet by mouth once daily at bedtime as needed for cholesterol   MAGNESIUM PO    Take by mouth daily.   MEMANTINE (NAMENDA) 10 MG TABLET    Take 10 mg by mouth 2 (two) times daily.   MIRABEGRON ER (MYRBETRIQ) 50 MG TB24 TABLET    Take 50 mg by mouth every other day.   MULTIPLE VITAMIN (MULTIVITAMIN WITH MINERALS) TABS TABLET    Take 1 tablet by mouth daily.   NITROGLYCERIN (NITROSTAT) 0.4 MG SL TABLET    One under the tongue if needed for chest tightness. Repeat in 5 minutes if needed. Repeat again in 5 minutes if needed.   SERTRALINE (ZOLOFT) 50 MG TABLET    TAKE 1 TABLET BY MOUTH ONCE DAILY   TAMSULOSIN (FLOMAX) 0.4 MG CAPS CAPSULE    TAKE ONE CAPSULE BY MOUTH ONCE DAILY TO HELP RELIEVE OBSTRUCTION   TRAZODONE (DESYREL) 100 MG TABLET    TAKE 1 TABLET BY MOUTH AT BEDTIME TO  HELP  REST   ZINC GLUCONATE 50 MG TABLET    Take 50 mg by mouth at bedtime.   ZOLPIDEM (AMBIEN) 10 MG TABLET    TAKE 1 TABLET BY MOUTH ONCE DAILY AT BEDTIME  Modified Medications   No medications on file  Discontinued Medications    AMOXICILLIN-CLAVULANATE (AUGMENTIN) 875-125 MG TABLET    Take 1 tablet by mouth 2 (two) times daily.   BENZONATATE (TESSALON) 100 MG CAPSULE    Take 1 capsule (100 mg total) by mouth 3 (three) times daily as needed for cough.     Physical Exam:  Vitals:   08/06/17 1041  BP: 134/82  Pulse: 65  Temp: 98 F (36.7 C)  TempSrc: Oral  SpO2: 99%  Weight: 182 lb (82.6 kg)  Height: 5\' 10"  (1.778 m)   Body  mass index is 26.11 kg/m.  Physical Exam  Constitutional: He is oriented to person, place, and time. He appears well-developed and well-nourished.  HENT:  Head: Normocephalic and atraumatic.  Right Ear: Tympanic membrane and external ear normal.  Left Ear: Tympanic membrane and external ear normal.  Nose: Rhinorrhea present.  Mouth/Throat: Oropharynx is clear and moist. No oropharyngeal exudate.  Eyes: Pupils are equal, round, and reactive to light. Conjunctivae and EOM are normal.  Neck: Neck supple.  Cardiovascular: Normal rate and regular rhythm.  Pulmonary/Chest: Effort normal and breath sounds normal. No stridor. No respiratory distress.  Musculoskeletal: He exhibits no deformity.  Lymphadenopathy:    He has no cervical adenopathy.  Neurological: He is alert and oriented to person, place, and time.    Labs reviewed: Basic Metabolic Panel: Recent Labs    12/04/16 1250 06/06/17 1051 07/22/17 1348  NA 139 139  --   K 4.2 4.2  --   CL 103 103  --   CO2 27 28  --   GLUCOSE 97 91  --   BUN 12 12  --   CREATININE 1.34* 1.31*  --   CALCIUM 9.2 9.6  --   TSH 3.07 4.63* 2.80   Liver Function Tests: Recent Labs    12/04/16 1250 06/06/17 1051  AST  --  23  ALT 20 23  BILITOT  --  0.7  PROT  --  6.1   No results for input(s): LIPASE, AMYLASE in the last 8760 hours. No results for input(s): AMMONIA in the last 8760 hours. CBC: Recent Labs    06/06/17 1051  WBC 5.4  NEUTROABS 3,235  HGB 14.7  HCT 42.6  MCV 93.8  PLT 216   Lipid Panel: Recent Labs     12/04/16 1250 06/06/17 1051  CHOL 143 165  HDL 57 70  LDLCALC 43 73  TRIG 213* 140  CHOLHDL 2.5 2.4   TSH: Recent Labs    12/04/16 1250 06/06/17 1051 07/22/17 1348  TSH 3.07 4.63* 2.80   A1C: No results found for: HGBA1C   Assessment/Plan 1. Environmental allergies -hx of allergies, has zyrtec on medication list but unsure if he is taking.  -to use flonase 1 spray  twice daily in bilateral nares -can use norel AD by mouth every 6 hours as needed increase congestion  2. Cough -most likely post-viral cough, encouraged proper hydration, can try to increase benzonatate to 200 mg by mouth every 8 hours as needed cough.  - benzonatate (TESSALON) 200 MG capsule; Take 1 capsule (200 mg total) by mouth 3 (three) times daily as needed for cough.  Dispense: 30 capsule; Refill: 0  Next appt: 12/09/2017, sooner if needed  Janett Billow K. Hickman, [facility] [phone]

## 2017-08-21 ENCOUNTER — Other Ambulatory Visit: Payer: Self-pay | Admitting: Internal Medicine

## 2017-08-21 DIAGNOSIS — F5101 Primary insomnia: Secondary | ICD-10-CM

## 2017-08-21 NOTE — Telephone Encounter (Signed)
A medication refill was received from pharmacy for zolpidem and alprazolam. Rx was called in to pharmacy after verifying last fill date, provider, and quantity on [place] database.

## 2017-09-18 ENCOUNTER — Other Ambulatory Visit: Payer: Self-pay | Admitting: Internal Medicine

## 2017-09-23 ENCOUNTER — Other Ambulatory Visit: Payer: Self-pay | Admitting: Internal Medicine

## 2017-09-23 DIAGNOSIS — F3342 Major depressive disorder, recurrent, in full remission: Secondary | ICD-10-CM

## 2017-09-23 DIAGNOSIS — F5101 Primary insomnia: Secondary | ICD-10-CM

## 2017-09-23 NOTE — Telephone Encounter (Signed)
A medication refill was received from pharmacy for zolpidem 10 mg and sertaline 50mg .  Rx was called in to pharmacy after verifying last fill date, provider, and quantity on PMP [place].

## 2017-09-30 ENCOUNTER — Other Ambulatory Visit: Payer: Self-pay | Admitting: Cardiovascular Disease

## 2017-09-30 DIAGNOSIS — I10 Essential (primary) hypertension: Secondary | ICD-10-CM

## 2017-10-21 ENCOUNTER — Other Ambulatory Visit: Payer: Self-pay | Admitting: Internal Medicine

## 2017-10-21 DIAGNOSIS — F5101 Primary insomnia: Secondary | ICD-10-CM

## 2017-10-21 NOTE — Telephone Encounter (Signed)
A medication refill was received from pharmacy for zolpidem 10 mg and alprazolam 0.5 mg. Rx was called in to pharmacy after verifying last fill date, provider, and quantity on PMP [place].

## 2017-10-28 ENCOUNTER — Other Ambulatory Visit: Payer: Self-pay | Admitting: Cardiovascular Disease

## 2017-11-10 DIAGNOSIS — M25511 Pain in right shoulder: Secondary | ICD-10-CM | POA: Diagnosis not present

## 2017-11-18 ENCOUNTER — Other Ambulatory Visit: Payer: Self-pay | Admitting: *Deleted

## 2017-11-18 ENCOUNTER — Other Ambulatory Visit: Payer: Self-pay | Admitting: Internal Medicine

## 2017-11-18 DIAGNOSIS — F5101 Primary insomnia: Secondary | ICD-10-CM

## 2017-11-18 DIAGNOSIS — G301 Alzheimer's disease with late onset: Secondary | ICD-10-CM

## 2017-11-18 DIAGNOSIS — F0280 Dementia in other diseases classified elsewhere without behavioral disturbance: Secondary | ICD-10-CM

## 2017-11-18 MED ORDER — ZOLPIDEM TARTRATE 10 MG PO TABS: 10.0000 mg | ORAL_TABLET | Freq: Every day | ORAL | 0 refills | Status: DC

## 2017-11-18 MED ORDER — MEMANTINE HCL 10 MG PO TABS: 10.0000 mg | ORAL_TABLET | Freq: Two times a day (BID) | ORAL | 3 refills | Status: DC

## 2017-11-18 MED ORDER — ALPRAZOLAM 0.5 MG PO TABS: 0.5000 mg | ORAL_TABLET | Freq: Two times a day (BID) | ORAL | 0 refills | Status: DC | PRN

## 2017-11-18 NOTE — Telephone Encounter (Signed)
Patient wife requested refills. Phoned to pharmacy.

## 2017-11-27 DIAGNOSIS — M25511 Pain in right shoulder: Secondary | ICD-10-CM | POA: Diagnosis not present

## 2017-12-03 DIAGNOSIS — M7551 Bursitis of right shoulder: Secondary | ICD-10-CM | POA: Diagnosis not present

## 2017-12-03 DIAGNOSIS — M7541 Impingement syndrome of right shoulder: Secondary | ICD-10-CM | POA: Diagnosis not present

## 2017-12-09 ENCOUNTER — Ambulatory Visit (INDEPENDENT_AMBULATORY_CARE_PROVIDER_SITE_OTHER): Payer: Medicare Other | Admitting: Internal Medicine

## 2017-12-09 ENCOUNTER — Encounter: Payer: Self-pay | Admitting: Internal Medicine

## 2017-12-09 VITALS — BP 126/74 | HR 72 | Temp 97.9°F | Ht 70.0 in | Wt 177.4 lb

## 2017-12-09 DIAGNOSIS — G301 Alzheimer's disease with late onset: Secondary | ICD-10-CM

## 2017-12-09 DIAGNOSIS — R140 Abdominal distension (gaseous): Secondary | ICD-10-CM | POA: Diagnosis not present

## 2017-12-09 DIAGNOSIS — E039 Hypothyroidism, unspecified: Secondary | ICD-10-CM

## 2017-12-09 DIAGNOSIS — W19XXXA Unspecified fall, initial encounter: Secondary | ICD-10-CM

## 2017-12-09 DIAGNOSIS — Z23 Encounter for immunization: Secondary | ICD-10-CM

## 2017-12-09 DIAGNOSIS — T07XXXA Unspecified multiple injuries, initial encounter: Secondary | ICD-10-CM

## 2017-12-09 DIAGNOSIS — I10 Essential (primary) hypertension: Secondary | ICD-10-CM

## 2017-12-09 DIAGNOSIS — K529 Noninfective gastroenteritis and colitis, unspecified: Secondary | ICD-10-CM

## 2017-12-09 DIAGNOSIS — E782 Mixed hyperlipidemia: Secondary | ICD-10-CM

## 2017-12-09 DIAGNOSIS — F0280 Dementia in other diseases classified elsewhere without behavioral disturbance: Secondary | ICD-10-CM

## 2017-12-09 MED ORDER — AMOXICILLIN-POT CLAVULANATE 875-125 MG PO TABS: 1.0000 | ORAL_TABLET | Freq: Two times a day (BID) | ORAL | 0 refills | Status: DC

## 2017-12-09 MED ORDER — PANTOPRAZOLE SODIUM 40 MG PO TBEC: 40.0000 mg | DELAYED_RELEASE_TABLET | Freq: Every day | ORAL | 3 refills | Status: DC

## 2017-12-09 NOTE — Progress Notes (Signed)
Patient ID: Phillip Larson, male   DOB: [birth year], 83 y.o.   MRN: [id number]   Location:  [facility] OFFICE  Provider: DR Arletha Grippe  Code Status:  Goals of Care:  Advanced Directives 12/09/2017  Does Patient Have a Medical Advance Directive? Yes  Type of Paramedic of [place];Living will  Does patient want to make changes to medical advance directive? -  Copy of [place] in Chart? No - copy requested  Pre-existing out of facility DNR order (yellow form or pink MOST form) -     Chief Complaint  Patient presents with  . Medical Management of Chronic Issues    Pt is being seen for a 6 month routine visit. Pt fell on gravel tennis court 6 days ago. He has abrasion to left knee and elbow.   . Depression    Score of 12  . Audit C screeing    score of 4    HPI: Patient is a 83 y.o. male seen today for medical management of chronic diseases.  He fell on tennis court last week while playing tennis with a retired Engineer, drilling. He was treated on the scene by the retired Tax adviser. He did not go to the ER. He has been treating it at home with wet paper towel, applying topical neosporin and wrapping it at night. Left elbow swelling and left knee has not gotten worse but is no better. No f/c. Last Tdap unknown. He is a poor historian due to dementia. Hx obtained from chart.  He is c/a abdominal bloating with associated diarrhea x 6-8 weeks. No recent travel hx. He has hx PUD and H pylori infection that was tx with Prevpak. He has not seen GI in several yrs. He had issues with shoulder and was taking aleve about 4 weeks ago. He reports episode of hematemesis 2 weeks after starting aleve. Stopped aleve at that time. He went to Ortho and had steroid injection which helped.  HTN/angina - stable on cardizem CD, imdur and prn SL NTG. He takes ASA daily for anticoagulation. Followed by cardio Dr Johnsie Cancel  Hypothyroidism - stable on levothyroxine. TSH 2.8  Insomnia -  stable on ambien and trazodone  Anxiety/recurrent MDD -  Mood stable on sertraline and trazodone  BPH with lower urinary tract sx's - stable on myrbetriq and flomax; followed by urology  Hyperlipidemia - stable on lovastatin. LDL 43; TG 213  Alzheimer's dementia - stable on razadyne ER and namenda. MMSE 27/30  Allergic rhinitis - stable on cetirizine  CKD - stage  3. Cr 1.34    Past Medical History:  Diagnosis Date  . Actinic keratosis   . Alzheimer's disease   . Anal fissure   . Anxiety state, unspecified   . Benign neoplasm of colon   . BENIGN PROSTATIC HYPERTROPHY, HX OF   . BPH (benign prostatic hypertrophy) with urinary obstruction 06/22/2008   Qualifier: Diagnosis of  By: Johnsie Cancel, MD, Rona Ravens   . Cerebrovascular disease, unspecified   . Coronary atherosclerosis of native coronary artery   . Depressive disorder, not elsewhere classified   . Diverticulosis of colon (without mention of hemorrhage)   . Essential hypertension, benign   . External hemorrhoids without mention of complication   . Insomnia, unspecified   . Mixed hyperlipidemia   . Osteoarthrosis, unspecified whether generalized or localized, unspecified site   . Other premature beats   . Other psoriasis   . Pain in joint, lower leg   .  Pain in joint, site unspecified   . Palpitations   . Pressure ulcer   . Reflux esophagitis   . Unspecified essential hypertension   . Unspecified hereditary and idiopathic peripheral neuropathy   . Unspecified hypothyroidism   . Unspecified vitamin D deficiency     Past Surgical History:  Procedure Laterality Date  . CERVICAL DISC SURGERY    . EYE SURGERY Left 02/07/2014   Cataract Surgery Left Eye  . KNEE SURGERY  07/18/2006   Dr French Ana   . LAMINECTOMY C3 disc  1972  . LEFT HEART CATHETERIZATION WITH CORONARY ANGIOGRAM N/A 01/28/2013   Procedure: LEFT HEART CATHETERIZATION WITH CORONARY ANGIOGRAM;  Surgeon: Josue Hector, MD;  Location: [facility] CATH LAB;   Service: Cardiovascular;  Laterality: N/A;  . MULTIPLE TOOTH EXTRACTIONS  2014  . RETINAL TEAR REPAIR CRYOTHERAPY     Dr Wayne Sever      reports that he quit smoking about 25 years ago. His smoking use included cigarettes. He has a 50.00 pack-year smoking history. He has never used smokeless tobacco. He reports that he drinks alcohol. He reports that he does not use drugs. Social History   Socioeconomic History  . Marital status: Married    Spouse name: Not on file  . Number of children: Not on file  . Years of education: Not on file  . Highest education level: Not on file  Occupational History  . Occupation: retired  Scientific laboratory technician  . Financial resource strain: Not hard at all  . Food insecurity:    Worry: Never true    Inability: Never true  . Transportation needs:    Medical: No    Non-medical: No  Tobacco Use  . Smoking status: Former Smoker    Packs/day: 2.00    Years: 25.00    Pack years: 50.00    Types: Cigarettes    Last attempt to quit: 04/22/1992    Years since quitting: 25.6  . Smokeless tobacco: Never Used  Substance and Sexual Activity  . Alcohol use: Yes    Alcohol/week: 0.0 standard drinks    Comment: beer/wine/liquor daily  . Drug use: No  . Sexual activity: Not Currently  Lifestyle  . Physical activity:    Days per week: 2 days    Minutes per session: 90 min  . Stress: Not at all  Relationships  . Social connections:    Talks on phone: Once a week    Gets together: Three times a week    Attends religious service: More than 4 times per year    Active member of club or organization: No    Attends meetings of clubs or organizations: Never    Relationship status: Married  . Intimate partner violence:    Fear of current or ex partner: No    Emotionally abused: No    Physically abused: No    Forced sexual activity: No  Other Topics Concern  . Not on file  Social History Narrative  . Not on file    Family History  Problem Relation Age of Onset  .  Cancer Father        pancreatic  . Cancer Mother        pancreatic    Allergies  Allergen Reactions  . Iodine Swelling    Internal iodine  . Iohexol Hives         Outpatient Encounter Medications as of 12/09/2017  Medication Sig  . ALPRAZolam (XANAX) 0.5 MG tablet Take 1 tablet (0.5 mg  total) by mouth 2 (two) times daily as needed.  [name] aspirin EC 81 MG tablet Take 81 mg by mouth at bedtime.  [name] b complex vitamins tablet Take 1 tablet by mouth daily.  [name] CARTIA XT 240 MG 24 hr capsule TAKE 1 CAPSULE BY MOUTH ONCE DAILY  . cetirizine (ZYRTEC) 10 MG tablet Take 1 tablet (10 mg total) by mouth daily.  . cholecalciferol (VITAMIN D) 1000 UNITS tablet Take 1,000 Units by mouth daily.  [name] galantamine (RAZADYNE ER) 24 MG 24 hr capsule TAKE ONE CAPSULE EVERY DAY WITH BREAKFAST  . isosorbide mononitrate (IMDUR) 30 MG 24 hr tablet TAKE 1 TABLET BY MOUTH ONCE DAILY  . levothyroxine (SYNTHROID, LEVOTHROID) 88 MCG tablet TAKE 1 TABLET BY MOUTH ONCE DAILY BEFORE BREAKFAST  . lovastatin (MEVACOR) 40 MG tablet Take one tablet by mouth once daily at bedtime as needed for cholesterol  . MAGNESIUM PO Take by mouth daily.  . memantine (NAMENDA) 10 MG tablet Take 1 tablet (10 mg total) by mouth 2 (two) times daily.  . mirabegron ER (MYRBETRIQ) 50 MG TB24 tablet Take 50 mg by mouth every other day.  . Multiple Vitamin (MULTIVITAMIN WITH MINERALS) TABS tablet Take 1 tablet by mouth daily.  . nitroGLYCERIN (NITROSTAT) 0.4 MG SL tablet One under the tongue if needed for chest tightness. Repeat in 5 minutes if needed. Repeat again in 5 minutes if needed.  . sertraline (ZOLOFT) 50 MG tablet TAKE 1 TABLET BY MOUTH ONCE DAILY  . tamsulosin (FLOMAX) 0.4 MG CAPS capsule TAKE ONE CAPSULE BY MOUTH ONCE DAILY TO HELP RELIEVE OBSTRUCTION  . traZODone (DESYREL) 100 MG tablet TAKE 1 TABLET BY MOUTH AT BEDTIME TO  HELP  REST  . zinc gluconate 50 MG tablet Take 50 mg by mouth at bedtime.  [name] zolpidem (AMBIEN) 10 MG tablet Take  1 tablet (10 mg total) by mouth at bedtime.  . [DISCONTINUED] benzonatate (TESSALON) 200 MG capsule Take 1 capsule (200 mg total) by mouth 3 (three) times daily as needed for cough.   No facility-administered encounter medications on file as of 12/09/2017.     Review of Systems:  Review of Systems  Unable to perform ROS: Dementia    Health Maintenance  Topic Date Due  . PNA vac Low Risk Adult (2 of 2 - PPSV23) 05/21/2017  . INFLUENZA VACCINE  11/20/2017  . TETANUS/TDAP  06/17/2021    Physical Exam: Vitals:   12/09/17 1337  BP: 126/74  Pulse: 72  Temp: 97.9 F (36.6 C)  TempSrc: Oral  SpO2: 96%  Weight: 177 lb 6.4 oz (80.5 kg)  Height: 5\' 10"  (1.778 m)   Body mass index is 25.45 kg/m. Physical Exam  Constitutional: He appears well-developed and well-nourished.  HENT:  Mouth/Throat: Oropharynx is clear and moist.  MMM; no oral thrush  Eyes: Pupils are equal, round, and reactive to light. No scleral icterus.  Neck: Neck supple. Carotid bruit is not present. No thyromegaly present.  Cardiovascular: Normal rate, regular rhythm and intact distal pulses. Exam reveals no gallop and no friction rub.  Murmur (1/6 SEM) heard. no distal LE swelling. No calf TTP  Pulmonary/Chest: Effort normal and breath sounds normal. He has no wheezes. He has no rales. He exhibits no tenderness.  Abdominal: Soft. Bowel sounds are normal. He exhibits distension. He exhibits no abdominal bruit, no pulsatile midline mass and no mass. There is no hepatomegaly. There is no tenderness. There is no rebound and no guarding. A hernia (ventral, reducible) is  present.  obese  Musculoskeletal: He exhibits edema and tenderness.  Lymphadenopathy:    He has no cervical adenopathy.  Neurological: He is alert. He has normal reflexes.  Skin: Skin is warm and dry. No rash noted.     Psychiatric: He has a normal mood and affect. His behavior is normal. Thought content normal.    Labs reviewed: Basic Metabolic  Panel: Recent Labs    06/06/17 1051 07/22/17 1348  NA 139  --   K 4.2  --   CL 103  --   CO2 28  --   GLUCOSE 91  --   BUN 12  --   CREATININE 1.31*  --   CALCIUM 9.6  --   TSH 4.63* 2.80   Liver Function Tests: Recent Labs    06/06/17 1051  AST 23  ALT 23  BILITOT 0.7  PROT 6.1   No results for input(s): LIPASE, AMYLASE in the last 8760 hours. No results for input(s): AMMONIA in the last 8760 hours. CBC: Recent Labs    06/06/17 1051  WBC 5.4  NEUTROABS 3,235  HGB 14.7  HCT 42.6  MCV 93.8  PLT 216   Lipid Panel: Recent Labs    06/06/17 1051  CHOL 165  HDL 70  LDLCALC 73  TRIG 140  CHOLHDL 2.4   No results found for: HGBA1C  Procedures since last visit: No results found.  Assessment/Plan   ICD-10-CM   1. Colitis K52.9 amoxicillin-clavulanate (AUGMENTIN) 875-125 MG tablet  2. Multiple abrasions T07.XXXA   3. Abdominal bloating with hx H pylori infection R14.0 pantoprazole (PROTONIX) 40 MG tablet  4. Hypothyroidism, unspecified type E03.9   5. Essential hypertension, benign I10   6. Mixed hyperlipidemia E78.2   7. Late onset Alzheimer's disease without behavioral disturbance G30.1    F02.80   8. Fall, initial encounter W19.XXXA       Continue local care to abrasions - clean with warm soapy water (Dial antibacterial soap) and apply topical antibiotic like neosporin;cover during the day and leave open at night  TETANUS VACCINE GIVEN TODAY  START PROTONIX 40MG  DAILY X 8 WEEKS  START AUGMENTIN 875 TAKE 1 TAB 2 TIMES DAILY WITH FOOD FOR COLITIS x 2 weeks  Continue other medications as ordered  T/c abdominal xry vs CT if no better in next 48-72 hrs. May need to see GI if no better  Follow up in 6 mos with Janett Billow for CPE. Fasting labs prior to appt  [place] S. Perlie Gold  [facility] [address] [place], [place] [address] [phone] Cell (Monday-Friday 8 AM - 5 PM) [phone] After 5 PM  and follow prompts

## 2017-12-09 NOTE — Patient Instructions (Addendum)
Continue local care to abrasions - clean with warm soapy water (Dial antibacterial soap) and apply topical antibiotic like neosporin;cover during the day and leave open at night  TETANUS VACCINE GIVEN TODAY  START PROTONIX 40MG  DAILY X 8 WEEKS  START AUGMENTIN 875 TAKE 1 TAB 2 TIMES DAILY WITH FOOD FOR COLITIS x 2 weeks  Continue other medications as ordered  Follow up in 6 mos with Janett Billow for CPE. Fasting labs prior to appt   Wound Care, Adult Taking care of your wound properly can help to prevent pain and infection. It can also help your wound to heal more quickly. How is this treated? Wound care  Follow instructions from your health care provider about how to take care of your wound. Make sure you: ? Wash your hands with soap and water before you change the bandage (dressing). If soap and water are not available, use hand sanitizer. ? Change your dressing as told by your health care provider. ? Leave stitches (sutures), skin glue, or adhesive strips in place. These skin closures may need to stay in place for 2 weeks or longer. If adhesive strip edges start to loosen and curl up, you may trim the loose edges. Do not remove adhesive strips completely unless your health care provider tells you to do that.  Check your wound area every day for signs of infection. Check for: ? More redness, swelling, or pain. ? More fluid or blood. ? Warmth. ? Pus or a bad smell.  Ask your health care provider if you should clean the wound with mild soap and water. Doing this may include: ? Using a clean towel to pat the wound dry after cleaning it. Do not rub or scrub the wound. ? Applying a cream or ointment. Do this only as told by your health care provider. ? Covering the incision with a clean dressing.  Ask your health care provider when you can leave the wound uncovered. Medicines   If you were prescribed an antibiotic medicine, cream, or ointment, take or use the antibiotic as told by your  health care provider. Do not stop taking or using the antibiotic even if your condition improves.  Take over-the-counter and prescription medicines only as told by your health care provider. If you were prescribed pain medicine, take it at least 30 minutes before doing any wound care or as told by your health care provider. General instructions  Return to your normal activities as told by your health care provider. Ask your health care provider what activities are safe.  Do not scratch or pick at the wound.  Keep all follow-up visits as told by your health care provider. This is important.  Eat a diet that includes protein, vitamin A, vitamin C, and other nutrient-rich foods. These help the wound heal: ? Protein-rich foods include meat, dairy, beans, nuts, and other sources. ? Vitamin A-rich foods include carrots and dark green, leafy vegetables. ? Vitamin C-rich foods include citrus, tomatoes, and other fruits and vegetables. ? Nutrient-rich foods have protein, carbohydrates, fat, vitamins, or minerals. Eat a variety of healthy foods including vegetables, fruits, and whole grains. Contact a health care provider if:  You received a tetanus shot and you have swelling, severe pain, redness, or bleeding at the injection site.  Your pain is not controlled with medicine.  You have more redness, swelling, or pain around the wound.  You have more fluid or blood coming from the wound.  Your wound feels warm to the touch.  You have pus or a bad smell coming from the wound.  You have a fever or chills.  You are nauseous or you vomit.  You are dizzy. Get help right away if:  You have a red streak going away from your wound.  The edges of the wound open up and separate.  Your wound is bleeding and the bleeding does not stop with gentle pressure.  You have a rash.  You faint.  You have trouble breathing. This information is not intended to replace advice given to you by your health  care provider. Make sure you discuss any questions you have with your health care provider. Document Released: 01/16/2008 Document Revised: 12/06/2015 Document Reviewed: 10/24/2015 Elsevier Interactive Patient Education  2017 [employer].

## 2017-12-10 ENCOUNTER — Encounter: Payer: Self-pay | Admitting: Internal Medicine

## 2017-12-18 ENCOUNTER — Other Ambulatory Visit: Payer: Self-pay | Admitting: Internal Medicine

## 2017-12-18 DIAGNOSIS — F5101 Primary insomnia: Secondary | ICD-10-CM

## 2017-12-18 DIAGNOSIS — F5104 Psychophysiologic insomnia: Secondary | ICD-10-CM

## 2017-12-18 NOTE — Telephone Encounter (Signed)
A medication refill was received from pharmacy for zolpidem 10 mg, alprazolam 0.5 mg, trazodone 100mg , and levothyroxine 88 mcg. Rx for trazodone and levothyroxine was sent to the pharmacy electronically. A refill was phoned in for zolpidem and alprazolam.

## 2017-12-30 ENCOUNTER — Other Ambulatory Visit: Payer: Self-pay

## 2017-12-30 DIAGNOSIS — I10 Essential (primary) hypertension: Secondary | ICD-10-CM

## 2017-12-30 DIAGNOSIS — E782 Mixed hyperlipidemia: Secondary | ICD-10-CM

## 2017-12-30 DIAGNOSIS — E039 Hypothyroidism, unspecified: Secondary | ICD-10-CM

## 2018-01-22 ENCOUNTER — Other Ambulatory Visit: Payer: Self-pay | Admitting: *Deleted

## 2018-01-22 DIAGNOSIS — F5101 Primary insomnia: Secondary | ICD-10-CM

## 2018-01-22 MED ORDER — ZOLPIDEM TARTRATE 10 MG PO TABS: 10.0000 mg | ORAL_TABLET | Freq: Every day | ORAL | 0 refills | Status: DC

## 2018-01-22 NOTE — Telephone Encounter (Signed)
Patient wife, Vickii Chafe requested. Phoned to Pharmacy.

## 2018-01-27 ENCOUNTER — Other Ambulatory Visit: Payer: Self-pay | Admitting: Internal Medicine

## 2018-01-27 NOTE — Telephone Encounter (Signed)
Very High Risk warning populated when attempting to refill  Forwarded to Gildardo Cranker, DO

## 2018-02-25 ENCOUNTER — Other Ambulatory Visit: Payer: Self-pay | Admitting: *Deleted

## 2018-02-25 DIAGNOSIS — F5101 Primary insomnia: Secondary | ICD-10-CM

## 2018-02-25 MED ORDER — ZOLPIDEM TARTRATE 10 MG PO TABS: 10.0000 mg | ORAL_TABLET | Freq: Every day | ORAL | 0 refills | Status: DC

## 2018-02-25 NOTE — Telephone Encounter (Signed)
[facility] 

## 2018-02-26 ENCOUNTER — Other Ambulatory Visit: Payer: Self-pay | Admitting: *Deleted

## 2018-02-26 MED ORDER — ALPRAZOLAM 0.5 MG PO TABS: 0.5000 mg | ORAL_TABLET | Freq: Two times a day (BID) | ORAL | 0 refills | Status: DC | PRN

## 2018-02-26 NOTE — Telephone Encounter (Signed)
Patient wife requested. Phoned to pharmacy.

## 2018-03-24 ENCOUNTER — Other Ambulatory Visit: Payer: Self-pay | Admitting: *Deleted

## 2018-03-24 DIAGNOSIS — F5101 Primary insomnia: Secondary | ICD-10-CM

## 2018-03-24 MED ORDER — ZOLPIDEM TARTRATE 10 MG PO TABS: 10.0000 mg | ORAL_TABLET | Freq: Every day | ORAL | 0 refills | Status: DC

## 2018-03-24 NOTE — Telephone Encounter (Signed)
[facility] 

## 2018-03-24 NOTE — Progress Notes (Signed)
Patient ID: Phillip Larson, male   DOB: February 10, 1935, 83 y.o.   MRN: [id number]   Phillip Larson is seen today for followup HTN, HLD  and CAD .[name] He has been more active. He is watching the salt in his diet.  He has CAD with a total RCA that is collaterized.     Cath done 01/28/13 from right radial approach and no changes with chronically occluded RCA , robust collaterals and no significant left sided disease Echo reviewed from Sep 13 2016 EF 45-50% mild AR And trivial MR  Subacromial bursitis left shoulder sees [facility]  Alzheimers's started on aricept and namenda  His wife recently diagnosed with ? Stomach cancer going through w/u now   ROS: Denies fever, malais, weight loss, blurry vision, decreased visual acuity, cough, sputum, SOB, hemoptysis, pleuritic pain, palpitaitons, heartburn, abdominal pain, melena, lower extremity edema, claudication, or rash.  All other systems reviewed and negative  BP 128/76   Pulse 66   Ht 5\' 10"  (1.778 m)   Wt 184 lb (83.5 kg)   BMI 26.40 kg/m  Affect appropriate Healthy:  appears stated age 83: normal Neck supple with no adenopathy JVP normal no bruits no thyromegaly Lungs clear with no wheezing and good diaphragmatic motion Heart:  S1/S2 SEM  murmur, no rub, gallop or click PMI normal Abdomen: benighn, BS positve, no tenderness, no AAA no bruit.  No HSM or HJR Distal pulses intact with no bruits No edema Neuro non-focal Skin warm and dry No muscular weakness      Current Outpatient Medications  Medication Sig Dispense Refill  . ALPRAZolam (XANAX) 0.5 MG tablet Take 1 tablet (0.5 mg total) by mouth 2 (two) times daily as needed. 60 tablet 0  . amoxicillin-clavulanate (AUGMENTIN) 875-125 MG tablet Take 1 tablet by mouth 2 (two) times daily. 28 tablet 0  . aspirin EC 81 MG tablet Take 81 mg by mouth at bedtime.    [name] b complex vitamins tablet Take 1 tablet by mouth daily.    [name] CARTIA XT 240 MG 24 hr capsule TAKE 1 CAPSULE BY MOUTH ONCE DAILY 90  capsule 1  . cetirizine (ZYRTEC) 10 MG tablet Take 1 tablet (10 mg total) by mouth daily. 30 tablet 11  . cholecalciferol (VITAMIN D) 1000 UNITS tablet Take 1,000 Units by mouth daily.    [name] galantamine (RAZADYNE ER) 24 MG 24 hr capsule TAKE ONE CAPSULE EVERY DAY WITH BREAKFAST 30 capsule 5  . isosorbide mononitrate (IMDUR) 30 MG 24 hr tablet TAKE 1 TABLET BY MOUTH ONCE DAILY 90 tablet 1  . levothyroxine (SYNTHROID, LEVOTHROID) 88 MCG tablet TAKE 1 TABLET BY MOUTH ONCE DAILY BEFORE BREAKFAST 90 tablet 1  . lovastatin (MEVACOR) 40 MG tablet TAKE 1 TABLET BY MOUTH ONCE DAILY AT BEDTIME AS NEEDED FOR CHOLESTEROL 90 tablet 2  . MAGNESIUM PO Take by mouth daily.    . memantine (NAMENDA) 10 MG tablet Take 1 tablet (10 mg total) by mouth 2 (two) times daily. 60 tablet 3  . metroNIDAZOLE (METROCREAM) 0.75 % cream Apply 3.08 application topically 2 (two) times daily.  2  . mirabegron ER (MYRBETRIQ) 50 MG TB24 tablet Take 50 mg by mouth every other day.    . Multiple Vitamin (MULTIVITAMIN WITH MINERALS) TABS tablet Take 1 tablet by mouth daily.    . nitroGLYCERIN (NITROSTAT) 0.4 MG SL tablet One under the tongue if needed for chest tightness. Repeat in 5 minutes if needed. Repeat again in 5 minutes if needed. 25 tablet  12  . pantoprazole (PROTONIX) 40 MG tablet Take 1 tablet (40 mg total) by mouth daily. 30 tablet 3  . sertraline (ZOLOFT) 50 MG tablet TAKE 1 TABLET BY MOUTH ONCE DAILY 90 tablet 1  . tamsulosin (FLOMAX) 0.4 MG CAPS capsule TAKE ONE CAPSULE BY MOUTH ONCE DAILY TO HELP RELIEVE OBSTRUCTION 90 capsule 1  . traZODone (DESYREL) 100 MG tablet TAKE 1 TABLET BY MOUTH EVERY DAY AT BEDTIME TO HELP REST 90 tablet 1  . zinc gluconate 50 MG tablet Take 50 mg by mouth at bedtime.    [name] zolpidem (AMBIEN) 10 MG tablet Take 1 tablet (10 mg total) by mouth at bedtime. 30 tablet 0   No current facility-administered medications for this visit.     Allergies  Iodine and Iohexol  Electrocardiogram:    03/08/14  SR rate 63  Old IMI  No change from 2014  09/14/15  SR rate 80 old IMI poor R Wave progression  09/02/16 SB rate 73 possible old IMI PVC   Assessment and Plan CAD:  Chronically occluded RCA , robust collaterals and no significant left sided disease Stable with no angina and good activity level.  Continue medical Rx EF 45-50% by echo 09/13/16   Chol:  On statin Cholesterol is at goal.  Continue current dose of statin and diet Rx.  No myalgias or side effects.  F/U  LFT's in 6 months. Lab Results  Component Value Date   [place] 73 06/06/2017   labs with primary   Dementia:  Started on namenda and aricept f/u Dr Nyoka Cowden  HTN:  Well controlled.  Continue current medications and low sodium Dash type diet.    Prostate:  On proscar  Lab Results  Component Value Date   PSA 0.7 11/27/2015     Thyroid:  On replacement  Lab Results  Component Value Date   TSH 2.80 07/22/2017    Bursitis:  Left shoulder f/u Caffry   Fatigue/Murmur: Echo 09/13/16 trivial MR AV sclerosis and mild AR         Jenkins Rouge, MD

## 2018-03-25 ENCOUNTER — Ambulatory Visit: Payer: Medicare Other | Admitting: Cardiovascular Disease

## 2018-03-25 VITALS — BP 128/76 | HR 66 | Ht 70.0 in | Wt 184.0 lb

## 2018-03-25 DIAGNOSIS — I10 Essential (primary) hypertension: Secondary | ICD-10-CM

## 2018-03-25 DIAGNOSIS — I2583 Coronary atherosclerosis due to lipid rich plaque: Secondary | ICD-10-CM

## 2018-03-25 DIAGNOSIS — I2510 Atherosclerotic heart disease of native coronary artery without angina pectoris: Secondary | ICD-10-CM | POA: Diagnosis not present

## 2018-03-25 NOTE — Patient Instructions (Signed)

## 2018-03-31 ENCOUNTER — Other Ambulatory Visit: Payer: Self-pay | Admitting: *Deleted

## 2018-03-31 DIAGNOSIS — F3342 Major depressive disorder, recurrent, in full remission: Secondary | ICD-10-CM

## 2018-03-31 MED ORDER — SERTRALINE HCL 50 MG PO TABS: 50.0000 mg | ORAL_TABLET | Freq: Every day | ORAL | 1 refills | Status: DC

## 2018-03-31 NOTE — Telephone Encounter (Signed)
[facility] 

## 2018-04-07 ENCOUNTER — Other Ambulatory Visit: Payer: Self-pay | Admitting: Cardiovascular Disease

## 2018-04-07 DIAGNOSIS — I10 Essential (primary) hypertension: Secondary | ICD-10-CM

## 2018-04-23 ENCOUNTER — Other Ambulatory Visit: Payer: Self-pay | Admitting: Nurse Practitioner

## 2018-04-23 DIAGNOSIS — F5101 Primary insomnia: Secondary | ICD-10-CM

## 2018-04-23 NOTE — Telephone Encounter (Signed)
Checked [place] data base and verified last refill was 03/24/18 for 30 tablets of zolpidem.

## 2018-04-28 ENCOUNTER — Other Ambulatory Visit: Payer: Self-pay | Admitting: Nurse Practitioner

## 2018-04-28 NOTE — Telephone Encounter (Signed)
Patient has been verified in the [place] Data base last refill was 02/26/2018 for alprazolam 0.5 mg tab 1 po bid prn a total of 60 pills dispensed. Patient has an appointment with Janett Billow on 06/11/2018. Prescription has been called to patient's pharmacy.

## 2018-05-06 ENCOUNTER — Other Ambulatory Visit: Payer: Self-pay | Admitting: *Deleted

## 2018-05-06 MED ORDER — GALANTAMINE HYDROBROMIDE ER 24 MG PO CP24: ORAL_CAPSULE | ORAL | 1 refills | Status: DC

## 2018-05-06 NOTE — Telephone Encounter (Signed)
Patient wife requested refill.

## 2018-05-26 ENCOUNTER — Other Ambulatory Visit: Payer: Self-pay | Admitting: Nurse Practitioner

## 2018-05-26 DIAGNOSIS — F5101 Primary insomnia: Secondary | ICD-10-CM

## 2018-05-26 NOTE — Telephone Encounter (Signed)
Patient requesting refill for zolpidem. Check [place] data base last refill given on 04/28/2018 for 60 tabs. Last appointment was 12/09/2017 with Dr. Eulas Post. Next appointment is with Sherrie Mustache on 06/11/2018.

## 2018-05-26 NOTE — Telephone Encounter (Signed)
After rechecking the database 04/23/2018 for a 30 day supply of Ambien

## 2018-06-04 ENCOUNTER — Other Ambulatory Visit: Payer: Self-pay | Admitting: Nurse Practitioner

## 2018-06-04 NOTE — Telephone Encounter (Signed)
Last OV  12/09/2017 Last refill 05/15/2018  Next appointment scheduled for 06/11/2018 Database verified

## 2018-06-08 ENCOUNTER — Other Ambulatory Visit: Payer: Medicare Other

## 2018-06-08 ENCOUNTER — Ambulatory Visit: Payer: Self-pay

## 2018-06-09 ENCOUNTER — Other Ambulatory Visit: Payer: Medicare Other

## 2018-06-09 DIAGNOSIS — E782 Mixed hyperlipidemia: Secondary | ICD-10-CM | POA: Diagnosis not present

## 2018-06-09 DIAGNOSIS — E039 Hypothyroidism, unspecified: Secondary | ICD-10-CM

## 2018-06-09 DIAGNOSIS — I10 Essential (primary) hypertension: Secondary | ICD-10-CM | POA: Diagnosis not present

## 2018-06-09 LAB — CBC WITH DIFFERENTIAL/PLATELET
Absolute Monocytes: 370 cells/uL (ref 200–950)
Basophils Absolute: 20 cells/uL (ref 0–200)
Basophils Relative: 0.4 %
Eosinophils Absolute: 435 cells/uL (ref 15–500)
Eosinophils Relative: 8.7 %
HCT: 43.4 % (ref 38.5–50.0)
Hemoglobin: 14.8 g/dL (ref 13.2–17.1)
Lymphs Abs: 1400 cells/uL (ref 850–3900)
MCH: 33 pg (ref 27.0–33.0)
MCHC: 34.1 g/dL (ref 32.0–36.0)
MCV: 96.7 fL (ref 80.0–100.0)
MPV: 11.3 fL (ref 7.5–12.5)
Monocytes Relative: 7.4 %
NEUTROS PCT: 55.5 %
Neutro Abs: 2775 cells/uL (ref 1500–7800)
Platelets: 222 10*3/uL (ref 140–400)
RBC: 4.49 10*6/uL (ref 4.20–5.80)
RDW: 12.4 % (ref 11.0–15.0)
Total Lymphocyte: 28 %
WBC: 5 10*3/uL (ref 3.8–10.8)

## 2018-06-09 LAB — LIPID PANEL
Cholesterol: 172 mg/dL (ref <200)
HDL: 57 mg/dL (ref >=40)
LDL Cholesterol (Calc): 84 mg/dL (calc)
NON-HDL CHOLESTEROL (CALC): 115 mg/dL (ref <130)
Total CHOL/HDL Ratio: 3 (calc) (ref <5.0)
Triglycerides: 222 mg/dL — ABNORMAL HIGH (ref <150)

## 2018-06-09 LAB — COMPLETE METABOLIC PANEL WITH GFR
AG Ratio: 2.1 (calc) (ref 1.0–2.5)
ALT: 25 U/L (ref 9–46)
AST: 25 U/L (ref 10–35)
Albumin: 4.1 g/dL (ref 3.6–5.1)
Alkaline phosphatase (APISO): 99 U/L (ref 35–144)
BUN/Creatinine Ratio: 11 (calc) (ref 6–22)
BUN: 15 mg/dL (ref 7–25)
CO2: 29 mmol/L (ref 20–32)
CREATININE: 1.34 mg/dL — AB (ref 0.70–1.11)
Calcium: 9.3 mg/dL (ref 8.6–10.3)
Chloride: 107 mmol/L (ref 98–110)
GFR, Est African American: 57 mL/min/{1.73_m2} — ABNORMAL LOW (ref >=60)
GFR, Est Non African American: 49 mL/min/{1.73_m2} — ABNORMAL LOW (ref >=60)
Globulin: 2 g/dL (calc) (ref 1.9–3.7)
Glucose, Bld: 83 mg/dL (ref 65–99)
Potassium: 4.5 mmol/L (ref 3.5–5.3)
Sodium: 140 mmol/L (ref 135–146)
Total Bilirubin: 0.5 mg/dL (ref 0.2–1.2)
Total Protein: 6.1 g/dL (ref 6.1–8.1)

## 2018-06-09 LAB — TSH: TSH: 3.76 mIU/L (ref 0.40–4.50)

## 2018-06-09 LAB — T4, FREE: Free T4: 1.2 ng/dL (ref 0.8–1.8)

## 2018-06-11 ENCOUNTER — Encounter: Payer: Medicare Other | Admitting: Nurse Practitioner

## 2018-06-11 ENCOUNTER — Ambulatory Visit (INDEPENDENT_AMBULATORY_CARE_PROVIDER_SITE_OTHER): Payer: Medicare Other | Admitting: Nurse Practitioner

## 2018-06-11 ENCOUNTER — Encounter: Payer: Self-pay | Admitting: Nurse Practitioner

## 2018-06-11 VITALS — BP 124/68 | HR 65 | Temp 97.6°F | Ht 70.0 in | Wt 184.0 lb

## 2018-06-11 DIAGNOSIS — Z0000 Encounter for general adult medical examination without abnormal findings: Secondary | ICD-10-CM

## 2018-06-11 DIAGNOSIS — Z23 Encounter for immunization: Secondary | ICD-10-CM | POA: Diagnosis not present

## 2018-06-11 DIAGNOSIS — Z9109 Other allergy status, other than to drugs and biological substances: Secondary | ICD-10-CM

## 2018-06-11 DIAGNOSIS — G301 Alzheimer's disease with late onset: Secondary | ICD-10-CM | POA: Diagnosis not present

## 2018-06-11 DIAGNOSIS — E782 Mixed hyperlipidemia: Secondary | ICD-10-CM | POA: Diagnosis not present

## 2018-06-11 DIAGNOSIS — E039 Hypothyroidism, unspecified: Secondary | ICD-10-CM | POA: Diagnosis not present

## 2018-06-11 DIAGNOSIS — F411 Generalized anxiety disorder: Secondary | ICD-10-CM

## 2018-06-11 DIAGNOSIS — F5101 Primary insomnia: Secondary | ICD-10-CM

## 2018-06-11 DIAGNOSIS — F0280 Dementia in other diseases classified elsewhere without behavioral disturbance: Secondary | ICD-10-CM

## 2018-06-11 DIAGNOSIS — I10 Essential (primary) hypertension: Secondary | ICD-10-CM | POA: Diagnosis not present

## 2018-06-11 DIAGNOSIS — N3281 Overactive bladder: Secondary | ICD-10-CM

## 2018-06-11 MED ORDER — ZOLPIDEM TARTRATE 5 MG PO TABS: 5.0000 mg | ORAL_TABLET | Freq: Every day | ORAL | 0 refills | Status: DC

## 2018-06-11 MED ORDER — GALANTAMINE HYDROBROMIDE ER 24 MG PO CP24: ORAL_CAPSULE | ORAL | 1 refills | Status: DC

## 2018-06-11 MED ORDER — ZOSTER VAC RECOMB ADJUVANTED 50 MCG/0.5ML IM SUSR: 0.5000 mL | Freq: Once | INTRAMUSCULAR | 2 refills | Status: DC

## 2018-06-11 MED ORDER — MEMANTINE HCL 10 MG PO TABS: 10.0000 mg | ORAL_TABLET | Freq: Two times a day (BID) | ORAL | 3 refills | Status: DC

## 2018-06-11 MED ORDER — ZOSTER VAC RECOMB ADJUVANTED 50 MCG/0.5ML IM SUSR: 0.5000 mL | Freq: Once | INTRAMUSCULAR | 1 refills | Status: AC

## 2018-06-11 MED ORDER — MIRABEGRON ER 50 MG PO TB24: 50.0000 mg | ORAL_TABLET | ORAL | 2 refills | Status: DC

## 2018-06-11 MED ORDER — ALPRAZOLAM 0.5 MG PO TABS: 0.5000 mg | ORAL_TABLET | Freq: Two times a day (BID) | ORAL | 0 refills | Status: DC | PRN

## 2018-06-11 NOTE — Patient Instructions (Signed)
Phillip Larson , Thank you for taking time to come for your Medicare Wellness Visit. I appreciate your ongoing commitment to your health goals. Please review the following plan we discussed and let me know if I can assist you in the future.   Screening recommendations/referrals: Colonoscopy aged out.  Recommended yearly ophthalmology/optometry visit for glaucoma screening and checkup Recommended yearly dental visit for hygiene and checkup  Vaccinations: Influenza vaccine: to get today Pneumococcal vaccine: up to date Tdap vaccine: up to date Shingles vaccine will send to pharmacy  Advanced directives: update and bring copy to office.   Conditions/risks identified: falls.   Next appointment: 1 year   Preventive Care 42 Years and Older, Male Preventive care refers to lifestyle choices and visits with your health care provider that can promote health and wellness. What does preventive care include?  A yearly physical exam. This is also called an annual well check.  Dental exams once or twice a year.  Routine eye exams. Ask your health care provider how often you should have your eyes checked.  Personal lifestyle choices, including:  Daily care of your teeth and gums.  Regular physical activity.  Eating a healthy diet.  Avoiding tobacco and drug use.  Limiting alcohol use.  Practicing safe sex.  Taking low doses of aspirin every day.  Taking vitamin and mineral supplements as recommended by your health care provider. What happens during an annual well check? The services and screenings done by your health care provider during your annual well check will depend on your age, overall health, lifestyle risk factors, and family history of disease. Counseling  Your health care provider may ask you questions about your:  Alcohol use.  Tobacco use.  Drug use.  Emotional well-being.  Home and relationship well-being.  Sexual activity.  Eating habits.  History of  falls.  Memory and ability to understand (cognition).  Work and work Statistician. Screening  You may have the following tests or measurements:  Height, weight, and BMI.  Blood pressure.  Lipid and cholesterol levels. These may be checked every 5 years, or more frequently if you are over 37 years old.  Skin check.  Lung cancer screening. You may have this screening every year starting at age 68 if you have a 30-pack-year history of smoking and currently smoke or have quit within the past 15 years.  Fecal occult blood test (FOBT) of the stool. You may have this test every year starting at age 23.  Flexible sigmoidoscopy or colonoscopy. You may have a sigmoidoscopy every 5 years or a colonoscopy every 10 years starting at age 40.  Prostate cancer screening. Recommendations will vary depending on your family history and other risks.  Hepatitis C blood test.  Hepatitis B blood test.  Sexually transmitted disease (STD) testing.  Diabetes screening. This is done by checking your blood sugar (glucose) after you have not eaten for a while (fasting). You may have this done every 1-3 years.  Abdominal aortic aneurysm (AAA) screening. You may need this if you are a current or former smoker.  Osteoporosis. You may be screened starting at age 71 if you are at high risk. Talk with your health care provider about your test results, treatment options, and if necessary, the need for more tests. Vaccines  Your health care provider may recommend certain vaccines, such as:  Influenza vaccine. This is recommended every year.  Tetanus, diphtheria, and acellular pertussis (Tdap, Td) vaccine. You may need a Td booster every 10 years.  Zoster vaccine. You may need this after age 91.  Pneumococcal 13-valent conjugate (PCV13) vaccine. One dose is recommended after age 67.  Pneumococcal polysaccharide (PPSV23) vaccine. One dose is recommended after age 54. Talk to your health care provider about  which screenings and vaccines you need and how often you need them. This information is not intended to replace advice given to you by your health care provider. Make sure you discuss any questions you have with your health care provider. Document Released: 05/05/2015 Document Revised: 12/27/2015 Document Reviewed: 02/07/2015 Elsevier Interactive Patient Education  2017 [place] Prevention in the Home Falls can cause injuries. They can happen to people of all ages. There are many things you can do to make your home safe and to help prevent falls. What can I do on the outside of my home?  Regularly fix the edges of walkways and driveways and fix any cracks.  Remove anything that might make you trip as you walk through a door, such as a raised step or threshold.  Trim any bushes or trees on the path to your home.  Use bright outdoor lighting.  Clear any walking paths of anything that might make someone trip, such as rocks or tools.  Regularly check to see if handrails are loose or broken. Make sure that both sides of any steps have handrails.  Any raised decks and porches should have guardrails on the edges.  Have any leaves, snow, or ice cleared regularly.  Use sand or salt on walking paths during winter.  Clean up any spills in your garage right away. This includes oil or grease spills. What can I do in the bathroom?  Use night lights.  Install grab bars by the toilet and in the tub and shower. Do not use towel bars as grab bars.  Use non-skid mats or decals in the tub or shower.  If you need to sit down in the shower, use a plastic, non-slip stool.  Keep the floor dry. Clean up any water that spills on the floor as soon as it happens.  Remove soap buildup in the tub or shower regularly.  Attach bath mats securely with double-sided non-slip rug tape.  Do not have throw rugs and other things on the floor that can make you trip. What can I do in the  bedroom?  Use night lights.  Make sure that you have a light by your bed that is easy to reach.  Do not use any sheets or blankets that are too big for your bed. They should not hang down onto the floor.  Have a firm chair that has side arms. You can use this for support while you get dressed.  Do not have throw rugs and other things on the floor that can make you trip. What can I do in the kitchen?  Clean up any spills right away.  Avoid walking on wet floors.  Keep items that you use a lot in easy-to-reach places.  If you need to reach something above you, use a strong step stool that has a grab bar.  Keep electrical cords out of the way.  Do not use floor polish or wax that makes floors slippery. If you must use wax, use non-skid floor wax.  Do not have throw rugs and other things on the floor that can make you trip. What can I do with my stairs?  Do not leave any items on the stairs.  Make sure that there are  handrails on both sides of the stairs and use them. Fix handrails that are broken or loose. Make sure that handrails are as long as the stairways.  Check any carpeting to make sure that it is firmly attached to the stairs. Fix any carpet that is loose or worn.  Avoid having throw rugs at the top or bottom of the stairs. If you do have throw rugs, attach them to the floor with carpet tape.  Make sure that you have a light switch at the top of the stairs and the bottom of the stairs. If you do not have them, ask someone to add them for you. What else can I do to help prevent falls?  Wear shoes that:  Do not have high heels.  Have rubber bottoms.  Are comfortable and fit you well.  Are closed at the toe. Do not wear sandals.  If you use a stepladder:  Make sure that it is fully opened. Do not climb a closed stepladder.  Make sure that both sides of the stepladder are locked into place.  Ask someone to hold it for you, if possible.  Clearly mark and make  sure that you can see:  Any grab bars or handrails.  First and last steps.  Where the edge of each step is.  Use tools that help you move around (mobility aids) if they are needed. These include:  Canes.  Walkers.  Scooters.  Crutches.  Turn on the lights when you go into a dark area. Replace any light bulbs as soon as they burn out.  Set up your furniture so you have a clear path. Avoid moving your furniture around.  If any of your floors are uneven, fix them.  If there are any pets around you, be aware of where they are.  Review your medicines with your doctor. Some medicines can make you feel dizzy. This can increase your chance of falling. Ask your doctor what other things that you can do to help prevent falls. This information is not intended to replace advice given to you by your health care provider. Make sure you discuss any questions you have with your health care provider. Document Released: 02/02/2009 Document Revised: 09/14/2015 Document Reviewed: 05/13/2014 Elsevier Interactive Patient Education  2017 [employer].

## 2018-06-11 NOTE — Progress Notes (Signed)
Subjective:   Phillip Larson is a 84 y.o. male who presents for Medicare Annual/Subsequent preventive examination.  Review of Systems:   Cardiac Risk Factors include: advanced age (>65men, >93 women);male gender;hypertension;family history of premature cardiovascular disease;dyslipidemia     Objective:    Vitals: BP 124/68   Pulse 65   Temp 97.6 F (36.4 C) (Oral)   Ht 5\' 10"  (1.778 m)   Wt 184 lb (83.5 kg)   SpO2 95%   BMI 26.40 kg/m   Body mass index is 26.4 kg/m.  Advanced Directives 06/11/2018 06/11/2018 12/09/2017 07/15/2017 06/06/2017 06/04/2017 03/24/2017  Does Patient Have a Medical Advance Directive? No No Yes No Yes Yes No  Type of Advance Directive - Public librarian;Living will - [place];Living will [place];Living will -  Does patient want to make changes to medical advance directive? Yes (MAU/Ambulatory/Procedural Areas - Information given) Yes (MAU/Ambulatory/Procedural Areas - Information given) - No - Patient declined No - Patient declined No - Patient declined -  Copy of [place] in Chart? - - No - copy requested - No - copy requested No - copy requested -  Pre-existing out of facility DNR order (yellow form or pink MOST form) - - - - - - -    Tobacco Social History   Tobacco Use  Smoking Status Former Smoker  . Packs/day: 2.00  . Years: 25.00  . Pack years: 50.00  . Types: Cigarettes  . Last attempt to quit: 04/22/1992  . Years since quitting: 26.1  Smokeless Tobacco Never Used     Counseling given: Not Answered   Clinical Intake:  Pre-visit preparation completed: Yes  Pain : 0-10 Pain Score: 4  Pain Type: Chronic pain Pain Location: Shoulder Pain Orientation: Right Pain Descriptors / Indicators: Sore, Tender Pain Onset: More than a month ago Pain Frequency: Intermittent Pain Relieving Factors: seeing ortho, had injection.  Effect of Pain on Daily Activities: does not  allow him to play tennis   Pain Relieving Factors: seeing ortho, had injection.   BMI - recorded: 26.4 Nutritional Status: BMI 25 -29 Overweight Nutritional Risks: None Diabetes: No  How often do you need to have someone help you when you read instructions, pamphlets, or other written materials from your doctor or pharmacy?: 1 - Never What is the last grade level you completed in school?: 1 year of college  Interpreter Needed?: No     Past Medical History:  Diagnosis Date  . Actinic keratosis   . Alzheimer's disease ([place])   . Anal fissure   . Anxiety state, unspecified   . Benign neoplasm of colon   . BENIGN PROSTATIC HYPERTROPHY, HX OF   . BPH (benign prostatic hypertrophy) with urinary obstruction 06/22/2008   Qualifier: Diagnosis of  By: Johnsie Cancel, MD, Rona Ravens   . Cerebrovascular disease, unspecified   . Coronary atherosclerosis of native coronary artery   . Depressive disorder, not elsewhere classified   . Diverticulosis of colon (without mention of hemorrhage)   . Essential hypertension, benign   . External hemorrhoids without mention of complication   . Insomnia, unspecified   . Mixed hyperlipidemia   . Osteoarthrosis, unspecified whether generalized or localized, unspecified site   . Other premature beats   . Other psoriasis   . Pain in joint, lower leg   . Pain in joint, site unspecified   . Palpitations   . Pressure ulcer   . Reflux esophagitis   .  Unspecified essential hypertension   . Unspecified hereditary and idiopathic peripheral neuropathy   . Unspecified hypothyroidism   . Unspecified vitamin D deficiency    Past Surgical History:  Procedure Laterality Date  . CERVICAL DISC SURGERY    . EYE SURGERY Left 02/07/2014   Cataract Surgery Left Eye  . KNEE SURGERY  07/18/2006   Dr French Ana   . LAMINECTOMY C3 disc  1972  . LEFT HEART CATHETERIZATION WITH CORONARY ANGIOGRAM N/A 01/28/2013   Procedure: LEFT HEART CATHETERIZATION WITH CORONARY  ANGIOGRAM;  Surgeon: Josue Hector, MD;  Location: [facility] CATH LAB;  Service: Cardiovascular;  Laterality: N/A;  . MULTIPLE TOOTH EXTRACTIONS  2014  . RETINAL TEAR REPAIR CRYOTHERAPY     Dr Wayne Sever    Family History  Problem Relation Age of Onset  . Cancer Father        pancreatic  . Cancer Mother        pancreatic   Social History   Socioeconomic History  . Marital status: Married    Spouse name: Not on file  . Number of children: Not on file  . Years of education: Not on file  . Highest education level: Not on file  Occupational History  . Occupation: retired  Scientific laboratory technician  . Financial resource strain: Not hard at all  . Food insecurity:    Worry: Never true    Inability: Never true  . Transportation needs:    Medical: No    Non-medical: No  Tobacco Use  . Smoking status: Former Smoker    Packs/day: 2.00    Years: 25.00    Pack years: 50.00    Types: Cigarettes    Last attempt to quit: 04/22/1992    Years since quitting: 26.1  . Smokeless tobacco: Never Used  Substance and Sexual Activity  . Alcohol use: Yes    Alcohol/week: 0.0 standard drinks    Comment: beer/wine/liquor daily  . Drug use: No  . Sexual activity: Not Currently  Lifestyle  . Physical activity:    Days per week: 2 days    Minutes per session: 90 min  . Stress: Not at all  Relationships  . Social connections:    Talks on phone: Once a week    Gets together: Three times a week    Attends religious service: More than 4 times per year    Active member of club or organization: No    Attends meetings of clubs or organizations: Never    Relationship status: Married  Other Topics Concern  . Not on file  Social History Narrative  . Not on file    Outpatient Encounter Medications as of 06/11/2018  Medication Sig  . ALPRAZolam (XANAX) 0.5 MG tablet TAKE 1 TABLET BY MOUTH TWICE DAILY AS NEEDED  . aspirin EC 81 MG tablet Take 81 mg by mouth at bedtime.  [name] b complex vitamins tablet Take 1 tablet by  mouth daily.  [name] CARTIA XT 240 MG 24 hr capsule TAKE 1 CAPSULE BY MOUTH ONCE DAILY  . cetirizine (ZYRTEC) 10 MG tablet Take 1 tablet (10 mg total) by mouth daily.  . cholecalciferol (VITAMIN D) 1000 UNITS tablet Take 1,000 Units by mouth daily.  [name] galantamine (RAZADYNE ER) 24 MG 24 hr capsule Take one capsule by mouth once daily with breakfast  . isosorbide mononitrate (IMDUR) 30 MG 24 hr tablet TAKE 1 TABLET BY MOUTH ONCE DAILY  . levothyroxine (SYNTHROID, LEVOTHROID) 88 MCG tablet TAKE 1 TABLET BY MOUTH ONCE  DAILY BEFORE BREAKFAST  . lovastatin (MEVACOR) 40 MG tablet TAKE 1 TABLET BY MOUTH ONCE DAILY AT BEDTIME AS NEEDED FOR CHOLESTEROL  . MAGNESIUM PO Take by mouth daily.  . memantine (NAMENDA) 10 MG tablet Take 1 tablet (10 mg total) by mouth 2 (two) times daily.  . metroNIDAZOLE (METROCREAM) 0.75 % cream Apply 1.61 application topically 2 (two) times daily.  . mirabegron ER (MYRBETRIQ) 50 MG TB24 tablet Take 50 mg by mouth every other day.  . Multiple Vitamin (MULTIVITAMIN WITH MINERALS) TABS tablet Take 1 tablet by mouth daily.  . nitroGLYCERIN (NITROSTAT) 0.4 MG SL tablet One under the tongue if needed for chest tightness. Repeat in 5 minutes if needed. Repeat again in 5 minutes if needed.  . sertraline (ZOLOFT) 50 MG tablet Take 1 tablet (50 mg total) by mouth daily.  . tamsulosin (FLOMAX) 0.4 MG CAPS capsule TAKE ONE CAPSULE BY MOUTH ONCE DAILY TO HELP RELIEVE OBSTRUCTION  . traZODone (DESYREL) 100 MG tablet TAKE 1 TABLET BY MOUTH EVERY DAY AT BEDTIME TO HELP REST  . zinc gluconate 50 MG tablet Take 50 mg by mouth at bedtime.  [name] zolpidem (AMBIEN) 10 MG tablet TAKE 1 TABLET BY MOUTH AT BEDTIME  . [DISCONTINUED] amoxicillin-clavulanate (AUGMENTIN) 875-125 MG tablet Take 1 tablet by mouth 2 (two) times daily.  . [DISCONTINUED] pantoprazole (PROTONIX) 40 MG tablet Take 1 tablet (40 mg total) by mouth daily.   No facility-administered encounter medications on file as of 06/11/2018.      Activities of Daily Living In your present state of health, do you have any difficulty performing the following activities: 06/11/2018  Hearing? Y  Vision? N  Difficulty concentrating or making decisions? Y  Walking or climbing stairs? N  Dressing or bathing? N  Doing errands, shopping? N  Preparing Food and eating ? N  Using the Toilet? N  In the past six months, have you accidently leaked urine? Y  Do you have problems with loss of bowel control? N  Managing your Medications? Y  Managing your Finances? Y  Comment wife does management of finances  Housekeeping or managing your Housekeeping? Y  Comment wife does housekeeping  Some recent data might be hidden    Patient Care Team: Lauree Chandler, NP as PCP - General (Geriatric Medicine) Josue Hector, MD as PCP - Cardiology (Cardiology) Clent Jacks, MD as Consulting Physician (Ophthalmology) Lamonte Sakai Rose Fillers, MD as Consulting Physician (Pulmonary Disease) Angelia Mould, MD as Consulting Physician (Vascular Surgery) Earlie Server, MD as Consulting Physician (Orthopedic Surgery) Wilford Corner, MD as Consulting Physician (Gastroenterology) Chesley Mires, MD as Consulting Physician (Pulmonary Disease)   Assessment:   This is a routine wellness examination for Phillip Larson.  Exercise Activities and Dietary recommendations Current Exercise Habits: The patient does not participate in regular exercise at present, Exercise limited by: Other - see comments(wife has been sick)  Goals    . Exercise 3x per week (30 min per time)     Pt just got a membership to the gym and would like to go 3 days a week    . Increase water intake     Starting 05/21/16, I will attempt to increase my water intake from 1 glass of water per day to 3 glasses per day.        Fall Risk Fall Risk  06/11/2018 06/11/2018 12/09/2017 08/06/2017 07/15/2017  Falls in the past year? 0 1 Yes No No  Number falls in past yr: 0 0 1 - -  Comment - - - - -   Injury with Fall? 1 1 Yes - -  Comment - - - - -   Is the patient's home free of loose throw rugs in walkways, pet beds, electrical cords, etc?   no      Grab bars in the bathroom? yes      Handrails on the stairs?   yes      Adequate lighting?   yes  Timed Get Up and Go Performed: 2 secs   Depression Screen PHQ 2/9 Scores 12/09/2017 06/06/2017 06/04/2017 05/21/2016  PHQ - 2 Score 6 0 0 0  PHQ- 9 Score 12 - - -    Cognitive Function MMSE - Mini Mental State Exam 06/11/2018 06/04/2017 05/21/2016 11/29/2015  Orientation to time 4 5 3 5   Orientation to Place 5 5 5 5   Registration 3 3 3 3   Attention/ Calculation 5 5 4 5   Recall 1 0 2 3  Language- name 2 objects 2 2 2 2   Language- repeat 1 1 1 1   Language- follow 3 step command 3 3 3 3   Language- read & follow direction 1 1 1 1   Write a sentence 1 1 1 1   Copy design 0 1 1 1   Total score 26 27 26 30         Immunization History  Administered Date(s) Administered  . Influenza,inj,Quad PF,6+ Mos 03/03/2013, 05/24/2015, 05/21/2016  . Pneumococcal Conjugate-13 05/21/2016  . Pneumococcal Polysaccharide-23 11/14/1998  . Td 11/14/1998, 12/09/2017  . Tdap 06/18/2011  . Zoster 05/27/2007    Qualifies for Shingles Vaccine? Yes, had zoster.   Screening Tests Health Maintenance  Topic Date Due  . PNA vac Low Risk Adult (2 of 2 - PPSV23) 05/21/2017  . INFLUENZA VACCINE  11/20/2017  . TETANUS/TDAP  12/10/2027   Cancer Screenings: Lung: Low Dose CT Chest recommended if Age 24-80 years, 30 pack-year currently smoking OR have quit w/in 15years. Patient does not qualify. Colorectal: aged out.  Additional Screenings:  Hepatitis C Screening: no.       Plan:     I have personally reviewed and noted the following in the patient's chart:   . Medical and social history . Use of alcohol, tobacco or illicit drugs  . Current medications and supplements . Functional ability and status . Nutritional status . Physical activity . Advanced  directives . List of other physicians . Hospitalizations, surgeries, and ER visits in previous 12 months . Vitals . Screenings to include cognitive, depression, and falls . Referrals and appointments  In addition, I have reviewed and discussed with patient certain preventive protocols, quality metrics, and best practice recommendations. A written personalized care plan for preventive services as well as general preventive health recommendations were provided to patient.     Lauree Chandler, NP  06/11/2018

## 2018-06-11 NOTE — Patient Instructions (Signed)
Decrease Ambien to 5 mg by mouth daily at bedtime  Try to increase exercise slowly to goal   Follow up in 6 months.   Get These Tests  Blood Pressure- Have your blood pressure checked by your healthcare provider at least once a year.  Normal blood pressure is 120/80. Goal for most <140/90.  Weight- Have your body mass index (BMI) calculated to screen for obesity.  BMI is a measure of body fat based on height and weight.  You can calculate your own BMI at [employer]  Cholesterol- Have your cholesterol checked every year.  Diabetes- Have your blood sugar checked every year if you have high blood pressure, high cholesterol, a family history of diabetes or if you are overweight.  Pap Test - Have a pap test every 1 to 5 years if you have been sexually active.  If you are older than 65 and recent pap tests have been normal you may not need additional pap tests.  In addition, if you have had a hysterectomy  for benign disease additional pap tests are not necessary.  Mammogram-Yearly mammograms are essential for early detection of breast cancer  Screening for Colon Cancer- Colonoscopy starting at age 15. Screening may begin sooner depending on your family history and other health conditions.  Follow up colonoscopy as directed by your Gastroenterologist.  Screening for Osteoporosis- Screening begins at age 54 with bone density scanning, sooner if you are at higher risk for developing Osteoporosis.   Get these medicines  Calcium with Vitamin D- Your body requires 1200-1500 mg of Calcium a day and [phone] IU of Vitamin D a day.  You can only absorb 500 mg of Calcium at a time therefore Calcium must be taken in 2 or 3 separate doses throughout the day.  Hormones- Hormone therapy has been associated with increased risk for certain cancers and heart disease.  Talk to your healthcare provider about if you need relief from menopausal symptoms.  Aspirin- Ask your healthcare provider  about taking Aspirin to prevent Heart Disease and Stroke.   Get these Immuniztions  Flu shot- Every fall  Pneumonia shot- Once after the age of 21; if you are younger ask your healthcare provider if you need a pneumonia shot.  Tetanus- Every ten years.  Shingrix- Once after the age of 80 to prevent shingles.   Take these steps  Don't smoke- Your healthcare provider can help you quit. For tips on how to quit, ask your healthcare provider or go to www.smokefree.gov or call 1-800 QUIT-NOW.  Be physically active- Exercise 5 days a week for a minimum of 30 minutes.  If you are not already physically active, start slow and gradually work up to 30 minutes of moderate physical activity.  Try walking, dancing, bike riding, swimming, etc.  Eat a healthy diet- Eat a variety of healthy foods such as fruits, vegetables, whole grains, low fat milk, low fat cheeses, yogurt, lean meats, chicken, fish, eggs, dried beans, tofu, etc.  For more information go to www.thenutritionsource.org  Dental visit- Brush and floss teeth twice daily; visit your dentist twice a year.  Eye exam- Visit your Optometrist or Ophthalmologist yearly.  Drink alcohol in moderation- Limit alcohol intake to one drink or less a day.  Never drink and drive.  Depression- Your emotional health is as important as your physical health.  If you're feeling down or losing interest in things you normally enjoy, please talk to your healthcare provider.  Seat Belts- can save your life; always  wear one  Smoke/Carbon Monoxide detectors- These detectors need to be installed on the appropriate level of your home.  Replace batteries at least once a year.  Violence- If anyone is threatening or hurting you, please tell your healthcare provider.  Living Will/ Health care power of attorney- Discuss with your healthcare provider and family.

## 2018-06-11 NOTE — Progress Notes (Signed)
Careteam: Patient Care Team: Lauree Chandler, NP as PCP - General (Geriatric Medicine) Josue Hector, MD as PCP - Cardiology (Cardiology) Clent Jacks, MD as Consulting Physician (Ophthalmology) Lamonte Sakai Rose Fillers, MD as Consulting Physician (Pulmonary Disease) Angelia Mould, MD as Consulting Physician (Vascular Surgery) Earlie Server, MD as Consulting Physician (Orthopedic Surgery) Wilford Corner, MD as Consulting Physician (Gastroenterology) Chesley Mires, MD as Consulting Physician (Pulmonary Disease)  Advanced Directive information Does Patient Have a Medical Advance Directive?: No, Does patient want to make changes to medical advance directive?: Yes (MAU/Ambulatory/Procedural Areas - Information given)  Allergies  Allergen Reactions  . Iodine Swelling    Internal iodine  . Iohexol Hives         Chief Complaint  Patient presents with  . Medical Management of Chronic Issues    6 month follow up     HPI: Patient is a 84 y.o. male seen in the office today for routine follow up.   HTN/angina - stable on cardizem CD, imdur and prn SL NTG. He takes ASA daily for anticoagulation. Followed by cardio Dr Johnsie Cancel  Hypothyroidism - stable on levothyroxine. TSH 2.8  Insomnia - stable on Ambien, xanax and trazodone  Anxiety/recurrent MDD -  Mood stable on sertraline and trazodone  BPH with lower urinary tract sx's - stable on myrbetriq and flomax; followed by urology. Uses myrbetriq every other day.   Hyperlipidemia - stable on lovastatin. LDL 84, triglycerides 222  Alzheimer's dementia - stable on razadyne ER and namenda. MMSE 25//30  Allergic rhinitis - stable on cetirizine  CKD - stage  3. Cr 1.34   Review of Systems:  Review of Systems  Constitutional: Negative for chills, fever and weight loss.  HENT: Negative for tinnitus.   Respiratory: Negative for cough, sputum production and shortness of breath.   Cardiovascular: Negative for chest  pain, palpitations and leg swelling.  Gastrointestinal: Negative for abdominal pain, constipation, diarrhea and heartburn.  Genitourinary: Negative for dysuria, frequency and urgency.  Musculoskeletal: Positive for myalgias. Negative for back pain, falls and joint pain.  Skin: Negative.   Neurological: Negative for dizziness and headaches.  Psychiatric/Behavioral: Negative for depression and memory loss. The patient does not have insomnia.     Past Medical History:  Diagnosis Date  . Actinic keratosis   . Alzheimer's disease ([place])   . Anal fissure   . Anxiety state, unspecified   . Benign neoplasm of colon   . BENIGN PROSTATIC HYPERTROPHY, HX OF   . BPH (benign prostatic hypertrophy) with urinary obstruction 06/22/2008   Qualifier: Diagnosis of  By: Johnsie Cancel, MD, Rona Ravens   . Cerebrovascular disease, unspecified   . Coronary atherosclerosis of native coronary artery   . Depressive disorder, not elsewhere classified   . Diverticulosis of colon (without mention of hemorrhage)   . Essential hypertension, benign   . External hemorrhoids without mention of complication   . Insomnia, unspecified   . Mixed hyperlipidemia   . Osteoarthrosis, unspecified whether generalized or localized, unspecified site   . Other premature beats   . Other psoriasis   . Pain in joint, lower leg   . Pain in joint, site unspecified   . Palpitations   . Pressure ulcer   . Reflux esophagitis   . Unspecified essential hypertension   . Unspecified hereditary and idiopathic peripheral neuropathy   . Unspecified hypothyroidism   . Unspecified vitamin D deficiency    Past Surgical History:  Procedure Laterality Date  . CERVICAL  [place] SURGERY    . EYE SURGERY Left 02/07/2014   Cataract Surgery Left Eye  . KNEE SURGERY  07/18/2006   Dr French Ana   . LAMINECTOMY C3 disc  1972  . LEFT HEART CATHETERIZATION WITH CORONARY ANGIOGRAM N/A 01/28/2013   Procedure: LEFT HEART CATHETERIZATION WITH CORONARY  ANGIOGRAM;  Surgeon: Josue Hector, MD;  Location: [facility] CATH LAB;  Service: Cardiovascular;  Laterality: N/A;  . MULTIPLE TOOTH EXTRACTIONS  2014  . RETINAL TEAR REPAIR CRYOTHERAPY     Dr Wayne Sever    Social History:   reports that he quit smoking about 26 years ago. His smoking use included cigarettes. He has a 50.00 pack-year smoking history. He has never used smokeless tobacco. He reports current alcohol use. He reports that he does not use drugs.  Family History  Problem Relation Age of Onset  . Cancer Father        pancreatic  . Cancer Mother        pancreatic    Medications: Patient's Medications  New Prescriptions   ZOSTER VACCINE ADJUVANTED (SHINGRIX) INJECTION    Inject 0.5 mLs into the muscle once for 1 dose.  Previous Medications   ASPIRIN EC 81 MG TABLET    Take 81 mg by mouth at bedtime.   B COMPLEX VITAMINS TABLET    Take 1 tablet by mouth daily.   CARTIA XT 240 MG 24 HR CAPSULE    TAKE 1 CAPSULE BY MOUTH ONCE DAILY   CETIRIZINE (ZYRTEC) 10 MG TABLET    Take 1 tablet (10 mg total) by mouth daily.   CHOLECALCIFEROL (VITAMIN D) 1000 UNITS TABLET    Take 1,000 Units by mouth daily.   ISOSORBIDE MONONITRATE (IMDUR) 30 MG 24 HR TABLET    TAKE 1 TABLET BY MOUTH ONCE DAILY   LEVOTHYROXINE (SYNTHROID, LEVOTHROID) 88 MCG TABLET    TAKE 1 TABLET BY MOUTH ONCE DAILY BEFORE BREAKFAST   LOVASTATIN (MEVACOR) 40 MG TABLET    TAKE 1 TABLET BY MOUTH ONCE DAILY AT BEDTIME AS NEEDED FOR CHOLESTEROL   MAGNESIUM PO    Take by mouth daily.   METRONIDAZOLE (METROCREAM) 0.75 % CREAM    Apply 4.09 application topically 2 (two) times daily.   MULTIPLE VITAMIN (MULTIVITAMIN WITH MINERALS) TABS TABLET    Take 1 tablet by mouth daily.   NITROGLYCERIN (NITROSTAT) 0.4 MG SL TABLET    One under the tongue if needed for chest tightness. Repeat in 5 minutes if needed. Repeat again in 5 minutes if needed.   SERTRALINE (ZOLOFT) 50 MG TABLET    Take 1 tablet (50 mg total) by mouth daily.   TAMSULOSIN (FLOMAX)  0.4 MG CAPS CAPSULE    TAKE ONE CAPSULE BY MOUTH ONCE DAILY TO HELP RELIEVE OBSTRUCTION   TRAZODONE (DESYREL) 100 MG TABLET    TAKE 1 TABLET BY MOUTH EVERY DAY AT BEDTIME TO HELP REST   ZINC GLUCONATE 50 MG TABLET    Take 50 mg by mouth at bedtime.   ZOLPIDEM (AMBIEN) 10 MG TABLET    TAKE 1 TABLET BY MOUTH AT BEDTIME  Modified Medications   Modified Medication Previous Medication   ALPRAZOLAM (XANAX) 0.5 MG TABLET ALPRAZolam (XANAX) 0.5 MG tablet      Take 1 tablet (0.5 mg total) by mouth 2 (two) times daily as needed.    TAKE 1 TABLET BY MOUTH TWICE DAILY AS NEEDED   GALANTAMINE (RAZADYNE ER) 24 MG 24 HR CAPSULE galantamine (RAZADYNE ER) 24 MG 24 hr capsule  Take one capsule by mouth once daily with breakfast    Take one capsule by mouth once daily with breakfast   MEMANTINE (NAMENDA) 10 MG TABLET memantine (NAMENDA) 10 MG tablet      Take 1 tablet (10 mg total) by mouth 2 (two) times daily.    Take 1 tablet (10 mg total) by mouth 2 (two) times daily.   MIRABEGRON ER (MYRBETRIQ) 50 MG TB24 TABLET mirabegron ER (MYRBETRIQ) 50 MG TB24 tablet      Take 1 tablet (50 mg total) by mouth every other day.    Take 50 mg by mouth every other day.  Discontinued Medications   No medications on file     Physical Exam:  Vitals:   06/11/18 1101  BP: 124/68  Pulse: 65  Temp: 97.6 F (36.4 C)  TempSrc: Oral  SpO2: 95%  Weight: 184 lb (83.5 kg)  Height: 5\' 10"  (1.778 m)   Body mass index is 26.4 kg/m.  Physical Exam Constitutional:      Appearance: He is well-developed.  HENT:     Head: Normocephalic and atraumatic.     Right Ear: Tympanic membrane and external ear normal.     Left Ear: Tympanic membrane and external ear normal.     Nose: Rhinorrhea present.     Mouth/Throat:     Pharynx: No oropharyngeal exudate.  Eyes:     Conjunctiva/sclera: Conjunctivae normal.     Pupils: Pupils are equal, round, and reactive to light.  Neck:     Musculoskeletal: Neck supple.    Cardiovascular:     Rate and Rhythm: Normal rate and regular rhythm.  Pulmonary:     Effort: Pulmonary effort is normal. No respiratory distress.     Breath sounds: Normal breath sounds. No stridor.  Musculoskeletal:        General: No deformity.  Lymphadenopathy:     Cervical: No cervical adenopathy.  Neurological:     Mental Status: He is alert and oriented to person, place, and time.     Labs reviewed: Basic Metabolic Panel: Recent Labs    07/22/17 1348 06/09/18 0911  NA  --  140  K  --  4.5  CL  --  107  CO2  --  29  GLUCOSE  --  83  BUN  --  15  CREATININE  --  1.34*  CALCIUM  --  9.3  TSH 2.80 3.76   Liver Function Tests: Recent Labs    06/09/18 0911  AST 25  ALT 25  BILITOT 0.5  PROT 6.1   No results for input(s): LIPASE, AMYLASE in the last 8760 hours. No results for input(s): AMMONIA in the last 8760 hours. CBC: Recent Labs    06/09/18 0911  WBC 5.0  NEUTROABS 2,775  HGB 14.8  HCT 43.4  MCV 96.7  PLT 222   Lipid Panel: Recent Labs    06/09/18 0911  CHOL 172  HDL 57  LDLCALC 84  TRIG 222*  CHOLHDL 3.0   TSH: Recent Labs    07/22/17 1348 06/09/18 0911  TSH 2.80 3.76   A1C: No results found for: HGBA1C   Assessment/Plan 1. Late onset Alzheimer's disease without behavioral disturbance ([place]) MMSE 25/30 declined from last year 27/30. No significant decline noted.  - galantamine (RAZADYNE ER) 24 MG 24 hr capsule; Take one capsule by mouth once daily with breakfast  Dispense: 90 capsule; Refill: 1 - memantine (NAMENDA) 10 MG tablet; Take 1 tablet (10 mg total) by  mouth 2 (two) times daily.  Dispense: 60 tablet; Refill: 3  2. Essential hypertension, benign Stable on cartia 240 mg by mouth daily  3. Mixed hyperlipidemia -continues on lovastatin, LDL 84  4. Hypothyroidism, unspecified type TSH 3.7, continue on synthroid 27mcg   5. Primary insomnia -discussed adverse long term effects of Ambien, will decrease to 5 mg qhs.  Continues on trazodone.  - ALPRAZolam (XANAX) 0.5 MG tablet; Take 1 tablet (0.5 mg total) by mouth 2 (two) times daily as needed.  Dispense: 60 tablet; Refill: 0 - zolpidem (AMBIEN) 5 MG tablet; Take 1 tablet (5 mg total) by mouth at bedtime.  Dispense: 30 tablet; Refill: 0  6. Overactive bladder -controlled on myrbetriq. - mirabegron ER (MYRBETRIQ) 50 MG TB24 tablet; Take 1 tablet (50 mg total) by mouth every other day.  Dispense: 30 tablet; Refill: 2  7. Environmental allergies Stable on zyrtec  8. Anxiety state Controlled at this time. Continues on zoloft and xanax PRN  9. Encounter for immunization - Flu vaccine HIGH DOSE PF  Next appt: 6 months, sooner if needed.  Carlos American. Gross, [facility] [phone]

## 2018-06-16 DIAGNOSIS — R3915 Urgency of urination: Secondary | ICD-10-CM | POA: Diagnosis not present

## 2018-06-18 ENCOUNTER — Other Ambulatory Visit: Payer: Self-pay | Admitting: Nurse Practitioner

## 2018-06-18 DIAGNOSIS — F5101 Primary insomnia: Secondary | ICD-10-CM

## 2018-06-18 NOTE — Telephone Encounter (Signed)
Patient had an appointment on 06/11/2018 and the rx was suppose to had been sent electroic.Call pharmacy and they stated they did not receive the request. I called the rx in today

## 2018-06-22 DIAGNOSIS — H40013 Open angle with borderline findings, low risk, bilateral: Secondary | ICD-10-CM | POA: Diagnosis not present

## 2018-06-22 DIAGNOSIS — H04123 Dry eye syndrome of bilateral lacrimal glands: Secondary | ICD-10-CM | POA: Diagnosis not present

## 2018-06-22 DIAGNOSIS — Z961 Presence of intraocular lens: Secondary | ICD-10-CM | POA: Diagnosis not present

## 2018-06-23 ENCOUNTER — Other Ambulatory Visit: Payer: Self-pay | Admitting: Nurse Practitioner

## 2018-06-23 DIAGNOSIS — F5101 Primary insomnia: Secondary | ICD-10-CM

## 2018-06-23 MED ORDER — ZOLPIDEM TARTRATE 5 MG PO TABS: 5.0000 mg | ORAL_TABLET | Freq: Every day | ORAL | 0 refills | Status: DC

## 2018-06-23 NOTE — Telephone Encounter (Signed)
Last filled 05/26/2018    Database verified and compliance confirmed   

## 2018-06-24 ENCOUNTER — Other Ambulatory Visit: Payer: Self-pay | Admitting: *Deleted

## 2018-06-24 MED ORDER — LEVOTHYROXINE SODIUM 88 MCG PO TABS: ORAL_TABLET | ORAL | 1 refills | Status: DC

## 2018-06-24 NOTE — Telephone Encounter (Signed)
[facility] 

## 2018-07-03 ENCOUNTER — Ambulatory Visit (INDEPENDENT_AMBULATORY_CARE_PROVIDER_SITE_OTHER): Payer: Medicare Other | Admitting: Family

## 2018-07-03 ENCOUNTER — Other Ambulatory Visit: Payer: Self-pay

## 2018-07-03 ENCOUNTER — Encounter: Payer: Self-pay | Admitting: Family

## 2018-07-03 VITALS — BP 112/60 | HR 56 | Temp 97.6°F | Resp 18 | Ht 70.0 in | Wt 183.0 lb

## 2018-07-03 DIAGNOSIS — J0100 Acute maxillary sinusitis, unspecified: Secondary | ICD-10-CM

## 2018-07-03 MED ORDER — AMOXICILLIN-POT CLAVULANATE 875-125 MG PO TABS: 1.0000 | ORAL_TABLET | Freq: Two times a day (BID) | ORAL | 0 refills | Status: AC

## 2018-07-03 MED ORDER — SACCHAROMYCES BOULARDII 250 MG PO CAPS: 250.0000 mg | ORAL_CAPSULE | Freq: Two times a day (BID) | ORAL | 0 refills | Status: AC

## 2018-07-03 NOTE — Progress Notes (Signed)
Provider: Kingdom Vanzanten FNP-C  Lauree Chandler, NP  Patient Care Team: Lauree Chandler, NP as PCP - General (Geriatric Medicine) Josue Hector, MD as PCP - Cardiology (Cardiology) Clent Jacks, MD as Consulting Physician (Ophthalmology) Lamonte Sakai Rose Fillers, MD as Consulting Physician (Pulmonary Disease) Angelia Mould, MD as Consulting Physician (Vascular Surgery) Earlie Server, MD as Consulting Physician (Orthopedic Surgery) Wilford Corner, MD as Consulting Physician (Gastroenterology) Chesley Mires, MD as Consulting Physician (Pulmonary Disease)  Extended Emergency Contact Information Primary Emergency Contact: Clanton,Peggy "Joycelyn Schmid" Address: [address]          [place], [place] Phone: [phone] Mobile Phone: [phone] Relation: Spouse   Goals of care: Advanced Directive information Advanced Directives 07/03/2018  Does Patient Have a Medical Advance Directive? No  Type of Advance Directive -  Does patient want to make changes to medical advance directive? No - Patient declined  Copy of [facility] in Chart? -  Pre-existing out of facility DNR order (yellow form or pink MOST form) -     Chief Complaint  Patient presents with  . Acute Visit    Patient states has been coughing, sneezing, and congestion.     HPI:  Pt is a 84 y.o. male seen today for an acute visit for evaluation of cough,sneezing and nasal congestion x 1 month.he states symptoms seems to be worsening.He reports pressure across his forward and bilateral face.He has had yellow drainage at times.He coughs up mucus but swallows it.He states has dizziness worst whenever he gets up though this is ongoing.His dizziness is not worst than usual.He does follow up with his cardiology.Hedenies any fever,chills,shortness of breath,wheezing or chest pain.   Past Medical History:  Diagnosis Date  . Actinic keratosis   . Alzheimer's disease ([place])   .  Anal fissure   . Anxiety state, unspecified   . Benign neoplasm of colon   . BENIGN PROSTATIC HYPERTROPHY, HX OF   . BPH (benign prostatic hypertrophy) with urinary obstruction 06/22/2008   Qualifier: Diagnosis of  By: Johnsie Cancel, MD, Rona Ravens   . Cerebrovascular disease, unspecified   . Coronary atherosclerosis of native coronary artery   . Depressive disorder, not elsewhere classified   . Diverticulosis of colon (without mention of hemorrhage)   . Essential hypertension, benign   . External hemorrhoids without mention of complication   . Insomnia, unspecified   . Mixed hyperlipidemia   . Osteoarthrosis, unspecified whether generalized or localized, unspecified site   . Other premature beats   . Other psoriasis   . Pain in joint, lower leg   . Pain in joint, site unspecified   . Palpitations   . Pressure ulcer   . Reflux esophagitis   . Unspecified essential hypertension   . Unspecified hereditary and idiopathic peripheral neuropathy   . Unspecified hypothyroidism   . Unspecified vitamin D deficiency    Past Surgical History:  Procedure Laterality Date  . CERVICAL DISC SURGERY    . EYE SURGERY Left 02/07/2014   Cataract Surgery Left Eye  . KNEE SURGERY  07/18/2006   Dr French Ana   . LAMINECTOMY C3 disc  1972  . LEFT HEART CATHETERIZATION WITH CORONARY ANGIOGRAM N/A 01/28/2013   Procedure: LEFT HEART CATHETERIZATION WITH CORONARY ANGIOGRAM;  Surgeon: Josue Hector, MD;  Location: [facility] CATH LAB;  Service: Cardiovascular;  Laterality: N/A;  . MULTIPLE TOOTH EXTRACTIONS  2014  . RETINAL TEAR REPAIR CRYOTHERAPY     Dr Wayne Sever  Allergies  Allergen Reactions  . Iodine Swelling    Internal iodine  . Iohexol Hives         Outpatient Encounter Medications as of 07/03/2018  Medication Sig  . ALPRAZolam (XANAX) 0.5 MG tablet Take 1 tablet (0.5 mg total) by mouth 2 (two) times daily as needed.  [name] aspirin EC 81 MG tablet Take 81 mg by mouth at bedtime.  [name] b complex vitamins  tablet Take 1 tablet by mouth daily.  [name] CARTIA XT 240 MG 24 hr capsule TAKE 1 CAPSULE BY MOUTH ONCE DAILY  . cetirizine (ZYRTEC) 10 MG tablet Take 1 tablet (10 mg total) by mouth daily.  . cholecalciferol (VITAMIN D) 1000 UNITS tablet Take 1,000 Units by mouth daily.  [name] galantamine (RAZADYNE ER) 24 MG 24 hr capsule Take one capsule by mouth once daily with breakfast  . isosorbide mononitrate (IMDUR) 30 MG 24 hr tablet TAKE 1 TABLET BY MOUTH ONCE DAILY  . levothyroxine (SYNTHROID, LEVOTHROID) 88 MCG tablet Take one tablet by mouth once daily on empty stomach 30 minutes before breakfast  . lovastatin (MEVACOR) 40 MG tablet TAKE 1 TABLET BY MOUTH ONCE DAILY AT BEDTIME AS NEEDED FOR CHOLESTEROL  . MAGNESIUM PO Take by mouth daily.  . memantine (NAMENDA) 10 MG tablet Take 1 tablet (10 mg total) by mouth 2 (two) times daily.  . metroNIDAZOLE (METROCREAM) 0.75 % cream Apply 1.61 application topically 2 (two) times daily.  . mirabegron ER (MYRBETRIQ) 50 MG TB24 tablet Take 1 tablet (50 mg total) by mouth every other day.  . Multiple Vitamin (MULTIVITAMIN WITH MINERALS) TABS tablet Take 1 tablet by mouth daily.  . nitroGLYCERIN (NITROSTAT) 0.4 MG SL tablet One under the tongue if needed for chest tightness. Repeat in 5 minutes if needed. Repeat again in 5 minutes if needed.  . sertraline (ZOLOFT) 50 MG tablet Take 1 tablet (50 mg total) by mouth daily.  . tamsulosin (FLOMAX) 0.4 MG CAPS capsule TAKE ONE CAPSULE BY MOUTH ONCE DAILY TO HELP RELIEVE OBSTRUCTION  . traZODone (DESYREL) 100 MG tablet TAKE 1 TABLET BY MOUTH EVERY DAY AT BEDTIME TO HELP REST  . zinc gluconate 50 MG tablet Take 50 mg by mouth at bedtime.  [name] zolpidem (AMBIEN) 5 MG tablet Take 1 tablet (5 mg total) by mouth at bedtime.   No facility-administered encounter medications on file as of 07/03/2018.     Review of Systems  Constitutional: Positive for appetite change. Negative for chills and fever.  HENT: Positive for congestion,  rhinorrhea, sinus pressure, sinus pain and sneezing. Negative for sore throat.   Eyes: Negative for discharge, redness and itching.  Respiratory: Positive for cough. Negative for chest tightness, shortness of breath and wheezing.   Cardiovascular: Negative for chest pain, palpitations and leg swelling.  Gastrointestinal: Negative for abdominal distention, abdominal pain, diarrhea, nausea and vomiting.  Skin: Negative for color change, pallor and rash.  Neurological: Negative for dizziness, light-headedness and headaches.    Immunization History  Administered Date(s) Administered  . Influenza, High Dose Seasonal PF 06/11/2018  . Influenza,inj,Quad PF,6+ Mos 03/03/2013, 05/24/2015, 05/21/2016  . Pneumococcal Conjugate-13 05/21/2016  . Pneumococcal Polysaccharide-23 11/14/1998  . Td 11/14/1998, 12/09/2017  . Tdap 06/18/2011  . Zoster 05/27/2007   Pertinent  Health Maintenance Due  Topic Date Due  . PNA vac Low Risk Adult (2 of 2 - PPSV23) 05/21/2017  . INFLUENZA VACCINE  Completed   Fall Risk  07/03/2018 06/11/2018 06/11/2018 12/09/2017 08/06/2017  Falls in the past  year? 0 0 1 Yes No  Number falls in past yr: 0 0 0 1 -  Comment - - - - -  Injury with Fall? 0 1 1 Yes -  Comment - - - - -    Vitals:   07/03/18 0916  BP: 112/60  Pulse: (!) 56  Temp: 97.6 F (36.4 C)  TempSrc: Oral  SpO2: 97%  Weight: 183 lb (83 kg)  Height: 5\' 10"  (1.778 m)   Body mass index is 26.26 kg/m. Physical Exam Constitutional:      General: He is not in acute distress.    Appearance: Normal appearance. He is overweight. He is not ill-appearing.  HENT:     Head: Normocephalic.     Right Ear: Tympanic membrane, ear canal and external ear normal. There is no impacted cerumen.     Left Ear: Tympanic membrane, ear canal and external ear normal. There is no impacted cerumen.     Nose: Congestion and rhinorrhea present. No nasal tenderness or mucosal edema. Rhinorrhea is clear.     Right Sinus: Maxillary  sinus tenderness and frontal sinus tenderness present.     Left Sinus: Maxillary sinus tenderness and frontal sinus tenderness present.     Mouth/Throat:     Mouth: Mucous membranes are moist.     Pharynx: Oropharynx is clear. No oropharyngeal exudate or posterior oropharyngeal erythema.  Eyes:     General: No scleral icterus.       Right eye: No discharge.        Left eye: No discharge.     Conjunctiva/sclera: Conjunctivae normal.     Pupils: Pupils are equal, round, and reactive to light.  Neck:     Musculoskeletal: Normal range of motion. No neck rigidity or muscular tenderness.     Vascular: No carotid bruit.  Cardiovascular:     Rate and Rhythm: Normal rate and regular rhythm.     Pulses: Normal pulses.     Heart sounds: Normal heart sounds. No murmur. No friction rub. No gallop.   Pulmonary:     Effort: Pulmonary effort is normal. No respiratory distress.     Breath sounds: Normal breath sounds. No wheezing, rhonchi or rales.  Chest:     Chest wall: No tenderness.  Abdominal:     General: Bowel sounds are normal. There is no distension.     Palpations: Abdomen is soft.     Tenderness: There is no abdominal tenderness. There is no right CVA tenderness, left CVA tenderness, guarding or rebound.  Musculoskeletal: Normal range of motion.        General: No swelling or tenderness.     Right lower leg: No edema.     Left lower leg: No edema.  Lymphadenopathy:     Cervical: No cervical adenopathy.  Skin:    General: Skin is warm and dry.     Coloration: Skin is not pale.     Findings: No erythema or rash.  Neurological:     Mental Status: He is oriented to person, place, and time.     Cranial Nerves: No cranial nerve deficit.     Sensory: No sensory deficit.     Motor: No weakness.     Coordination: Coordination normal.     Gait: Gait normal.  Psychiatric:        Mood and Affect: Mood normal.        Behavior: Behavior normal.        Thought Content: Thought content  normal.        Judgment: Judgment normal.    Labs reviewed: Recent Labs    06/09/18 0911  NA 140  K 4.5  CL 107  CO2 29  GLUCOSE 83  BUN 15  CREATININE 1.34*  CALCIUM 9.3   Recent Labs    06/09/18 0911  AST 25  ALT 25  BILITOT 0.5  PROT 6.1   Recent Labs    06/09/18 0911  WBC 5.0  NEUTROABS 2,775  HGB 14.8  HCT 43.4  MCV 96.7  PLT 222   Lab Results  Component Value Date   TSH 3.76 06/09/2018   No results found for: HGBA1C Lab Results  Component Value Date   CHOL 172 06/09/2018   HDL 57 06/09/2018   LDLCALC 84 06/09/2018   TRIG 222 (H) 06/09/2018   CHOLHDL 3.0 06/09/2018    Significant Diagnostic Results in last 30 days:  No results found.  Assessment/Plan   Acute non-recurrent maxillary sinusitis Bilateral maxillary tender to palpation.nasal congestion with drainage. Encouraged to increased to increase fluid intake.  - Augmentin 875-125 mg tablet one by mouth twice daily x 10 days along with Florastor 250 mg capsule one by mouth twice daily x 10 days for antibiotic associated diarrhea prevention. Notify provider if symptoms worsen or not resolved.   Family/ staff Communication: Reviewed plan of care with patient.  Labs/tests ordered: None   Elif Yonts C Jabir Dahlem, NP

## 2018-07-03 NOTE — Patient Instructions (Signed)

## 2018-07-13 ENCOUNTER — Telehealth: Payer: Self-pay | Admitting: *Deleted

## 2018-07-13 NOTE — Telephone Encounter (Signed)
Patient walked in and advised that he's not feeling any better after seeing Dinah on 07/03/2018 and would like something else. Please advise

## 2018-07-14 ENCOUNTER — Other Ambulatory Visit: Payer: Self-pay

## 2018-07-14 ENCOUNTER — Other Ambulatory Visit: Payer: Self-pay | Admitting: Nurse Practitioner

## 2018-07-14 ENCOUNTER — Encounter: Payer: Medicare Other | Admitting: Nurse Practitioner

## 2018-07-14 NOTE — Telephone Encounter (Signed)
Left message on VM asking pt to call the office.

## 2018-07-14 NOTE — Telephone Encounter (Signed)
Call placed to [facility] and she set up an appointment for the tele-visit for 3/25

## 2018-07-14 NOTE — Progress Notes (Deleted)
Virtual Visit via Telephone Note  I connected with Phillip Larson on 07/14/18 at  3:15 PM EDT by telephone and verified that I am speaking with the correct person using two identifiers.   I discussed the limitations, risks, security and privacy concerns of performing an evaluation and management service by telephone and the availability of in person appointments. I also discussed with the patient that there may be a patient responsible charge related to this service. The patient expressed understanding and agreed to proceed.  This service is provided via telemedicine   No vital signs collected/recorded due to the encounter was a telemedicine visit.   Location of patient (ex: home, work):    Patient consents to a telephone visit:   Location of the provider (ex: office, home): office   Name of any referring provider: Sherrie Mustache, NP  Names of all persons participating in the telemedicine service and their role in the encounter:    Time spent on call:    History of Present Illness:    Observations/Objective:   Assessment and Plan:   Follow Up Instructions:    I discussed the assessment and treatment plan with the patient. The patient was provided an opportunity to ask questions and all were answered. The patient agreed with the plan and demonstrated an understanding of the instructions.   The patient was advised to call back or seek an in-person evaluation if the symptoms worsen or if the condition fails to improve as anticipated.  I provided *** minutes of non-face-to-face time during this encounter.

## 2018-07-14 NOTE — Telephone Encounter (Signed)
Lets see if we can set him up for a tele-visit and I can discuss things with him.

## 2018-07-14 NOTE — Telephone Encounter (Signed)
[facility] verified  Last refill 06/23/18

## 2018-07-14 NOTE — Telephone Encounter (Signed)
Patient wife, Vickii Chafe called back.  Please Call [phone] to set up Televisit.

## 2018-07-15 ENCOUNTER — Ambulatory Visit (INDEPENDENT_AMBULATORY_CARE_PROVIDER_SITE_OTHER): Payer: Medicare Other | Admitting: Nurse Practitioner

## 2018-07-15 DIAGNOSIS — F5101 Primary insomnia: Secondary | ICD-10-CM

## 2018-07-15 DIAGNOSIS — J302 Other seasonal allergic rhinitis: Secondary | ICD-10-CM | POA: Diagnosis not present

## 2018-07-15 MED ORDER — SUVOREXANT 10 MG PO TABS: 10.0000 mg | ORAL_TABLET | Freq: Every day | ORAL | 1 refills | Status: DC

## 2018-07-15 MED ORDER — FLUTICASONE PROPIONATE 50 MCG/ACT NA SUSP: 1.0000 | Freq: Two times a day (BID) | NASAL | 6 refills | Status: DC

## 2018-07-15 NOTE — Patient Instructions (Addendum)
To stop ambien 5 mg (not effective) and start belsomra 10 mg by mouth at bedtime.   To stop zyrtec 10 mg by mouth daily, start claritin 10 mg by mouth daily To use netipot or nasal saline wash daily flonase 1 spray into each nare twice daily

## 2018-07-15 NOTE — Progress Notes (Signed)
Virtual Visit via Telephone Note  I connected with Phillip Larson on 07/15/18 at  9:00 AM EDT by telephone and verified that I am speaking with the correct person using two identifiers.   I discussed the limitations, risks, security and privacy concerns of performing an evaluation and management service by telephone and the availability of in person appointments. I also discussed with the patient that there may be a patient responsible charge related to this service. The patient expressed understanding and agreed to proceed.  This service is provided via telemedicine  No vital signs collected/recorded due to the encounter was a telemedicine visit.   Location of patient (ex: home, work):  Home   Patient consents to a telephone visit: Yes  Location of the provider (ex: office, home):  Office   Name of any referring provider:  Sherrie Mustache, NP  Names of all persons participating in the telemedicine service and their role in the encounter: Ruthell Rummage - CMA and Sherrie Mustache  Time spent on call:  11 mins  History of Present Illness: Due to ongoing UTI symptoms.  Pt reports he took Augmentin 875-125 mg by mouth twice daily.  States there was another medication he should have been taking but was not given anything else.  It was recommended that he take florastor to avoid GI upset but he denies every having issues with this.  Continues to have nasal congestion under his eyes. Sore. Sneezing and coughing still. Feels like allergies.  He is taking zyrtec daily and has been for several years. Has allergies. Using mucinex relief.  No chest congestion.  No fever Feels stopped up but not sick. No decrease in energy.  Blowing nose a lot Post nasal drip.  No sore throat.   Reports he has tried the Ambien 5 mg has not been effective.  Also taking trazodone 100 mg by mouth at bedtime with melatonin 10 mg by mouth daily.  States that he is not staying asleep and effecting  him  Observations/Objective: No fever. 98.9   Assessment and Plan: 1. Seasonal allergies Ongoing, encouraged to change from zyrtec to Claritin 10 mg by mouth daily  - fluticasone (FLONASE) 50 MCG/ACT nasal spray; Place 1 spray into both nostrils 2 (two) times daily.  Dispense: 16 g; Refill: 6 -to use netty pot daily or nasal wash daily   2. Primary insomnia Ambien 5 mg not effective. Educated on the use of Ambien causing worsening memory loss, daytime sleepiness, increase risk of fall, etc. Wife reports she is aware of side effects but he is not able to sleep. Continues on trazodone and melatonin.  -will start belsomra 10 mg by mouth at bedtime for sleep.  - Suvorexant (BELSOMRA) 10 MG TABS; Take 10 mg by mouth at bedtime.  Dispense: 30 tablet; Refill: 1    Follow Up Instructions:    I discussed the assessment and treatment plan with the patient. The patient was provided an opportunity to ask questions and all were answered. The patient agreed with the plan and demonstrated an understanding of the instructions.   The patient was advised to call back or seek an in-person evaluation if the symptoms worsen or if the condition fails to improve as anticipated.  I provided 25 mins minutes of non-face-to-face time during this encounter.     Careteam: Patient Care Team: Lauree Chandler, NP as PCP - General (Geriatric Medicine) Josue Hector, MD as PCP - Cardiology (Cardiology) Clent Jacks, MD as Consulting Physician (Ophthalmology) Lamonte Sakai,  Rose Fillers, MD as Consulting Physician (Pulmonary Disease) Angelia Mould, MD as Consulting Physician (Vascular Surgery) Earlie Server, MD as Consulting Physician (Orthopedic Surgery) Wilford Corner, MD as Consulting Physician (Gastroenterology) Chesley Mires, MD as Consulting Physician (Pulmonary Disease)  Advanced Directive information    Allergies  Allergen Reactions  . Iodine Swelling    Internal iodine  . Iohexol Hives          Chief Complaint  Patient presents with  . Acute Visit    Patient states he is still congested, sneezing, and coughing. Medication previously prescribed uneffective   . Medication Management    patient would like to discuss 10 mg of ambien      Past Medical History:  Diagnosis Date  . Actinic keratosis   . Alzheimer's disease ([place])   . Anal fissure   . Anxiety state, unspecified   . Benign neoplasm of colon   . BENIGN PROSTATIC HYPERTROPHY, HX OF   . BPH (benign prostatic hypertrophy) with urinary obstruction 06/22/2008   Qualifier: Diagnosis of  By: Johnsie Cancel, MD, Rona Ravens   . Cerebrovascular disease, unspecified   . Coronary atherosclerosis of native coronary artery   . Depressive disorder, not elsewhere classified   . Diverticulosis of colon (without mention of hemorrhage)   . Essential hypertension, benign   . External hemorrhoids without mention of complication   . Insomnia, unspecified   . Mixed hyperlipidemia   . Osteoarthrosis, unspecified whether generalized or localized, unspecified site   . Other premature beats   . Other psoriasis   . Pain in joint, lower leg   . Pain in joint, site unspecified   . Palpitations   . Pressure ulcer   . Reflux esophagitis   . Unspecified essential hypertension   . Unspecified hereditary and idiopathic peripheral neuropathy   . Unspecified hypothyroidism   . Unspecified vitamin D deficiency    Past Surgical History:  Procedure Laterality Date  . CERVICAL DISC SURGERY    . EYE SURGERY Left 02/07/2014   Cataract Surgery Left Eye  . KNEE SURGERY  07/18/2006   Dr French Ana   . LAMINECTOMY C3 disc  1972  . LEFT HEART CATHETERIZATION WITH CORONARY ANGIOGRAM N/A 01/28/2013   Procedure: LEFT HEART CATHETERIZATION WITH CORONARY ANGIOGRAM;  Surgeon: Josue Hector, MD;  Location: [facility] CATH LAB;  Service: Cardiovascular;  Laterality: N/A;  . MULTIPLE TOOTH EXTRACTIONS  2014  . RETINAL TEAR REPAIR CRYOTHERAPY     Dr Wayne Sever     Social History:   reports that he quit smoking about 26 years ago. His smoking use included cigarettes. He has a 50.00 pack-year smoking history. He has never used smokeless tobacco. He reports current alcohol use. He reports that he does not use drugs.  Family History  Problem Relation Age of Onset  . Cancer Father        pancreatic  . Cancer Mother        pancreatic    Medications: Patient's Medications  New Prescriptions   FLUTICASONE (FLONASE) 50 MCG/ACT NASAL SPRAY    Place 1 spray into both nostrils 2 (two) times daily.   SUVOREXANT (BELSOMRA) 10 MG TABS    Take 10 mg by mouth at bedtime.  Previous Medications   ALPRAZOLAM (XANAX) 0.5 MG TABLET    Take 1 tablet (0.5 mg total) by mouth 2 (two) times daily as needed.   ASPIRIN EC 81 MG TABLET    Take 81 mg by mouth at bedtime.   B  COMPLEX VITAMINS TABLET    Take 1 tablet by mouth daily.   CARTIA XT 240 MG 24 HR CAPSULE    TAKE 1 CAPSULE BY MOUTH ONCE DAILY   CETIRIZINE (ZYRTEC) 10 MG TABLET    Take 1 tablet (10 mg total) by mouth daily.   CHOLECALCIFEROL (VITAMIN D) 1000 UNITS TABLET    Take 1,000 Units by mouth daily.   GALANTAMINE (RAZADYNE ER) 24 MG 24 HR CAPSULE    Take one capsule by mouth once daily with breakfast   ISOSORBIDE MONONITRATE (IMDUR) 30 MG 24 HR TABLET    TAKE 1 TABLET BY MOUTH ONCE DAILY   LEVOTHYROXINE (SYNTHROID, LEVOTHROID) 88 MCG TABLET    Take one tablet by mouth once daily on empty stomach 30 minutes before breakfast   LOVASTATIN (MEVACOR) 40 MG TABLET    TAKE 1 TABLET BY MOUTH ONCE DAILY AT BEDTIME AS NEEDED FOR CHOLESTEROL   MAGNESIUM PO    Take by mouth daily.   MEMANTINE (NAMENDA) 10 MG TABLET    Take 1 tablet (10 mg total) by mouth 2 (two) times daily.   METRONIDAZOLE (METROCREAM) 0.75 % CREAM    Apply 8.11 application topically 2 (two) times daily.   MIRABEGRON ER (MYRBETRIQ) 50 MG TB24 TABLET    Take 1 tablet (50 mg total) by mouth every other day.   MULTIPLE VITAMIN (MULTIVITAMIN WITH MINERALS)  TABS TABLET    Take 1 tablet by mouth daily.   NITROGLYCERIN (NITROSTAT) 0.4 MG SL TABLET    One under the tongue if needed for chest tightness. Repeat in 5 minutes if needed. Repeat again in 5 minutes if needed.   SERTRALINE (ZOLOFT) 50 MG TABLET    Take 1 tablet (50 mg total) by mouth daily.   TAMSULOSIN (FLOMAX) 0.4 MG CAPS CAPSULE    TAKE ONE CAPSULE BY MOUTH ONCE DAILY TO HELP RELIEVE OBSTRUCTION   TRAZODONE (DESYREL) 100 MG TABLET    TAKE 1 TABLET BY MOUTH EVERY DAY AT BEDTIME TO HELP REST   ZINC GLUCONATE 50 MG TABLET    Take 50 mg by mouth at bedtime.   ZOLPIDEM (AMBIEN) 5 MG TABLET    Take 1 tablet (5 mg total) by mouth at bedtime.  Modified Medications   No medications on file  Discontinued Medications   No medications on file     Labs reviewed: Basic Metabolic Panel: Recent Labs    07/22/17 1348 06/09/18 0911  Larson  --  140  K  --  4.5  CL  --  107  CO2  --  29  GLUCOSE  --  83  BUN  --  15  CREATININE  --  1.34*  CALCIUM  --  9.3  TSH 2.80 3.76   Liver Function Tests: Recent Labs    06/09/18 0911  AST 25  ALT 25  BILITOT 0.5  PROT 6.1   No results for input(s): LIPASE, AMYLASE in the last 8760 hours. No results for input(s): AMMONIA in the last 8760 hours. CBC: Recent Labs    06/09/18 0911  WBC 5.0  NEUTROABS 2,775  HGB 14.8  HCT 43.4  MCV 96.7  PLT 222   Lipid Panel: Recent Labs    06/09/18 0911  CHOL 172  HDL 57  LDLCALC 84  TRIG 222*  CHOLHDL 3.0   TSH: Recent Labs    07/22/17 1348 06/09/18 0911  TSH 2.80 3.76   A1C: No results found for: HGBA1C  Ellana Larson K. Phillip Larson  Phillip Larson [phone]   avs printed.

## 2018-07-21 ENCOUNTER — Other Ambulatory Visit: Payer: Self-pay | Admitting: Nurse Practitioner

## 2018-07-21 DIAGNOSIS — F5101 Primary insomnia: Secondary | ICD-10-CM

## 2018-07-21 NOTE — Telephone Encounter (Signed)
Last filled 06/18/2018  [place] Database verified and compliance confirmed

## 2018-08-04 ENCOUNTER — Other Ambulatory Visit: Payer: Self-pay | Admitting: *Deleted

## 2018-08-04 DIAGNOSIS — F5104 Psychophysiologic insomnia: Secondary | ICD-10-CM

## 2018-08-04 MED ORDER — TRAZODONE HCL 100 MG PO TABS: 100.0000 mg | ORAL_TABLET | Freq: Every day | ORAL | 1 refills | Status: DC

## 2018-08-11 DIAGNOSIS — J300 Vasomotor rhinitis: Secondary | ICD-10-CM | POA: Diagnosis not present

## 2018-08-16 ENCOUNTER — Encounter: Payer: Self-pay | Admitting: Nurse Practitioner

## 2018-08-28 ENCOUNTER — Other Ambulatory Visit: Payer: Self-pay | Admitting: Nurse Practitioner

## 2018-08-28 DIAGNOSIS — F5101 Primary insomnia: Secondary | ICD-10-CM

## 2018-08-28 NOTE — Telephone Encounter (Signed)
Last filled 07/21/2018  [place] Database verified and compliance confirmed

## 2018-09-07 DIAGNOSIS — J310 Chronic rhinitis: Secondary | ICD-10-CM | POA: Diagnosis not present

## 2018-09-29 ENCOUNTER — Other Ambulatory Visit: Payer: Self-pay | Admitting: Nurse Practitioner

## 2018-09-29 DIAGNOSIS — F5101 Primary insomnia: Secondary | ICD-10-CM

## 2018-09-29 NOTE — Telephone Encounter (Signed)
Last filled 08/28/2018  [place] Database verified and compliance confirmed

## 2018-09-30 ENCOUNTER — Other Ambulatory Visit: Payer: Self-pay | Admitting: Nurse Practitioner

## 2018-09-30 DIAGNOSIS — F3342 Major depressive disorder, recurrent, in full remission: Secondary | ICD-10-CM

## 2018-11-03 ENCOUNTER — Other Ambulatory Visit: Payer: Self-pay | Admitting: Nurse Practitioner

## 2018-11-03 DIAGNOSIS — F5101 Primary insomnia: Secondary | ICD-10-CM

## 2018-11-03 NOTE — Telephone Encounter (Signed)
RX last filled 09/29/2018 per Epic, sent to Marlowe Sax, NP (covering for Lauree Chandler, NP) to confirm with [place] Database and to approve if necessary

## 2018-11-19 ENCOUNTER — Other Ambulatory Visit: Payer: Self-pay | Admitting: Cardiovascular Disease

## 2018-12-09 ENCOUNTER — Other Ambulatory Visit: Payer: Self-pay | Admitting: *Deleted

## 2018-12-09 MED ORDER — LOVASTATIN 40 MG PO TABS: ORAL_TABLET | ORAL | 1 refills | Status: DC

## 2018-12-09 NOTE — Telephone Encounter (Signed)
[facility] requested   Pended and sent to Janett Billow due to [place].

## 2018-12-10 ENCOUNTER — Ambulatory Visit (INDEPENDENT_AMBULATORY_CARE_PROVIDER_SITE_OTHER): Payer: Medicare Other | Admitting: Nurse Practitioner

## 2018-12-10 ENCOUNTER — Other Ambulatory Visit: Payer: Self-pay

## 2018-12-10 ENCOUNTER — Encounter: Payer: Self-pay | Admitting: Nurse Practitioner

## 2018-12-10 VITALS — BP 148/80 | HR 70 | Temp 98.5°F | Ht 70.0 in | Wt 183.0 lb

## 2018-12-10 DIAGNOSIS — F5101 Primary insomnia: Secondary | ICD-10-CM

## 2018-12-10 DIAGNOSIS — G301 Alzheimer's disease with late onset: Secondary | ICD-10-CM | POA: Diagnosis not present

## 2018-12-10 DIAGNOSIS — Z23 Encounter for immunization: Secondary | ICD-10-CM | POA: Diagnosis not present

## 2018-12-10 DIAGNOSIS — N3281 Overactive bladder: Secondary | ICD-10-CM

## 2018-12-10 DIAGNOSIS — E782 Mixed hyperlipidemia: Secondary | ICD-10-CM

## 2018-12-10 DIAGNOSIS — M79661 Pain in right lower leg: Secondary | ICD-10-CM

## 2018-12-10 DIAGNOSIS — E039 Hypothyroidism, unspecified: Secondary | ICD-10-CM

## 2018-12-10 DIAGNOSIS — N183 Chronic kidney disease, stage 3 unspecified: Secondary | ICD-10-CM

## 2018-12-10 DIAGNOSIS — M79662 Pain in left lower leg: Secondary | ICD-10-CM

## 2018-12-10 DIAGNOSIS — I10 Essential (primary) hypertension: Secondary | ICD-10-CM | POA: Diagnosis not present

## 2018-12-10 DIAGNOSIS — F0280 Dementia in other diseases classified elsewhere without behavioral disturbance: Secondary | ICD-10-CM

## 2018-12-10 MED ORDER — ZOLPIDEM TARTRATE 5 MG PO TABS: 5.0000 mg | ORAL_TABLET | Freq: Every evening | ORAL | 0 refills | Status: DC | PRN

## 2018-12-10 MED ORDER — LOVASTATIN 40 MG PO TABS: 40.0000 mg | ORAL_TABLET | Freq: Every day | ORAL | 1 refills | Status: DC

## 2018-12-10 MED ORDER — ALPRAZOLAM 0.5 MG PO TABS: 0.5000 mg | ORAL_TABLET | Freq: Every day | ORAL | 0 refills | Status: DC | PRN

## 2018-12-10 MED ORDER — GALANTAMINE HYDROBROMIDE ER 24 MG PO CP24: ORAL_CAPSULE | ORAL | 1 refills | Status: DC

## 2018-12-10 NOTE — Progress Notes (Signed)
Careteam: Patient Care Team: Lauree Chandler, NP as PCP - General (Geriatric Medicine) Josue Hector, MD as PCP - Cardiology (Cardiology) Clent Jacks, MD as Consulting Physician (Ophthalmology) Lamonte Sakai Rose Fillers, MD as Consulting Physician (Pulmonary Disease) Angelia Mould, MD as Consulting Physician (Vascular Surgery) Earlie Server, MD as Consulting Physician (Orthopedic Surgery) Wilford Corner, MD as Consulting Physician (Gastroenterology) Chesley Mires, MD as Consulting Physician (Pulmonary Disease)  Advanced Directive information Does Patient Have a Medical Advance Directive?: Yes, Type of Advance Directive: [place];Living will, Does patient want to make changes to medical advance directive?: No - Patient declined  Allergies  Allergen Reactions  . Iodine Swelling    Internal iodine  . Iohexol Hives         Chief Complaint  Patient presents with  . Medical Management of Chronic Issues    6 month follow-up   . Immunizations    Flu Vaccine      HPI: Patient is a 84 y.o. male seen in the office today for routine follow up.  Been playing tennis for years, last summer calve ache (fatigue) during tennis. Stopped playing for a while then fell twice since he restarted so he stopped again. No swelling, redness. Tender when he touches.does not stay hydrated enough.  HTN/angina - stable on cardizem CD, imdur and prn SL NTG. He takes ASA daily for anticoagulation. Followed by cardio Dr Johnsie Cancel  Hypothyroidism - stable on levothyroxine. TSH2.8  Insomnia - took belsoma for a few months, did not work.  Started xanax and has to take another dose in the middle of the night. Snores a lot at night. Was evaluated years ago for sleep apnea. Also uses aleve for sleep.   Anxiety/recurrent MDD - Mood stable on sertraline and trazodone  BPH with lower urinary tract sx's - continues to have urinary frequency, on myrbetriq and flomax; followed by  urology.   Hyperlipidemia - stable on lovastatin. LDL 84, triglycerides 222 on labs from feb.  Alzheimer's dementia - stable on razadyne ER and namenda. MMSE 25//30  Allergic rhinitis - stable on cetirizine  CKD - stage 3. Does not drink a lot of water. Taking aleve at night  Will have 2 drinks occasionally. A couple of glasses of wine or a couple beers.   Review of Systems: Review of Systems  Constitutional: Negative for chills, fever and weight loss.  HENT: Negative for tinnitus.   Respiratory: Negative for cough, sputum production and shortness of breath.   Cardiovascular: Negative for chest pain, palpitations and leg swelling.  Gastrointestinal: Negative for abdominal pain, constipation, diarrhea and heartburn.  Genitourinary: Negative for dysuria, frequency and urgency.  Musculoskeletal: Positive for myalgias (in calves with activity). Negative for back pain, falls and joint pain.  Skin: Negative.   Neurological: Negative for dizziness, weakness and headaches.  Psychiatric/Behavioral: Negative for depression and memory loss. The patient has insomnia.     Past Medical History:  Diagnosis Date  . Actinic keratosis   . Alzheimer's disease ([place])   . Anal fissure   . Anxiety state, unspecified   . Benign neoplasm of colon   . BENIGN PROSTATIC HYPERTROPHY, HX OF   . BPH (benign prostatic hypertrophy) with urinary obstruction 06/22/2008   Qualifier: Diagnosis of  By: Johnsie Cancel, MD, Rona Ravens   . Cerebrovascular disease, unspecified   . Coronary atherosclerosis of native coronary artery   . Depressive disorder, not elsewhere classified   . Diverticulosis of colon (without mention of hemorrhage)   .  Essential hypertension, benign   . External hemorrhoids without mention of complication   . Insomnia, unspecified   . Mixed hyperlipidemia   . Osteoarthrosis, unspecified whether generalized or localized, unspecified site   . Other premature beats   . Other psoriasis    . Pain in joint, lower leg   . Pain in joint, site unspecified   . Palpitations   . Pressure ulcer   . Reflux esophagitis   . Unspecified essential hypertension   . Unspecified hereditary and idiopathic peripheral neuropathy   . Unspecified hypothyroidism   . Unspecified vitamin D deficiency    Past Surgical History:  Procedure Laterality Date  . CERVICAL DISC SURGERY    . EYE SURGERY Left 02/07/2014   Cataract Surgery Left Eye  . KNEE SURGERY  07/18/2006   Dr French Ana   . LAMINECTOMY C3 disc  1972  . LEFT HEART CATHETERIZATION WITH CORONARY ANGIOGRAM N/A 01/28/2013   Procedure: LEFT HEART CATHETERIZATION WITH CORONARY ANGIOGRAM;  Surgeon: Josue Hector, MD;  Location: [facility] CATH LAB;  Service: Cardiovascular;  Laterality: N/A;  . MULTIPLE TOOTH EXTRACTIONS  2014  . RETINAL TEAR REPAIR CRYOTHERAPY     Dr Wayne Sever    Social History:   reports that he quit smoking about 33 years ago. His smoking use included cigarettes. He has a 50.00 pack-year smoking history. He has never used smokeless tobacco. He reports current alcohol use. He reports that he does not use drugs.  Family History  Problem Relation Age of Onset  . Cancer Father        pancreatic  . Cancer Mother        pancreatic    Medications: Patient's Medications  New Prescriptions   ZOLPIDEM (AMBIEN) 5 MG TABLET    Take 1 tablet (5 mg total) by mouth at bedtime as needed for sleep.  Previous Medications   ASPIRIN EC 81 MG TABLET    Take 81 mg by mouth at bedtime.   B COMPLEX VITAMINS TABLET    Take 1 tablet by mouth daily.   CARTIA XT 240 MG 24 HR CAPSULE    TAKE 1 CAPSULE BY MOUTH ONCE DAILY   CETIRIZINE (ZYRTEC) 10 MG TABLET    Take 1 tablet (10 mg total) by mouth daily.   CHOLECALCIFEROL (VITAMIN D) 1000 UNITS TABLET    Take 1,000 Units by mouth daily.   FLUTICASONE (FLONASE) 50 MCG/ACT NASAL SPRAY    Place 1 spray into both nostrils 2 (two) times daily as needed for allergies or rhinitis.   ISOSORBIDE MONONITRATE  (IMDUR) 30 MG 24 HR TABLET    Take 15 mg by mouth daily.   LEVOTHYROXINE (SYNTHROID, LEVOTHROID) 88 MCG TABLET    Take one tablet by mouth once daily on empty stomach 30 minutes before breakfast   MEMANTINE (NAMENDA) 10 MG TABLET    Take 1 tablet (10 mg total) by mouth 2 (two) times daily.   MIRABEGRON ER (MYRBETRIQ) 50 MG TB24 TABLET    Take 1 tablet (50 mg total) by mouth every other day.   MULTIPLE VITAMIN (MULTIVITAMIN WITH MINERALS) TABS TABLET    Take 1 tablet by mouth daily.   NITROGLYCERIN (NITROSTAT) 0.4 MG SL TABLET    One under the tongue if needed for chest tightness. Repeat in 5 minutes if needed. Repeat again in 5 minutes if needed.   SERTRALINE (ZOLOFT) 50 MG TABLET    Take 1 tablet by mouth once daily   TAMSULOSIN (FLOMAX) 0.4 MG CAPS CAPSULE  TAKE ONE CAPSULE BY MOUTH ONCE DAILY TO HELP RELIEVE OBSTRUCTION   TRAZODONE (DESYREL) 100 MG TABLET    Take 1 tablet (100 mg total) by mouth at bedtime.   VITAMIN C (ASCORBIC ACID) 500 MG TABLET    Take 500 mg by mouth daily.   ZINC GLUCONATE 50 MG TABLET    Take 50 mg by mouth at bedtime.  Modified Medications   Modified Medication Previous Medication   ALPRAZOLAM (XANAX) 0.5 MG TABLET ALPRAZolam (XANAX) 0.5 MG tablet      Take 1 tablet (0.5 mg total) by mouth daily as needed.    Take 1 tablet by mouth twice daily as needed   GALANTAMINE (RAZADYNE ER) 24 MG 24 HR CAPSULE galantamine (RAZADYNE ER) 24 MG 24 hr capsule      Take one capsule by mouth once daily with breakfast    Take one capsule by mouth once daily with breakfast   LOVASTATIN (MEVACOR) 40 MG TABLET lovastatin (MEVACOR) 40 MG tablet      Take 1 tablet (40 mg total) by mouth at bedtime.    Take 40 mg by mouth at bedtime.  Discontinued Medications   FLUTICASONE (FLONASE) 50 MCG/ACT NASAL SPRAY    Place 1 spray into both nostrils 2 (two) times daily.   ISOSORBIDE MONONITRATE (IMDUR) 30 MG 24 HR TABLET    Take 1 tablet (30 mg total) by mouth daily. Please make yearly appt with  Dr. Johnsie Cancel for December for future refills. 1st attempt   LOVASTATIN (MEVACOR) 40 MG TABLET    Take one tablet by mouth once daily at bedtime as needed for cholesterol   MAGNESIUM PO    Take by mouth daily.   METRONIDAZOLE (METROCREAM) 0.75 % CREAM    Apply 2.24 application topically 2 (two) times daily.   SUVOREXANT (BELSOMRA) 10 MG TABS    Take 10 mg by mouth at bedtime.   ZOLPIDEM (AMBIEN) 5 MG TABLET    Take 1 tablet (5 mg total) by mouth at bedtime.    Physical Exam:  Vitals:   12/10/18 1537  BP: (!) 148/80  Pulse: 70  Temp: 98.5 F (36.9 C)  TempSrc: Oral  SpO2: 96%  Weight: 183 lb (83 kg)  Height: '5\' 10"'  (1.778 m)   Body mass index is 26.26 kg/m. Wt Readings from Last 3 Encounters:  12/10/18 183 lb (83 kg)  07/03/18 183 lb (83 kg)  06/11/18 184 lb (83.5 kg)    Physical Exam Constitutional:      Appearance: He is well-developed.  HENT:     Head: Normocephalic and atraumatic.     Right Ear: Tympanic membrane and external ear normal.     Left Ear: Tympanic membrane and external ear normal.     Nose: Rhinorrhea present.     Mouth/Throat:     Pharynx: No oropharyngeal exudate.  Eyes:     Conjunctiva/sclera: Conjunctivae normal.     Pupils: Pupils are equal, round, and reactive to light.  Neck:     Musculoskeletal: Neck supple.  Cardiovascular:     Rate and Rhythm: Normal rate and regular rhythm.     Heart sounds: Murmur present.  Pulmonary:     Effort: Pulmonary effort is normal. No respiratory distress.     Breath sounds: Normal breath sounds. No stridor.  Musculoskeletal:        General: No deformity.  Lymphadenopathy:     Cervical: No cervical adenopathy.  Neurological:     Mental Status: He is alert  and oriented to person, place, and time.     Labs reviewed: Basic Metabolic Panel: Recent Labs    06/09/18 0911  NA 140  K 4.5  CL 107  CO2 29  GLUCOSE 83  BUN 15  CREATININE 1.34*  CALCIUM 9.3  TSH 3.76   Liver Function Tests: Recent Labs     06/09/18 0911  AST 25  ALT 25  BILITOT 0.5  PROT 6.1   No results for input(s): LIPASE, AMYLASE in the last 8760 hours. No results for input(s): AMMONIA in the last 8760 hours. CBC: Recent Labs    06/09/18 0911  WBC 5.0  NEUTROABS 2,775  HGB 14.8  HCT 43.4  MCV 96.7  PLT 222   Lipid Panel: Recent Labs    06/09/18 0911  CHOL 172  HDL 57  LDLCALC 84  TRIG 222*  CHOLHDL 3.0   TSH: Recent Labs    06/09/18 0911  TSH 3.76   A1C: No results found for: HGBA1C   Assessment/Plan 1. Primary insomnia -wife continues to be upset that pt can not take ambien 10 mg daily for sleep. Again explained the adverse effects of medication. Also discussed taking medication for sleep such as Ambien, xanax and alcohol can lead to respiratory depression and death. Educated not to drink alcohol while taking these medications.  - zolpidem (AMBIEN) 5 MG tablet; Take 1 tablet (5 mg total) by mouth at bedtime as needed for sleep.  Dispense: 30 tablet; Refill: 0 - ALPRAZolam (XANAX) 0.5 MG tablet; Take 1 tablet (0.5 mg total) by mouth daily as needed for anxiety. Dispense: 30 tablet; Refill: 0  2. Late onset Alzheimer's disease without behavioral disturbance (HCC) Stable, no major changes noted to memory at this time. Wife helps with medication.  - galantamine (RAZADYNE ER) 24 MG 24 hr capsule; Take one capsule by mouth once daily with breakfast  Dispense: 90 capsule; Refill: 1  3. Essential hypertension, benign -stable, reports blood pressure generally controlled. Dash diet encouraged. Goal bp is <140/90 to notify if remains elevated. - CBC with Differential/Platelet; Future  4. Mixed hyperlipidemia -continue lovastatin with dietary modifications - CMP with eGFR([facility]); Future - Lipid Panel; Future - lovastatin (MEVACOR) 40 MG tablet; Take 1 tablet (40 mg total) by mouth at bedtime.  Dispense: 90 tablet; Refill: 1  5. Hypothyroidism, unspecified type TSH well controlled on last labs,  continues on synthroid 88 mcg   6. Overactive bladder Ongoing, encouraged to follow up with urologist at this time as they have been managing bladder and BPH  7. Stage 3 chronic kidney disease ([place]) -reinforced not to use NSAID as this can worsen renal disease. Encouraged proper - CMP with eGFR([facility]); Future  8. Bilateral calf pain -increase in calf pain with activity such as playing tennis but improved with rest - VAS [place] ABI WITH/WO TBI; Future for further evaluation at this time. Continues on asa daily   9. Need for influenza vaccination - Flu Vaccine QUAD High Dose(Fluad)  Next appt: 6 months for routine follow up. Carlos American. Nixon, [facility] [phone]

## 2018-12-10 NOTE — Patient Instructions (Addendum)
CKD- Encourage proper hydration and to avoid NSAIDS (Aleve, Advil, Motrin, Ibuprofen)  To avoid alcohol when taking xanax and Ambien.  To only use xanax as needed for increase in anxiety or panic   Follow up fasting blood work when able

## 2018-12-11 ENCOUNTER — Other Ambulatory Visit: Payer: Medicare Other

## 2018-12-11 DIAGNOSIS — E782 Mixed hyperlipidemia: Secondary | ICD-10-CM | POA: Diagnosis not present

## 2018-12-11 DIAGNOSIS — I10 Essential (primary) hypertension: Secondary | ICD-10-CM

## 2018-12-11 DIAGNOSIS — N183 Chronic kidney disease, stage 3 unspecified: Secondary | ICD-10-CM

## 2018-12-12 LAB — CBC WITH DIFFERENTIAL/PLATELET
Absolute Monocytes: 323 cells/uL (ref 200–950)
Basophils Absolute: 21 cells/uL (ref 0–200)
Basophils Relative: 0.4 %
Eosinophils Absolute: 281 cells/uL (ref 15–500)
Eosinophils Relative: 5.3 %
HCT: 44.1 % (ref 38.5–50.0)
Hemoglobin: 15.2 g/dL (ref 13.2–17.1)
Lymphs Abs: 1267 cells/uL (ref 850–3900)
MCH: 33.3 pg — ABNORMAL HIGH (ref 27.0–33.0)
MCHC: 34.5 g/dL (ref 32.0–36.0)
MCV: 96.7 fL (ref 80.0–100.0)
MPV: 11.2 fL (ref 7.5–12.5)
Monocytes Relative: 6.1 %
Neutro Abs: 3408 cells/uL (ref 1500–7800)
Neutrophils Relative %: 64.3 %
Platelets: 196 10*3/uL (ref 140–400)
RBC: 4.56 10*6/uL (ref 4.20–5.80)
RDW: 12.2 % (ref 11.0–15.0)
Total Lymphocyte: 23.9 %
WBC: 5.3 10*3/uL (ref 3.8–10.8)

## 2018-12-12 LAB — COMPLETE METABOLIC PANEL WITH GFR
AG Ratio: 2.2 (calc) (ref 1.0–2.5)
ALT: 35 U/L (ref 9–46)
AST: 32 U/L (ref 10–35)
Albumin: 4.1 g/dL (ref 3.6–5.1)
Alkaline phosphatase (APISO): 101 U/L (ref 35–144)
BUN/Creatinine Ratio: 8 (calc) (ref 6–22)
BUN: 11 mg/dL (ref 7–25)
CO2: 25 mmol/L (ref 20–32)
Calcium: 9.9 mg/dL (ref 8.6–10.3)
Chloride: 107 mmol/L (ref 98–110)
Creat: 1.3 mg/dL — ABNORMAL HIGH (ref 0.70–1.11)
GFR, Est African American: 59 mL/min/{1.73_m2} — ABNORMAL LOW (ref >=60)
GFR, Est Non African American: 51 mL/min/{1.73_m2} — ABNORMAL LOW (ref >=60)
Globulin: 1.9 g/dL (calc) (ref 1.9–3.7)
Glucose, Bld: 92 mg/dL (ref 65–99)
Potassium: 4.1 mmol/L (ref 3.5–5.3)
Sodium: 141 mmol/L (ref 135–146)
Total Bilirubin: 0.5 mg/dL (ref 0.2–1.2)
Total Protein: 6 g/dL — ABNORMAL LOW (ref 6.1–8.1)

## 2018-12-12 LAB — LIPID PANEL
Cholesterol: 153 mg/dL (ref <200)
HDL: 62 mg/dL (ref >=40)
LDL Cholesterol (Calc): 69 mg/dL (calc)
Non-HDL Cholesterol (Calc): 91 mg/dL (calc) (ref <130)
Total CHOL/HDL Ratio: 2.5 (calc) (ref <5.0)
Triglycerides: 136 mg/dL (ref <150)

## 2018-12-17 ENCOUNTER — Other Ambulatory Visit: Payer: Self-pay | Admitting: Nurse Practitioner

## 2018-12-17 ENCOUNTER — Other Ambulatory Visit: Payer: Self-pay

## 2018-12-17 ENCOUNTER — Ambulatory Visit (HOSPITAL_COMMUNITY)
Admission: RE | Admit: 2018-12-17 | Discharge: 2018-12-17 | Disposition: A | Discharge status: Home / Self Care | Payer: Medicare Other | Source: Ambulatory Visit | Attending: Family | Admitting: Family

## 2018-12-17 DIAGNOSIS — M79662 Pain in left lower leg: Secondary | ICD-10-CM | POA: Insufficient documentation

## 2018-12-17 DIAGNOSIS — M79661 Pain in right lower leg: Secondary | ICD-10-CM | POA: Diagnosis not present

## 2018-12-17 DIAGNOSIS — I739 Peripheral vascular disease, unspecified: Secondary | ICD-10-CM

## 2018-12-21 ENCOUNTER — Telehealth: Payer: Self-pay | Admitting: *Deleted

## 2018-12-21 NOTE — Telephone Encounter (Signed)
Forwarded to Kathyrn Lass, Referral

## 2018-12-21 NOTE — Telephone Encounter (Signed)
I placed a referral for vein [facility] vascular, can you follow up with lisa on this?

## 2018-12-21 NOTE — Telephone Encounter (Signed)
Patient called and stated that he went to [facility] for his Doppler. Patient thought that the [facility] was to follow up after the doppler but they told him they were only to do the testing. Patient is wanting to know who is going to follow up with this and Treat him. Please Advise.

## 2018-12-22 ENCOUNTER — Other Ambulatory Visit: Payer: Self-pay | Admitting: Nurse Practitioner

## 2018-12-31 ENCOUNTER — Other Ambulatory Visit: Payer: Self-pay | Admitting: Nurse Practitioner

## 2018-12-31 DIAGNOSIS — F3342 Major depressive disorder, recurrent, in full remission: Secondary | ICD-10-CM

## 2018-12-31 NOTE — Telephone Encounter (Signed)
Routing to provider for approval. Contraindication / allergy warning indicated.

## 2019-01-06 ENCOUNTER — Other Ambulatory Visit: Payer: Self-pay | Admitting: Nurse Practitioner

## 2019-01-06 DIAGNOSIS — F5101 Primary insomnia: Secondary | ICD-10-CM

## 2019-01-06 NOTE — Telephone Encounter (Signed)
Verified patient's last refill in [facility] database Alprazolam 0.5 last refill 12/10/18 and Zolpidem 5 mg tab last refill 12/10/18. Last appointment 12/10/18 with provider and next appointment is 06/17/19.

## 2019-01-08 NOTE — Telephone Encounter (Signed)
Patient wife called requesting refills.  Pended and sent to [facility] for approval.

## 2019-02-02 ENCOUNTER — Other Ambulatory Visit: Payer: Self-pay | Admitting: Nurse Practitioner

## 2019-02-02 DIAGNOSIS — F0280 Dementia in other diseases classified elsewhere without behavioral disturbance: Secondary | ICD-10-CM

## 2019-02-03 ENCOUNTER — Other Ambulatory Visit: Payer: Self-pay

## 2019-02-03 ENCOUNTER — Ambulatory Visit (INDEPENDENT_AMBULATORY_CARE_PROVIDER_SITE_OTHER): Payer: Medicare Other | Admitting: Vascular Surgery

## 2019-02-03 ENCOUNTER — Encounter: Payer: Self-pay | Admitting: Vascular Surgery

## 2019-02-03 VITALS — BP 101/60 | HR 67 | Temp 97.8°F | Resp 20 | Ht 70.0 in | Wt 182.5 lb

## 2019-02-03 DIAGNOSIS — I739 Peripheral vascular disease, unspecified: Secondary | ICD-10-CM

## 2019-02-03 NOTE — Progress Notes (Signed)
REASON FOR CONSULT:    Abnormal ABIs.  This was an internal referral.  ASSESSMENT & PLAN:   PERIPHERAL VASCULAR DISEASE: Based on his history and exam he has evidence of superficial femoral artery occlusive disease on the right.  I have discussed with him the importance of getting on a structured walking program.  Fortunately he is not a smoker.  We also discussed the importance of nutrition.  I explained that I would not recommend an aggressive approach to his peripheral vascular disease unless he developed disabling claudication, rest pain, or nonhealing ulcer.  I will be happy to see him back at any time if his symptoms progress.  Deitra Mayo, MD, FACS Beeper [phone] Office: [phone]   HPI:   Phillip Larson is a pleasant 84 y.o. male, who presents with a one-year history of right calf claudication.  He played tennis up until about a year ago and at that point he quit because he was starting to have some right calf claudication.  Their symptoms are brought on by ambulation and relieved with rest.  He has no symptoms in the left leg.  He denies any hip or thigh claudication.  His symptoms occur at approximately 3 blocks.  His symptoms have been stable over the last year.  There are no other aggravating or alleviating factors.  His risk factors for peripheral vascular disease include hypertension and hypercholesterolemia.  He denies any history of diabetes, family history of premature cardiovascular disease or tobacco use.  Past Medical History:  Diagnosis Date  . Actinic keratosis   . Alzheimer's disease ([place])   . Anal fissure   . Anxiety state, unspecified   . Benign neoplasm of colon   . BENIGN PROSTATIC HYPERTROPHY, HX OF   . BPH (benign prostatic hypertrophy) with urinary obstruction 06/22/2008   Qualifier: Diagnosis of  By: Johnsie Cancel, MD, Rona Ravens   . Cerebrovascular disease, unspecified   . Coronary atherosclerosis of native coronary artery   . Depressive  disorder, not elsewhere classified   . Diverticulosis of colon (without mention of hemorrhage)   . Essential hypertension, benign   . External hemorrhoids without mention of complication   . Insomnia, unspecified   . Mixed hyperlipidemia   . Osteoarthrosis, unspecified whether generalized or localized, unspecified site   . Other premature beats   . Other psoriasis   . Pain in joint, lower leg   . Pain in joint, site unspecified   . Palpitations   . Pressure ulcer   . Reflux esophagitis   . Unspecified essential hypertension   . Unspecified hereditary and idiopathic peripheral neuropathy   . Unspecified hypothyroidism   . Unspecified vitamin D deficiency     Family History  Problem Relation Age of Onset  . Cancer Father        pancreatic  . Cancer Mother        pancreatic    SOCIAL HISTORY: Social History   Socioeconomic History  . Marital status: Married    Spouse name: Not on file  . Number of children: Not on file  . Years of education: Not on file  . Highest education level: Not on file  Occupational History  . Occupation: retired  Scientific laboratory technician  . Financial resource strain: Not hard at all  . Food insecurity    Worry: Never true    Inability: Never true  . Transportation needs    Medical: No    Non-medical: No  Tobacco Use  . Smoking  status: Former Smoker    Packs/day: 2.00    Years: 25.00    Pack years: 50.00    Types: Cigarettes    Quit date: 04/22/1985    Years since quitting: 33.8  . Smokeless tobacco: Never Used  Substance and Sexual Activity  . Alcohol use: Yes    Alcohol/week: 0.0 standard drinks    Comment: beer/wine/liquor daily  . Drug use: No  . Sexual activity: Not Currently  Lifestyle  . Physical activity    Days per week: 2 days    Minutes per session: 90 min  . Stress: Not at all  Relationships  . Social connections    Talks on phone: Once a week    Gets together: Three times a week    Attends religious service: More than 4 times  per year    Active member of club or organization: No    Attends meetings of clubs or organizations: Never    Relationship status: Married  . Intimate partner violence    Fear of current or ex partner: No    Emotionally abused: No    Physically abused: No    Forced sexual activity: No  Other Topics Concern  . Not on file  Social History Narrative  . Not on file    Allergies  Allergen Reactions  . Iodine Swelling    Internal iodine  . Iohexol Hives         Current Outpatient Medications  Medication Sig Dispense Refill  . ALPRAZolam (XANAX) 0.5 MG tablet TAKE 1 TABLET BY MOUTH ONCE DAILY AS NEEDED 30 tablet 0  . aspirin EC 81 MG tablet Take 81 mg by mouth at bedtime.    [name] b complex vitamins tablet Take 1 tablet by mouth daily.    [name] CARTIA XT 240 MG 24 hr capsule TAKE 1 CAPSULE BY MOUTH ONCE DAILY 90 capsule 3  . cetirizine (ZYRTEC) 10 MG tablet Take 1 tablet (10 mg total) by mouth daily. 30 tablet 11  . cholecalciferol (VITAMIN D) 1000 UNITS tablet Take 1,000 Units by mouth daily.    . fluticasone (FLONASE) 50 MCG/ACT nasal spray Place 1 spray into both nostrils 2 (two) times daily as needed for allergies or rhinitis.    [name] galantamine (RAZADYNE ER) 24 MG 24 hr capsule Take one capsule by mouth once daily with breakfast 90 capsule 1  . isosorbide mononitrate (IMDUR) 30 MG 24 hr tablet Take 15 mg by mouth daily.    [name] levothyroxine (SYNTHROID) 88 MCG tablet TAKE 1 TABLET BY MOUTH ONCE DAILY ON AN EMPTY STOMACH 30 MINUTES BEFORE BREAKFAST 90 tablet 1  . lovastatin (MEVACOR) 40 MG tablet Take 1 tablet (40 mg total) by mouth at bedtime. 90 tablet 1  . memantine (NAMENDA) 10 MG tablet Take 1 tablet by mouth twice daily 60 tablet 5  . mirabegron ER (MYRBETRIQ) 50 MG TB24 tablet Take 1 tablet (50 mg total) by mouth every other day. 30 tablet 2  . Multiple Vitamin (MULTIVITAMIN WITH MINERALS) TABS tablet Take 1 tablet by mouth daily.    . nitroGLYCERIN (NITROSTAT) 0.4 MG SL tablet One  under the tongue if needed for chest tightness. Repeat in 5 minutes if needed. Repeat again in 5 minutes if needed. 25 tablet 12  . sertraline (ZOLOFT) 50 MG tablet Take 1 tablet by mouth once daily 90 tablet 1  . tamsulosin (FLOMAX) 0.4 MG CAPS capsule TAKE ONE CAPSULE BY MOUTH ONCE DAILY TO HELP RELIEVE OBSTRUCTION 90 capsule 1  .  traZODone (DESYREL) 100 MG tablet Take 1 tablet (100 mg total) by mouth at bedtime. 90 tablet 1  . vitamin C (ASCORBIC ACID) 500 MG tablet Take 500 mg by mouth daily.    [name] zinc gluconate 50 MG tablet Take 50 mg by mouth at bedtime.    [name] zolpidem (AMBIEN) 5 MG tablet TAKE 1 TABLET BY MOUTH AT BEDTIME FOR SLEEP 30 tablet 0   No current facility-administered medications for this visit.     REVIEW OF SYSTEMS:  [X]  denotes positive finding, [ ]  denotes negative finding Cardiac  Comments:  Chest pain or chest pressure:    Shortness of breath upon exertion: x   Short of breath when lying flat:    Irregular heart rhythm:        Vascular    Pain in calf, thigh, or hip brought on by ambulation: x   Pain in feet at night that wakes you up from your sleep:     Blood clot in your veins:    Leg swelling:         Pulmonary    Oxygen at home:    Productive cough:     Wheezing:         Neurologic    Sudden weakness in arms or legs:     Sudden numbness in arms or legs:     Sudden onset of difficulty speaking or slurred speech:    Temporary loss of vision in one eye:     Problems with dizziness:         Gastrointestinal    Blood in stool:     Vomited blood:         Genitourinary    Burning when urinating:     Blood in urine:        Psychiatric    Major depression:         Hematologic    Bleeding problems:    Problems with blood clotting too easily:        Skin    Rashes or ulcers:        Constitutional    Fever or chills:     PHYSICAL EXAM:   Vitals:   02/03/19 1000  BP: 101/60  Pulse: 67  Resp: 20  Temp: 97.8 F (36.6 C)  SpO2: 95%   Weight: 182 lb 8 oz (82.8 kg)  Height: 5\' 10"  (1.778 m)    GENERAL: The patient is a well-nourished male, in no acute distress. The vital signs are documented above. CARDIAC: There is a regular rate and rhythm.  VASCULAR: I do not detect carotid bruits. On the left side, he has a palpable femoral, popliteal, and posterior tibial pulse. On the right side, he has a palpable femoral pulse.  I cannot palpate a popliteal, dorsalis pedis, or posterior tibial pulse. Both feet are warm and well-perfused. He has no significant lower extremity swelling. PULMONARY: There is good air exchange bilaterally without wheezing or rales. ABDOMEN: Soft and non-tender with normal pitched bowel sounds.  MUSCULOSKELETAL: There are no major deformities or cyanosis. NEUROLOGIC: No focal weakness or paresthesias are detected. SKIN: There are no ulcers or rashes noted. PSYCHIATRIC: The patient has a normal affect.  DATA:    ARTERIAL DOPPLER STUDY: I have reviewed the arterial Doppler study that was done on 12/17/2018.  On the right side there is a biphasic dorsalis pedis and posterior tibial signal.  ABI is 59%.  Toe pressure is 71 mmHg.  On the left side  there is a biphasic dorsalis pedis signal and a triphasic posterior tibial signal.  ABI is 96%.  Toe pressures 147 mmHg.  LABS: I reviewed his labs from 12/11/2018.  GFR was 51.  LDL cholesterol 69.

## 2019-02-04 ENCOUNTER — Other Ambulatory Visit: Payer: Self-pay | Admitting: Nurse Practitioner

## 2019-02-04 DIAGNOSIS — F5101 Primary insomnia: Secondary | ICD-10-CM

## 2019-02-04 NOTE — Telephone Encounter (Signed)
Verified in [facility] database last refill 01/08/2019 for 30 day supply no refill. Last appointment 12/10/18 and next appointment with provider 06/17/19. Routing to provider for approval

## 2019-02-05 ENCOUNTER — Other Ambulatory Visit: Payer: Self-pay | Admitting: Nurse Practitioner

## 2019-02-05 ENCOUNTER — Other Ambulatory Visit: Payer: Self-pay | Admitting: *Deleted

## 2019-02-05 DIAGNOSIS — F5101 Primary insomnia: Secondary | ICD-10-CM

## 2019-02-05 MED ORDER — ZOLPIDEM TARTRATE 5 MG PO TABS: ORAL_TABLET | ORAL | 0 refills | Status: DC

## 2019-02-05 NOTE — Addendum Note (Signed)
Addended by: Logan Bores on: 02/05/2019 04:54 PM   Modules accepted: Orders

## 2019-02-05 NOTE — Telephone Encounter (Signed)
Patient wife, Vickii Chafe requested. Phoned to pharmacy.

## 2019-02-10 ENCOUNTER — Other Ambulatory Visit: Payer: Self-pay | Admitting: Nurse Practitioner

## 2019-02-10 DIAGNOSIS — F5101 Primary insomnia: Secondary | ICD-10-CM

## 2019-02-10 NOTE — Telephone Encounter (Signed)
Verified in [facility] data base last refill was written for 9/18 and dispensed on 9/22. Last appointment was 12/10/18 and next appointment is 06/17/19. Routing to provider for approval.

## 2019-02-18 ENCOUNTER — Other Ambulatory Visit: Payer: Self-pay | Admitting: Nurse Practitioner

## 2019-02-18 DIAGNOSIS — F5104 Psychophysiologic insomnia: Secondary | ICD-10-CM

## 2019-02-18 NOTE — Telephone Encounter (Signed)
High risk or very high risk warning populated when attempting to refill medication. RX request sent to PCP for review and approval if warranted.   

## 2019-03-02 ENCOUNTER — Other Ambulatory Visit: Payer: Self-pay | Admitting: Nurse Practitioner

## 2019-03-02 DIAGNOSIS — F5101 Primary insomnia: Secondary | ICD-10-CM

## 2019-03-02 NOTE — Telephone Encounter (Signed)
Verified medication in [facility] [place] database last refill was 02/05/2019 for ambien 5 mg tablet.  Patient next appointment is 06/17/19 and last appointment with provider was 12/11/2018 Routing to provider for approval.

## 2019-03-03 ENCOUNTER — Other Ambulatory Visit: Payer: Self-pay

## 2019-03-03 ENCOUNTER — Telehealth: Payer: Self-pay

## 2019-03-03 DIAGNOSIS — F5101 Primary insomnia: Secondary | ICD-10-CM

## 2019-03-03 NOTE — Telephone Encounter (Signed)
Called and spoke with patient and informed him that his script could not be refilled until Monday this was to soon for the refill

## 2019-03-03 NOTE — Telephone Encounter (Signed)
Patients wife Vickii Chafe called to request a refill for her husband's Ambien as she tried to do it her self and could not I informed her that this is a controlled substance and would have to be refilled by the provider I sent the request  to Sherrie Mustache NP his provider

## 2019-03-08 ENCOUNTER — Other Ambulatory Visit: Payer: Self-pay | Admitting: Nurse Practitioner

## 2019-03-08 DIAGNOSIS — F5101 Primary insomnia: Secondary | ICD-10-CM

## 2019-03-08 NOTE — Telephone Encounter (Signed)
Patient wife requested refill.

## 2019-03-11 ENCOUNTER — Other Ambulatory Visit: Payer: Self-pay | Admitting: Nurse Practitioner

## 2019-03-11 DIAGNOSIS — F5101 Primary insomnia: Secondary | ICD-10-CM

## 2019-03-12 NOTE — Telephone Encounter (Signed)
Last filled in [place] on 02/10/2019   Appointment scheduled for 04/02/2019 to updated non-opioid treatment agreement

## 2019-03-30 NOTE — Progress Notes (Signed)
Patient ID: Phillip Larson, male   DOB: 1934-06-24, 84 y.o.   MRN: [id number]   Phillip Larson is seen today for followup HTN, HLD  and CAD .Phillip Larson He has been more active. He is watching the salt in his diet.  He has CAD with a total RCA that is collaterized.   Also with PVD seeing Dr Scot Dock with right calf claudication ABI right 0.59  Cath done 01/28/13 from right radial approach and no changes with chronically occluded RCA , robust collaterals and no significant left sided disease Echo reviewed from Sep 13 2016 EF 45-50% mild AR And trivial MR  Subacromial bursitis left shoulder sees [facility]  Alzheimers's started on aricept and namenda  Wife had surgery for her stomach cancer Has lost weight but caught it early He has limited his walking due to claudication and right knee arthritis  No angina   ROS: Denies fever, malais, weight loss, blurry vision, decreased visual acuity, cough, sputum, SOB, hemoptysis, pleuritic pain, palpitaitons, heartburn, abdominal pain, melena, lower extremity edema, claudication, or rash.  All other systems reviewed and negative  BP 100/78   Pulse 61   Ht 5\' 10"  (1.778 m)   Wt 183 lb (83 kg)   SpO2 98%   BMI 26.26 kg/m  Affect appropriate Healthy:  appears stated age 49: normal Neck supple with no adenopathy JVP normal no bruits no thyromegaly Lungs clear with no wheezing and good diaphragmatic motion Heart:  S1/S2 SEM  murmur, no rub, gallop or click PMI normal Abdomen: benighn, BS positve, no tenderness, no AAA no bruit.  No HSM or HJR Decreased right DP/PT pulses  No edema Neuro non-focal Skin warm and dry No muscular weakness      Current Outpatient Medications  Medication Sig Dispense Refill  . ALPRAZolam (XANAX) 0.5 MG tablet TAKE 1 TABLET BY MOUTH ONCE DAILY AS NEEDED 30 tablet 0  . aspirin EC 81 MG tablet Take 81 mg by mouth at bedtime.    Phillip Larson b complex vitamins tablet Take 1 tablet by mouth daily.    Phillip Larson CARTIA XT 240 MG 24 hr capsule TAKE 1  CAPSULE BY MOUTH ONCE DAILY 90 capsule 3  . cetirizine (ZYRTEC) 10 MG tablet Take 1 tablet (10 mg total) by mouth daily. 30 tablet 11  . cholecalciferol (VITAMIN D) 1000 UNITS tablet Take 1,000 Units by mouth daily.    . fluticasone (FLONASE) 50 MCG/ACT nasal spray Place 1 spray into both nostrils 2 (two) times daily as needed for allergies or rhinitis.    Phillip Larson galantamine (RAZADYNE ER) 24 MG 24 hr capsule Take one capsule by mouth once daily with breakfast 90 capsule 1  . isosorbide mononitrate (IMDUR) 30 MG 24 hr tablet Take 15 mg by mouth daily.    Phillip Larson levothyroxine (SYNTHROID) 88 MCG tablet TAKE 1 TABLET BY MOUTH ONCE DAILY ON AN EMPTY STOMACH 30 MINUTES BEFORE BREAKFAST 90 tablet 1  . lovastatin (MEVACOR) 40 MG tablet Take 1 tablet (40 mg total) by mouth at bedtime. 90 tablet 1  . memantine (NAMENDA) 10 MG tablet Take 1 tablet by mouth twice daily 60 tablet 5  . mirabegron ER (MYRBETRIQ) 50 MG TB24 tablet Take 1 tablet (50 mg total) by mouth every other day. 30 tablet 2  . Multiple Vitamin (MULTIVITAMIN WITH MINERALS) TABS tablet Take 1 tablet by mouth daily.    . nitroGLYCERIN (NITROSTAT) 0.4 MG SL tablet One under the tongue if needed for chest tightness. Repeat in 5 minutes if needed. Repeat  again in 5 minutes if needed. 25 tablet 12  . sertraline (ZOLOFT) 50 MG tablet Take 1 tablet by mouth once daily 90 tablet 1  . tamsulosin (FLOMAX) 0.4 MG CAPS capsule TAKE ONE CAPSULE BY MOUTH ONCE DAILY TO HELP RELIEVE OBSTRUCTION 90 capsule 1  . traZODone (DESYREL) 100 MG tablet TAKE 1 TABLET BY MOUTH AT BEDTIME 90 tablet 1  . vitamin C (ASCORBIC ACID) 500 MG tablet Take 500 mg by mouth daily.    Phillip Larson zinc gluconate 50 MG tablet Take 50 mg by mouth at bedtime.    Phillip Larson zolpidem (AMBIEN) 5 MG tablet Take one tablet by mouth once daily at bedtime for sleep 30 tablet 0   No current facility-administered medications for this visit.     Allergies  Iodine and Iohexol  Electrocardiogram:   03/08/14  SR rate 63   Old IMI  No change from 2014  09/14/15  SR rate 65 old IMI poor R Wave progression  09/02/16 SB rate 71 possible old IMI PVC 03/31/19 SR rate 61 PVC normal   Assessment and Plan  CAD:  Chronically occluded RCA , robust collaterals and no significant left sided disease Stable with no angina and good activity level.  Continue medical Rx EF 45-50% by echo 09/13/16   Chol:  On statin Cholesterol is at [facility] 10/11/18     Dementia:  Started on namenda and aricept f/u Dr Nyoka Cowden  HTN:  Well controlled.  Continue current medications and low sodium Dash type diet.    Prostate:  On proscar f/u primary for PSA   Thyroid:  On replacement TSH normal 06/09/18 3.76   Bursitis:  Left shoulder f/u [facility]   Fatigue/Murmur: Echo 09/13/16 trivial MR AV sclerosis and mild AR stable   PVD:  F/u Dr Scot Dock right calf claudication  12/17/18 ABI 0.59 His note indicated no intervention unless he developes severe claudication , rest pain or ulcer Did not mention Pletal but with CAD not likely a good candidate he is limited in walking but hard to tell what is claudication and what is arthritis in his right knee. F/U with [facility] in 6 months          Jenkins Rouge, MD

## 2019-03-31 ENCOUNTER — Other Ambulatory Visit: Payer: Self-pay

## 2019-03-31 ENCOUNTER — Encounter: Payer: Self-pay | Admitting: Cardiovascular Disease

## 2019-03-31 ENCOUNTER — Ambulatory Visit: Payer: Medicare Other | Admitting: Cardiovascular Disease

## 2019-03-31 VITALS — BP 100/78 | HR 61 | Ht 70.0 in | Wt 183.0 lb

## 2019-03-31 DIAGNOSIS — I739 Peripheral vascular disease, unspecified: Secondary | ICD-10-CM | POA: Diagnosis not present

## 2019-03-31 DIAGNOSIS — I2510 Atherosclerotic heart disease of native coronary artery without angina pectoris: Secondary | ICD-10-CM

## 2019-03-31 NOTE — Patient Instructions (Addendum)
Medication Instructions:   *If you need a refill on your cardiac medications before your next appointment, please call your pharmacy*  Lab Work:  If you have labs (blood work) drawn today and your tests are completely normal, you will receive your results only by: [name] MyChart Message (if you have MyChart) OR . A paper copy in the mail If you have any lab test that is abnormal or we need to change your treatment, we will call you to review the results.  Testing/Procedures: None ordered today.  Follow-Up: At [facility], you and your health needs are our priority.  As part of our continuing mission to provide you with exceptional heart care, we have created designated Provider Care Teams.  These Care Teams include your primary Cardiologist (physician) and Advanced Practice Providers (APPs -  Physician Assistants and Nurse Practitioners) who all work together to provide you with the care you need, when you need it.  Your next appointment:   6 month(s)  The format for your next appointment:   In Person  Provider:   You may see Jenkins Rouge, MD or one of the following Advanced Practice Providers on your designated Care Team:    Truitt Merle, NP  Cecilie Kicks, NP  Kathyrn Drown, NP  Your physician recommends that you schedule a follow-up appointment in: 6 months with Dr. Scot Dock. Please call their office and make an appointment. [phone]

## 2019-04-02 ENCOUNTER — Other Ambulatory Visit: Payer: Self-pay

## 2019-04-02 ENCOUNTER — Encounter: Payer: Self-pay | Admitting: Nurse Practitioner

## 2019-04-02 ENCOUNTER — Ambulatory Visit (INDEPENDENT_AMBULATORY_CARE_PROVIDER_SITE_OTHER): Payer: Medicare Other | Admitting: Nurse Practitioner

## 2019-04-02 DIAGNOSIS — F411 Generalized anxiety disorder: Secondary | ICD-10-CM

## 2019-04-02 DIAGNOSIS — F5101 Primary insomnia: Secondary | ICD-10-CM | POA: Diagnosis not present

## 2019-04-02 NOTE — Patient Instructions (Signed)
Not to use alcohol with xanax and ambien   Also alcohol disrupts sleep patterns and can make it harder to sleep- recommended to stop drinking alcohol with sleep problems  Can always go to cognitive behavioral therapist and/or psychiatrist for further recommendation for anxiety and sleep.

## 2019-04-02 NOTE — Addendum Note (Signed)
Addended by: Lauree Chandler on: 04/02/2019 04:15 PM   Modules accepted: Orders

## 2019-04-02 NOTE — Progress Notes (Signed)
This service is provided via telemedicine  No vital signs collected/recorded due to the encounter was a telemedicine visit.   Location of patient (ex: home, work):  Home  Patient consents to a telephone visit:  Yes  Location of the provider (ex: office, home):  [facility], Office   Name of any referring provider: N/A  Names of all persons participating in the telemedicine service and their role in the encounter: S.Chrae B/CMA, Sherrie Mustache, NP, Mrs.Covino (wife), and Patient   Time spent on call:  12 min with medical assistant     Careteam: Patient Care Team: Lauree Chandler, NP as PCP - General (Geriatric Medicine) Josue Hector, MD as PCP - Cardiology (Cardiology) Clent Jacks, MD as Consulting Physician (Ophthalmology) Lamonte Sakai Rose Fillers, MD as Consulting Physician (Pulmonary Disease) Angelia Mould, MD as Consulting Physician (Vascular Surgery) Earlie Server, MD as Consulting Physician (Orthopedic Surgery) Wilford Corner, MD as Consulting Physician (Gastroenterology) Chesley Mires, MD as Consulting Physician (Pulmonary Disease)  Advanced Directive information    Allergies  Allergen Reactions  . Iodine Swelling    Internal iodine  . Iohexol Hives         Chief Complaint  Patient presents with  . Medication Management    Review medications and updated non-opioid treatment agreement. Telephone visit   . Medication Refill    Ambien and Alprazolam (ran out due to dispense number being cut in half)      HPI: Patient is a 84 y.o. male for non-opioid treatment agreement.  Reports he drinks alcohol every day- either beer, wine or liquor. Does not drink to the point of intoxication or the point of being altered.   Insomnia/anxiety- ongoing, using 1 xanax tablet at bedtime and another if he gets up in the middle of the night. Wife states he has a lot of night time anxiety. Also using Ambien at bedtime.  Review of Systems:  Review of  Systems  Psychiatric/Behavioral: The patient is nervous/anxious and has insomnia.     Past Medical History:  Diagnosis Date  . Actinic keratosis   . Alzheimer's disease ([place])   . Anal fissure   . Anxiety state, unspecified   . Benign neoplasm of colon   . BENIGN PROSTATIC HYPERTROPHY, HX OF   . BPH (benign prostatic hypertrophy) with urinary obstruction 06/22/2008   Qualifier: Diagnosis of  By: Johnsie Cancel, MD, Rona Ravens   . Cerebrovascular disease, unspecified   . Coronary atherosclerosis of native coronary artery   . Depressive disorder, not elsewhere classified   . Diverticulosis of colon (without mention of hemorrhage)   . Essential hypertension, benign   . External hemorrhoids without mention of complication   . Insomnia, unspecified   . Mixed hyperlipidemia   . Osteoarthrosis, unspecified whether generalized or localized, unspecified site   . Other premature beats   . Other psoriasis   . Pain in joint, lower leg   . Pain in joint, site unspecified   . Palpitations   . Pressure ulcer   . Reflux esophagitis   . Unspecified essential hypertension   . Unspecified hereditary and idiopathic peripheral neuropathy   . Unspecified hypothyroidism   . Unspecified vitamin D deficiency    Past Surgical History:  Procedure Laterality Date  . CERVICAL DISC SURGERY    . EYE SURGERY Left 02/07/2014   Cataract Surgery Left Eye  . KNEE SURGERY  07/18/2006   Dr French Ana   . LAMINECTOMY C3 disc  1972  . LEFT  HEART CATHETERIZATION WITH CORONARY ANGIOGRAM N/A 01/28/2013   Procedure: LEFT HEART CATHETERIZATION WITH CORONARY ANGIOGRAM;  Surgeon: Josue Hector, MD;  Location: [facility] CATH LAB;  Service: Cardiovascular;  Laterality: N/A;  . MULTIPLE TOOTH EXTRACTIONS  2014  . RETINAL TEAR REPAIR CRYOTHERAPY     Dr Wayne Sever    Social History:   reports that he quit smoking about 33 years ago. His smoking use included cigarettes. He has a 50.00 pack-year smoking history. He has never used  smokeless tobacco. He reports current alcohol use. He reports that he does not use drugs.  Family History  Problem Relation Age of Onset  . Cancer Father        pancreatic  . Cancer Mother        pancreatic    Medications: Patient's Medications  New Prescriptions   No medications on file  Previous Medications   ALPRAZOLAM (XANAX) 0.5 MG TABLET    TAKE 1 TABLET BY MOUTH ONCE DAILY AS NEEDED   ASPIRIN EC 81 MG TABLET    Take 81 mg by mouth at bedtime.   B COMPLEX VITAMINS TABLET    Take 1 tablet by mouth daily.   CARTIA XT 240 MG 24 HR CAPSULE    TAKE 1 CAPSULE BY MOUTH ONCE DAILY   CETIRIZINE (ZYRTEC) 10 MG TABLET    Take 1 tablet (10 mg total) by mouth daily.   CHOLECALCIFEROL (VITAMIN D) 1000 UNITS TABLET    Take 1,000 Units by mouth daily.   FLUTICASONE (FLONASE) 50 MCG/ACT NASAL SPRAY    Place 1 spray into both nostrils 2 (two) times daily as needed for allergies or rhinitis.   GALANTAMINE (RAZADYNE ER) 24 MG 24 HR CAPSULE    Take one capsule by mouth once daily with breakfast   ISOSORBIDE MONONITRATE (IMDUR) 30 MG 24 HR TABLET    Take 15 mg by mouth daily.   LEVOTHYROXINE (SYNTHROID) 88 MCG TABLET    TAKE 1 TABLET BY MOUTH ONCE DAILY ON AN EMPTY STOMACH 30 MINUTES BEFORE BREAKFAST   LOVASTATIN (MEVACOR) 40 MG TABLET    Take 1 tablet (40 mg total) by mouth at bedtime.   MEMANTINE (NAMENDA) 10 MG TABLET    Take 1 tablet by mouth twice daily   MIRABEGRON ER (MYRBETRIQ) 50 MG TB24 TABLET    Take 1 tablet (50 mg total) by mouth every other day.   MULTIPLE VITAMIN (MULTIVITAMIN WITH MINERALS) TABS TABLET    Take 1 tablet by mouth daily.   NITROGLYCERIN (NITROSTAT) 0.4 MG SL TABLET    One under the tongue if needed for chest tightness. Repeat in 5 minutes if needed. Repeat again in 5 minutes if needed.   SERTRALINE (ZOLOFT) 50 MG TABLET    Take 1 tablet by mouth once daily   TAMSULOSIN (FLOMAX) 0.4 MG CAPS CAPSULE    TAKE ONE CAPSULE BY MOUTH ONCE DAILY TO HELP RELIEVE OBSTRUCTION    TRAZODONE (DESYREL) 100 MG TABLET    TAKE 1 TABLET BY MOUTH AT BEDTIME   VITAMIN C (ASCORBIC ACID) 500 MG TABLET    Take 500 mg by mouth daily.   ZINC GLUCONATE 50 MG TABLET    Take 50 mg by mouth at bedtime.   ZOLPIDEM (AMBIEN) 5 MG TABLET    Take one tablet by mouth once daily at bedtime for sleep  Modified Medications   No medications on file  Discontinued Medications   No medications on file    Physical Exam:  There were  no vitals filed for this visit. There is no height or weight on file to calculate BMI. Wt Readings from Last 3 Encounters:  03/31/19 183 lb (83 kg)  02/03/19 182 lb 8 oz (82.8 kg)  12/10/18 183 lb (83 kg)     Labs reviewed: Basic Metabolic Panel: Recent Labs    06/09/18 0911 12/11/18 0822  NA 140 141  K 4.5 4.1  CL 107 107  CO2 29 25  GLUCOSE 83 92  BUN 15 11  CREATININE 1.34* 1.30*  CALCIUM 9.3 9.9  TSH 3.76  --    Liver Function Tests: Recent Labs    06/09/18 0911 12/11/18 0822  AST 25 32  ALT 25 35  BILITOT 0.5 0.5  PROT 6.1 6.0*   No results for input(s): LIPASE, AMYLASE in the last 8760 hours. No results for input(s): AMMONIA in the last 8760 hours. CBC: Recent Labs    06/09/18 0911 12/11/18 0822  WBC 5.0 5.3  NEUTROABS 2,775 3,408  HGB 14.8 15.2  HCT 43.4 44.1  MCV 96.7 96.7  PLT 222 196   Lipid Panel: Recent Labs    06/09/18 0911 12/11/18 0822  CHOL 172 153  HDL 57 62  LDLCALC 84 69  TRIG 222* 136  CHOLHDL 3.0 2.5   TSH: Recent Labs    06/09/18 0911  TSH 3.76   A1C: No results found for: HGBA1C   Assessment/Plan 1. Primary insomnia -continues on Ambien as needed with xanax of insomnia- encouraged to stop drinking ETOH as this can effect sleep patterns. Wife reports he has a lot of night time anxiety and encouraged him to seek treatment through cognitive behavioral therapist. Wife and pt in agreement  2. Anxiety state Stable however continues to need night time xanax. Reviewed Rx instructions and  educated not to take more than prescribed.  Educated not to use ETOH while taking medication such as xanax as this increase risk of adverse drug reaction and respiratory depression. Will mail the non-opioid contract to pt to sign and return within 1 month. Also can seek care with a psychiatrist and psychologist  if he does not feel like his night time anxiety is controlled.   Next appt: 06/17/2019 as scheduled.  Phillip Larson. Harle Battiest   Virtual Visit via Telephone Note  I connected with pt on 04/02/19 at  1:30 PM EST by telephone and verified that I am speaking with the correct person using two identifiers.  Location: Patient: home Provider: office   I discussed the limitations, risks, security and privacy concerns of performing an evaluation and management service by telephone and the availability of in person appointments. I also discussed with the patient that there may be a patient responsible charge related to this service. The patient expressed understanding and agreed to proceed.   I discussed the assessment and treatment plan with the patient. The patient was provided an opportunity to ask questions and all were answered. The patient agreed with the plan and demonstrated an understanding of the instructions.   The patient was advised to call back or seek an in-person evaluation if the symptoms worsen or if the condition fails to improve as anticipated.  I provided 12 minutes of non-face-to-face time during this encounter.  Phillip Larson. Harle Battiest Avs printed and mailed      [place] [phone]

## 2019-04-07 ENCOUNTER — Other Ambulatory Visit: Payer: Self-pay | Admitting: Nurse Practitioner

## 2019-04-07 MED ORDER — ZOLPIDEM TARTRATE 5 MG PO TABS: ORAL_TABLET | ORAL | 0 refills | Status: DC

## 2019-04-07 NOTE — Telephone Encounter (Signed)
Patient wife requested refill. Phoned to pharmacy.

## 2019-04-12 ENCOUNTER — Other Ambulatory Visit: Payer: Self-pay | Admitting: Nurse Practitioner

## 2019-04-12 DIAGNOSIS — F5101 Primary insomnia: Secondary | ICD-10-CM

## 2019-04-12 NOTE — Telephone Encounter (Signed)
Patient wife Requested refill.

## 2019-04-13 ENCOUNTER — Other Ambulatory Visit: Payer: Self-pay | Admitting: Cardiovascular Disease

## 2019-04-13 DIAGNOSIS — I10 Essential (primary) hypertension: Secondary | ICD-10-CM

## 2019-05-11 ENCOUNTER — Other Ambulatory Visit: Payer: Self-pay | Admitting: Nurse Practitioner

## 2019-05-11 NOTE — Telephone Encounter (Signed)
Phoned Rx to pharmacy.

## 2019-05-11 NOTE — Telephone Encounter (Signed)
Incoming refill request for Ambien.  Non-opioid treatment agreement on file from: 04/06/2019  RX last filled on 04/07/2019

## 2019-05-11 NOTE — Telephone Encounter (Signed)
Rodena Piety didn't you already phone this in?

## 2019-05-13 ENCOUNTER — Other Ambulatory Visit: Payer: Self-pay | Admitting: Nurse Practitioner

## 2019-05-13 ENCOUNTER — Telehealth: Payer: Self-pay | Admitting: *Deleted

## 2019-05-13 DIAGNOSIS — F5101 Primary insomnia: Secondary | ICD-10-CM

## 2019-05-13 NOTE — Telephone Encounter (Signed)
Patient wife called and stated that patient has an appointment on Tuesday at the [facility] to get the Covid Vaccination. The [facility] told him to call his PCP to make sure it was ok to get it with is Diagnosis and Medications. Please Advise.

## 2019-05-13 NOTE — Telephone Encounter (Signed)
Non-opioid treatment agreement on file from: 04/06/2019  RX last filled on 04/12/2019

## 2019-05-14 NOTE — Telephone Encounter (Signed)
He should be okay to get vaccine. Would be more cautious if there is a hx of vaccine reaction.

## 2019-05-14 NOTE — Telephone Encounter (Signed)
Non-opioid contract was done in December.  Last refill 04/11/20.

## 2019-05-14 NOTE — Telephone Encounter (Signed)
Patient wife notified and agreed.  

## 2019-05-27 NOTE — Telephone Encounter (Signed)
Medication already refilled.

## 2019-06-08 ENCOUNTER — Other Ambulatory Visit: Payer: Self-pay | Admitting: Family

## 2019-06-08 DIAGNOSIS — F3342 Major depressive disorder, recurrent, in full remission: Secondary | ICD-10-CM

## 2019-06-11 ENCOUNTER — Other Ambulatory Visit: Payer: Self-pay | Admitting: Nurse Practitioner

## 2019-06-11 NOTE — Telephone Encounter (Signed)
Last filled in [facility] on 05/11/19  Treatment agreement on filed from December 2020

## 2019-06-11 NOTE — Telephone Encounter (Signed)
Patient requested. Phoned to pharmacy.  

## 2019-06-14 ENCOUNTER — Other Ambulatory Visit: Payer: Self-pay | Admitting: Nurse Practitioner

## 2019-06-14 DIAGNOSIS — F5101 Primary insomnia: Secondary | ICD-10-CM

## 2019-06-14 NOTE — Telephone Encounter (Signed)
Non-opioid treatment agreement on file from: 04/06/2019  RX last filled on: 05/13/19

## 2019-06-17 ENCOUNTER — Ambulatory Visit: Payer: Medicare Other | Admitting: Nurse Practitioner

## 2019-06-18 ENCOUNTER — Encounter: Payer: Self-pay | Admitting: Nurse Practitioner

## 2019-06-18 ENCOUNTER — Other Ambulatory Visit: Payer: Self-pay

## 2019-06-18 ENCOUNTER — Ambulatory Visit (INDEPENDENT_AMBULATORY_CARE_PROVIDER_SITE_OTHER): Payer: Medicare Other | Admitting: Nurse Practitioner

## 2019-06-18 VITALS — BP 138/90 | HR 68 | Temp 97.1°F | Ht 70.0 in | Wt 185.0 lb

## 2019-06-18 DIAGNOSIS — E782 Mixed hyperlipidemia: Secondary | ICD-10-CM | POA: Diagnosis not present

## 2019-06-18 DIAGNOSIS — F5101 Primary insomnia: Secondary | ICD-10-CM

## 2019-06-18 DIAGNOSIS — I10 Essential (primary) hypertension: Secondary | ICD-10-CM | POA: Diagnosis not present

## 2019-06-18 DIAGNOSIS — I739 Peripheral vascular disease, unspecified: Secondary | ICD-10-CM

## 2019-06-18 DIAGNOSIS — I25118 Atherosclerotic heart disease of native coronary artery with other forms of angina pectoris: Secondary | ICD-10-CM

## 2019-06-18 DIAGNOSIS — N3281 Overactive bladder: Secondary | ICD-10-CM

## 2019-06-18 DIAGNOSIS — F411 Generalized anxiety disorder: Secondary | ICD-10-CM

## 2019-06-18 DIAGNOSIS — E039 Hypothyroidism, unspecified: Secondary | ICD-10-CM

## 2019-06-18 DIAGNOSIS — G301 Alzheimer's disease with late onset: Secondary | ICD-10-CM

## 2019-06-18 DIAGNOSIS — F3342 Major depressive disorder, recurrent, in full remission: Secondary | ICD-10-CM | POA: Diagnosis not present

## 2019-06-18 DIAGNOSIS — F0280 Dementia in other diseases classified elsewhere without behavioral disturbance: Secondary | ICD-10-CM

## 2019-06-18 NOTE — Progress Notes (Signed)
Careteam: Patient Care Team: Lauree Chandler, NP as PCP - General (Geriatric Medicine) Josue Hector, MD as PCP - Cardiology (Cardiology) Clent Jacks, MD as Consulting Physician (Ophthalmology) Lamonte Sakai Rose Fillers, MD as Consulting Physician (Pulmonary Disease) Angelia Mould, MD as Consulting Physician (Vascular Surgery) Earlie Server, MD as Consulting Physician (Orthopedic Surgery) Wilford Corner, MD as Consulting Physician (Gastroenterology) Chesley Mires, MD as Consulting Physician (Pulmonary Disease)  Advanced Directive information Does Patient Have a Medical Advance Directive?: Yes, Type of Advance Directive: [place];Living will, Does patient want to make changes to medical advance directive?: No - Patient declined  Allergies  Allergen Reactions  . Iodine Swelling    Internal iodine  . Iohexol Hives         Chief Complaint  Patient presents with  . Medical Management of Chronic Issues    6 month follow-up. Patient gets tried really easy.   . Leg Problem    Ongoing calf issues      HPI: Patient is a 85 y.o. male for routine follow up.   PVD- following with vascular due to right calf claudication. He has been walking in the house all winter and walking outside. Still has trouble with walking. No follow up with vascular scheduled.   CAD- following with cardiologist. No chest pains or shortness of breath. Continues on imdur  Hyperlipidemia- at goal 69 on 6/20 has been on lovstatin.   Dementia- continues on razadyne and namenda. Wife reports short term memory is a little worse. Wife does medications. Does not do some of the chores he used to.   Hypothyroid- due for TSH, continues on supplement.   htn- controlled.   bph and OAB- continues on myrbetriq and proscar   Insomnia- continues to drink wine in the evening. Continues to use Ambien and alprazolam at night. Sleeping well and resting well at this time.   Review of  Systems:  Review of Systems  Constitutional: Negative for chills, fever and weight loss.  HENT: Negative for tinnitus.   Respiratory: Negative for cough, sputum production and shortness of breath.   Cardiovascular: Positive for claudication. Negative for chest pain, palpitations and leg swelling.  Gastrointestinal: Negative for abdominal pain, constipation, diarrhea and heartburn.  Genitourinary: Negative for dysuria, frequency and urgency.  Musculoskeletal: Negative for back pain, falls, joint pain and myalgias.  Skin: Negative.   Neurological: Negative for dizziness and headaches.  Psychiatric/Behavioral: Positive for memory loss. Negative for depression. The patient is not nervous/anxious and does not have insomnia.     Past Medical History:  Diagnosis Date  . Actinic keratosis   . Alzheimer's disease ([place])   . Anal fissure   . Anxiety state, unspecified   . Benign neoplasm of colon   . BENIGN PROSTATIC HYPERTROPHY, HX OF   . BPH (benign prostatic hypertrophy) with urinary obstruction 06/22/2008   Qualifier: Diagnosis of  By: Johnsie Cancel, MD, Rona Ravens   . Cerebrovascular disease, unspecified   . Coronary atherosclerosis of native coronary artery   . Depressive disorder, not elsewhere classified   . Diverticulosis of colon (without mention of hemorrhage)   . Essential hypertension, benign   . External hemorrhoids without mention of complication   . Insomnia, unspecified   . Mixed hyperlipidemia   . Osteoarthrosis, unspecified whether generalized or localized, unspecified site   . Other premature beats   . Other psoriasis   . Pain in joint, lower leg   . Pain in joint, site unspecified   .  Palpitations   . Pressure ulcer   . Reflux esophagitis   . Unspecified essential hypertension   . Unspecified hereditary and idiopathic peripheral neuropathy   . Unspecified hypothyroidism   . Unspecified vitamin D deficiency    Past Surgical History:  Procedure Laterality Date    . CERVICAL DISC SURGERY    . EYE SURGERY Left 02/07/2014   Cataract Surgery Left Eye  . KNEE SURGERY  07/18/2006   Dr French Ana   . LAMINECTOMY C3 disc  1972  . LEFT HEART CATHETERIZATION WITH CORONARY ANGIOGRAM N/A 01/28/2013   Procedure: LEFT HEART CATHETERIZATION WITH CORONARY ANGIOGRAM;  Surgeon: Josue Hector, MD;  Location: [facility] CATH LAB;  Service: Cardiovascular;  Laterality: N/A;  . MULTIPLE TOOTH EXTRACTIONS  2014  . RETINAL TEAR REPAIR CRYOTHERAPY     Dr Wayne Sever    Social History:   reports that he quit smoking about 27 years ago. His smoking use included cigarettes. He has a 50.00 pack-year smoking history. He has never used smokeless tobacco. He reports current alcohol use. He reports that he does not use drugs.  Family History  Problem Relation Age of Onset  . Cancer Father        pancreatic  . Cancer Mother        pancreatic    Medications: Patient's Medications  New Prescriptions   No medications on file  Previous Medications   ALPRAZOLAM (XANAX) 0.5 MG TABLET    TAKE 1 TABLET BY MOUTH ONCE DAILY AS NEEDED   ASPIRIN EC 81 MG TABLET    Take 81 mg by mouth at bedtime.   B COMPLEX VITAMINS TABLET    Take 1 tablet by mouth daily.   CARTIA XT 240 MG 24 HR CAPSULE    Take 1 capsule by mouth once daily   CETIRIZINE (ZYRTEC) 10 MG TABLET    Take 1 tablet (10 mg total) by mouth daily.   CHOLECALCIFEROL (VITAMIN D) 1000 UNITS TABLET    Take 1,000 Units by mouth daily.   FLUTICASONE (FLONASE) 50 MCG/ACT NASAL SPRAY    Place 1 spray into both nostrils 2 (two) times daily as needed for allergies or rhinitis.   GALANTAMINE (RAZADYNE ER) 24 MG 24 HR CAPSULE    Take one capsule by mouth once daily with breakfast   ISOSORBIDE MONONITRATE (IMDUR) 30 MG 24 HR TABLET    Take 15 mg by mouth daily.   LEVOTHYROXINE (SYNTHROID) 88 MCG TABLET    TAKE 1 TABLET BY MOUTH ONCE DAILY ON AN EMPTY STOMACH 30 MINUTES BEFORE BREAKFAST   LOVASTATIN (MEVACOR) 40 MG TABLET    Take 1 tablet (40 mg total)  by mouth at bedtime.   MEMANTINE (NAMENDA) 10 MG TABLET    Take 1 tablet by mouth twice daily   MIRABEGRON ER (MYRBETRIQ) 50 MG TB24 TABLET    Take 1 tablet (50 mg total) by mouth every other day.   MULTIPLE VITAMIN (MULTIVITAMIN WITH MINERALS) TABS TABLET    Take 1 tablet by mouth daily.   NITROGLYCERIN (NITROSTAT) 0.4 MG SL TABLET    One under the tongue if needed for chest tightness. Repeat in 5 minutes if needed. Repeat again in 5 minutes if needed.   SERTRALINE (ZOLOFT) 50 MG TABLET    Take 1 tablet by mouth once daily   TAMSULOSIN (FLOMAX) 0.4 MG CAPS CAPSULE    TAKE ONE CAPSULE BY MOUTH ONCE DAILY TO HELP RELIEVE OBSTRUCTION   TRAZODONE (DESYREL) 100 MG TABLET  TAKE 1 TABLET BY MOUTH AT BEDTIME   VITAMIN C (ASCORBIC ACID) 500 MG TABLET    Take 500 mg by mouth daily.   ZINC GLUCONATE 50 MG TABLET    Take 50 mg by mouth at bedtime.   ZOLPIDEM (AMBIEN) 5 MG TABLET    TAKE 1 TABLET BY MOUTH EVERY DAY AT BEDTIME FOR SLEEP  Modified Medications   No medications on file  Discontinued Medications   No medications on file    Physical Exam:  Vitals:   06/18/19 1044  BP: 138/90  Pulse: 68  Temp: (!) 97.1 F (36.2 C)  TempSrc: Temporal  SpO2: 97%  Weight: 185 lb (83.9 kg)  Height: '5\' 10"'  (1.778 m)   Body mass index is 26.54 kg/m. Wt Readings from Last 3 Encounters:  06/18/19 185 lb (83.9 kg)  03/31/19 183 lb (83 kg)  02/03/19 182 lb 8 oz (82.8 kg)    Physical Exam Constitutional:      General: He is not in acute distress.    Appearance: He is well-developed. He is not diaphoretic.  HENT:     Head: Normocephalic and atraumatic.     Mouth/Throat:     Pharynx: No oropharyngeal exudate.  Eyes:     Conjunctiva/sclera: Conjunctivae normal.     Pupils: Pupils are equal, round, and reactive to light.  Cardiovascular:     Rate and Rhythm: Normal rate and regular rhythm.     Heart sounds: Normal heart sounds.  Pulmonary:     Effort: Pulmonary effort is normal.     Breath  sounds: Normal breath sounds.  Abdominal:     General: Bowel sounds are normal.     Palpations: Abdomen is soft.  Musculoskeletal:        General: No tenderness.     Cervical back: Normal range of motion and neck supple.  Skin:    General: Skin is warm and dry.  Neurological:     Mental Status: He is alert and oriented to person, place, and time.     Labs reviewed: Basic Metabolic Panel: Recent Labs    12/11/18 0822  NA 141  K 4.1  CL 107  CO2 25  GLUCOSE 92  BUN 11  CREATININE 1.30*  CALCIUM 9.9   Liver Function Tests: Recent Labs    12/11/18 0822  AST 32  ALT 35  BILITOT 0.5  PROT 6.0*   No results for input(s): LIPASE, AMYLASE in the last 8760 hours. No results for input(s): AMMONIA in the last 8760 hours. CBC: Recent Labs    12/11/18 0822  WBC 5.3  NEUTROABS 3,408  HGB 15.2  HCT 44.1  MCV 96.7  PLT 196   Lipid Panel: Recent Labs    12/11/18 0822  CHOL 153  HDL 62  LDLCALC 69  TRIG 136  CHOLHDL 2.5   TSH: No results for input(s): TSH in the last 8760 hours. A1C: No results found for: HGBA1C   Assessment/Plan 1. PVD (peripheral vascular disease) (HCC) -ongoing symptoms, continues on asa. Encouraged follow up with vascular due to ongoing symptoms.   2. Recurrent major depressive disorder, in full remission ([place]) Stable. Continues on zoloft 50 mg daily   3. Mixed hyperlipidemia LDL at goal. Continues on statin.  - CMP with eGFR([facility])  4. Essential hypertension, benign Controlled on diltiazem  - CMP with eGFR([facility]) - CBC with Differential/Platelet  5. Anxiety state Stable, without symptoms at this time.   6. Primary insomnia -reports he is sleeping good  at this time. Encouraged to attempt to cut back on alprazolam, to try taking 1.2   7. Overactive bladder Controlled on myrbetriq  8. Hypothyroidism, unspecified type Continues on synthroid - TSH  9. Late onset Alzheimer's disease without behavioral disturbance  ([place]) Continues on galantamine and namenda. Wife notices more short term memory problems. He is still very high functioning at home.  10. Atherosclerosis of native coronary artery of native heart with stable angina pectoris ([place]) Stable, continues on imdur, LDL at goal on statin.   Next appt: 6 months 4 weeks for MOST form completion.   Carlos American. [place], [facility] [phone]

## 2019-06-18 NOTE — Patient Instructions (Addendum)
To try to cut back xanax to half tablet at bedtime then you can take the other half if you can not sleep.   Look over MOST form-- follow up in 2-4 weeks for virtual visit for MOST form

## 2019-06-19 LAB — CBC WITH DIFFERENTIAL/PLATELET
Absolute Monocytes: 427 cells/uL (ref 200–950)
Basophils Absolute: 18 cells/uL (ref 0–200)
Basophils Relative: 0.3 %
Eosinophils Absolute: 299 cells/uL (ref 15–500)
Eosinophils Relative: 4.9 %
HCT: 46.9 % (ref 38.5–50.0)
Hemoglobin: 16.2 g/dL (ref 13.2–17.1)
Lymphs Abs: 1348 cells/uL (ref 850–3900)
MCH: 33.5 pg — ABNORMAL HIGH (ref 27.0–33.0)
MCHC: 34.5 g/dL (ref 32.0–36.0)
MCV: 96.9 fL (ref 80.0–100.0)
MPV: 10.5 fL (ref 7.5–12.5)
Monocytes Relative: 7 %
Neutro Abs: 4008 cells/uL (ref 1500–7800)
Neutrophils Relative %: 65.7 %
Platelets: 225 10*3/uL (ref 140–400)
RBC: 4.84 10*6/uL (ref 4.20–5.80)
RDW: 12.2 % (ref 11.0–15.0)
Total Lymphocyte: 22.1 %
WBC: 6.1 10*3/uL (ref 3.8–10.8)

## 2019-06-19 LAB — COMPLETE METABOLIC PANEL WITH GFR
AG Ratio: 2 (calc) (ref 1.0–2.5)
ALT: 46 U/L (ref 9–46)
AST: 46 U/L — ABNORMAL HIGH (ref 10–35)
Albumin: 4.1 g/dL (ref 3.6–5.1)
Alkaline phosphatase (APISO): 114 U/L (ref 35–144)
BUN: 9 mg/dL (ref 7–25)
CO2: 24 mmol/L (ref 20–32)
Calcium: 9.4 mg/dL (ref 8.6–10.3)
Chloride: 101 mmol/L (ref 98–110)
Creat: 1.11 mg/dL (ref 0.70–1.11)
GFR, Est African American: 71 mL/min/{1.73_m2} (ref >=60)
GFR, Est Non African American: 62 mL/min/{1.73_m2} (ref >=60)
Globulin: 2.1 g/dL (calc) (ref 1.9–3.7)
Glucose, Bld: 93 mg/dL (ref 65–99)
Potassium: 4.1 mmol/L (ref 3.5–5.3)
Sodium: 138 mmol/L (ref 135–146)
Total Bilirubin: 0.7 mg/dL (ref 0.2–1.2)
Total Protein: 6.2 g/dL (ref 6.1–8.1)

## 2019-06-19 LAB — TSH: TSH: 2.66 mIU/L (ref 0.40–4.50)

## 2019-06-20 ENCOUNTER — Other Ambulatory Visit: Payer: Self-pay | Admitting: Nurse Practitioner

## 2019-06-22 ENCOUNTER — Telehealth: Payer: Self-pay

## 2019-06-22 DIAGNOSIS — Z961 Presence of intraocular lens: Secondary | ICD-10-CM | POA: Diagnosis not present

## 2019-06-22 DIAGNOSIS — H43813 Vitreous degeneration, bilateral: Secondary | ICD-10-CM | POA: Diagnosis not present

## 2019-06-22 DIAGNOSIS — H04123 Dry eye syndrome of bilateral lacrimal glands: Secondary | ICD-10-CM | POA: Diagnosis not present

## 2019-06-22 DIAGNOSIS — H40013 Open angle with borderline findings, low risk, bilateral: Secondary | ICD-10-CM | POA: Diagnosis not present

## 2019-06-22 NOTE — Telephone Encounter (Signed)
Yes this is fine.

## 2019-06-22 NOTE — Telephone Encounter (Signed)
Incoming fax received from [facility] with the heading "Need for Clarification"   Regarding rx approval for Levothyroxine. RX was approved yesterday.   Note to prescriber: We are now using a different manufacture are you ok if we change?  Please advise

## 2019-06-22 NOTE — Telephone Encounter (Signed)
I called [facility], spoke with pharmacist and gave the ok per Lauree Chandler, NP to change manufactures

## 2019-07-09 ENCOUNTER — Encounter: Payer: Self-pay | Admitting: Nurse Practitioner

## 2019-07-09 ENCOUNTER — Other Ambulatory Visit: Payer: Self-pay

## 2019-07-09 ENCOUNTER — Telehealth (INDEPENDENT_AMBULATORY_CARE_PROVIDER_SITE_OTHER): Payer: Medicare Other | Admitting: Nurse Practitioner

## 2019-07-09 DIAGNOSIS — F5101 Primary insomnia: Secondary | ICD-10-CM

## 2019-07-09 DIAGNOSIS — F0280 Dementia in other diseases classified elsewhere without behavioral disturbance: Secondary | ICD-10-CM

## 2019-07-09 DIAGNOSIS — Z7189 Other specified counseling: Secondary | ICD-10-CM

## 2019-07-09 DIAGNOSIS — F3342 Major depressive disorder, recurrent, in full remission: Secondary | ICD-10-CM

## 2019-07-09 DIAGNOSIS — E782 Mixed hyperlipidemia: Secondary | ICD-10-CM | POA: Diagnosis not present

## 2019-07-09 DIAGNOSIS — G301 Alzheimer's disease with late onset: Secondary | ICD-10-CM | POA: Diagnosis not present

## 2019-07-09 MED ORDER — ZOLPIDEM TARTRATE 5 MG PO TABS: 5.0000 mg | ORAL_TABLET | Freq: Every evening | ORAL | 0 refills | Status: DC | PRN

## 2019-07-09 MED ORDER — MEMANTINE HCL 10 MG PO TABS: 10.0000 mg | ORAL_TABLET | Freq: Two times a day (BID) | ORAL | 1 refills | Status: DC

## 2019-07-09 MED ORDER — SERTRALINE HCL 50 MG PO TABS: 50.0000 mg | ORAL_TABLET | Freq: Every day | ORAL | 1 refills | Status: DC

## 2019-07-09 MED ORDER — LOVASTATIN 40 MG PO TABS: 40.0000 mg | ORAL_TABLET | Freq: Every day | ORAL | 1 refills | Status: DC

## 2019-07-09 NOTE — Progress Notes (Signed)
This service is provided via telemedicine  No vital signs collected/recorded due to the encounter was a telemedicine visit.   Location of patient (ex: home, work):  Home  Patient consents to a telephone visit:  Yes   Location of the provider (ex: office, home):  [facility], Office   Name of any referring provider:  N/A  Names of all persons participating in the telemedicine service and their role in the encounter:  S.Chrae B/CMA, Sherrie Mustache, NP, Mrs.Emley, and Patient   Time spent on call:  5 min with medical assistant      Careteam: Patient Care Team: Lauree Chandler, NP as PCP - General (Geriatric Medicine) Josue Hector, MD as PCP - Cardiology (Cardiology) Clent Jacks, MD as Consulting Physician (Ophthalmology) Lamonte Sakai Rose Fillers, MD as Consulting Physician (Pulmonary Disease) Angelia Mould, MD as Consulting Physician (Vascular Surgery) Earlie Server, MD as Consulting Physician (Orthopedic Surgery) Wilford Corner, MD as Consulting Physician (Gastroenterology) Chesley Mires, MD as Consulting Physician (Pulmonary Disease)  PLACE OF SERVICE:  [place]  Advanced Directive information    Allergies  Allergen Reactions  . Iodine Swelling    Internal iodine  . Iohexol Hives         Chief Complaint  Patient presents with  . Advanced Directive    Advance care planning. Telehealth/[place] Video Visit   . Medication Management    Patient changed namenda to once daily vs twice daily due to cost   . Medication Refill    Renew lovastatin and zoloft      HPI: Patient is a 85 y.o. male for virtual visit for most form completion.   Memory loss- namenda cut back to daily due to cost but has not looked into this recently. No notable changes in memory.   Most form reviewed with pt and wife today.    Review of Systems:  Review of Systems  Constitutional: Negative for chills and fever.  Psychiatric/Behavioral: Positive for memory  loss. Negative for depression. The patient is not nervous/anxious and does not have insomnia.        Controlled on medication    Past Medical History:  Diagnosis Date  . Actinic keratosis   . Alzheimer's disease ([place])   . Anal fissure   . Anxiety state, unspecified   . Benign neoplasm of colon   . BENIGN PROSTATIC HYPERTROPHY, HX OF   . BPH (benign prostatic hypertrophy) with urinary obstruction 06/22/2008   Qualifier: Diagnosis of  By: Johnsie Cancel, MD, Rona Ravens   . Cerebrovascular disease, unspecified   . Coronary atherosclerosis of native coronary artery   . Depressive disorder, not elsewhere classified   . Diverticulosis of colon (without mention of hemorrhage)   . Essential hypertension, benign   . External hemorrhoids without mention of complication   . Insomnia, unspecified   . Mixed hyperlipidemia   . Osteoarthrosis, unspecified whether generalized or localized, unspecified site   . Other premature beats   . Other psoriasis   . Pain in joint, lower leg   . Pain in joint, site unspecified   . Palpitations   . Pressure ulcer   . Reflux esophagitis   . Unspecified essential hypertension   . Unspecified hereditary and idiopathic peripheral neuropathy   . Unspecified hypothyroidism   . Unspecified vitamin D deficiency    Past Surgical History:  Procedure Laterality Date  . CERVICAL DISC SURGERY    . EYE SURGERY Left 02/07/2014   Cataract Surgery Left Eye  .  KNEE SURGERY  07/18/2006   Dr French Ana   . LAMINECTOMY C3 disc  1972  . LEFT HEART CATHETERIZATION WITH CORONARY ANGIOGRAM N/A 01/28/2013   Procedure: LEFT HEART CATHETERIZATION WITH CORONARY ANGIOGRAM;  Surgeon: Josue Hector, MD;  Location: [facility] CATH LAB;  Service: Cardiovascular;  Laterality: N/A;  . MULTIPLE TOOTH EXTRACTIONS  2014  . RETINAL TEAR REPAIR CRYOTHERAPY     Dr Wayne Sever    Social History:   reports that he quit smoking about 27 years ago. His smoking use included cigarettes. He has a 50.00  pack-year smoking history. He has never used smokeless tobacco. He reports current alcohol use. He reports that he does not use drugs.  Family History  Problem Relation Age of Onset  . Cancer Father        pancreatic  . Cancer Mother        pancreatic    Medications: Patient's Medications  New Prescriptions   No medications on file  Previous Medications   ALPRAZOLAM (XANAX) 0.5 MG TABLET    TAKE 1 TABLET BY MOUTH ONCE DAILY AS NEEDED   ASPIRIN EC 81 MG TABLET    Take 81 mg by mouth at bedtime.   B COMPLEX VITAMINS TABLET    Take 1 tablet by mouth daily.   CARTIA XT 240 MG 24 HR CAPSULE    Take 1 capsule by mouth once daily   CETIRIZINE (ZYRTEC) 10 MG TABLET    Take 1 tablet (10 mg total) by mouth daily.   CHOLECALCIFEROL (VITAMIN D) 1000 UNITS TABLET    Take 1,000 Units by mouth daily.   FLUTICASONE (FLONASE) 50 MCG/ACT NASAL SPRAY    Place 1 spray into both nostrils 2 (two) times daily as needed for allergies or rhinitis.   GALANTAMINE (RAZADYNE ER) 24 MG 24 HR CAPSULE    Take one capsule by mouth once daily with breakfast   ISOSORBIDE MONONITRATE (IMDUR) 30 MG 24 HR TABLET    Take 15 mg by mouth daily.   LEVOTHYROXINE (SYNTHROID) 88 MCG TABLET    TAKE 1 TABLET BY MOUTH ONCE DAILY ON AN EMPTY STOMACH 30 MINUTES BEFORE BREAKFAST   LOVASTATIN (MEVACOR) 40 MG TABLET    Take 1 tablet (40 mg total) by mouth at bedtime.   MEMANTINE (NAMENDA) 10 MG TABLET    Take 10 mg by mouth daily.   MIRABEGRON ER (MYRBETRIQ) 50 MG TB24 TABLET    Take 1 tablet (50 mg total) by mouth every other day.   MULTIPLE VITAMIN (MULTIVITAMIN WITH MINERALS) TABS TABLET    Take 1 tablet by mouth daily.   NITROGLYCERIN (NITROSTAT) 0.4 MG SL TABLET    One under the tongue if needed for chest tightness. Repeat in 5 minutes if needed. Repeat again in 5 minutes if needed.   SERTRALINE (ZOLOFT) 50 MG TABLET    Take 1 tablet by mouth once daily   TAMSULOSIN (FLOMAX) 0.4 MG CAPS CAPSULE    TAKE ONE CAPSULE BY MOUTH ONCE  DAILY TO HELP RELIEVE OBSTRUCTION   TRAZODONE (DESYREL) 100 MG TABLET    TAKE 1 TABLET BY MOUTH AT BEDTIME   VITAMIN C (ASCORBIC ACID) 500 MG TABLET    Take 500 mg by mouth daily.   ZINC GLUCONATE 50 MG TABLET    Take 50 mg by mouth at bedtime.   ZOLPIDEM (AMBIEN) 5 MG TABLET    TAKE 1 TABLET BY MOUTH EVERY DAY AT BEDTIME FOR SLEEP  Modified Medications   No medications on  file  Discontinued Medications   MEMANTINE (NAMENDA) 10 MG TABLET    Take 1 tablet by mouth twice daily    Physical Exam:  There were no vitals filed for this visit. There is no height or weight on file to calculate BMI. Wt Readings from Last 3 Encounters:  06/18/19 185 lb (83.9 kg)  03/31/19 183 lb (83 kg)  02/03/19 182 lb 8 oz (82.8 kg)      Labs reviewed: Basic Metabolic Panel: Recent Labs    12/11/18 0822 06/18/19 1124  NA 141 138  K 4.1 4.1  CL 107 101  CO2 25 24  GLUCOSE 92 93  BUN 11 9  CREATININE 1.30* 1.11  CALCIUM 9.9 9.4  TSH  --  2.66   Liver Function Tests: Recent Labs    12/11/18 0822 06/18/19 1124  AST 32 46*  ALT 35 46  BILITOT 0.5 0.7  PROT 6.0* 6.2   No results for input(s): LIPASE, AMYLASE in the last 8760 hours. No results for input(s): AMMONIA in the last 8760 hours. CBC: Recent Labs    12/11/18 0822 06/18/19 1124  WBC 5.3 6.1  NEUTROABS 3,408 4,008  HGB 15.2 16.2  HCT 44.1 46.9  MCV 96.7 96.9  PLT 196 225   Lipid Panel: Recent Labs    12/11/18 0822  CHOL 153  HDL 62  LDLCALC 69  TRIG 136  CHOLHDL 2.5   TSH: Recent Labs    06/18/19 1124  TSH 2.66   A1C: No results found for: HGBA1C   Assessment/Plan 1. Mixed hyperlipidemia -refill provided - lovastatin (MEVACOR) 40 MG tablet; Take 1 tablet (40 mg total) by mouth at bedtime.  Dispense: 90 tablet; Refill: 1  2. Recurrent major depressive disorder, in full remission ([place]) Controlled on medication, refill provided - sertraline (ZOLOFT) 50 MG tablet; Take 1 tablet (50 mg total) by mouth daily.   Dispense: 90 tablet; Refill: 1  3. Primary insomnia - zolpidem (AMBIEN) 5 MG tablet; Take 1 tablet (5 mg total) by mouth at bedtime as needed for sleep.  Dispense: 30 tablet; Refill: 0  4. Late onset Alzheimer's disease without behavioral disturbance ([place]) -stable at this time. Will increase frequency to BID but will notify if too costly. - memantine (NAMENDA) 10 MG tablet; Take 1 tablet (10 mg total) by mouth 2 (two) times daily.  Dispense: 60 tablet; Refill: 1  5. Advance care planning -MOST form completed and filed in vynca  - DNR (Do Not Resuscitate)  Next appt: 12/17/2019 as scheduled.  Phillip Larson. Harle Battiest  [facility] [phone]   Virtual Visit via Video Note  I connected with Milana Na on 07/09/19 at 11:00 AM EDT by a video enabled telemedicine application and verified that I am speaking with the correct person using two identifiers.  Location: Patient: home Provider: in office    I discussed the limitations of evaluation and management by telemedicine and the availability of in person appointments. The patient expressed understanding and agreed to proceed.    I discussed the assessment and treatment plan with the patient. The patient was provided an opportunity to ask questions and all were answered. The patient agreed with the plan and demonstrated an understanding of the instructions.   The patient was advised to call back or seek an in-person evaluation if the symptoms worsen or if the condition fails to improve as anticipated.  I provided 16 minutes of non-face-to-face time during this encounter.  Phillip Larson.  Dewaine Oats, AGNP Avs printed and mailed.

## 2019-07-22 ENCOUNTER — Other Ambulatory Visit: Payer: Self-pay | Admitting: Nurse Practitioner

## 2019-07-22 DIAGNOSIS — F5101 Primary insomnia: Secondary | ICD-10-CM

## 2019-07-22 NOTE — Telephone Encounter (Signed)
Patient wife requested refill on Alprazolam Phoned to pharmacy.

## 2019-07-22 NOTE — Telephone Encounter (Signed)
RX last filled 06/14/2019   Non opioid treatment agreement on file from 04/05/2020

## 2019-08-10 ENCOUNTER — Other Ambulatory Visit: Payer: Self-pay | Admitting: Nurse Practitioner

## 2019-08-10 DIAGNOSIS — F5101 Primary insomnia: Secondary | ICD-10-CM

## 2019-09-01 ENCOUNTER — Other Ambulatory Visit: Payer: Self-pay | Admitting: Nurse Practitioner

## 2019-09-01 DIAGNOSIS — F5101 Primary insomnia: Secondary | ICD-10-CM

## 2019-09-01 NOTE — Telephone Encounter (Signed)
Last OV 07/09/19, last refill 07/22/19

## 2019-09-01 NOTE — Telephone Encounter (Signed)
Vickii Chafe, wife called and requested refill [facility] Verified LR: 07/22/2019 Contract updated Pended Rx and sent to [place] for approval.

## 2019-09-09 ENCOUNTER — Other Ambulatory Visit: Payer: Self-pay | Admitting: Nurse Practitioner

## 2019-09-09 DIAGNOSIS — F5101 Primary insomnia: Secondary | ICD-10-CM

## 2019-09-09 NOTE — Telephone Encounter (Signed)
Phillip Larson, wife requested refill. Phoned to pharmacy.

## 2019-09-24 ENCOUNTER — Other Ambulatory Visit: Payer: Self-pay | Admitting: Nurse Practitioner

## 2019-09-24 DIAGNOSIS — F5104 Psychophysiologic insomnia: Secondary | ICD-10-CM

## 2019-09-24 NOTE — Telephone Encounter (Signed)
Patient is requesting a refill on Trazodone 100mg . Patient last office visit was 07/09/2019. Patient has upcoming appointment on 12/17/2019. Patient last refill was 02/18/2019. Patient has non opioid contract on file. Please Advise. Medication pend and sent to Marlowe Sax, NP. Due to PCP Lauree Chandler, NP being out of office.

## 2019-10-07 ENCOUNTER — Other Ambulatory Visit: Payer: Self-pay | Admitting: *Deleted

## 2019-10-07 DIAGNOSIS — F5101 Primary insomnia: Secondary | ICD-10-CM

## 2019-10-07 MED ORDER — ZOLPIDEM TARTRATE 5 MG PO TABS: 5.0000 mg | ORAL_TABLET | Freq: Every evening | ORAL | 0 refills | Status: DC | PRN

## 2019-10-07 MED ORDER — ALPRAZOLAM 0.5 MG PO TABS: ORAL_TABLET | ORAL | 0 refills | Status: DC

## 2019-10-07 NOTE — Telephone Encounter (Signed)
Patient wife called requesting refill.  Stated that the Zolpidem is 2 days early but that is because they are going on vacation tomorrow to the beach and needs to take the Rx with them.  Pended Rx's and sent to [facility] for approval.

## 2019-11-04 ENCOUNTER — Other Ambulatory Visit: Payer: Self-pay | Admitting: Cardiovascular Disease

## 2019-11-05 NOTE — Progress Notes (Signed)
Patient ID: Phillip Larson, male   DOB: 1934/09/17, 85 y.o.   MRN: [id number]     85 y.o. followup HTN, HLD  and CAD.  He has CAD with a total RCA that is collaterized.   Also with PVD seeing Dr Scot Dock with right calf claudication ABI right 0.59  Cath done 01/28/13 from right radial approach and no changes with chronically occluded RCA , robust collaterals and no significant left sided disease Echo reviewed from Sep 13 2016 EF 45-50% mild AR And trivial MR  Subacromial bursitis left shoulder sees [facility]  Alzheimers's started on aricept and namenda  Wife had surgery for her stomach cancer Has lost weight but caught it early He has limited his walking due to claudication and right knee arthritis  No angina   Has had vaccine for COVID  ROS: Denies fever, malais, weight loss, blurry vision, decreased visual acuity, cough, sputum, SOB, hemoptysis, pleuritic pain, palpitaitons, heartburn, abdominal pain, melena, lower extremity edema, claudication, or rash.  All other systems reviewed and negative  There were no vitals taken for this visit. Affect appropriate Healthy:  appears stated age 65: normal Neck supple with no adenopathy JVP normal no bruits no thyromegaly Lungs clear with no wheezing and good diaphragmatic motion Heart:  S1/S2 SEM  murmur, no rub, gallop or click PMI normal Abdomen: benighn, BS positve, no tenderness, no AAA no bruit.  No HSM or HJR Decreased right DP/PT pulses  No edema Neuro non-focal Skin warm and dry No muscular weakness    Current Outpatient Medications  Medication Sig Dispense Refill   traZODone (DESYREL) 100 MG tablet TAKE 1 TABLET BY MOUTH AT BEDTIME 90 tablet 0   ALPRAZolam (XANAX) 0.5 MG tablet Take one tablet by mouth once daily as needed 30 tablet 0   aspirin EC 81 MG tablet Take 81 mg by mouth at bedtime.     b complex vitamins tablet Take 1 tablet by mouth daily.     CARTIA XT 240 MG 24 hr capsule Take 1 capsule by mouth once  daily 90 capsule 3   cetirizine (ZYRTEC) 10 MG tablet Take 1 tablet (10 mg total) by mouth daily. 30 tablet 11   cholecalciferol (VITAMIN D) 1000 UNITS tablet Take 1,000 Units by mouth daily.     fluticasone (FLONASE) 50 MCG/ACT nasal spray Place 1 spray into both nostrils 2 (two) times daily as needed for allergies or rhinitis.     galantamine (RAZADYNE ER) 24 MG 24 hr capsule Take one capsule by mouth once daily with breakfast 90 capsule 1   isosorbide mononitrate (IMDUR) 30 MG 24 hr tablet Take 1 tablet (30 mg total) by mouth daily. MUST KEEP 11/12/19 APPT WITH DR Johnsie Cancel TO CONT REFILLS. Thank you. 30 tablet 0   levothyroxine (SYNTHROID) 88 MCG tablet TAKE 1 TABLET BY MOUTH ONCE DAILY ON AN EMPTY STOMACH 30 MINUTES BEFORE BREAKFAST 90 tablet 1   lovastatin (MEVACOR) 40 MG tablet Take 1 tablet (40 mg total) by mouth at bedtime. 90 tablet 1   memantine (NAMENDA) 10 MG tablet Take 1 tablet (10 mg total) by mouth 2 (two) times daily. 60 tablet 1   mirabegron ER (MYRBETRIQ) 50 MG TB24 tablet Take 1 tablet (50 mg total) by mouth every other day. 30 tablet 2   Multiple Vitamin (MULTIVITAMIN WITH MINERALS) TABS tablet Take 1 tablet by mouth daily.     nitroGLYCERIN (NITROSTAT) 0.4 MG SL tablet One under the tongue if needed for chest tightness. Repeat in  5 minutes if needed. Repeat again in 5 minutes if needed. 25 tablet 12   sertraline (ZOLOFT) 50 MG tablet Take 1 tablet (50 mg total) by mouth daily. 90 tablet 1   tamsulosin (FLOMAX) 0.4 MG CAPS capsule TAKE ONE CAPSULE BY MOUTH ONCE DAILY TO HELP RELIEVE OBSTRUCTION 90 capsule 1   vitamin C (ASCORBIC ACID) 500 MG tablet Take 500 mg by mouth daily.     zinc gluconate 50 MG tablet Take 50 mg by mouth at bedtime.     zolpidem (AMBIEN) 5 MG tablet Take 1 tablet (5 mg total) by mouth at bedtime as needed. for sleep 30 tablet 0   No current facility-administered medications for this visit.    Allergies  Iodine and  Iohexol  Electrocardiogram:    09/02/16 SB rate 3 possible old IMI PVC 03/31/19 SR rate 61 PVC normal   Assessment and Plan  CAD:  Chronically occluded RCA , robust collaterals and no significant left sided disease Stable with no angina and good activity level.  Continue medical Rx EF 45-50% by echo 09/13/16    Chol:  On statin Cholesterol is at [facility] 12/11/18     Dementia:  Started on namenda dose cut back due to cost and aricept f/u Dr Nyoka Cowden Also Rx Zoloft for depression and ambien for insomnia   HTN:  Well controlled.  Continue current medications and low sodium Dash type diet.    Prostate:  On proscar f/u primary for PSA    Thyroid:  On replacement TSH normal 05/29/19 at 2.66   Bursitis:  Left shoulder f/u Caffry   Fatigue/Murmur: Echo 09/13/16 trivial MR AV sclerosis and mild AR stable   PVD:  F/u Dr Scot Dock right calf claudication  12/17/18 ABI 0.59 His note indicated no intervention unless he developes severe claudication , rest pain or ulcer Did not mention Pletal but with CAD not likely a good candidate he is limited in walking but hard to tell what is claudication and what is arthritis in his right knee. F/U with [facility] in 6 months   Note regarding Advance care planning patient is DNR          Jenkins Rouge, MD

## 2019-11-09 ENCOUNTER — Other Ambulatory Visit: Payer: Self-pay | Admitting: *Deleted

## 2019-11-09 DIAGNOSIS — E782 Mixed hyperlipidemia: Secondary | ICD-10-CM

## 2019-11-09 DIAGNOSIS — F5101 Primary insomnia: Secondary | ICD-10-CM

## 2019-11-09 MED ORDER — ZOLPIDEM TARTRATE 5 MG PO TABS: 5.0000 mg | ORAL_TABLET | Freq: Every evening | ORAL | 0 refills | Status: DC | PRN

## 2019-11-09 MED ORDER — LOVASTATIN 40 MG PO TABS: 40.0000 mg | ORAL_TABLET | Freq: Every day | ORAL | 1 refills | Status: DC

## 2019-11-09 NOTE — Telephone Encounter (Signed)
Patient wife requested refill.  Pended Rx and sent to [facility] for approval.  Epic LR: 10/07/2019

## 2019-11-12 ENCOUNTER — Ambulatory Visit (INDEPENDENT_AMBULATORY_CARE_PROVIDER_SITE_OTHER): Payer: Medicare Other | Admitting: Cardiovascular Disease

## 2019-11-12 ENCOUNTER — Other Ambulatory Visit: Payer: Self-pay

## 2019-11-12 ENCOUNTER — Encounter: Payer: Self-pay | Admitting: Cardiovascular Disease

## 2019-11-12 VITALS — BP 104/60 | HR 78 | Ht 70.0 in | Wt 184.0 lb

## 2019-11-12 DIAGNOSIS — I739 Peripheral vascular disease, unspecified: Secondary | ICD-10-CM | POA: Diagnosis not present

## 2019-11-12 DIAGNOSIS — I2510 Atherosclerotic heart disease of native coronary artery without angina pectoris: Secondary | ICD-10-CM | POA: Diagnosis not present

## 2019-11-12 NOTE — Patient Instructions (Signed)
Medication Instructions:  *If you need a refill on your cardiac medications before your next appointment, please call your pharmacy*   Lab Work: If you have labs (blood work) drawn today and your tests are completely normal, you will receive your results only by: [name] MyChart Message (if you have MyChart) OR . A paper copy in the mail If you have any lab test that is abnormal or we need to change your treatment, we will call you to review the results.  Testing/Procedures: Your physician has requested that you have a lower extremity arterial duplex with ABIs During this test, exercise and ultrasound are used to evaluate arterial blood flow in the legs. Allow one hour for this exam. There are no restrictions or special instructions.  Follow-Up: At [facility], you and your health needs are our priority.  As part of our continuing mission to provide you with exceptional heart care, we have created designated Provider Care Teams.  These Care Teams include your primary Cardiologist (physician) and Advanced Practice Providers (APPs -  Physician Assistants and Nurse Practitioners) who all work together to provide you with the care you need, when you need it.  We recommend signing up for the patient portal called "MyChart".  Sign up information is provided on this After Visit Summary.  MyChart is used to connect with patients for Virtual Visits (Telemedicine).  Patients are able to view lab/test results, encounter notes, upcoming appointments, etc.  Non-urgent messages can be sent to your provider as well.   To learn more about what you can do with MyChart, go to [email or web address].    Your next appointment:   6 month(s)  The format for your next appointment:   In Person  Provider:   You may see Jenkins Rouge, MD or one of the following Advanced Practice Providers on your designated Care Team:    Truitt Merle, NP  Cecilie Kicks, NP  Kathyrn Drown, NP

## 2019-11-15 ENCOUNTER — Other Ambulatory Visit (HOSPITAL_COMMUNITY): Payer: Self-pay | Admitting: Cardiovascular Disease

## 2019-11-15 DIAGNOSIS — I739 Peripheral vascular disease, unspecified: Secondary | ICD-10-CM

## 2019-11-24 ENCOUNTER — Other Ambulatory Visit: Payer: Self-pay | Admitting: *Deleted

## 2019-11-24 DIAGNOSIS — F5101 Primary insomnia: Secondary | ICD-10-CM

## 2019-11-24 MED ORDER — ALPRAZOLAM 0.5 MG PO TABS: ORAL_TABLET | ORAL | 0 refills | Status: DC

## 2019-11-24 NOTE — Telephone Encounter (Signed)
Patient wife requested refill Epic LR: 10/07/2019 Contract updated Pended Rx and sent to [facility] for approval.

## 2019-12-02 ENCOUNTER — Encounter: Payer: Self-pay | Admitting: Nurse Practitioner

## 2019-12-02 ENCOUNTER — Other Ambulatory Visit: Payer: Self-pay

## 2019-12-02 ENCOUNTER — Ambulatory Visit (INDEPENDENT_AMBULATORY_CARE_PROVIDER_SITE_OTHER): Payer: Medicare Other | Admitting: Nurse Practitioner

## 2019-12-02 ENCOUNTER — Telehealth: Payer: Self-pay

## 2019-12-02 DIAGNOSIS — Z0000 Encounter for general adult medical examination without abnormal findings: Secondary | ICD-10-CM | POA: Diagnosis not present

## 2019-12-02 NOTE — Telephone Encounter (Signed)
[name], latouche are scheduled for a virtual visit with your provider today.    Just as we do with appointments in the office, we must obtain your consent to participate.  Your consent will be active for this visit and any virtual visit you may have with one of our providers in the next 365 days.    If you have a MyChart account, I can also send a copy of this consent to you electronically.  All virtual visits are billed to your insurance company just like a traditional visit in the office.  As this is a virtual visit, video technology does not allow for your provider to perform a traditional examination.  This may limit your provider's ability to fully assess your condition.  If your provider identifies any concerns that need to be evaluated in person or the need to arrange testing such as labs, EKG, etc, we will make arrangements to do so.    Although advances in technology are sophisticated, we cannot ensure that it will always work on either your end or our end.  If the connection with a video visit is poor, we may have to switch to a telephone visit.  With either a video or telephone visit, we are not always able to ensure that we have a secure connection.   I need to obtain your verbal consent now.   Are you willing to proceed with your visit today?   Phillip Larson has provided verbal consent on 12/02/2019 for a virtual visit (video or telephone).   Carroll Kinds, [facility] 12/02/2019  2:57 PM

## 2019-12-02 NOTE — Progress Notes (Signed)
This service is provided via telemedicine  No vital signs collected/recorded due to the encounter was a telemedicine visit.   Location of patient (ex: home, work):  Home  Patient consents to a telephone visit:  Yes, see encounter dated 12/02/2019  Location of the provider (ex: office, home):  [place]  Name of any referring provider:  N/A  Names of all persons participating in the telemedicine service and their role in the encounter:  Sherrie Mustache, Nurse Practitioner, Carroll Kinds, CMA, and patient.   Time spent on call:  7 minutes with medical assistant.

## 2019-12-02 NOTE — Progress Notes (Signed)
Subjective:   Phillip Larson is a 85 y.o. male who presents for Medicare Annual/Subsequent preventive examination.  Review of Systems     Cardiac Risk Factors include: advanced age (>60men, >71 women);hypertension;dyslipidemia;male gender;sedentary lifestyle     Objective:    There were no vitals filed for this visit. There is no height or weight on file to calculate BMI.  Advanced Directives 12/02/2019 06/18/2019 02/03/2019 12/10/2018 07/03/2018 06/11/2018 06/11/2018  Does Patient Have a Medical Advance Directive? Yes Yes No Yes No No No  Type of Advance Directive Out of facility DNR (pink MOST or yellow form);[place];Living will [place];Living will - [place];Living will - - -  Does patient want to make changes to medical advance directive? No - Patient declined No - Patient declined - No - Patient declined No - Patient declined Yes (MAU/Ambulatory/Procedural Areas - Information given) Yes (MAU/Ambulatory/Procedural Areas - Information given)  Copy of [place] in Chart? No - copy requested Yes - validated most recent copy scanned in chart (See row information) - No - copy requested - - -  Would patient like information on creating a medical advance directive? - - No - Patient declined - - - -  Pre-existing out of facility DNR order (yellow form or pink MOST form) Yellow form placed in chart (order not valid for inpatient use);Pink MOST form placed in chart (order not valid for inpatient use) - - - - - -    Current Medications (verified) Outpatient Encounter Medications as of 12/02/2019  Medication Sig  . ALPRAZolam (XANAX) 0.5 MG tablet Take one tablet by mouth once daily as needed (Patient taking differently: Take one tablet by mouth once daily as needed. 1/2 tablet as needed)  . aspirin EC 81 MG tablet Take 81 mg by mouth at bedtime.  [name] b complex vitamins tablet Take 1 tablet by mouth daily.  [name] CARTIA XT  240 MG 24 hr capsule Take 1 capsule by mouth once daily  . cetirizine (ZYRTEC) 10 MG tablet Take 1 tablet (10 mg total) by mouth daily.  . cholecalciferol (VITAMIN D) 1000 UNITS tablet Take 1,000 Units by mouth daily.  . fluticasone (FLONASE) 50 MCG/ACT nasal spray Place 1 spray into both nostrils 2 (two) times daily as needed for allergies or rhinitis.  [name] galantamine (RAZADYNE ER) 24 MG 24 hr capsule Take one capsule by mouth once daily with breakfast  . isosorbide mononitrate (IMDUR) 30 MG 24 hr tablet Take 1 tablet (30 mg total) by mouth daily. MUST KEEP 11/12/19 APPT WITH DR Johnsie Cancel TO CONT REFILLS. Thank you.  . levothyroxine (SYNTHROID) 88 MCG tablet TAKE 1 TABLET BY MOUTH ONCE DAILY ON AN EMPTY STOMACH 30 MINUTES BEFORE BREAKFAST  . lovastatin (MEVACOR) 40 MG tablet Take 1 tablet (40 mg total) by mouth at bedtime.  . memantine (NAMENDA) 10 MG tablet Take 1 tablet (10 mg total) by mouth 2 (two) times daily.  . mirabegron ER (MYRBETRIQ) 50 MG TB24 tablet Take 1 tablet (50 mg total) by mouth every other day.  . Multiple Vitamin (MULTIVITAMIN WITH MINERALS) TABS tablet Take 1 tablet by mouth daily.  . nitroGLYCERIN (NITROSTAT) 0.4 MG SL tablet One under the tongue if needed for chest tightness. Repeat in 5 minutes if needed. Repeat again in 5 minutes if needed.  . sertraline (ZOLOFT) 50 MG tablet Take 1 tablet (50 mg total) by mouth daily.  . tamsulosin (FLOMAX) 0.4 MG CAPS capsule TAKE ONE  CAPSULE BY MOUTH ONCE DAILY TO HELP RELIEVE OBSTRUCTION  . traZODone (DESYREL) 100 MG tablet TAKE 1 TABLET BY MOUTH AT BEDTIME  . vitamin C (ASCORBIC ACID) 500 MG tablet Take 500 mg by mouth daily.  [name] zinc gluconate 50 MG tablet Take 50 mg by mouth at bedtime.  [name] zolpidem (AMBIEN) 5 MG tablet Take 1 tablet (5 mg total) by mouth at bedtime as needed. for sleep   No facility-administered encounter medications on file as of 12/02/2019.    Allergies (verified) Iodine and Iohexol   History: Past Medical  History:  Diagnosis Date  . Actinic keratosis   . Alzheimer's disease ([place])   . Anal fissure   . Anxiety state, unspecified   . Benign neoplasm of colon   . BENIGN PROSTATIC HYPERTROPHY, HX OF   . BPH (benign prostatic hypertrophy) with urinary obstruction 06/22/2008   Qualifier: Diagnosis of  By: Johnsie Cancel, MD, Rona Ravens   . Cerebrovascular disease, unspecified   . Coronary atherosclerosis of native coronary artery   . Depressive disorder, not elsewhere classified   . Diverticulosis of colon (without mention of hemorrhage)   . Essential hypertension, benign   . External hemorrhoids without mention of complication   . Insomnia, unspecified   . Mixed hyperlipidemia   . Osteoarthrosis, unspecified whether generalized or localized, unspecified site   . Other premature beats   . Other psoriasis   . Pain in joint, lower leg   . Pain in joint, site unspecified   . Palpitations   . Pressure ulcer   . Reflux esophagitis   . Unspecified essential hypertension   . Unspecified hereditary and idiopathic peripheral neuropathy   . Unspecified hypothyroidism   . Unspecified vitamin D deficiency    Past Surgical History:  Procedure Laterality Date  . CERVICAL DISC SURGERY    . EYE SURGERY Left 02/07/2014   Cataract Surgery Left Eye  . KNEE SURGERY  07/18/2006   Dr French Ana   . LAMINECTOMY C3 disc  1972  . LEFT HEART CATHETERIZATION WITH CORONARY ANGIOGRAM N/A 01/28/2013   Procedure: LEFT HEART CATHETERIZATION WITH CORONARY ANGIOGRAM;  Surgeon: Josue Hector, MD;  [facility] CATH LAB;  Service: Cardiovascular;  Laterality: N/A;  . MULTIPLE TOOTH EXTRACTIONS  2014  . RETINAL TEAR REPAIR CRYOTHERAPY     Dr Wayne Sever    Family History  Problem Relation Age of Onset  . Cancer Father        pancreatic  . Cancer Mother        pancreatic   Social History   Socioeconomic History  . Marital status: Married    Spouse name: Not on file  . Number of children: Not on file  . Years of  education: Not on file  . Highest education level: Not on file  Occupational History  . Occupation: retired  Tobacco Use  . Smoking status: Former Smoker    Packs/day: 2.00    Years: 25.00    Pack years: 50.00    Types: Cigarettes    Quit date: 04/22/1992    Years since quitting: 27.6  . Smokeless tobacco: Never Used  Vaping Use  . Vaping Use: Never used  Substance and Sexual Activity  . Alcohol use: Yes    Alcohol/week: 0.0 standard drinks    Comment: beer/wine/liquor daily  . Drug use: No  . Sexual activity: Not Currently  Other Topics Concern  . Not on file  Social History Narrative  . Not on file  Social Determinants of Health   Financial Resource Strain:   . Difficulty of Paying Living Expenses:   Food Insecurity:   . Worried About Charity fundraiser in the Last Year:   . Arboriculturist in the Last Year:   Transportation Needs:   . Film/video editor (Medical):   [name] Lack of Transportation (Non-Medical):   Physical Activity:   . Days of Exercise per Week:   . Minutes of Exercise per Session:   Stress:   . Feeling of Stress :   Social Connections:   . Frequency of Communication with Friends and Family:   . Frequency of Social Gatherings with Friends and Family:   . Attends Religious Services:   . Active Member of Clubs or Organizations:   . Attends Archivist Meetings:   [name] Marital Status:     Tobacco Counseling Counseling given: Not Answered   Clinical Intake:     Pain : No/denies pain     BMI - recorded: 26 Nutritional Status: BMI 25 -29 Overweight Nutritional Risks: None Diabetes: No  How often do you need to have someone help you when you read instructions, pamphlets, or other written materials from your doctor or pharmacy?: 1 - Never  Diabetic?no         Activities of Daily Living In your present state of health, do you have any difficulty performing the following activities: 12/02/2019  Hearing? N  Vision? Y    Difficulty concentrating or making decisions? N  Walking or climbing stairs? N  Dressing or bathing? N  Doing errands, shopping? N  Preparing Food and eating ? N  Using the Toilet? N  In the past six months, have you accidently leaked urine? Y  Do you have problems with loss of bowel control? N  Managing your Medications? N  Managing your Finances? N  Housekeeping or managing your Housekeeping? N  Some recent data might be hidden    Patient Care Team: Lauree Chandler, NP as PCP - General (Geriatric Medicine) Josue Hector, MD as PCP - Cardiology (Cardiology) Clent Jacks, MD as Consulting Physician (Ophthalmology) Lamonte Sakai Rose Fillers, MD as Consulting Physician (Pulmonary Disease) Angelia Mould, MD as Consulting Physician (Vascular Surgery) Earlie Server, MD as Consulting Physician (Orthopedic Surgery) Wilford Corner, MD as Consulting Physician (Gastroenterology) Chesley Mires, MD as Consulting Physician (Pulmonary Disease)  Indicate any recent Medical Services you may have received from other than [facility] providers in the past year (date may be approximate).     Assessment:   This is a routine wellness examination for Esa.  Hearing/Vision screen  Hearing Screening   125Hz  250Hz  500Hz  1000Hz  2000Hz  3000Hz  4000Hz  6000Hz  8000Hz   Right ear:           Left ear:           Comments: Patient states he has no hearing problems  Vision Screening Comments: Patient wears reading glasses. Patient has had an eye exam within the past year.  Dietary issues and exercise activities discussed: Current Exercise Habits: The patient does not participate in regular exercise at present  Goals    . Exercise 3x per week (30 min per time)     Pt would like to walk more (daily)    . Increase water intake     Starting 05/21/16, I will attempt to increase my water intake from 1 glass of water per day to 3 glasses per day.       Depression Screen  PHQ 2/9 Scores 12/02/2019  06/18/2019 12/09/2017 06/06/2017 06/04/2017 05/21/2016 11/29/2015  PHQ - 2 Score 0 0 6 0 0 0 0  PHQ- 9 Score - - 12 - - - -    Fall Risk Fall Risk  12/02/2019 06/18/2019 04/02/2019 12/10/2018 07/03/2018  Falls in the past year? 0 0 0 1 0  Number falls in past yr: 0 0 0 1 0  Comment - - - - -  Injury with Fall? 0 0 0 0 0  Comment - - - - -  Risk for fall due to : - - - History of fall(s) -    Any stairs in or around the home? Yes  If so, are there any without handrails? No  Home free of loose throw rugs in walkways, pet beds, electrical cords, etc? Yes  Adequate lighting in your home to reduce risk of falls? Yes   ASSISTIVE DEVICES UTILIZED TO PREVENT FALLS:  Life alert? No  Use of a cane, walker or w/c? No  Grab bars in the bathroom? Yes  Shower chair or bench in shower? No  Elevated toilet seat or a handicapped toilet? No    Cognitive Function: MMSE - Mini Mental State Exam 06/11/2018 06/04/2017 05/21/2016 11/29/2015  Orientation to time 4 5 3 5   Orientation to Place 5 5 5 5   Registration 3 3 3 3   Attention/ Calculation 5 5 4 5   Recall 1 0 2 3  Language- name 2 objects 2 2 2 2   Language- repeat 1 1 1 1   Language- follow 3 step command 3 3 3 3   Language- read & follow direction 1 1 1 1   Write a sentence 1 1 1 1   Copy design 0 1 1 1   Total score 26 27 26 30      6CIT Screen 12/02/2019  What Year? 0 points  What month? 0 points  What time? 0 points  Count back from 20 0 points  Months in reverse 0 points  Repeat phrase 8 points  Total Score 8    Immunizations Immunization History  Administered Date(s) Administered  . Fluad Quad(high Dose 65+) 12/10/2018  . Influenza, High Dose Seasonal PF 06/11/2018  . Influenza,inj,Quad PF,6+ Mos 03/03/2013, 05/24/2015, 05/21/2016  . PFIZER SARS-COV-2 Vaccination 05/18/2019, 06/08/2019  . Pneumococcal Conjugate-13 05/21/2016  . Pneumococcal Polysaccharide-23 11/14/1998  . Td 11/14/1998, 12/09/2017  . Tdap 06/18/2011  . Zoster 05/27/2007     TDAP status: Up to date Flu Vaccine status: Up to date  Recommended to get pnemomnia 23 at next OV Qualifies for Shingles Vaccine? Yes   Zostavax completed Yes   Shingrix Completed?: No.    Education has been provided regarding the importance of this vaccine. Patient has been advised to call insurance company to determine out of pocket expense if they have not yet received this vaccine. Advised may also receive vaccine at local pharmacy or Health Dept. Verbalized acceptance and understanding.  Screening Tests Health Maintenance  Topic Date Due  . PNA vac Low Risk Adult (2 of 2 - PPSV23) 05/21/2017  . INFLUENZA VACCINE  11/21/2019  . TETANUS/TDAP  12/10/2027  . COVID-19 Vaccine  Completed    Health Maintenance  Health Maintenance Due  Topic Date Due  . PNA vac Low Risk Adult (2 of 2 - PPSV23) 05/21/2017  . INFLUENZA VACCINE  11/21/2019    Colorectal cancer screening: No longer required.   Lung Cancer Screening: (Low Dose CT Chest recommended if Age 33-80 years, 30 pack-year currently smoking  OR have quit w/in 15years.) does not qualify.   Lung Cancer Screening Referral: na  Additional Screening:  Hepatitis C Screening: does not qualify; Completed na  Vision Screening: Recommended annual ophthalmology exams for early detection of glaucoma and other disorders of the eye. Is the patient up to date with their annual eye exam?  Yes  Who is the provider or what is the name of the office in which the patient attends annual eye exams? [facility] If pt is not established with a provider, would they like to be referred to a provider to establish care? No .   Dental Screening: Recommended annual dental exams for proper oral hygiene  Community Resource Referral / Chronic Care Management: CRR required this visit?  No   CCM required this visit?  No      Plan:     I have personally reviewed and noted the following in the patient's chart:   . Medical and social history . Use of  alcohol, tobacco or illicit drugs  . Current medications and supplements . Functional ability and status . Nutritional status . Physical activity . Advanced directives . List of other physicians . Hospitalizations, surgeries, and ER visits in previous 12 months . Vitals . Screenings to include cognitive, depression, and falls . Referrals and appointments  In addition, I have reviewed and discussed with patient certain preventive protocols, quality metrics, and best practice recommendations. A written personalized care plan for preventive services as well as general preventive health recommendations were provided to patient.     Lauree Chandler, NP   12/02/2019

## 2019-12-02 NOTE — Patient Instructions (Signed)
[name] , Thank you for taking time to come for your Medicare Wellness Visit. I appreciate your ongoing commitment to your health goals. Please review the following plan we discussed and let me know if I can assist you in the future.   Screening recommendations/referrals: Colonoscopy aged out Recommended yearly ophthalmology/optometry visit for glaucoma screening and checkup Recommended yearly dental visit for hygiene and checkup  Vaccinations: Influenza vaccine DUE now, to get in office or at local pharmacy Pneumococcal vaccine -DUE for Pneumonia 23 vaccine Tdap vaccine up to date Shingles vaccine to get [facility] vaccine at local pharmacy     Advanced directives: recommend to bring to office for [place] to place on file.   Conditions/risks identified: progressive memory loss, advanced age  Next appointment: 1 year for AWV  Preventive Care 51 Years and Older, Male Preventive care refers to lifestyle choices and visits with your health care provider that can promote health and wellness. What does preventive care include?  A yearly physical exam. This is also called an annual well check.  Dental exams once or twice a year.  Routine eye exams. Ask your health care provider how often you should have your eyes checked.  Personal lifestyle choices, including:  Daily care of your teeth and gums.  Regular physical activity.  Eating a healthy diet.  Avoiding tobacco and drug use.  Limiting alcohol use.  Practicing safe sex.  Taking low doses of aspirin every day.  Taking vitamin and mineral supplements as recommended by your health care provider. What happens during an annual well check? The services and screenings done by your health care provider during your annual well check will depend on your age, overall health, lifestyle risk factors, and family history of disease. Counseling  Your health care provider may ask you questions about your:  Alcohol use.  Tobacco  use.  Drug use.  Emotional well-being.  Home and relationship well-being.  Sexual activity.  Eating habits.  History of falls.  Memory and ability to understand (cognition).  Work and work Statistician. Screening  You may have the following tests or measurements:  Height, weight, and BMI.  Blood pressure.  Lipid and cholesterol levels. These may be checked every 5 years, or more frequently if you are over 57 years old.  Skin check.  Lung cancer screening. You may have this screening every year starting at age 13 if you have a 30-pack-year history of smoking and currently smoke or have quit within the past 15 years.  Fecal occult blood test (FOBT) of the stool. You may have this test every year starting at age 31.  Flexible sigmoidoscopy or colonoscopy. You may have a sigmoidoscopy every 5 years or a colonoscopy every 10 years starting at age 23.  Prostate cancer screening. Recommendations will vary depending on your family history and other risks.  Hepatitis C blood test.  Hepatitis B blood test.  Sexually transmitted disease (STD) testing.  Diabetes screening. This is done by checking your blood sugar (glucose) after you have not eaten for a while (fasting). You may have this done every 1-3 years.  Abdominal aortic aneurysm (AAA) screening. You may need this if you are a current or former smoker.  Osteoporosis. You may be screened starting at age 67 if you are at high risk. Talk with your health care provider about your test results, treatment options, and if necessary, the need for more tests. Vaccines  Your health care provider may recommend certain vaccines, such as:  Influenza vaccine.  This is recommended every year.  Tetanus, diphtheria, and acellular pertussis (Tdap, Td) vaccine. You may need a Td booster every 10 years.  Zoster vaccine. You may need this after age 67.  Pneumococcal 13-valent conjugate (PCV13) vaccine. One dose is recommended after age  71.  Pneumococcal polysaccharide (PPSV23) vaccine. One dose is recommended after age 86. Talk to your health care provider about which screenings and vaccines you need and how often you need them. This information is not intended to replace advice given to you by your health care provider. Make sure you discuss any questions you have with your health care provider. Document Released: 05/05/2015 Document Revised: 12/27/2015 Document Reviewed: 02/07/2015 Elsevier Interactive Patient Education  2017 [place] Prevention in the Home Falls can cause injuries. They can happen to people of all ages. There are many things you can do to make your home safe and to help prevent falls. What can I do on the outside of my home?  Regularly fix the edges of walkways and driveways and fix any cracks.  Remove anything that might make you trip as you walk through a door, such as a raised step or threshold.  Trim any bushes or trees on the path to your home.  Use bright outdoor lighting.  Clear any walking paths of anything that might make someone trip, such as rocks or tools.  Regularly check to see if handrails are loose or broken. Make sure that both sides of any steps have handrails.  Any raised decks and porches should have guardrails on the edges.  Have any leaves, snow, or ice cleared regularly.  Use sand or salt on walking paths during winter.  Clean up any spills in your garage right away. This includes oil or grease spills. What can I do in the bathroom?  Use night lights.  Install grab bars by the toilet and in the tub and shower. Do not use towel bars as grab bars.  Use non-skid mats or decals in the tub or shower.  If you need to sit down in the shower, use a plastic, non-slip stool.  Keep the floor dry. Clean up any water that spills on the floor as soon as it happens.  Remove soap buildup in the tub or shower regularly.  Attach bath mats securely with double-sided  non-slip rug tape.  Do not have throw rugs and other things on the floor that can make you trip. What can I do in the bedroom?  Use night lights.  Make sure that you have a light by your bed that is easy to reach.  Do not use any sheets or blankets that are too big for your bed. They should not hang down onto the floor.  Have a firm chair that has side arms. You can use this for support while you get dressed.  Do not have throw rugs and other things on the floor that can make you trip. What can I do in the kitchen?  Clean up any spills right away.  Avoid walking on wet floors.  Keep items that you use a lot in easy-to-reach places.  If you need to reach something above you, use a strong step stool that has a grab bar.  Keep electrical cords out of the way.  Do not use floor polish or wax that makes floors slippery. If you must use wax, use non-skid floor wax.  Do not have throw rugs and other things on the floor that can make  you trip. What can I do with my stairs?  Do not leave any items on the stairs.  Make sure that there are handrails on both sides of the stairs and use them. Fix handrails that are broken or loose. Make sure that handrails are as long as the stairways.  Check any carpeting to make sure that it is firmly attached to the stairs. Fix any carpet that is loose or worn.  Avoid having throw rugs at the top or bottom of the stairs. If you do have throw rugs, attach them to the floor with carpet tape.  Make sure that you have a light switch at the top of the stairs and the bottom of the stairs. If you do not have them, ask someone to add them for you. What else can I do to help prevent falls?  Wear shoes that:  Do not have high heels.  Have rubber bottoms.  Are comfortable and fit you well.  Are closed at the toe. Do not wear sandals.  If you use a stepladder:  Make sure that it is fully opened. Do not climb a closed stepladder.  Make sure that both  sides of the stepladder are locked into place.  Ask someone to hold it for you, if possible.  Clearly mark and make sure that you can see:  Any grab bars or handrails.  First and last steps.  Where the edge of each step is.  Use tools that help you move around (mobility aids) if they are needed. These include:  Canes.  Walkers.  Scooters.  Crutches.  Turn on the lights when you go into a dark area. Replace any light bulbs as soon as they burn out.  Set up your furniture so you have a clear path. Avoid moving your furniture around.  If any of your floors are uneven, fix them.  If there are any pets around you, be aware of where they are.  Review your medicines with your doctor. Some medicines can make you feel dizzy. This can increase your chance of falling. Ask your doctor what other things that you can do to help prevent falls. This information is not intended to replace advice given to you by your health care provider. Make sure you discuss any questions you have with your health care provider. Document Released: 02/02/2009 Document Revised: 09/14/2015 Document Reviewed: 05/13/2014 Elsevier Interactive Patient Education  2017 [employer].

## 2019-12-09 ENCOUNTER — Other Ambulatory Visit: Payer: Self-pay | Admitting: *Deleted

## 2019-12-09 DIAGNOSIS — F5101 Primary insomnia: Secondary | ICD-10-CM

## 2019-12-09 MED ORDER — ZOLPIDEM TARTRATE 5 MG PO TABS: 5.0000 mg | ORAL_TABLET | Freq: Every evening | ORAL | 0 refills | Status: DC | PRN

## 2019-12-09 NOTE — Telephone Encounter (Signed)
Patient wife requested refill Epic LR: 11/09/2019 Contract updated Pended Rx and sent to [facility] for approval.

## 2019-12-10 ENCOUNTER — Encounter: Payer: Self-pay | Admitting: Nurse Practitioner

## 2019-12-10 ENCOUNTER — Other Ambulatory Visit: Payer: Self-pay

## 2019-12-10 ENCOUNTER — Ambulatory Visit (INDEPENDENT_AMBULATORY_CARE_PROVIDER_SITE_OTHER): Payer: Medicare Other | Admitting: Nurse Practitioner

## 2019-12-10 VITALS — BP 116/64 | HR 96 | Temp 96.4°F | Ht 70.0 in | Wt 181.8 lb

## 2019-12-10 DIAGNOSIS — E039 Hypothyroidism, unspecified: Secondary | ICD-10-CM | POA: Diagnosis not present

## 2019-12-10 DIAGNOSIS — I10 Essential (primary) hypertension: Secondary | ICD-10-CM

## 2019-12-10 DIAGNOSIS — F411 Generalized anxiety disorder: Secondary | ICD-10-CM

## 2019-12-10 DIAGNOSIS — I739 Peripheral vascular disease, unspecified: Secondary | ICD-10-CM | POA: Diagnosis not present

## 2019-12-10 DIAGNOSIS — Z23 Encounter for immunization: Secondary | ICD-10-CM

## 2019-12-10 DIAGNOSIS — F5101 Primary insomnia: Secondary | ICD-10-CM

## 2019-12-10 DIAGNOSIS — N400 Enlarged prostate without lower urinary tract symptoms: Secondary | ICD-10-CM

## 2019-12-10 DIAGNOSIS — R972 Elevated prostate specific antigen [PSA]: Secondary | ICD-10-CM

## 2019-12-10 DIAGNOSIS — E782 Mixed hyperlipidemia: Secondary | ICD-10-CM

## 2019-12-10 MED ORDER — ALPRAZOLAM 0.25 MG PO TABS: 0.1250 mg | ORAL_TABLET | Freq: Every evening | ORAL | 0 refills | Status: DC | PRN

## 2019-12-10 NOTE — Progress Notes (Signed)
Careteam: Patient Care Team: Lauree Chandler, NP as PCP - General (Geriatric Medicine) Josue Hector, MD as PCP - Cardiology (Cardiology) Clent Jacks, MD as Consulting Physician (Ophthalmology) Lamonte Sakai Rose Fillers, MD as Consulting Physician (Pulmonary Disease) Angelia Mould, MD as Consulting Physician (Vascular Surgery) Earlie Server, MD as Consulting Physician (Orthopedic Surgery) Wilford Corner, MD as Consulting Physician (Gastroenterology) Chesley Mires, MD as Consulting Physician (Pulmonary Disease)  PLACE OF SERVICE:  [facility]  Advanced Directive information Does Patient Have a Medical Advance Directive?: Yes, Type of Advance Directive: Healthcare Power of [place];Living will;Out of facility DNR (pink MOST or yellow form), Pre-existing out of facility DNR order (yellow form or pink MOST form): Yellow form placed in chart (order not valid for inpatient use);Pink MOST form placed in chart (order not valid for inpatient use), Does patient want to make changes to medical advance directive?: No - Patient declined  Allergies  Allergen Reactions  . Iodine Swelling    Internal iodine  . Iohexol Hives         Chief Complaint  Patient presents with  . Medical Management of Chronic Issues    6 month follow-up      HPI: Patient is a 85 y.o. male for routine follow up.  Anxiety/depression- stable. Using alprazolam for sleep -denies anxiety Reports he is not having any trouble sleeping.  May have 1 bad night a month.   Insomnia- continues on Ambien and trazodone.   Could not keep up with tennis anymore so he does not play. Needs to walk more.   PVD- followed by vascular, continues to have issues with claudication but reports overall he feels like it is better.   CAD- no chest pains.   BPH- stable, gets up one time at night to urinate.   hyperlipidemia continues on lovastatin- not fasting but would like to get blood work.    Review of Systems:    Review of Systems  Constitutional: Negative for chills, fever and weight loss.  HENT: Negative for tinnitus.   Respiratory: Negative for cough, sputum production and shortness of breath.   Cardiovascular: Negative for chest pain, palpitations and leg swelling.  Gastrointestinal: Negative for abdominal pain, constipation, diarrhea and heartburn.  Genitourinary: Negative for dysuria, frequency and urgency.  Musculoskeletal: Negative for back pain, falls, joint pain and myalgias.  Skin: Negative.   Neurological: Negative for dizziness and headaches.  Psychiatric/Behavioral: Positive for memory loss. Negative for depression. The patient does not have insomnia.     Past Medical History:  Diagnosis Date  . Actinic keratosis   . Alzheimer's disease ([place])   . Anal fissure   . Anxiety state, unspecified   . Benign neoplasm of colon   . BENIGN PROSTATIC HYPERTROPHY, HX OF   . BPH (benign prostatic hypertrophy) with urinary obstruction 06/22/2008   Qualifier: Diagnosis of  By: Johnsie Cancel, MD, Rona Ravens   . Cerebrovascular disease, unspecified   . Coronary atherosclerosis of native coronary artery   . Depressive disorder, not elsewhere classified   . Diverticulosis of colon (without mention of hemorrhage)   . Essential hypertension, benign   . External hemorrhoids without mention of complication   . Insomnia, unspecified   . Mixed hyperlipidemia   . Osteoarthrosis, unspecified whether generalized or localized, unspecified site   . Other premature beats   . Other psoriasis   . Pain in joint, lower leg   . Pain in joint, site unspecified   . Palpitations   . Pressure  ulcer   . Reflux esophagitis   . Unspecified essential hypertension   . Unspecified hereditary and idiopathic peripheral neuropathy   . Unspecified hypothyroidism   . Unspecified vitamin D deficiency    Past Surgical History:  Procedure Laterality Date  . CERVICAL DISC SURGERY    . EYE SURGERY Left 02/07/2014    Cataract Surgery Left Eye  . KNEE SURGERY  07/18/2006   Dr French Ana   . LAMINECTOMY C3 disc  1972  . LEFT HEART CATHETERIZATION WITH CORONARY ANGIOGRAM N/A 01/28/2013   Procedure: LEFT HEART CATHETERIZATION WITH CORONARY ANGIOGRAM;  Surgeon: Josue Hector, MD;  Location[facility] CATH LAB;  Service: Cardiovascular;  Laterality: N/A;  . MULTIPLE TOOTH EXTRACTIONS  2014  . RETINAL TEAR REPAIR CRYOTHERAPY     Dr Wayne Sever    Social History:   reports that he quit smoking about 27 years ago. His smoking use included cigarettes. He has a 50.00 pack-year smoking history. He has never used smokeless tobacco. He reports current alcohol use. He reports that he does not use drugs.  Family History  Problem Relation Age of Onset  . Cancer Father        pancreatic  . Cancer Mother        pancreatic    Medications: Patient's Medications  New Prescriptions   No medications on file  Previous Medications   ALPRAZOLAM (XANAX) 0.5 MG TABLET    Take one tablet by mouth once daily as needed   ASPIRIN EC 81 MG TABLET    Take 81 mg by mouth at bedtime.   B COMPLEX VITAMINS TABLET    Take 1 tablet by mouth daily.   CARTIA XT 240 MG 24 HR CAPSULE    Take 1 capsule by mouth once daily   CETIRIZINE (ZYRTEC) 10 MG TABLET    Take 1 tablet (10 mg total) by mouth daily.   CHOLECALCIFEROL (VITAMIN D) 1000 UNITS TABLET    Take 1,000 Units by mouth daily.   GALANTAMINE (RAZADYNE ER) 24 MG 24 HR CAPSULE    Take one capsule by mouth once daily with breakfast   ISOSORBIDE MONONITRATE (IMDUR) 30 MG 24 HR TABLET    Take 1 tablet (30 mg total) by mouth daily. MUST KEEP 11/12/19 APPT WITH DR Johnsie Cancel TO CONT REFILLS. Thank you.   LEVOTHYROXINE (SYNTHROID) 88 MCG TABLET    TAKE 1 TABLET BY MOUTH ONCE DAILY ON AN EMPTY STOMACH 30 MINUTES BEFORE BREAKFAST   LOVASTATIN (MEVACOR) 40 MG TABLET    Take 1 tablet (40 mg total) by mouth at bedtime.   MEMANTINE (NAMENDA) 10 MG TABLET    Take 1 tablet (10 mg total) by mouth 2 (two) times daily.     MULTIPLE VITAMIN (MULTIVITAMIN WITH MINERALS) TABS TABLET    Take 1 tablet by mouth daily.   NITROGLYCERIN (NITROSTAT) 0.4 MG SL TABLET    One under the tongue if needed for chest tightness. Repeat in 5 minutes if needed. Repeat again in 5 minutes if needed.   SERTRALINE (ZOLOFT) 50 MG TABLET    Take 1 tablet (50 mg total) by mouth daily.   TAMSULOSIN (FLOMAX) 0.4 MG CAPS CAPSULE    TAKE ONE CAPSULE BY MOUTH ONCE DAILY TO HELP RELIEVE OBSTRUCTION   TRAZODONE (DESYREL) 100 MG TABLET    TAKE 1 TABLET BY MOUTH AT BEDTIME   VITAMIN C (ASCORBIC ACID) 500 MG TABLET    Take 500 mg by mouth daily.   ZINC GLUCONATE 50 MG TABLET  Take 50 mg by mouth at bedtime.   ZOLPIDEM (AMBIEN) 5 MG TABLET    Take 1 tablet (5 mg total) by mouth at bedtime as needed. for sleep  Modified Medications   No medications on file  Discontinued Medications   FLUTICASONE (FLONASE) 50 MCG/ACT NASAL SPRAY    Place 1 spray into both nostrils 2 (two) times daily as needed for allergies or rhinitis.   MIRABEGRON ER (MYRBETRIQ) 50 MG TB24 TABLET    Take 1 tablet (50 mg total) by mouth every other day.    Physical Exam:  Vitals:   12/10/19 1429  BP: 116/64  Pulse: 96  Temp: (!) 96.4 F (35.8 C)  TempSrc: Temporal  SpO2: 95%  Weight: 181 lb 12.8 oz (82.5 kg)  Height: 5' 10" (1.778 m)   Body mass index is 26.09 kg/m. Wt Readings from Last 3 Encounters:  12/10/19 181 lb 12.8 oz (82.5 kg)  11/12/19 184 lb (83.5 kg)  06/18/19 185 lb (83.9 kg)    Physical Exam Constitutional:      General: He is not in acute distress.    Appearance: He is well-developed. He is not diaphoretic.  HENT:     Head: Normocephalic and atraumatic.     Mouth/Throat:     Pharynx: No oropharyngeal exudate.  Eyes:     Conjunctiva/sclera: Conjunctivae normal.     Pupils: Pupils are equal, round, and reactive to light.  Cardiovascular:     Rate and Rhythm: Normal rate and regular rhythm.     Heart sounds: Normal heart sounds.  Pulmonary:      Effort: Pulmonary effort is normal.     Breath sounds: Normal breath sounds.  Abdominal:     General: Bowel sounds are normal.     Palpations: Abdomen is soft.  Musculoskeletal:        General: No tenderness.     Cervical back: Normal range of motion and neck supple.     Right lower leg: No edema.     Left lower leg: No edema.  Skin:    General: Skin is warm and dry.  Neurological:     Mental Status: He is alert and oriented to person, place, and time.    Labs reviewed: Basic Metabolic Panel: Recent Labs    12/11/18 0822 06/18/19 1124  NA 141 138  K 4.1 4.1  CL 107 101  CO2 25 24  GLUCOSE 92 93  BUN 11 9  CREATININE 1.30* 1.11  CALCIUM 9.9 9.4  TSH  --  2.66   Liver Function Tests: Recent Labs    12/11/18 0822 06/18/19 1124  AST 32 46*  ALT 35 46  BILITOT 0.5 0.7  PROT 6.0* 6.2   No results for input(s): LIPASE, AMYLASE in the last 8760 hours. No results for input(s): AMMONIA in the last 8760 hours. CBC: Recent Labs    12/11/18 0822 06/18/19 1124  WBC 5.3 6.1  NEUTROABS 3,408 4,008  HGB 15.2 16.2  HCT 44.1 46.9  MCV 96.7 96.9  PLT 196 225   Lipid Panel: Recent Labs    12/11/18 0822  CHOL 153  HDL 62  LDLCALC 69  TRIG 136  CHOLHDL 2.5   TSH: Recent Labs    06/18/19 1124  TSH 2.66   A1C: No results found for: HGBA1C   Assessment/Plan 1. Primary insomnia Improved, will continue with dose reduction of xanax as he has been able to titrate and hopefully stop; continues on Ambien as well as trazodone  for sleep and mood. - ALPRAZolam (XANAX) 0.25 MG tablet; Take 0.5 tablets (0.125 mg total) by mouth at bedtime as needed for anxiety. Take one tablet by mouth once daily as needed  Dispense: 30 tablet; Refill: 0  2. Mixed hyperlipidemia Continues on statin, dietary modifications encouraged.  - CMP with eGFR([facility]) - Lipid panel  3. PVD (peripheral vascular disease) (HCC) Stable, reports mild symptoms at this time. Continues on ASA 81 mg  daily  4. Essential hypertension, benign Well controlled on cartia, imdur - CMP with eGFR([facility]) - CBC with Differential/Platelet  5. Anxiety state Well controlled. Working on titration of xanax at this time.   6. Hypothyroidism, unspecified type Continues on synthroid, will follow up lab - TSH  7. BPH with elevated PSA -no changes in urinary flow or frequency, does not follow with urology at this time. - PSA  8. Need for 23-polyvalent pneumococcal polysaccharide vaccine - Pneumococcal polysaccharide vaccine 23-valent greater than or equal to 2yo subcutaneous/IM  Next appt: 6 months, sooner if needed   K. Lynch, [facility] [phone]

## 2019-12-10 NOTE — Patient Instructions (Signed)
Recommend to continue cutting back on xanax.

## 2019-12-11 LAB — COMPLETE METABOLIC PANEL WITH GFR
AG Ratio: 2.2 (calc) (ref 1.0–2.5)
ALT: 109 U/L — ABNORMAL HIGH (ref 9–46)
AST: 106 U/L — ABNORMAL HIGH (ref 10–35)
Albumin: 4.1 g/dL (ref 3.6–5.1)
Alkaline phosphatase (APISO): 112 U/L (ref 35–144)
BUN/Creatinine Ratio: 11 (calc) (ref 6–22)
BUN: 12 mg/dL (ref 7–25)
CO2: 27 mmol/L (ref 20–32)
Calcium: 9.3 mg/dL (ref 8.6–10.3)
Chloride: 101 mmol/L (ref 98–110)
Creat: 1.13 mg/dL — ABNORMAL HIGH (ref 0.70–1.11)
GFR, Est African American: 69 mL/min/{1.73_m2} (ref >=60)
GFR, Est Non African American: 60 mL/min/{1.73_m2} (ref >=60)
Globulin: 1.9 g/dL (calc) (ref 1.9–3.7)
Glucose, Bld: 198 mg/dL — ABNORMAL HIGH (ref 65–139)
Potassium: 3.9 mmol/L (ref 3.5–5.3)
Sodium: 137 mmol/L (ref 135–146)
Total Bilirubin: 0.9 mg/dL (ref 0.2–1.2)
Total Protein: 6 g/dL — ABNORMAL LOW (ref 6.1–8.1)

## 2019-12-11 LAB — LIPID PANEL
Cholesterol: 217 mg/dL — ABNORMAL HIGH (ref <200)
HDL: 78 mg/dL (ref >=40)
LDL Cholesterol (Calc): 119 mg/dL (calc) — ABNORMAL HIGH
Non-HDL Cholesterol (Calc): 139 mg/dL (calc) — ABNORMAL HIGH (ref <130)
Total CHOL/HDL Ratio: 2.8 (calc) (ref <5.0)
Triglycerides: 92 mg/dL (ref <150)

## 2019-12-11 LAB — CBC WITH DIFFERENTIAL/PLATELET
Absolute Monocytes: 371 cells/uL (ref 200–950)
Basophils Absolute: 19 cells/uL (ref 0–200)
Basophils Relative: 0.3 %
Eosinophils Absolute: 141 cells/uL (ref 15–500)
Eosinophils Relative: 2.2 %
HCT: 47.4 % (ref 38.5–50.0)
Hemoglobin: 16.2 g/dL (ref 13.2–17.1)
Lymphs Abs: 947 cells/uL (ref 850–3900)
MCH: 33.3 pg — ABNORMAL HIGH (ref 27.0–33.0)
MCHC: 34.2 g/dL (ref 32.0–36.0)
MCV: 97.5 fL (ref 80.0–100.0)
MPV: 10.7 fL (ref 7.5–12.5)
Monocytes Relative: 5.8 %
Neutro Abs: 4922 cells/uL (ref 1500–7800)
Neutrophils Relative %: 76.9 %
Platelets: 181 10*3/uL (ref 140–400)
RBC: 4.86 10*6/uL (ref 4.20–5.80)
RDW: 12.4 % (ref 11.0–15.0)
Total Lymphocyte: 14.8 %
WBC: 6.4 10*3/uL (ref 3.8–10.8)

## 2019-12-11 LAB — PSA: PSA: 0.7 ng/mL (ref <=4.0)

## 2019-12-11 LAB — TSH: TSH: 3.01 mIU/L (ref 0.40–4.50)

## 2019-12-13 ENCOUNTER — Other Ambulatory Visit: Payer: Self-pay

## 2019-12-13 ENCOUNTER — Inpatient Hospital Stay (HOSPITAL_COMMUNITY): Admission: RE | Admit: 2019-12-13 | Payer: Medicare Other | Source: Ambulatory Visit

## 2019-12-13 DIAGNOSIS — R7989 Other specified abnormal findings of blood chemistry: Secondary | ICD-10-CM

## 2019-12-17 ENCOUNTER — Ambulatory Visit: Payer: Medicare Other | Admitting: Nurse Practitioner

## 2019-12-20 ENCOUNTER — Ambulatory Visit: Payer: Medicare Other | Admitting: Nurse Practitioner

## 2019-12-20 ENCOUNTER — Other Ambulatory Visit: Payer: Self-pay

## 2019-12-20 ENCOUNTER — Ambulatory Visit (HOSPITAL_COMMUNITY)
Admission: RE | Admit: 2019-12-20 | Discharge: 2019-12-20 | Disposition: A | Discharge status: Home / Self Care | Payer: Medicare Other | Source: Ambulatory Visit | Attending: Cardiology | Admitting: Cardiology

## 2019-12-20 DIAGNOSIS — I739 Peripheral vascular disease, unspecified: Secondary | ICD-10-CM | POA: Diagnosis not present

## 2019-12-20 DIAGNOSIS — I2510 Atherosclerotic heart disease of native coronary artery without angina pectoris: Secondary | ICD-10-CM | POA: Insufficient documentation

## 2019-12-22 ENCOUNTER — Ambulatory Visit: Payer: Medicare Other | Admitting: Physician Assistant

## 2019-12-22 ENCOUNTER — Other Ambulatory Visit: Payer: Self-pay

## 2019-12-22 VITALS — BP 129/73 | HR 65 | Temp 98.1°F | Resp 20 | Ht 70.0 in | Wt 182.4 lb

## 2019-12-22 DIAGNOSIS — I739 Peripheral vascular disease, unspecified: Secondary | ICD-10-CM

## 2019-12-22 NOTE — Progress Notes (Signed)
Office Note     CC:  follow up PAD Requesting Provider:  Lauree Chandler, NP  HPI: Phillip Larson is a 85 y.o. (11-28-1934) male who presents for follow-up of calf pain that is brought on by exercise and quickly resolves with rest.  He has not done as much walking lately due to outdoor heat.  He was able to walk approximately 30 minutes without stopping, but has not done this in several weeks.  He denies rest pain.  He was evaluated by Dr. Scot Dock one year ago for complaints of right lower extremity claudication. He was able to walk three blocks with onset of symptoms, had no rest pain or skin breakdown. He was noted to have history and physical exam consistent with right SFA occlusive disease.   His risk factors for peripheral vascular disease include hypertension and hypercholesterolemia.  He denies any history of diabetes, family history of premature cardiovascular disease or tobacco use. His wife states his statin was discontinued  due to recent lab work indicating elevated LFTs. This was recent as [facility] note indicates he was on lovastatin on 12/10/2019.  S/p left knee replacement. Past Medical History:  Diagnosis Date  . Actinic keratosis   . Alzheimer's disease ([place])   . Anal fissure   . Anxiety state, unspecified   . Benign neoplasm of colon   . BENIGN PROSTATIC HYPERTROPHY, HX OF   . BPH (benign prostatic hypertrophy) with urinary obstruction 06/22/2008   Qualifier: Diagnosis of  By: Johnsie Cancel, MD, Rona Ravens   . Cerebrovascular disease, unspecified   . Coronary atherosclerosis of native coronary artery   . Depressive disorder, not elsewhere classified   . Diverticulosis of colon (without mention of hemorrhage)   . Essential hypertension, benign   . External hemorrhoids without mention of complication   . Insomnia, unspecified   . Mixed hyperlipidemia   . Osteoarthrosis, unspecified whether generalized or localized, unspecified site   . Other premature beats   .  Other psoriasis   . Pain in joint, lower leg   . Pain in joint, site unspecified   . Palpitations   . Pressure ulcer   . Reflux esophagitis   . Unspecified essential hypertension   . Unspecified hereditary and idiopathic peripheral neuropathy   . Unspecified hypothyroidism   . Unspecified vitamin D deficiency     Past Surgical History:  Procedure Laterality Date  . CERVICAL DISC SURGERY    . EYE SURGERY Left 02/07/2014   Cataract Surgery Left Eye  . KNEE SURGERY  07/18/2006   Dr French Ana   . LAMINECTOMY C3 disc  1972  . LEFT HEART CATHETERIZATION WITH CORONARY ANGIOGRAM N/A 01/28/2013   Procedure: LEFT HEART CATHETERIZATION WITH CORONARY ANGIOGRAM;  Surgeon: Josue Hector, MD;  Location: [facility] CATH LAB;  Service: Cardiovascular;  Laterality: N/A;  . MULTIPLE TOOTH EXTRACTIONS  2014  . RETINAL TEAR REPAIR CRYOTHERAPY     Dr Wayne Sever     Social History   Socioeconomic History  . Marital status: Married    Spouse name: Not on file  . Number of children: Not on file  . Years of education: Not on file  . Highest education level: Not on file  Occupational History  . Occupation: retired  Tobacco Use  . Smoking status: Former Smoker    Packs/day: 2.00    Years: 25.00    Pack years: 50.00    Types: Cigarettes    Quit date: 04/22/1992    Years since quitting:  27.6  . Smokeless tobacco: Never Used  Vaping Use  . Vaping Use: Never used  Substance and Sexual Activity  . Alcohol use: Yes    Alcohol/week: 0.0 standard drinks    Comment: wine/liquor daily  . Drug use: No  . Sexual activity: Not Currently  Other Topics Concern  . Not on file  Social History Narrative  . Not on file   Social Determinants of Health   Financial Resource Strain:   . Difficulty of Paying Living Expenses: Not on file  Food Insecurity:   . Worried About Charity fundraiser in the Last Year: Not on file  . Ran Out of Food in the Last Year: Not on file  Transportation Needs:   . Lack of Transportation  (Medical): Not on file  . Lack of Transportation (Non-Medical): Not on file  Physical Activity:   . Days of Exercise per Week: Not on file  . Minutes of Exercise per Session: Not on file  Stress:   . Feeling of Stress : Not on file  Social Connections:   . Frequency of Communication with Friends and Family: Not on file  . Frequency of Social Gatherings with Friends and Family: Not on file  . Attends Religious Services: Not on file  . Active Member of Clubs or Organizations: Not on file  . Attends Archivist Meetings: Not on file  . Marital Status: Not on file  Intimate Partner Violence:   . Fear of Current or Ex-Partner: Not on file  . Emotionally Abused: Not on file  . Physically Abused: Not on file  . Sexually Abused: Not on file   Family History  Problem Relation Age of Onset  . Cancer Father        pancreatic  . Cancer Mother        pancreatic    Current Outpatient Medications  Medication Sig Dispense Refill  . ALPRAZolam (XANAX) 0.25 MG tablet Take 0.5 tablets (0.125 mg total) by mouth at bedtime as needed for anxiety. Take one tablet by mouth once daily as needed 30 tablet 0  . aspirin EC 81 MG tablet Take 81 mg by mouth at bedtime.    [name] b complex vitamins tablet Take 1 tablet by mouth daily.    [name] CARTIA XT 240 MG 24 hr capsule Take 1 capsule by mouth once daily 90 capsule 3  . cetirizine (ZYRTEC) 10 MG tablet Take 1 tablet (10 mg total) by mouth daily. 30 tablet 11  . cholecalciferol (VITAMIN D) 1000 UNITS tablet Take 1,000 Units by mouth daily.    [name] galantamine (RAZADYNE ER) 24 MG 24 hr capsule Take one capsule by mouth once daily with breakfast 90 capsule 1  . isosorbide mononitrate (IMDUR) 30 MG 24 hr tablet Take 1 tablet (30 mg total) by mouth daily. MUST KEEP 11/12/19 APPT WITH DR Johnsie Cancel TO CONT REFILLS. Thank you. 30 tablet 0  . levothyroxine (SYNTHROID) 88 MCG tablet TAKE 1 TABLET BY MOUTH ONCE DAILY ON AN EMPTY STOMACH 30 MINUTES BEFORE BREAKFAST 90  tablet 1  . memantine (NAMENDA) 10 MG tablet Take 1 tablet (10 mg total) by mouth 2 (two) times daily. 60 tablet 1  . Multiple Vitamin (MULTIVITAMIN WITH MINERALS) TABS tablet Take 1 tablet by mouth daily.    . nitroGLYCERIN (NITROSTAT) 0.4 MG SL tablet One under the tongue if needed for chest tightness. Repeat in 5 minutes if needed. Repeat again in 5 minutes if needed. 25 tablet 12  .  sertraline (ZOLOFT) 50 MG tablet Take 1 tablet (50 mg total) by mouth daily. 90 tablet 1  . tamsulosin (FLOMAX) 0.4 MG CAPS capsule TAKE ONE CAPSULE BY MOUTH ONCE DAILY TO HELP RELIEVE OBSTRUCTION 90 capsule 1  . traZODone (DESYREL) 100 MG tablet TAKE 1 TABLET BY MOUTH AT BEDTIME 90 tablet 0  . vitamin C (ASCORBIC ACID) 500 MG tablet Take 500 mg by mouth daily.    [name] zinc gluconate 50 MG tablet Take 50 mg by mouth at bedtime.    [name] zolpidem (AMBIEN) 5 MG tablet Take 1 tablet (5 mg total) by mouth at bedtime as needed. for sleep 30 tablet 0   No current facility-administered medications for this visit.    Allergies  Allergen Reactions  . Iodine Swelling    Internal iodine  . Iohexol Hives          REVIEW OF SYSTEMS:   [X]  denotes positive finding, [ ]  denotes negative finding Cardiac  Comments:  Chest pain or chest pressure:    Shortness of breath upon exertion:    Short of breath when lying flat:    Irregular heart rhythm:        Vascular    Pain in calf, thigh, or hip brought on by ambulation: x   Pain in feet at night that wakes you up from your sleep:     Blood clot in your veins:    Leg swelling:         Pulmonary    Oxygen at home:    Productive cough:     Wheezing:         Neurologic    Sudden weakness in arms or legs:     Sudden numbness in arms or legs:     Sudden onset of difficulty speaking or slurred speech:    Temporary loss of vision in one eye:     Problems with dizziness:         Gastrointestinal    Blood in stool:     Vomited blood:         Genitourinary    Burning  when urinating:     Blood in urine:        Psychiatric    Major depression:         Hematologic    Bleeding problems:    Problems with blood clotting too easily:        Skin    Rashes or ulcers:        Constitutional    Fever or chills:      PHYSICAL EXAMINATION:  Vitals:   12/22/19 1245  BP: 129/73  Pulse: 65  Resp: 20  Temp: 98.1 F (36.7 C)  SpO2: 95%   General:  WDWN in NAD; vital signs documented above Gait: no ataxia; walks unaided HENT: WNL, normocephalic Pulmonary: normal non-labored breathing , without Rales, rhonchi,  wheezing Cardiac: regular HR, occasional dropped beat Abdomen: soft, NT, no masses Skin: without rashes Vascular Exam/Pulses: 2+ bilateral femoral pulses.  1+ left popliteal pulse.  Unable to palpate pedal pulses.  On the left, he has a biphasic posterior tibial Doppler signal and monophasic dorsalis pedis and peroneal signals.  On the right, he has monophasic DP and PT signals.  Weak peroneal signal. Extremities: without ischemic changes, without Gangrene , without cellulitis; without open wounds; bilateral delayed capillary refill noted Musculoskeletal: no muscle wasting or atrophy  Neurologic: A&O X 3;  No focal weakness or paresthesias are detected Psychiatric:  The pt has Normal affect.   Non-Invasive Vascular Imaging:   12/22/2019 Duplex examination of bilateral lower extremities reveals 50 to 74% stenosis of the midportion of both superficial femoral arteries.  There are biphasic signals throughout the left lower extremity.  There are biphasic signals on the right through the mid SFA and monophasic distally.  ABIs: Right: 0.61, toe pressure 88, TBI: 0.47 Left: 0.92, toe pressure 153, TBI: 0.81   ASSESSMENT/PLAN:: 85 y.o. male here for follow up for PAD with claudication.  Imaging evidence of bilateral superficial femoral artery stenosis.  This is not altering his ADLs.  His ABIs and toe pressures are essentially unchanged as compared to  1 year ago indicating moderate PAD of the RLE and mild disease of the LLE.  Discussed the importance of maintaining a regular walking program and management of his hypertension.  His statin was discontinued due to elevated LFTs.  On aspirin 81 mg daily. Monitor symptoms and recheck in 6 months with non-invasive studies.  Encouraged him to call sooner for re-eval should he develop worsening sypmtoms or non-healing skin wound.  Barbie Banner, PA-C [facility] [phone]  Clinic MD:   Scot Dock

## 2019-12-23 ENCOUNTER — Telehealth: Payer: Self-pay

## 2019-12-23 ENCOUNTER — Other Ambulatory Visit: Payer: Self-pay | Admitting: Nurse Practitioner

## 2019-12-23 MED ORDER — LEVOTHYROXINE SODIUM 88 MCG PO TABS: ORAL_TABLET | ORAL | 1 refills | Status: DC

## 2019-12-23 NOTE — Telephone Encounter (Signed)
Incoming fax received from [place] with note to prescriber: We have a different manufacture is it ok to change levothyroxine 88 mcg?  Please advise

## 2019-12-23 NOTE — Telephone Encounter (Signed)
I left detailed message on pharmacy voicemail with Dinah's response

## 2019-12-23 NOTE — Telephone Encounter (Signed)
Please do not change levothyroxine prescribe as ordered.

## 2019-12-24 ENCOUNTER — Other Ambulatory Visit: Payer: Medicare Other

## 2019-12-24 ENCOUNTER — Other Ambulatory Visit: Payer: Self-pay | Admitting: *Deleted

## 2019-12-24 ENCOUNTER — Other Ambulatory Visit: Payer: Self-pay

## 2019-12-24 DIAGNOSIS — R7989 Other specified abnormal findings of blood chemistry: Secondary | ICD-10-CM

## 2019-12-24 DIAGNOSIS — I739 Peripheral vascular disease, unspecified: Secondary | ICD-10-CM

## 2019-12-24 DIAGNOSIS — R945 Abnormal results of liver function studies: Secondary | ICD-10-CM | POA: Diagnosis not present

## 2019-12-25 LAB — HEPATIC FUNCTION PANEL
AG Ratio: 1.8 (calc) (ref 1.0–2.5)
ALT: 60 U/L — ABNORMAL HIGH (ref 9–46)
AST: 47 U/L — ABNORMAL HIGH (ref 10–35)
Albumin: 4.2 g/dL (ref 3.6–5.1)
Alkaline phosphatase (APISO): 141 U/L (ref 35–144)
Bilirubin, Direct: 0.1 mg/dL (ref 0.0–0.2)
Globulin: 2.4 g/dL (calc) (ref 1.9–3.7)
Indirect Bilirubin: 0.4 mg/dL (calc) (ref 0.2–1.2)
Total Bilirubin: 0.5 mg/dL (ref 0.2–1.2)
Total Protein: 6.6 g/dL (ref 6.1–8.1)

## 2019-12-29 ENCOUNTER — Other Ambulatory Visit: Payer: Self-pay

## 2020-01-04 ENCOUNTER — Other Ambulatory Visit: Payer: Self-pay | Admitting: Family

## 2020-01-04 DIAGNOSIS — F5104 Psychophysiologic insomnia: Secondary | ICD-10-CM

## 2020-01-06 ENCOUNTER — Other Ambulatory Visit: Payer: Self-pay | Admitting: Nurse Practitioner

## 2020-01-06 DIAGNOSIS — F5101 Primary insomnia: Secondary | ICD-10-CM

## 2020-01-07 ENCOUNTER — Other Ambulatory Visit: Payer: Self-pay | Admitting: *Deleted

## 2020-01-07 DIAGNOSIS — F5101 Primary insomnia: Secondary | ICD-10-CM

## 2020-01-07 MED ORDER — ZOLPIDEM TARTRATE 5 MG PO TABS: 5.0000 mg | ORAL_TABLET | Freq: Every evening | ORAL | 0 refills | Status: DC | PRN

## 2020-01-07 MED ORDER — ALPRAZOLAM 0.25 MG PO TABS: 0.1250 mg | ORAL_TABLET | Freq: Every evening | ORAL | 0 refills | Status: DC | PRN

## 2020-01-07 NOTE — Telephone Encounter (Signed)
Patient wife called requesting refills.  Alprazolam LR: 11/24/2019 (spoke with pharmacist at [facility])  Zolpidem LR: 12/09/18 (Spoke with pharmacist at [facility])  [place] Rx's and sent to [facility] for approval.      OV NOTE: 12/10/2019: Assessment/Plan 1. Primary insomnia Improved, will continue with dose reduction of xanax as he has been able to titrate and hopefully stop; continues on Ambien as well as trazodone for sleep and mood. - ALPRAZolam (XANAX) 0.25 MG tablet; Take 0.5 tablets (0.125 mg total) by mouth at bedtime as needed for anxiety. Take one tablet by mouth once daily as needed  Dispense: 30 tablet; Refill: 0

## 2020-01-12 DIAGNOSIS — R3915 Urgency of urination: Secondary | ICD-10-CM | POA: Diagnosis not present

## 2020-01-21 ENCOUNTER — Other Ambulatory Visit: Payer: Medicare Other

## 2020-01-21 ENCOUNTER — Other Ambulatory Visit: Payer: Self-pay

## 2020-01-21 ENCOUNTER — Encounter: Payer: Self-pay | Admitting: Nurse Practitioner

## 2020-01-21 ENCOUNTER — Ambulatory Visit (INDEPENDENT_AMBULATORY_CARE_PROVIDER_SITE_OTHER): Payer: Medicare Other | Admitting: Nurse Practitioner

## 2020-01-21 VITALS — BP 122/78 | HR 63 | Temp 96.2°F | Ht 70.0 in | Wt 179.0 lb

## 2020-01-21 DIAGNOSIS — R634 Abnormal weight loss: Secondary | ICD-10-CM | POA: Diagnosis not present

## 2020-01-21 DIAGNOSIS — Z23 Encounter for immunization: Secondary | ICD-10-CM

## 2020-01-21 DIAGNOSIS — R945 Abnormal results of liver function studies: Secondary | ICD-10-CM | POA: Diagnosis not present

## 2020-01-21 DIAGNOSIS — G309 Alzheimer's disease, unspecified: Secondary | ICD-10-CM

## 2020-01-21 DIAGNOSIS — F5101 Primary insomnia: Secondary | ICD-10-CM | POA: Diagnosis not present

## 2020-01-21 DIAGNOSIS — M545 Low back pain, unspecified: Secondary | ICD-10-CM

## 2020-01-21 DIAGNOSIS — F0280 Dementia in other diseases classified elsewhere without behavioral disturbance: Secondary | ICD-10-CM

## 2020-01-21 MED ORDER — ALPRAZOLAM 0.25 MG PO TABS: 0.2500 mg | ORAL_TABLET | Freq: Every day | ORAL | 0 refills | Status: DC

## 2020-01-21 NOTE — Patient Instructions (Addendum)
STOP ALL alcohol intake   Will give one more month supply of the xanax 0.25 mg to take whole take at bedtime, after this will decrease to half tablet at bed. To encourage you to take only if needed for sleep.  To use heating pad to low back 3 times daily ~20-30 mins To apply muscle rub after heat If needed we can refer to PT to help with pain- please let us know  You need to make sure you are eating 3 meals a day with good protein To get your daily serving of fruit and vegetables for proper nutrients also continue multivitamin    Low Back Sprain or Strain Rehab Ask your health care provider which exercises are safe for you. Do exercises exactly as told by your health care provider and adjust them as directed. It is normal to feel mild stretching, pulling, tightness, or discomfort as you do these exercises. Stop right away if you feel sudden pain or your pain gets worse. Do not begin these exercises until told by your health care provider. Stretching and range-of-motion exercises These exercises warm up your muscles and joints and improve the movement and flexibility of your back. These exercises also help to relieve pain, numbness, and tingling. Lumbar rotation  1. Lie on your back on a firm surface and bend your knees. 2. Straighten your arms out to your sides so each arm forms a 90-degree angle (right angle) with a side of your body. 3. Slowly move (rotate) both of your knees to one side of your body until you feel a stretch in your lower back (lumbar). Try not to let your shoulders lift off the floor. 4. Hold this position for __________ seconds. 5. Tense your abdominal muscles and slowly move your knees back to the starting position. 6. Repeat this exercise on the other side of your body. Repeat __________ times. Complete this exercise __________ times a day. Single knee to chest  1. Lie on your back on a firm surface with both legs straight. 2. Bend one of your knees. Use your hands  to move your knee up toward your chest until you feel a gentle stretch in your lower back and buttock. ? Hold your leg in this position by holding on to the front of your knee. ? Keep your other leg as straight as possible. 3. Hold this position for __________ seconds. 4. Slowly return to the starting position. 5. Repeat with your other leg. Repeat __________ times. Complete this exercise __________ times a day. Prone extension on elbows  1. Lie on your abdomen on a firm surface (prone position). 2. Prop yourself up on your elbows. 3. Use your arms to help lift your chest up until you feel a gentle stretch in your abdomen and your lower back. ? This will place some of your body weight on your elbows. If this is uncomfortable, try stacking pillows under your chest. ? Your hips should stay down, against the surface that you are lying on. Keep your hip and back muscles relaxed. 4. Hold this position for __________ seconds. 5. Slowly relax your upper body and return to the starting position. Repeat __________ times. Complete this exercise __________ times a day. Strengthening exercises These exercises build strength and endurance in your back. Endurance is the ability to use your muscles for a long time, even after they get tired. Pelvic tilt This exercise strengthens the muscles that lie deep in the abdomen. 1. Lie on your back on a firm  surface. Bend your knees and keep your feet flat on the floor. 2. Tense your abdominal muscles. Tip your pelvis up toward the ceiling and flatten your lower back into the floor. ? To help with this exercise, you may place a small towel under your lower back and try to push your back into the towel. 3. Hold this position for __________ seconds. 4. Let your muscles relax completely before you repeat this exercise. Repeat __________ times. Complete this exercise __________ times a day. Alternating arm and leg raises  1. Get on your hands and knees on a firm  surface. If you are on a hard floor, you may want to use padding, such as an exercise mat, to cushion your knees. 2. Line up your arms and legs. Your hands should be directly below your shoulders, and your knees should be directly below your hips. 3. Lift your left leg behind you. At the same time, raise your right arm and straighten it in front of you. ? Do not lift your leg higher than your hip. ? Do not lift your arm higher than your shoulder. ? Keep your abdominal and back muscles tight. ? Keep your hips facing the ground. ? Do not arch your back. ? Keep your balance carefully, and do not hold your breath. 4. Hold this position for __________ seconds. 5. Slowly return to the starting position. 6. Repeat with your right leg and your left arm. Repeat __________ times. Complete this exercise __________ times a day. Abdominal set with straight leg raise  1. Lie on your back on a firm surface. 2. Bend one of your knees and keep your other leg straight. 3. Tense your abdominal muscles and lift your straight leg up, 4-6 inches (10-15 cm) off the ground. 4. Keep your abdominal muscles tight and hold this position for __________ seconds. ? Do not hold your breath. ? Do not arch your back. Keep it flat against the ground. 5. Keep your abdominal muscles tense as you slowly lower your leg back to the starting position. 6. Repeat with your other leg. Repeat __________ times. Complete this exercise __________ times a day. Single leg lower with bent knees 1. Lie on your back on a firm surface. 2. Tense your abdominal muscles and lift your feet off the floor, one foot at a time, so your knees and hips are bent in 90-degree angles (right angles). ? Your knees should be over your hips and your lower legs should be parallel to the floor. 3. Keeping your abdominal muscles tense and your knee bent, slowly lower one of your legs so your toe touches the ground. 4. Lift your leg back up to return to the  starting position. ? Do not hold your breath. ? Do not let your back arch. Keep your back flat against the ground. 5. Repeat with your other leg. Repeat __________ times. Complete this exercise __________ times a day. Posture and body mechanics Good posture and healthy body mechanics can help to relieve stress in your body's tissues and joints. Body mechanics refers to the movements and positions of your body while you do your daily activities. Posture is part of body mechanics. Good posture means:  Your spine is in its natural S-curve position (neutral).  Your shoulders are pulled back slightly.  Your head is not tipped forward. Follow these guidelines to improve your posture and body mechanics in your everyday activities. Standing   When standing, keep your spine neutral and your feet about hip width  apart. Keep a slight bend in your knees. Your ears, shoulders, and hips should line up.  When you do a task in which you stand in one place for a long time, place one foot up on a stable object that is 2-4 inches (5-10 cm) high, such as a footstool. This helps keep your spine neutral. Sitting   When sitting, keep your spine neutral and keep your feet flat on the floor. Use a footrest, if necessary, and keep your thighs parallel to the floor. Avoid rounding your shoulders, and avoid tilting your head forward.  When working at a desk or a computer, keep your desk at a height where your hands are slightly lower than your elbows. Slide your chair under your desk so you are close enough to maintain good posture.  When working at a computer, place your monitor at a height where you are looking straight ahead and you do not have to tilt your head forward or downward to look at the screen. Resting  When lying down and resting, avoid positions that are most painful for you.  If you have pain with activities such as sitting, bending, stooping, or squatting, lie in a position in which your body  does not bend very much. For example, avoid curling up on your side with your arms and knees near your chest (fetal position).  If you have pain with activities such as standing for a long time or reaching with your arms, lie with your spine in a neutral position and bend your knees slightly. Try the following positions: ? Lying on your side with a pillow between your knees. ? Lying on your back with a pillow under your knees. Lifting   When lifting objects, keep your feet at least shoulder width apart and tighten your abdominal muscles.  Bend your knees and hips and keep your spine neutral. It is important to lift using the strength of your legs, not your back. Do not lock your knees straight out.  Always ask for help to lift heavy or awkward objects. This information is not intended to replace advice given to you by your health care provider. Make sure you discuss any questions you have with your health care provider. Document Revised: 07/31/2018 Document Reviewed: 04/30/2018 Elsevier Patient Education  [employer].

## 2020-01-21 NOTE — Progress Notes (Signed)
Careteam: Patient Care Team: Lauree Chandler, NP as PCP - General (Geriatric Medicine) Josue Hector, MD as PCP - Cardiology (Cardiology) Clent Jacks, MD as Consulting Physician (Ophthalmology) Lamonte Sakai Rose Fillers, MD as Consulting Physician (Pulmonary Disease) Angelia Mould, MD as Consulting Physician (Vascular Surgery) Earlie Server, MD as Consulting Physician (Orthopedic Surgery) Wilford Corner, MD as Consulting Physician (Gastroenterology) Chesley Mires, MD as Consulting Physician (Pulmonary Disease)  PLACE OF SERVICE:  [facility]  Advanced Directive information    Allergies  Allergen Reactions  . Iodine Swelling    Internal iodine  . Iohexol Hives         Chief Complaint  Patient presents with  . Acute Visit    Right hip pain x 6 weeks and no known injury. Patient questions if pain is related to kidneys. Patient seen urologist a few weeks. Flu vaccine today. Refill Xanax      HPI: Patient is a 85 y.o. male due to the right hip pain (points to low back).   Reports he sleeps fine but wife reports he is getting up at 5-6 in the morning. He has gone to bed later and that helps.  He was taking 0.5 xanax half tablet and this was decreased to 0.25 half tablet but wife expresses this was done "prematuraly" Wife reports he has given up Gin and that he is having more trouble sleeping then what he expressed. Request his prescription to be increase to 0.25 mg whole tablet. Wife states that when pt was alone at last visit he did not give a reliable history.  Pt reported he slept well and did not use xanax nightly. He reports he was doing well on a half tablet of 0.5 mg (which would equal 0.25 mg) and therefore his dose was reduced.  Continues to drink wine.  Reports low back pain, has been going on for 3-4 weeks.  No fall or injury. Wife reports he is not active at all.  Pain off and on. Certain movement causes pain. Currently not having pain. Does not keep him  awake at night.  No pain at this time.  Area is sore.  Not "big pain" Has not taken anything for pain. Wife gives an advil when it is hurting bad.  Sore most the time.   Not thinking about eating. Does not like to eat vegetables or fruit. Will eat things like a buscuit. Has lost weight.  Review of Systems:  Review of Systems  Constitutional: Positive for weight loss. Negative for chills and fever.  HENT: Negative for tinnitus.   Respiratory: Negative for cough, sputum production and shortness of breath.   Cardiovascular: Negative for chest pain, palpitations and leg swelling.  Gastrointestinal: Negative for abdominal pain, constipation, diarrhea and heartburn.  Genitourinary: Negative for dysuria, frequency and urgency.  Musculoskeletal: Positive for back pain. Negative for falls, joint pain and myalgias.  Skin: Negative.   Neurological: Negative for dizziness and headaches.  Psychiatric/Behavioral: Positive for memory loss. Negative for depression. The patient has insomnia.     Past Medical History:  Diagnosis Date  . Actinic keratosis   . Alzheimer's disease ([place])   . Anal fissure   . Anxiety state, unspecified   . Benign neoplasm of colon   . BENIGN PROSTATIC HYPERTROPHY, HX OF   . BPH (benign prostatic hypertrophy) with urinary obstruction 06/22/2008   Qualifier: Diagnosis of  By: Johnsie Cancel, MD, Rona Ravens   . Cerebrovascular disease, unspecified   . Coronary atherosclerosis of  native coronary artery   . Depressive disorder, not elsewhere classified   . Diverticulosis of colon (without mention of hemorrhage)   . Essential hypertension, benign   . External hemorrhoids without mention of complication   . Insomnia, unspecified   . Mixed hyperlipidemia   . Osteoarthrosis, unspecified whether generalized or localized, unspecified site   . Other premature beats   . Other psoriasis   . Pain in joint, lower leg   . Pain in joint, site unspecified   . Palpitations   .  Pressure ulcer   . Reflux esophagitis   . Unspecified essential hypertension   . Unspecified hereditary and idiopathic peripheral neuropathy   . Unspecified hypothyroidism   . Unspecified vitamin D deficiency    Past Surgical History:  Procedure Laterality Date  . CERVICAL DISC SURGERY    . EYE SURGERY Left 02/07/2014   Cataract Surgery Left Eye  . KNEE SURGERY  07/18/2006   Dr French Ana   . LAMINECTOMY C3 disc  1972  . LEFT HEART CATHETERIZATION WITH CORONARY ANGIOGRAM N/A 01/28/2013   Procedure: LEFT HEART CATHETERIZATION WITH CORONARY ANGIOGRAM;  Surgeon: Josue Hector, MD;  Location: [facility] CATH LAB;  Service: Cardiovascular;  Laterality: N/A;  . MULTIPLE TOOTH EXTRACTIONS  2014  . RETINAL TEAR REPAIR CRYOTHERAPY     Dr Wayne Sever    Social History:   reports that he quit smoking about 27 years ago. His smoking use included cigarettes. He has a 50.00 pack-year smoking history. He has never used smokeless tobacco. He reports current alcohol use. He reports that he does not use drugs.  Family History  Problem Relation Age of Onset  . Cancer Father        pancreatic  . Cancer Mother        pancreatic    Medications: Patient's Medications  New Prescriptions   No medications on file  Previous Medications   ALPRAZOLAM (XANAX) 0.25 MG TABLET    Take 0.25 mg by mouth at bedtime.   ASPIRIN EC 81 MG TABLET    Take 81 mg by mouth at bedtime.   B COMPLEX VITAMINS TABLET    Take 1 tablet by mouth daily.   CARTIA XT 240 MG 24 HR CAPSULE    Take 1 capsule by mouth once daily   CETIRIZINE (ZYRTEC) 10 MG TABLET    Take 1 tablet (10 mg total) by mouth daily.   CHOLECALCIFEROL (VITAMIN D) 1000 UNITS TABLET    Take 1,000 Units by mouth daily.   GALANTAMINE (RAZADYNE ER) 24 MG 24 HR CAPSULE    Take one capsule by mouth once daily with breakfast   ISOSORBIDE MONONITRATE (IMDUR) 30 MG 24 HR TABLET    Take 1 tablet (30 mg total) by mouth daily. MUST KEEP 11/12/19 APPT WITH DR Johnsie Cancel TO CONT REFILLS.  Thank you.   LEVOTHYROXINE (SYNTHROID) 88 MCG TABLET    TAKE 1 TABLET BY MOUTH ONCE DAILY ON AN EMPTY STOMACH 30 MINUTES BEFORE BREAKFAST   MEMANTINE (NAMENDA) 10 MG TABLET    Take 1 tablet (10 mg total) by mouth 2 (two) times daily.   MIRABEGRON ER (MYRBETRIQ) 50 MG TB24 TABLET    Take 50 mg by mouth every other day.   MULTIPLE VITAMIN (MULTIVITAMIN WITH MINERALS) TABS TABLET    Take 1 tablet by mouth daily.   NITROGLYCERIN (NITROSTAT) 0.4 MG SL TABLET    One under the tongue if needed for chest tightness. Repeat in 5 minutes if needed. Repeat again  in 5 minutes if needed.   SERTRALINE (ZOLOFT) 50 MG TABLET    Take 1 tablet (50 mg total) by mouth daily.   TAMSULOSIN (FLOMAX) 0.4 MG CAPS CAPSULE    TAKE ONE CAPSULE BY MOUTH ONCE DAILY TO HELP RELIEVE OBSTRUCTION   TRAZODONE (DESYREL) 100 MG TABLET    TAKE 1 TABLET BY MOUTH AT BEDTIME   VITAMIN C (ASCORBIC ACID) 500 MG TABLET    Take 500 mg by mouth daily.   ZINC GLUCONATE 50 MG TABLET    Take 50 mg by mouth at bedtime.   ZOLPIDEM (AMBIEN) 5 MG TABLET    Take 5 mg by mouth at bedtime.  Modified Medications   No medications on file  Discontinued Medications   ALPRAZOLAM (XANAX) 0.25 MG TABLET    Take 0.5 tablets (0.125 mg total) by mouth at bedtime as needed for anxiety. Take one tablet by mouth once daily as needed   ZOLPIDEM (AMBIEN) 5 MG TABLET    Take 1 tablet (5 mg total) by mouth at bedtime as needed. for sleep    Physical Exam:  Vitals:   01/21/20 1103  BP: 122/78  Pulse: 63  Temp: (!) 96.2 F (35.7 C)  TempSrc: Temporal  SpO2: 97%  Weight: 179 lb (81.2 kg)  Height: 5\' 10"  (1.778 m)   Body mass index is 25.68 kg/m. Wt Readings from Last 3 Encounters:  01/21/20 179 lb (81.2 kg)  12/22/19 182 lb 6.4 oz (82.7 kg)  12/10/19 181 lb 12.8 oz (82.5 kg)    Physical Exam Constitutional:      General: He is not in acute distress.    Appearance: He is well-developed. He is not diaphoretic.  HENT:     Head: Normocephalic and  atraumatic.     Mouth/Throat:     Pharynx: No oropharyngeal exudate.  Eyes:     Conjunctiva/sclera: Conjunctivae normal.     Pupils: Pupils are equal, round, and reactive to light.  Cardiovascular:     Rate and Rhythm: Normal rate and regular rhythm.     Heart sounds: Normal heart sounds.  Pulmonary:     Effort: Pulmonary effort is normal.     Breath sounds: Normal breath sounds.  Abdominal:     General: Bowel sounds are normal.     Palpations: Abdomen is soft.  Musculoskeletal:        General: No tenderness. Normal range of motion.     Cervical back: Normal range of motion and neck supple.     Right lower leg: No edema.     Left lower leg: No edema.  Skin:    General: Skin is warm and dry.  Neurological:     Mental Status: He is alert and oriented to person, place, and time. Mental status is at baseline.  Psychiatric:        Mood and Affect: Mood normal.        Behavior: Behavior normal.     Labs reviewed: Basic Metabolic Panel: Recent Labs    06/18/19 1124 12/10/19 1523  NA 138 137  K 4.1 3.9  CL 101 101  CO2 24 27  GLUCOSE 93 198*  BUN 9 12  CREATININE 1.11 1.13*  CALCIUM 9.4 9.3  TSH 2.66 3.01   Liver Function Tests: Recent Labs    06/18/19 1124 12/10/19 1523 12/24/19 0902  AST 46* 106* 47*  ALT 46 109* 60*  BILITOT 0.7 0.9 0.5  PROT 6.2 6.0* 6.6   No results for input(s):  LIPASE, AMYLASE in the last 8760 hours. No results for input(s): AMMONIA in the last 8760 hours. CBC: Recent Labs    06/18/19 1124 12/10/19 1523  WBC 6.1 6.4  NEUTROABS 4,008 4,922  HGB 16.2 16.2  HCT 46.9 47.4  MCV 96.9 97.5  PLT 225 181   Lipid Panel: Recent Labs    12/10/19 1523  CHOL 217*  HDL 78  LDLCALC 119*  TRIG 92  CHOLHDL 2.8   TSH: Recent Labs    06/18/19 1124 12/10/19 1523  TSH 2.66 3.01   A1C: No results found for: HGBA1C   Assessment/Plan 1. Need for immunization against influenza - Flu Vaccine QUAD High Dose(Fluad)  2. Primary  insomnia -discussed in detail the adverse effects of alprazolam with [place] with pt and wife. Also discussed that the 2 together had increase risk suchs as falls worsening cognitive decline. Discussed that xanax was not used to treat sleep and not meant to be taken routinely. There is some  dependency due to fact he has taken the medication long term and educated that would would need to decrease dose over time but ultimately goal is to stop xanax. Welcomed the patient and wife to seek a second option for sleep management and discussed cognitive behavioral therapy but wife declined.  Pt and wife made aware that on next refill the prescription would be reduced to half tablet at bedtime and recommend to take only if needed -can go ahead and cut back on this months prescription if able.  - ALPRAZolam (XANAX) 0.25 MG tablet; Take 1 tablet (0.25 mg total) by mouth at bedtime.  Dispense: 30 tablet; Refill: 0 -pt also taking trazodone and ambein for sleep.  3. ALZHEIMERS DISEASE Stable, poor historian per wife. Continues on namenda and razadyne daily. Discussed importance of coming off xanax to prevent worsening of memory prematurely and stopping ETOH.  Weight loss noted which could be due to progression of dementia as he is not eating 3 meals a day.  4. Weight loss -encouraged 3 meals a day, proper nutrition encouraged and to limit empty calories.   5. Acute right-sided low back pain without sciatica Comes and goes To use heating pad to low back 3 times daily ~20-30 mins To apply muscle rub after heat If needed we can refer to PT to help with pain Can use tylenol PRN   Next appt: 05/27/19 Janett Billow K. [place], [facility] [phone]

## 2020-01-24 NOTE — Telephone Encounter (Signed)
Opened in error

## 2020-01-25 ENCOUNTER — Other Ambulatory Visit: Payer: Self-pay | Admitting: Cardiovascular Disease

## 2020-01-25 LAB — HEPATIC FUNCTION PANEL
AG Ratio: 1.8 (calc) (ref 1.0–2.5)
ALT: 29 U/L (ref 9–46)
AST: 33 U/L (ref 10–35)
Albumin: 4.1 g/dL (ref 3.6–5.1)
Alkaline phosphatase (APISO): 101 U/L (ref 35–144)
Bilirubin, Direct: 0.1 mg/dL (ref 0.0–0.2)
Globulin: 2.3 g/dL (calc) (ref 1.9–3.7)
Indirect Bilirubin: 0.4 mg/dL (calc) (ref 0.2–1.2)
Total Bilirubin: 0.5 mg/dL (ref 0.2–1.2)
Total Protein: 6.4 g/dL (ref 6.1–8.1)

## 2020-01-25 LAB — PSA: PSA: 0.62 ng/mL (ref <=4.0)

## 2020-01-25 LAB — TEST AUTHORIZATION

## 2020-01-28 ENCOUNTER — Other Ambulatory Visit: Payer: Self-pay

## 2020-02-08 ENCOUNTER — Other Ambulatory Visit: Payer: Self-pay | Admitting: *Deleted

## 2020-02-08 MED ORDER — ZOLPIDEM TARTRATE 5 MG PO TABS
5.0000 mg | ORAL_TABLET | Freq: Every day | ORAL | 0 refills | Status: DC
Start: 2020-02-08 — End: 2020-03-10

## 2020-02-08 NOTE — Telephone Encounter (Signed)
Patient wife called requesting refill Epic LR: 01/07/2020 Contract on [employer] [facility] and sent to [place] for approval.

## 2020-02-22 ENCOUNTER — Other Ambulatory Visit: Payer: Self-pay | Admitting: Nurse Practitioner

## 2020-02-22 DIAGNOSIS — F5101 Primary insomnia: Secondary | ICD-10-CM

## 2020-02-22 DIAGNOSIS — F0280 Dementia in other diseases classified elsewhere without behavioral disturbance: Secondary | ICD-10-CM

## 2020-02-22 MED ORDER — ALPRAZOLAM 0.25 MG PO TABS: 0.2500 mg | ORAL_TABLET | Freq: Every day | ORAL | 0 refills | Status: DC

## 2020-02-22 NOTE — Telephone Encounter (Signed)
Phillip Larson, wife requested refill Epic LR: 01/21/2020 Contract on [employer] [facility] and sent to [employer] for approval.  Janett Billow out of office)

## 2020-03-04 ENCOUNTER — Other Ambulatory Visit: Payer: Self-pay

## 2020-03-04 ENCOUNTER — Ambulatory Visit: Payer: Medicare Other | Attending: Internal Medicine

## 2020-03-04 DIAGNOSIS — Z23 Encounter for immunization: Secondary | ICD-10-CM

## 2020-03-04 NOTE — Progress Notes (Signed)
   Covid-19 Vaccination Clinic  Name:  Phillip Larson    MRN: [id number] DOB: 1934/09/12  03/04/2020  Phillip Larson was observed post Covid-19 immunization for 15 minutes without incident. He was provided with Vaccine Information Sheet and instruction to access the V-Safe system.   Phillip Larson was instructed to call 911 with any severe reactions post vaccine: [name] Difficulty breathing  . Swelling of face and throat  . A fast heartbeat  . A bad rash all over body  . Dizziness and weakness   Immunizations Administered    Name Date Dose VIS Date Route   Pfizer COVID-19 Vaccine 03/04/2020  3:57 PM 0.3 mL 02/09/2020 Intramuscular   Manufacturer: [employer]   Lot: [id number]   [place]: [id number]

## 2020-03-10 ENCOUNTER — Other Ambulatory Visit: Payer: Self-pay | Admitting: *Deleted

## 2020-03-10 MED ORDER — ZOLPIDEM TARTRATE 5 MG PO TABS: 5.0000 mg | ORAL_TABLET | Freq: Every day | ORAL | 0 refills | Status: DC

## 2020-03-10 NOTE — Telephone Encounter (Signed)
Patient wife requested refill Epic LR: 02/08/2020 Contract on [employer] [facility] and sent to [place] for approval.

## 2020-04-04 ENCOUNTER — Other Ambulatory Visit: Payer: Self-pay | Admitting: Nurse Practitioner

## 2020-04-04 DIAGNOSIS — F0280 Dementia in other diseases classified elsewhere without behavioral disturbance: Secondary | ICD-10-CM

## 2020-04-04 MED ORDER — MEMANTINE HCL 10 MG PO TABS: 10.0000 mg | ORAL_TABLET | Freq: Two times a day (BID) | ORAL | 5 refills | Status: DC

## 2020-04-11 ENCOUNTER — Other Ambulatory Visit: Payer: Self-pay | Admitting: Nurse Practitioner

## 2020-04-11 NOTE — Telephone Encounter (Signed)
Patient wife requested refill Epic LR: 03/10/2020 Contract needs to be updated. Note added to upcoming appointment. Pended Rx and sent to [facility] for approval.

## 2020-04-13 ENCOUNTER — Other Ambulatory Visit: Payer: Self-pay | Admitting: Nurse Practitioner

## 2020-04-13 ENCOUNTER — Other Ambulatory Visit: Payer: Self-pay | Admitting: Cardiovascular Disease

## 2020-04-13 DIAGNOSIS — I10 Essential (primary) hypertension: Secondary | ICD-10-CM

## 2020-04-13 DIAGNOSIS — F5104 Psychophysiologic insomnia: Secondary | ICD-10-CM

## 2020-04-13 DIAGNOSIS — F3342 Major depressive disorder, recurrent, in full remission: Secondary | ICD-10-CM

## 2020-04-13 NOTE — Telephone Encounter (Signed)
Patient is requesting refill on medication " Trazodone 100mg ". Patient last refill was 01/04/2020 with 90 tablets to be taken at bedtime. Patient is due for refill. Medication pend and sent to Marlowe Sax, NP due to PCP Dewaine Oats Carlos American, NP being out of office. Please Advise.

## 2020-04-13 NOTE — Telephone Encounter (Signed)
High risk or very high risk warning populated when attempting to refill medication. RX request sent to PCP for review and approval if warranted.   

## 2020-05-09 NOTE — Progress Notes (Signed)
Patient ID: Phillip Larson, male   DOB: November 14, 1934, 86 y.o.   MRN: [id number]     86 y.o. followup HTN, HLD  and CAD.  He has CAD with a total RCA that is collaterized.   Also with PVD seeing Dr Phillip Larson with right calf claudication ABI right 0.61   Cath done 01/28/13 from right radial approach and no changes with chronically occluded RCA , robust collaterals and no significant left sided disease Echo reviewed from Sep 13 2016 EF 45-50% mild AR And trivial MR   Alzheimers's started on aricept and namenda  Has had vaccine for COVID  He is pleasantly forgetful Discussed foot care with his PVD Wife trims his nails No angina Claudication is stable with moderate walking   ROS: Denies fever, malais, weight loss, blurry vision, decreased visual acuity, cough, sputum, SOB, hemoptysis, pleuritic pain, palpitaitons, heartburn, abdominal pain, melena, lower extremity edema, claudication, or rash.  All other systems reviewed and negative  BP 102/68   Pulse 62   Ht 5\' 11"  (1.803 m)   Wt 79.8 kg   SpO2 95%   BMI 24.55 kg/m  Affect appropriate Healthy:  appears stated age 89: normal Neck supple with no adenopathy JVP normal no bruits no thyromegaly Lungs clear with no wheezing and good diaphragmatic motion Heart:  S1/S2 SEM  murmur, no rub, gallop or click PMI normal Abdomen: benighn, BS positve, no tenderness, no AAA no bruit.  No HSM or HJR Decreased right /PT pulse   No edema Neuro non-focal Skin warm and dry Post left TKR     Current Outpatient Medications  Medication Sig Dispense Refill  . ALPRAZolam (XANAX) 0.25 MG tablet Take 1 tablet (0.25 mg total) by mouth at bedtime. 30 tablet 0  . aspirin EC 81 MG tablet Take 81 mg by mouth at bedtime.    [name] b complex vitamins tablet Take 1 tablet by mouth daily.    . cetirizine (ZYRTEC) 10 MG tablet Take 1 tablet (10 mg total) by mouth daily. 30 tablet 11  . cholecalciferol (VITAMIN D) 1000 UNITS tablet Take 1,000 Units by mouth daily.     [name] diltiazem (CARDIZEM CD) 240 MG 24 hr capsule Take 1 capsule by mouth once daily 90 capsule 2  . galantamine (RAZADYNE ER) 24 MG 24 hr capsule Take one capsule by mouth once daily with breakfast 90 capsule 1  . isosorbide mononitrate (IMDUR) 30 MG 24 hr tablet Take 1 tablet (30 mg total) by mouth daily. 30 tablet 11  . levothyroxine (SYNTHROID) 88 MCG tablet TAKE 1 TABLET BY MOUTH ONCE DAILY ON AN EMPTY STOMACH 30 MINUTES BEFORE BREAKFAST 90 tablet 1  . memantine (NAMENDA) 10 MG tablet Take 1 tablet (10 mg total) by mouth 2 (two) times daily. 180 tablet 1  . mirabegron ER (MYRBETRIQ) 50 MG TB24 tablet Take 50 mg by mouth every other day.    . Multiple Vitamin (MULTIVITAMIN WITH MINERALS) TABS tablet Take 1 tablet by mouth daily.    . nitroGLYCERIN (NITROSTAT) 0.4 MG SL tablet One under the tongue if needed for chest tightness. Repeat in 5 minutes if needed. Repeat again in 5 minutes if needed. 25 tablet 12  . sertraline (ZOLOFT) 50 MG tablet Take 1 tablet by mouth once daily 90 tablet 1  . tamsulosin (FLOMAX) 0.4 MG CAPS capsule TAKE ONE CAPSULE BY MOUTH ONCE DAILY TO HELP RELIEVE OBSTRUCTION 90 capsule 1  . traZODone (DESYREL) 100 MG tablet TAKE 1 TABLET BY MOUTH AT BEDTIME  90 tablet 0  . vitamin C (ASCORBIC ACID) 500 MG tablet Take 500 mg by mouth daily.    [name] zinc gluconate 50 MG tablet Take 50 mg by mouth at bedtime.    [name] zolpidem (AMBIEN) 5 MG tablet Take 1 tablet (5 mg total) by mouth at bedtime. 30 tablet 0   No current facility-administered medications for this visit.    Allergies  Iodine and Iohexol  Electrocardiogram:   05/17/2020 SR rate 7 PVC old IMI no acute changes   Assessment and Plan  CAD:  Chronically occluded RCA , robust collaterals and no significant left sided disease Stable with no angina and good activity level.  Continue medical Rx EF 45-50% by echo 09/13/16    Chol:  Statin d/c due to elevated LFTls    Dementia:  Started on namenda dose cut back due to cost  and aricept f/u Dr Phillip Larson Also Rx Zoloft for depression and ambien for insomnia   HTN:  Well controlled.  Continue current medications and low sodium Dash type diet.    Prostate:  On proscar f/u primary PSA normal 01/21/20   Thyroid:  On replacement TSH normal 12/10/19   Bursitis:  Left shoulder f/u Phillip Larson   Fatigue/Murmur: Echo 09/13/16 trivial MR AV sclerosis and mild AR stable   PVD:  F/u Dr Phillip Larson right calf claudication  ABI 0.61 on right and 0.92 on left F/U [facility]   Note regarding Advance care planning patient is DNR          Phillip Rouge, MD

## 2020-05-10 ENCOUNTER — Other Ambulatory Visit: Payer: Self-pay | Admitting: *Deleted

## 2020-05-10 DIAGNOSIS — F0280 Dementia in other diseases classified elsewhere without behavioral disturbance: Secondary | ICD-10-CM

## 2020-05-10 MED ORDER — MEMANTINE HCL 10 MG PO TABS: 10.0000 mg | ORAL_TABLET | Freq: Two times a day (BID) | ORAL | 1 refills | Status: DC

## 2020-05-10 NOTE — Telephone Encounter (Signed)
Patient wife called requesting refill on medication.  Epic LR: 04/11/2020 Needs updated contract signed, added to upcoming appointment Pended Rx and sent to [facility] for approval.

## 2020-05-11 MED ORDER — ZOLPIDEM TARTRATE 5 MG PO TABS: 5.0000 mg | ORAL_TABLET | Freq: Every day | ORAL | 0 refills | Status: DC

## 2020-05-17 ENCOUNTER — Other Ambulatory Visit: Payer: Self-pay | Admitting: Nurse Practitioner

## 2020-05-17 ENCOUNTER — Other Ambulatory Visit: Payer: Self-pay

## 2020-05-17 ENCOUNTER — Ambulatory Visit: Payer: Medicare Other | Admitting: Cardiovascular Disease

## 2020-05-17 ENCOUNTER — Encounter: Payer: Self-pay | Admitting: Cardiovascular Disease

## 2020-05-17 VITALS — BP 102/68 | HR 62 | Ht 71.0 in | Wt 176.0 lb

## 2020-05-17 DIAGNOSIS — E782 Mixed hyperlipidemia: Secondary | ICD-10-CM

## 2020-05-17 DIAGNOSIS — R945 Abnormal results of liver function studies: Secondary | ICD-10-CM

## 2020-05-17 DIAGNOSIS — I739 Peripheral vascular disease, unspecified: Secondary | ICD-10-CM

## 2020-05-17 DIAGNOSIS — I2510 Atherosclerotic heart disease of native coronary artery without angina pectoris: Secondary | ICD-10-CM

## 2020-05-17 DIAGNOSIS — R7989 Other specified abnormal findings of blood chemistry: Secondary | ICD-10-CM

## 2020-05-17 DIAGNOSIS — I10 Essential (primary) hypertension: Secondary | ICD-10-CM

## 2020-05-17 NOTE — Patient Instructions (Signed)

## 2020-05-23 ENCOUNTER — Other Ambulatory Visit: Payer: Self-pay

## 2020-05-23 ENCOUNTER — Other Ambulatory Visit: Payer: Medicare Other

## 2020-05-23 DIAGNOSIS — I10 Essential (primary) hypertension: Secondary | ICD-10-CM

## 2020-05-23 DIAGNOSIS — R7989 Other specified abnormal findings of blood chemistry: Secondary | ICD-10-CM

## 2020-05-23 DIAGNOSIS — R945 Abnormal results of liver function studies: Secondary | ICD-10-CM | POA: Diagnosis not present

## 2020-05-23 DIAGNOSIS — E782 Mixed hyperlipidemia: Secondary | ICD-10-CM

## 2020-05-23 LAB — COMPLETE METABOLIC PANEL WITH GFR
AG Ratio: 1.6 (calc) (ref 1.0–2.5)
ALT: 35 U/L (ref 9–46)
AST: 37 U/L — ABNORMAL HIGH (ref 10–35)
Albumin: 3.8 g/dL (ref 3.6–5.1)
Alkaline phosphatase (APISO): 110 U/L (ref 35–144)
BUN/Creatinine Ratio: 11 (calc) (ref 6–22)
BUN: 12 mg/dL (ref 7–25)
CO2: 26 mmol/L (ref 20–32)
Calcium: 9.3 mg/dL (ref 8.6–10.3)
Chloride: 107 mmol/L (ref 98–110)
Creat: 1.12 mg/dL — ABNORMAL HIGH (ref 0.70–1.11)
GFR, Est African American: 70 mL/min/{1.73_m2} (ref >=60)
GFR, Est Non African American: 60 mL/min/{1.73_m2} (ref >=60)
Globulin: 2.4 g/dL (calc) (ref 1.9–3.7)
Glucose, Bld: 85 mg/dL (ref 65–99)
Potassium: 3.9 mmol/L (ref 3.5–5.3)
Sodium: 142 mmol/L (ref 135–146)
Total Bilirubin: 0.6 mg/dL (ref 0.2–1.2)
Total Protein: 6.2 g/dL (ref 6.1–8.1)

## 2020-05-23 LAB — CBC WITH DIFFERENTIAL/PLATELET
Absolute Monocytes: 367 cells/uL (ref 200–950)
Basophils Absolute: 32 cells/uL (ref 0–200)
Basophils Relative: 0.6 %
Eosinophils Absolute: 259 cells/uL (ref 15–500)
Eosinophils Relative: 4.8 %
HCT: 45.4 % (ref 38.5–50.0)
Hemoglobin: 15.9 g/dL (ref 13.2–17.1)
Lymphs Abs: 1480 cells/uL (ref 850–3900)
MCH: 34 pg — ABNORMAL HIGH (ref 27.0–33.0)
MCHC: 35 g/dL (ref 32.0–36.0)
MCV: 97 fL (ref 80.0–100.0)
MPV: 11.3 fL (ref 7.5–12.5)
Monocytes Relative: 6.8 %
Neutro Abs: 3262 cells/uL (ref 1500–7800)
Neutrophils Relative %: 60.4 %
Platelets: 214 10*3/uL (ref 140–400)
RBC: 4.68 10*6/uL (ref 4.20–5.80)
RDW: 12.8 % (ref 11.0–15.0)
Total Lymphocyte: 27.4 %
WBC: 5.4 10*3/uL (ref 3.8–10.8)

## 2020-05-23 LAB — LIPID PANEL
Cholesterol: 195 mg/dL (ref <200)
HDL: 64 mg/dL (ref >=40)
LDL Cholesterol (Calc): 108 mg/dL (calc) — ABNORMAL HIGH
Non-HDL Cholesterol (Calc): 131 mg/dL (calc) — ABNORMAL HIGH (ref <130)
Total CHOL/HDL Ratio: 3 (calc) (ref <5.0)
Triglycerides: 115 mg/dL (ref <150)

## 2020-05-26 ENCOUNTER — Ambulatory Visit (INDEPENDENT_AMBULATORY_CARE_PROVIDER_SITE_OTHER): Payer: Medicare Other | Admitting: Nurse Practitioner

## 2020-05-26 ENCOUNTER — Other Ambulatory Visit: Payer: Self-pay

## 2020-05-26 ENCOUNTER — Encounter: Payer: Self-pay | Admitting: Nurse Practitioner

## 2020-05-26 VITALS — BP 100/70 | HR 64 | Temp 97.0°F | Ht 71.0 in | Wt 175.6 lb

## 2020-05-26 DIAGNOSIS — I10 Essential (primary) hypertension: Secondary | ICD-10-CM

## 2020-05-26 DIAGNOSIS — E039 Hypothyroidism, unspecified: Secondary | ICD-10-CM | POA: Diagnosis not present

## 2020-05-26 DIAGNOSIS — R945 Abnormal results of liver function studies: Secondary | ICD-10-CM

## 2020-05-26 DIAGNOSIS — F0280 Dementia in other diseases classified elsewhere without behavioral disturbance: Secondary | ICD-10-CM

## 2020-05-26 DIAGNOSIS — G309 Alzheimer's disease, unspecified: Secondary | ICD-10-CM | POA: Diagnosis not present

## 2020-05-26 DIAGNOSIS — E782 Mixed hyperlipidemia: Secondary | ICD-10-CM

## 2020-05-26 DIAGNOSIS — N400 Enlarged prostate without lower urinary tract symptoms: Secondary | ICD-10-CM

## 2020-05-26 DIAGNOSIS — F5101 Primary insomnia: Secondary | ICD-10-CM | POA: Diagnosis not present

## 2020-05-26 DIAGNOSIS — R634 Abnormal weight loss: Secondary | ICD-10-CM | POA: Diagnosis not present

## 2020-05-26 DIAGNOSIS — R972 Elevated prostate specific antigen [PSA]: Secondary | ICD-10-CM

## 2020-05-26 DIAGNOSIS — I739 Peripheral vascular disease, unspecified: Secondary | ICD-10-CM

## 2020-05-26 DIAGNOSIS — R7989 Other specified abnormal findings of blood chemistry: Secondary | ICD-10-CM

## 2020-05-26 MED ORDER — ROSUVASTATIN CALCIUM 5 MG PO TABS: 5.0000 mg | ORAL_TABLET | Freq: Every day | ORAL | 3 refills | Status: DC

## 2020-05-26 NOTE — Progress Notes (Signed)
Careteam: Patient Care Team: Lauree Chandler, NP as PCP - General (Geriatric Medicine) Josue Hector, MD as PCP - Cardiology (Cardiology) Clent Jacks, MD as Consulting Physician (Ophthalmology) Lamonte Sakai Rose Fillers, MD as Consulting Physician (Pulmonary Disease) Angelia Mould, MD as Consulting Physician (Vascular Surgery) Earlie Server, MD as Consulting Physician (Orthopedic Surgery) Wilford Corner, MD as Consulting Physician (Gastroenterology) Chesley Mires, MD as Consulting Physician (Pulmonary Disease)  PLACE OF SERVICE:  [place] Directive information Does Patient Have a Medical Advance Directive?: Yes, Type of Advance Directive: Out of facility DNR (pink MOST or yellow form), Pre-existing out of facility DNR order (yellow form or pink MOST form): Yellow form placed in chart (order not valid for inpatient use);Pink MOST form placed in chart (order not valid for inpatient use), Does patient want to make changes to medical advance directive?: No - Patient declined  Allergies  Allergen Reactions  . Iodine Swelling    Internal iodine  . Iohexol Hives         Chief Complaint  Patient presents with  . Medical Management of Chronic Issues    4 month follow up.      HPI: Patient is a 86 y.o. male for 4 month follow up.   Hx of elevated liver enzymes- stable on recent labs- he continues to have ETOH use- wine and gin  Insomnia/Depression/anxiety- encouraged to stop ETOH use but continues.  Continues on trazodone, zoloft and Ambien for sleep.  Gave up gin and try to decrease wine but has gone back to drinking.  Wife reports she does not want to be a nag and he does not remember he is supposed to stop.   hyperlipidemia- diet controlled   Hypothyroid- TSH well controlled, continues on synthroid 88 mcg  Overactive bladder/BPH- using myrbetriq 50 mg daily with flomax  Dementia- continues on aricept, galantamine, and namenda.- short term memory is  issue. Always had a hard time recall with names and events.    CAD- followed by cardiology, declines angina, PVD- followed by [facility],  claudication has been stable. Follows yearly.   Wife reports he has not been doing much exercise. Cold in the winter, legs hurt   Review of Systems:  Review of Systems  Constitutional: Negative for chills, fever and weight loss.  HENT: Negative for tinnitus.   Respiratory: Negative for cough, sputum production and shortness of breath.   Cardiovascular: Negative for chest pain, palpitations and leg swelling.  Gastrointestinal: Negative for abdominal pain, constipation, diarrhea and heartburn.  Genitourinary: Negative for dysuria, frequency and urgency.  Musculoskeletal: Positive for myalgias. Negative for back pain, falls and joint pain.  Skin: Negative.   Neurological: Negative for dizziness and headaches.  Psychiatric/Behavioral: Positive for memory loss. Negative for depression. The patient is not nervous/anxious and does not have insomnia.     Past Medical History:  Diagnosis Date  . Actinic keratosis   . Alzheimer's disease ([place])   . Anal fissure   . Anxiety state, unspecified   . Benign neoplasm of colon   . BENIGN PROSTATIC HYPERTROPHY, HX OF   . BPH (benign prostatic hypertrophy) with urinary obstruction 06/22/2008   Qualifier: Diagnosis of  By: Johnsie Cancel, MD, Rona Ravens   . Cerebrovascular disease, unspecified   . Coronary atherosclerosis of native coronary artery   . Depressive disorder, not elsewhere classified   . Diverticulosis of colon (without mention of hemorrhage)   . Essential hypertension, benign   . External hemorrhoids without mention of  complication   . Insomnia, unspecified   . Mixed hyperlipidemia   . Osteoarthrosis, unspecified whether generalized or localized, unspecified site   . Other premature beats   . Other psoriasis   . Pain in joint, lower leg   . Pain in joint, site unspecified   . Palpitations   .  Pressure ulcer   . Reflux esophagitis   . Unspecified essential hypertension   . Unspecified hereditary and idiopathic peripheral neuropathy   . Unspecified hypothyroidism   . Unspecified vitamin D deficiency    Past Surgical History:  Procedure Laterality Date  . CERVICAL DISC SURGERY    . EYE SURGERY Left 02/07/2014   Cataract Surgery Left Eye  . KNEE SURGERY  07/18/2006   Dr French Ana   . LAMINECTOMY C3 disc  1972  . LEFT HEART CATHETERIZATION WITH CORONARY ANGIOGRAM N/A 01/28/2013   Procedure: LEFT HEART CATHETERIZATION WITH CORONARY ANGIOGRAM;  Surgeon: Josue Hector, MD;  Location[facility] C[facility] LAB;  Service: Cardiovascular;  Laterality: N/A;  . MULTIPLE TOOTH EXTRACTIONS  2014  . RETINAL TEAR REPAIR CRYOTHERAPY     Dr Wayne Sever    Social History:   reports that he quit smoking about 28 years ago. His smoking use included cigarettes. He has a 50.00 pack-year smoking history. He has never used smokeless tobacco. He reports current alcohol use. He reports that he does not use drugs.  Family History  Problem Relation Age of Onset  . Cancer Father        pancreatic  . Cancer Mother        pancreatic    Medications: Patient's Medications  New Prescriptions   No medications on file  Previous Medications   ASPIRIN EC 81 MG TABLET    Take 81 mg by mouth at bedtime.   B COMPLEX VITAMINS TABLET    Take 1 tablet by mouth daily.   CETIRIZINE (ZYRTEC) 10 MG TABLET    Take 1 tablet (10 mg total) by mouth daily.   CHOLECALCIFEROL (VITAMIN D) 1000 UNITS TABLET    Take 1,000 Units by mouth daily.   DILTIAZEM (CARDIZEM CD) 240 MG 24 HR CAPSULE    Take 1 capsule by mouth once daily   GALANTAMINE (RAZADYNE ER) 24 MG 24 HR CAPSULE    Take one capsule by mouth once daily with breakfast   ISOSORBIDE MONONITRATE (IMDUR) 30 MG 24 HR TABLET    Take 1 tablet (30 mg total) by mouth daily.   LEVOTHYROXINE (SYNTHROID) 88 MCG TABLET    TAKE 1 TABLET BY MOUTH ONCE DAILY ON AN EMPTY STOMACH 30 MINUTES BEFORE  BREAKFAST   MEMANTINE (NAMENDA) 10 MG TABLET    Take 1 tablet (10 mg total) by mouth 2 (two) times daily.   MIRABEGRON ER (MYRBETRIQ) 50 MG TB24 TABLET    Take 50 mg by mouth every other day.   MULTIPLE VITAMIN (MULTIVITAMIN WITH MINERALS) TABS TABLET    Take 1 tablet by mouth daily.   NITROGLYCERIN (NITROSTAT) 0.4 MG SL TABLET    One under the tongue if needed for chest tightness. Repeat in 5 minutes if needed. Repeat again in 5 minutes if needed.   SERTRALINE (ZOLOFT) 50 MG TABLET    Take 1 tablet by mouth once daily   TAMSULOSIN (FLOMAX) 0.4 MG CAPS CAPSULE    TAKE ONE CAPSULE BY MOUTH ONCE DAILY TO HELP RELIEVE OBSTRUCTION   TRAZODONE (DESYREL) 100 MG TABLET    TAKE 1 TABLET BY MOUTH AT BEDTIME  VITAMIN C (ASCORBIC ACID) 500 MG TABLET    Take 500 mg by mouth daily.   ZINC GLUCONATE 50 MG TABLET    Take 50 mg by mouth at bedtime.   ZOLPIDEM (AMBIEN) 5 MG TABLET    Take 1 tablet (5 mg total) by mouth at bedtime.  Modified Medications   No medications on file  Discontinued Medications   ALPRAZOLAM (XANAX) 0.25 MG TABLET    Take 1 tablet (0.25 mg total) by mouth at bedtime.    Physical Exam:  Vitals:   05/26/20 1120  BP: 100/70  Pulse: 64  Temp: (!) 97 F (36.1 C)  TempSrc: Temporal  SpO2: 94%  Weight: 175 lb 9.6 oz (79.7 kg)  Height: 5' 11"  (1.803 m)   Body mass index is 24.49 kg/m. Wt Readings from Last 3 Encounters:  05/26/20 175 lb 9.6 oz (79.7 kg)  05/17/20 176 lb (79.8 kg)  01/21/20 179 lb (81.2 kg)    Physical Exam Constitutional:      General: He is not in acute distress.    Appearance: He is well-developed and well-nourished. He is not diaphoretic.  HENT:     Head: Normocephalic and atraumatic.     Mouth/Throat:     Mouth: Oropharynx is clear and moist.     Pharynx: No oropharyngeal exudate.  Eyes:     Extraocular Movements: EOM normal.     Conjunctiva/sclera: Conjunctivae normal.     Pupils: Pupils are equal, round, and reactive to light.  Cardiovascular:      Rate and Rhythm: Normal rate and regular rhythm.     Heart sounds: Normal heart sounds.  Pulmonary:     Effort: Pulmonary effort is normal.     Breath sounds: Normal breath sounds.  Abdominal:     General: Bowel sounds are normal.     Palpations: Abdomen is soft.  Musculoskeletal:        General: No tenderness or edema.     Cervical back: Normal range of motion and neck supple.  Skin:    General: Skin is warm and dry.  Neurological:     Mental Status: He is alert. Mental status is at baseline.     Gait: Gait normal.  Psychiatric:        Mood and Affect: Mood and affect and mood normal.     Labs reviewed: Basic Metabolic Panel: Recent Labs    06/18/19 1124 12/10/19 1523 05/23/20 0939  NA 138 137 142  K 4.1 3.9 3.9  CL 101 101 107  CO2 24 27 26   GLUCOSE 93 198* 85  BUN 9 12 12   CREATININE 1.11 1.13* 1.12*  CALCIUM 9.4 9.3 9.3  TSH 2.66 3.01  --    Liver Function Tests: Recent Labs    12/24/19 0902 01/21/20 1153 05/23/20 0939  AST 47* 33 37*  ALT 60* 29 35  BILITOT 0.5 0.5 0.6  PROT 6.6 6.4 6.2   No results for input(s): LIPASE, AMYLASE in the last 8760 hours. No results for input(s): AMMONIA in the last 8760 hours. CBC: Recent Labs    06/18/19 1124 12/10/19 1523 05/23/20 0939  WBC 6.1 6.4 5.4  NEUTROABS 4,008 4,922 3,262  HGB 16.2 16.2 15.9  HCT 46.9 47.4 45.4  MCV 96.9 97.5 97.0  PLT 225 181 214   Lipid Panel: Recent Labs    12/10/19 1523 05/23/20 0939  CHOL 217* 195  HDL 78 64  LDLCALC 119* 108*  TRIG 92 115  CHOLHDL 2.8 3.0  TSH: Recent Labs    06/18/19 1124 12/10/19 1523  TSH 2.66 3.01   A1C: No results found for: HGBA1C   Assessment/Plan 1. Abnormal LFTs -improved on recent labs. Stressed importance of cessation of ETOH. Will restart crestor low dose to to get LDL to goal due to hx of CAD but monitor LFT closely to ensure they do not increase.  - rosuvastatin (CRESTOR) 5 MG tablet; Take 1 tablet (5 mg total) by mouth  daily.  Dispense: 90 tablet; Refill: 3 - CMP with eGFR([facility]); Future - Lipid Panel; Future - CMP with eGFR([facility]); Future  2. Mixed hyperlipidemia LDL goal <70, was stopped previously due to elevated LDL. Now WNL. Will restart statin and monitor LFT closely.  - rosuvastatin (CRESTOR) 5 MG tablet; Take 1 tablet (5 mg total) by mouth daily.  Dispense: 90 tablet; Refill: 3 - CMP with eGFR([facility]); Future - Lipid Panel; Future - CMP with eGFR([facility]); Future - Lipid panel; Future  3. Essential hypertension, benign --well controlled at this time.  - CMP with eGFR([facility]); Future - CBC with Differential/Platelet; Future  4. ALZHEIMERS DISEASE -slow progression of disease, continue on current regimen, wife helps with medication and driving. Expect memory and function status to worsen with disease progression.   5. Weight loss -ongoing weight loss, due to progression of dementia with decrease appetite and oral intake. Encouraged 3 meals a day with protein supplement.   6. Primary insomnia Ongoing but stable, continues to use Ambien but has stopped xanax for sleep. Continues with trazodone. Encouraged cessation of ETOH   7. PVD (peripheral vascular disease) (HCC) Ongoing, symptoms stable. Followed by [facility].   8. Hypothyroidism, unspecified type Continues on synthroid 88 mcg - TSH; Future  9. BPH with elevated PSA -stable, continues on flomax and myrbetriq.   Next appt: 6 months, labs prior  Chandrika Sandles K. [place],  [facility] [phone]

## 2020-05-26 NOTE — Patient Instructions (Signed)
Recommend decreasing alcohol intake. Ideally would be best to stop for memory and sleep and liver  Start crestor 5 mg daily Follow up FASTING labs in 1 month

## 2020-06-12 ENCOUNTER — Other Ambulatory Visit: Payer: Self-pay | Admitting: Nurse Practitioner

## 2020-06-12 NOTE — Telephone Encounter (Signed)
Patient wife called requesting refill Epic LR: 05/11/2020 Contract on [facility] and sent to [place] for approval.

## 2020-06-22 ENCOUNTER — Other Ambulatory Visit: Payer: Self-pay | Admitting: Nurse Practitioner

## 2020-06-23 ENCOUNTER — Other Ambulatory Visit: Payer: Medicare Other

## 2020-06-23 ENCOUNTER — Other Ambulatory Visit: Payer: Self-pay

## 2020-06-23 ENCOUNTER — Other Ambulatory Visit: Payer: Medicare Other | Admitting: Nurse Practitioner

## 2020-06-23 DIAGNOSIS — R945 Abnormal results of liver function studies: Secondary | ICD-10-CM

## 2020-06-23 DIAGNOSIS — E782 Mixed hyperlipidemia: Secondary | ICD-10-CM | POA: Diagnosis not present

## 2020-06-23 DIAGNOSIS — R7989 Other specified abnormal findings of blood chemistry: Secondary | ICD-10-CM

## 2020-06-24 LAB — LIPID PANEL
Cholesterol: 149 mg/dL (ref <200)
HDL: 71 mg/dL (ref >=40)
LDL Cholesterol (Calc): 60 mg/dL (calc)
Non-HDL Cholesterol (Calc): 78 mg/dL (calc) (ref <130)
Total CHOL/HDL Ratio: 2.1 (calc) (ref <5.0)
Triglycerides: 99 mg/dL (ref <150)

## 2020-06-24 LAB — COMPLETE METABOLIC PANEL WITH GFR
AG Ratio: 2.2 (calc) (ref 1.0–2.5)
ALT: 51 U/L — ABNORMAL HIGH (ref 9–46)
AST: 58 U/L — ABNORMAL HIGH (ref 10–35)
Albumin: 3.9 g/dL (ref 3.6–5.1)
Alkaline phosphatase (APISO): 110 U/L (ref 35–144)
BUN/Creatinine Ratio: 10 (calc) (ref 6–22)
BUN: 12 mg/dL (ref 7–25)
CO2: 25 mmol/L (ref 20–32)
Calcium: 9.1 mg/dL (ref 8.6–10.3)
Chloride: 107 mmol/L (ref 98–110)
Creat: 1.2 mg/dL — ABNORMAL HIGH (ref 0.70–1.11)
GFR, Est African American: 64 mL/min/{1.73_m2} (ref >=60)
GFR, Est Non African American: 56 mL/min/{1.73_m2} — ABNORMAL LOW (ref >=60)
Globulin: 1.8 g/dL (calc) — ABNORMAL LOW (ref 1.9–3.7)
Glucose, Bld: 77 mg/dL (ref 65–99)
Potassium: 4 mmol/L (ref 3.5–5.3)
Sodium: 143 mmol/L (ref 135–146)
Total Bilirubin: 0.6 mg/dL (ref 0.2–1.2)
Total Protein: 5.7 g/dL — ABNORMAL LOW (ref 6.1–8.1)

## 2020-07-11 ENCOUNTER — Other Ambulatory Visit: Payer: Self-pay | Admitting: *Deleted

## 2020-07-11 MED ORDER — ZOLPIDEM TARTRATE 5 MG PO TABS: 5.0000 mg | ORAL_TABLET | Freq: Every day | ORAL | 0 refills | Status: DC

## 2020-07-11 NOTE — Telephone Encounter (Signed)
Patient wife requested refill Epic LR: 06/13/2020 Contract on [facility] and sent to [place] for approval.

## 2020-07-17 ENCOUNTER — Other Ambulatory Visit: Payer: Self-pay | Admitting: Family

## 2020-07-17 DIAGNOSIS — F5104 Psychophysiologic insomnia: Secondary | ICD-10-CM

## 2020-07-17 NOTE — Telephone Encounter (Signed)
Pharmacy requested refill. Pended Rx and sent to [place] for approval due to HIGH ALERT Warning.  

## 2020-08-10 ENCOUNTER — Other Ambulatory Visit: Payer: Self-pay | Admitting: Family

## 2020-08-10 NOTE — Telephone Encounter (Signed)
Patient wife called requesting refill Epic LR: 07/11/2020 Contract on File.  Pended Rx and sent to [facility] for approval.

## 2020-08-21 ENCOUNTER — Telehealth: Payer: Self-pay

## 2020-08-21 NOTE — Telephone Encounter (Signed)
Patient daughter Ebony Hail states that she already spoke to Clinical Intake about father having appointment via video visit tomorrow 08/22/2020 with Sherrie Mustache, NP. She states that she's concerned for patient to wait 24hrs to have issue addressed given his age. Patient daughter states that he needs to start treatment now if possible. Message routed to PCP Dewaine Oats, Carlos American, NP. Please Advise.

## 2020-08-21 NOTE — Telephone Encounter (Signed)
Has he tested positive? Is he having symptoms?

## 2020-08-21 NOTE — Telephone Encounter (Signed)
Called patient daughter "Ebony Hail" and verbalized advice from PCP Lauree Chandler, NP . Patient daughter understood and requested that MyChart message be sent to patient so that wife/ him are aware as to what was said as well. Message sent to patient via [place]. Patient daughter agreed to keep appointment 08/22/2020 at 9am.

## 2020-08-21 NOTE — Telephone Encounter (Signed)
Would recommend supportive care at this time. Hopefully symptoms will be mild since he has been vaccinated.  -make sure he is staying well hydrated, would recommend going ahead and start taking Vit C 1000 mg twice daily, Vit D 5000 units, zinc 50 mg daily for 14 days -to do deep breathing exercises every hour, mucinex twice daily for chest congestion with full glass of water  -do not sit in bed all day, sit up in chair and walk around as tolerated If he is having shortness of breath, chest pains, altered level of consciousness, increase confusion, to seek immediate medication attention through the ED or urgent care  -will follow up with him tomorrow via mychart visit

## 2020-08-21 NOTE — Telephone Encounter (Signed)
Called patient daughter "Ebony Hail" and wife. Collectively the information I was given was that patient has bad cough, congestion, and low grade fever that's been ongoing for days. Patient wife states she did home covid test and results are positive. If possible please do virtual visit today.

## 2020-08-22 ENCOUNTER — Telehealth (INDEPENDENT_AMBULATORY_CARE_PROVIDER_SITE_OTHER): Payer: Medicare Other | Admitting: Nurse Practitioner

## 2020-08-22 ENCOUNTER — Other Ambulatory Visit: Payer: Self-pay

## 2020-08-22 DIAGNOSIS — U071 COVID-19: Secondary | ICD-10-CM | POA: Diagnosis not present

## 2020-08-22 NOTE — Progress Notes (Signed)
This service is provided via telemedicine  No vital signs collected/recorded due to the encounter was a telemedicine visit.   Location of patient (ex: home, work):  Home  Patient consents to a telephone visit:  Yes, see encounter dated 12/02/2019  Location of the provider (ex: office, home): [facility]  Name of any referring provider:  N/A  Names of all persons participating in the telemedicine service and their role in the encounter: Sherrie Mustache, Nurse Practitioner, Carroll Kinds, CMA, and patient.   Time spent on call:  8 minutes with medical assistant

## 2020-08-22 NOTE — Progress Notes (Signed)
Careteam: Patient Care Team: Lauree Chandler, NP as PCP - General (Geriatric Medicine) Josue Hector, MD as PCP - Cardiology (Cardiology) Clent Jacks, MD as Consulting Physician (Ophthalmology) Lamonte Sakai Rose Fillers, MD as Consulting Physician (Pulmonary Disease) Angelia Mould, MD as Consulting Physician (Vascular Surgery) Earlie Server, MD as Consulting Physician (Orthopedic Surgery) Wilford Corner, MD as Consulting Physician (Gastroenterology) Chesley Mires, MD as Consulting Physician (Pulmonary Disease)  Advanced Directive information    Allergies  Allergen Reactions  . Iodine Swelling    Internal iodine  . Iohexol Hives         Chief Complaint  Patient presents with  . Acute Visit    Patient positive for COVID on yesterday. Patient I shaving a lot of coughing, fever and congestion. Patient is not bringing up anything when he coughs. No body aches. Patient had some vomiting yesterday and diarrhea. He has been taking Mucinex and Aleve.     HPI: Patient is a 86 y.o. male via virtual visit who tested positive COVID yesterday.  Reports he has a bad cough.  Fever- 99.7 this morning and 100 yesterday.  No aches but having chills.  All in his head.   Wife also tested positive for COVID she went and got antibody infusion. Concerned over his advanced age and history of htn, vascular disease and dementia. Pt also with hx of ETOH abuse but has not been drinking ETOH over the last day due to not feeling well.   Symptoms started last week  But he did not start feeling really bad until 2 days.  Started having a head cold then voice got deep.  Trying to eat and drink well but wife says he has not been good at that.   No shortness of breath or chest pains.  No chest congestion.   Already taking vit d, vit c, and zinc supplement.  Review of Systems:  Review of Systems  Constitutional: Positive for fever and malaise/fatigue. Negative for chills and weight loss.   HENT: Positive for congestion. Negative for hearing loss, sinus pain, sore throat and tinnitus.   Respiratory: Positive for cough. Negative for hemoptysis, sputum production, shortness of breath and stridor.   Cardiovascular: Negative.  Negative for chest pain, palpitations and leg swelling.  Gastrointestinal: Positive for diarrhea. Negative for abdominal pain, constipation and heartburn.  Genitourinary: Negative for dysuria, frequency and urgency.  Musculoskeletal: Negative for back pain, joint pain and myalgias.  Skin: Negative.   Neurological: Negative for dizziness and headaches.  Psychiatric/Behavioral: Positive for memory loss. Negative for depression. The patient does not have insomnia.     Past Medical History:  Diagnosis Date  . Actinic keratosis   . Alzheimer's disease ([place])   . Anal fissure   . Anxiety state, unspecified   . Benign neoplasm of colon   . BENIGN PROSTATIC HYPERTROPHY, HX OF   . BPH (benign prostatic hypertrophy) with urinary obstruction 06/22/2008   Qualifier: Diagnosis of  By: Johnsie Cancel, MD, Rona Ravens   . Cerebrovascular disease, unspecified   . Coronary atherosclerosis of native coronary artery   . Depressive disorder, not elsewhere classified   . Diverticulosis of colon (without mention of hemorrhage)   . Essential hypertension, benign   . External hemorrhoids without mention of complication   . Insomnia, unspecified   . Mixed hyperlipidemia   . Osteoarthrosis, unspecified whether generalized or localized, unspecified site   . Other premature beats   . Other psoriasis   . Pain in joint,  lower leg   . Pain in joint, site unspecified   . Palpitations   . Pressure ulcer   . Reflux esophagitis   . Unspecified essential hypertension   . Unspecified hereditary and idiopathic peripheral neuropathy   . Unspecified hypothyroidism   . Unspecified vitamin D deficiency    Past Surgical History:  Procedure Laterality Date  . CERVICAL DISC SURGERY     . EYE SURGERY Left 02/07/2014   Cataract Surgery Left Eye  . KNEE SURGERY  07/18/2006   Dr French Ana   . LAMINECTOMY C3 disc  1972  . LEFT HEART CATHETERIZATION WITH CORONARY ANGIOGRAM N/A 01/28/2013   Procedure: LEFT HEART CATHETERIZATION WITH CORONARY ANGIOGRAM;  Surgeon: Josue Hector, MD;  Location: [facility] CATH LAB;  Service: Cardiovascular;  Laterality: N/A;  . MULTIPLE TOOTH EXTRACTIONS  2014  . RETINAL TEAR REPAIR CRYOTHERAPY     Dr Wayne Sever    Social History:   reports that he quit smoking about 28 years ago. His smoking use included cigarettes. He has a 50.00 pack-year smoking history. He has never used smokeless tobacco. He reports current alcohol use. He reports that he does not use drugs.  Family History  Problem Relation Age of Onset  . Cancer Father        pancreatic  . Cancer Mother        pancreatic    Medications: Patient's Medications  New Prescriptions   No medications on file  Previous Medications   ASPIRIN EC 81 MG TABLET    Take 81 mg by mouth at bedtime.   B COMPLEX VITAMINS TABLET    Take 1 tablet by mouth daily.   CETIRIZINE (ZYRTEC) 10 MG TABLET    Take 1 tablet (10 mg total) by mouth daily.   CHOLECALCIFEROL (VITAMIN D) 1000 UNITS TABLET    Take 1,000 Units by mouth daily.   DILTIAZEM (CARDIZEM CD) 240 MG 24 HR CAPSULE    Take 1 capsule by mouth once daily   GALANTAMINE (RAZADYNE ER) 24 MG 24 HR CAPSULE    Take one capsule by mouth once daily with breakfast   ISOSORBIDE MONONITRATE (IMDUR) 30 MG 24 HR TABLET    Take 1 tablet (30 mg total) by mouth daily.   LEVOTHYROXINE (SYNTHROID) 88 MCG TABLET    TAKE 1 TABLET BY MOUTH ONCE DAILY ON AN EMPTY STOMACH 30 MINUTES BEFORE BREAKFAST   MEMANTINE (NAMENDA) 10 MG TABLET    Take 1 tablet (10 mg total) by mouth 2 (two) times daily.   MIRABEGRON ER (MYRBETRIQ) 50 MG TB24 TABLET    Take 50 mg by mouth every other day.   MULTIPLE VITAMIN (MULTIVITAMIN WITH MINERALS) TABS TABLET    Take 1 tablet by mouth daily.    NITROGLYCERIN (NITROSTAT) 0.4 MG SL TABLET    One under the tongue if needed for chest tightness. Repeat in 5 minutes if needed. Repeat again in 5 minutes if needed.   SERTRALINE (ZOLOFT) 50 MG TABLET    Take 1 tablet by mouth once daily   TAMSULOSIN (FLOMAX) 0.4 MG CAPS CAPSULE    TAKE ONE CAPSULE BY MOUTH ONCE DAILY TO HELP RELIEVE OBSTRUCTION   TRAZODONE (DESYREL) 100 MG TABLET    TAKE 1 TABLET BY MOUTH AT BEDTIME   VITAMIN C (ASCORBIC ACID) 500 MG TABLET    Take 500 mg by mouth daily.   ZINC GLUCONATE 50 MG TABLET    Take 50 mg by mouth at bedtime.   ZOLPIDEM (AMBIEN) 5 MG  TABLET    TAKE 1 TABLET BY MOUTH AT BEDTIME  Modified Medications   No medications on file  Discontinued Medications   ROSUVASTATIN (CRESTOR) 5 MG TABLET    Take 1 tablet (5 mg total) by mouth daily.    Physical Exam:  There were no vitals filed for this visit. There is no height or weight on file to calculate BMI. Wt Readings from Last 3 Encounters:  05/26/20 175 lb 9.6 oz (79.7 kg)  05/17/20 176 lb (79.8 kg)  01/21/20 179 lb (81.2 kg)    Physical Exam Constitutional:      Appearance: Normal appearance.  Neurological:     Mental Status: He is alert.   limited PE due to virtual visit.   Labs reviewed: Basic Metabolic Panel: Recent Labs    12/10/19 1523 05/23/20 0939 06/23/20 0954  NA 137 142 143  K 3.9 3.9 4.0  CL 101 107 107  CO2 27 26 25   GLUCOSE 198* 85 77  BUN 12 12 12   CREATININE 1.13* 1.12* 1.20*  CALCIUM 9.3 9.3 9.1  TSH 3.01  --   --    Liver Function Tests: Recent Labs    01/21/20 1153 05/23/20 0939 06/23/20 0954  AST 33 37* 58*  ALT 29 35 51*  BILITOT 0.5 0.6 0.6  PROT 6.4 6.2 5.7*   No results for input(s): LIPASE, AMYLASE in the last 8760 hours. No results for input(s): AMMONIA in the last 8760 hours. CBC: Recent Labs    12/10/19 1523 05/23/20 0939  WBC 6.4 5.4  NEUTROABS 4,922 3,262  HGB 16.2 15.9  HCT 47.4 45.4  MCV 97.5 97.0  PLT 181 214   Lipid  Panel: Recent Labs    12/10/19 1523 05/23/20 0939 06/23/20 0954  CHOL 217* 195 149  HDL 78 64 71  LDLCALC 119* 108* 60  TRIG 92 115 99  CHOLHDL 2.8 3.0 2.1   TSH: Recent Labs    12/10/19 1523  TSH 3.01   A1C: No results found for: HGBA1C   Assessment/Plan 1. COVID-19 -positive COVID home test. He is doing well however over the last few days started to feel worse. Continues on supportive care at this time. -education provided on when to follow up and when to seek immediate medication attention through the ED.  -make sure you are staying well hydrated -recommended to take Vit C 1000 mg twice daily, Vit D 5000 units, zinc 50 mg daily for 14 days -mucinex DM twice daily as needed for chest congestion and cough- full glass of water  -can use tylenol 650 mg by mouth every 8 hours as needed fever - Ambulatory referral for Covid Treatment  Eveny Anastas K. Harle Battiest  [facility] [phone]    Virtual Visit via my chart  I connected with patient on 08/22/20 at  9:00 AM EDT by video and verified that I am speaking with the correct person using two identifiers.  Location: Patient: home Provider: psc   I discussed the limitations, risks, security and privacy concerns of performing an evaluation and management service by telephone and the availability of in person appointments. I also discussed with the patient that there may be a patient responsible charge related to this service. The patient expressed understanding and agreed to proceed.   I discussed the assessment and treatment plan with the patient. The patient was provided an opportunity to ask questions and all were answered. The patient agreed with the plan and demonstrated an understanding  of the instructions.   The patient was advised to call back or seek an in-person evaluation if the symptoms worsen or if the condition fails to improve as anticipated.  I provided 15 minutes of  non-face-to-face time during this encounter.  Carlos American. Harle Battiest Avs printed and mailed

## 2020-08-23 ENCOUNTER — Telehealth: Payer: Self-pay | Admitting: Adult Health

## 2020-08-23 NOTE — Telephone Encounter (Signed)
Thank you for the update!

## 2020-08-23 NOTE — Telephone Encounter (Signed)
Called to discuss with patient about COVID-19 symptoms and the use of one of the available treatments for those with mild to moderate Covid symptoms and at a high risk of hospitalization.  Pt appears to qualify for outpatient treatment due to co-morbid conditions and/or a member of an at-risk group in accordance with the FDA Emergency Use Authorization.    Symptom onset: 08/15/2020 Vaccinated: yes Booster? yes Immunocompromised? no Qualifiers: age, CAD, HTN NIH Criteria: 3  Unfortunately, Phillip Larson is outside of the window for treatment for COVID 19.  I reviewed with him and his wife, they expressed their disappointment.  Phone number to the post covid clinic given if he needs any further post covid care as an additional resource in addition to the rec. To f/u with his PCP.     Scot Dock

## 2020-08-23 NOTE — Telephone Encounter (Signed)
Thank you :)

## 2020-09-01 NOTE — Telephone Encounter (Signed)
Message routed to PCP Dewaine Oats, Carlos American, NP . It appears to me that this situation was already addressed by Hematology and Oncology.

## 2020-09-08 ENCOUNTER — Other Ambulatory Visit: Payer: Self-pay | Admitting: Nurse Practitioner

## 2020-09-08 NOTE — Telephone Encounter (Signed)
Medication pended and sent to Windell Moulding, NP for approval

## 2020-09-20 ENCOUNTER — Other Ambulatory Visit: Payer: Self-pay | Admitting: Nurse Practitioner

## 2020-10-06 ENCOUNTER — Other Ambulatory Visit: Payer: Self-pay | Admitting: Orthopedic Surgery

## 2020-10-06 NOTE — Telephone Encounter (Signed)
Wife, Phillip Larson called requesting refill.  Epic LR: 09/08/2020 Contract on File.   Patient is going out of town tomorrow on [place] and need to get refilled before they leave.   Pended Rx and sent to [facility] for approval.

## 2020-10-24 ENCOUNTER — Other Ambulatory Visit: Payer: Self-pay | Admitting: Nurse Practitioner

## 2020-10-24 ENCOUNTER — Other Ambulatory Visit: Payer: Self-pay | Admitting: Family

## 2020-10-24 DIAGNOSIS — F5104 Psychophysiologic insomnia: Secondary | ICD-10-CM

## 2020-10-24 DIAGNOSIS — F3342 Major depressive disorder, recurrent, in full remission: Secondary | ICD-10-CM

## 2020-10-24 NOTE — Telephone Encounter (Signed)
Patient has request refill on medication "Trazodone 100mg ". Patient medication last refilled was 07/17/2020. Patient is due for refill. Medication has High Warnings. Medication pend and sent to PCP Dewaine Oats Carlos American, NP for approval. Please Advise.

## 2020-10-24 NOTE — Telephone Encounter (Signed)
Patient has request refill on medication "Zoloft 50mg ". Medication last refill was 04/13/2020. Patient is due for refill. Medication has High Warning. Medication pend and sent to PCP Dewaine Oats Carlos American, NP for approval. Please Advise.

## 2020-11-07 ENCOUNTER — Other Ambulatory Visit: Payer: Self-pay | Admitting: Nurse Practitioner

## 2020-11-07 NOTE — Telephone Encounter (Signed)
Patient requested refill Epic LR: 10/06/2020 Contract on File Pended Refill and sent to [employer] for approval Phillip Larson out of office)

## 2020-11-22 ENCOUNTER — Other Ambulatory Visit: Payer: Medicare Other

## 2020-11-22 ENCOUNTER — Ambulatory Visit: Payer: Medicare Other | Admitting: Nurse Practitioner

## 2020-11-22 ENCOUNTER — Other Ambulatory Visit: Payer: Self-pay

## 2020-11-22 DIAGNOSIS — R945 Abnormal results of liver function studies: Secondary | ICD-10-CM | POA: Diagnosis not present

## 2020-11-22 DIAGNOSIS — R7989 Other specified abnormal findings of blood chemistry: Secondary | ICD-10-CM

## 2020-11-22 DIAGNOSIS — E039 Hypothyroidism, unspecified: Secondary | ICD-10-CM | POA: Diagnosis not present

## 2020-11-22 DIAGNOSIS — I10 Essential (primary) hypertension: Secondary | ICD-10-CM | POA: Diagnosis not present

## 2020-11-22 DIAGNOSIS — E782 Mixed hyperlipidemia: Secondary | ICD-10-CM

## 2020-11-23 ENCOUNTER — Other Ambulatory Visit: Payer: Medicare Other

## 2020-11-23 LAB — CBC WITH DIFFERENTIAL/PLATELET
Absolute Monocytes: 370 cells/uL (ref 200–950)
Basophils Absolute: 20 cells/uL (ref 0–200)
Basophils Relative: 0.4 %
Eosinophils Absolute: 300 cells/uL (ref 15–500)
Eosinophils Relative: 6 %
HCT: 48.1 % (ref 38.5–50.0)
Hemoglobin: 15.4 g/dL (ref 13.2–17.1)
Lymphs Abs: 1480 cells/uL (ref 850–3900)
MCH: 30.6 pg (ref 27.0–33.0)
MCHC: 32 g/dL (ref 32.0–36.0)
MCV: 95.6 fL (ref 80.0–100.0)
MPV: 11.5 fL (ref 7.5–12.5)
Monocytes Relative: 7.4 %
Neutro Abs: 2830 cells/uL (ref 1500–7800)
Neutrophils Relative %: 56.6 %
Platelets: 233 10*3/uL (ref 140–400)
RBC: 5.03 10*6/uL (ref 4.20–5.80)
RDW: 11.8 % (ref 11.0–15.0)
Total Lymphocyte: 29.6 %
WBC: 5 10*3/uL (ref 3.8–10.8)

## 2020-11-23 LAB — COMPLETE METABOLIC PANEL WITH GFR
AG Ratio: 2.1 (calc) (ref 1.0–2.5)
ALT: 10 U/L (ref 9–46)
AST: 13 U/L (ref 10–35)
Albumin: 4.1 g/dL (ref 3.6–5.1)
Alkaline phosphatase (APISO): 102 U/L (ref 35–144)
BUN/Creatinine Ratio: 8 (calc) (ref 6–22)
BUN: 10 mg/dL (ref 7–25)
CO2: 29 mmol/L (ref 20–32)
Calcium: 9.5 mg/dL (ref 8.6–10.3)
Chloride: 101 mmol/L (ref 98–110)
Creat: 1.3 mg/dL — ABNORMAL HIGH (ref 0.70–1.22)
Globulin: 2 g/dL (calc) (ref 1.9–3.7)
Glucose, Bld: 92 mg/dL (ref 65–99)
Potassium: 4.4 mmol/L (ref 3.5–5.3)
Sodium: 138 mmol/L (ref 135–146)
Total Bilirubin: 0.5 mg/dL (ref 0.2–1.2)
Total Protein: 6.1 g/dL (ref 6.1–8.1)
eGFR: 54 mL/min/{1.73_m2} — ABNORMAL LOW (ref >=60)

## 2020-11-23 LAB — LIPID PANEL
Cholesterol: 206 mg/dL — ABNORMAL HIGH (ref <200)
HDL: 60 mg/dL (ref >=40)
LDL Cholesterol (Calc): 130 mg/dL (calc) — ABNORMAL HIGH
Non-HDL Cholesterol (Calc): 146 mg/dL (calc) — ABNORMAL HIGH (ref <130)
Total CHOL/HDL Ratio: 3.4 (calc) (ref <5.0)
Triglycerides: 68 mg/dL (ref <150)

## 2020-11-23 LAB — TSH: TSH: 4.15 mIU/L (ref 0.40–4.50)

## 2020-11-24 ENCOUNTER — Encounter: Payer: Self-pay | Admitting: Nurse Practitioner

## 2020-11-24 ENCOUNTER — Ambulatory Visit (INDEPENDENT_AMBULATORY_CARE_PROVIDER_SITE_OTHER): Payer: Medicare Other | Admitting: Nurse Practitioner

## 2020-11-24 ENCOUNTER — Other Ambulatory Visit: Payer: Self-pay

## 2020-11-24 VITALS — BP 132/78 | HR 65 | Temp 97.2°F | Ht 71.0 in | Wt 167.8 lb

## 2020-11-24 DIAGNOSIS — F3342 Major depressive disorder, recurrent, in full remission: Secondary | ICD-10-CM

## 2020-11-24 DIAGNOSIS — E039 Hypothyroidism, unspecified: Secondary | ICD-10-CM | POA: Diagnosis not present

## 2020-11-24 DIAGNOSIS — N400 Enlarged prostate without lower urinary tract symptoms: Secondary | ICD-10-CM

## 2020-11-24 DIAGNOSIS — R945 Abnormal results of liver function studies: Secondary | ICD-10-CM

## 2020-11-24 DIAGNOSIS — R634 Abnormal weight loss: Secondary | ICD-10-CM

## 2020-11-24 DIAGNOSIS — I10 Essential (primary) hypertension: Secondary | ICD-10-CM | POA: Diagnosis not present

## 2020-11-24 DIAGNOSIS — F5101 Primary insomnia: Secondary | ICD-10-CM | POA: Diagnosis not present

## 2020-11-24 DIAGNOSIS — I25118 Atherosclerotic heart disease of native coronary artery with other forms of angina pectoris: Secondary | ICD-10-CM

## 2020-11-24 DIAGNOSIS — E782 Mixed hyperlipidemia: Secondary | ICD-10-CM | POA: Diagnosis not present

## 2020-11-24 DIAGNOSIS — R972 Elevated prostate specific antigen [PSA]: Secondary | ICD-10-CM

## 2020-11-24 DIAGNOSIS — G309 Alzheimer's disease, unspecified: Secondary | ICD-10-CM | POA: Diagnosis not present

## 2020-11-24 DIAGNOSIS — R7989 Other specified abnormal findings of blood chemistry: Secondary | ICD-10-CM

## 2020-11-24 DIAGNOSIS — F0280 Dementia in other diseases classified elsewhere without behavioral disturbance: Secondary | ICD-10-CM

## 2020-11-24 MED ORDER — BELSOMRA 10 MG PO TABS: 10.0000 mg | ORAL_TABLET | Freq: Every day | ORAL | 0 refills | Status: DC

## 2020-11-24 MED ORDER — ROSUVASTATIN CALCIUM 5 MG PO TABS: 5.0000 mg | ORAL_TABLET | Freq: Every day | ORAL | 1 refills | Status: DC

## 2020-11-24 NOTE — Progress Notes (Signed)
Careteam: Patient Care Team: Lauree Chandler, NP as PCP - General (Geriatric Medicine) Josue Hector, MD as PCP - Cardiology (Cardiology) Clent Jacks, MD as Consulting Physician (Ophthalmology) Lamonte Sakai Rose Fillers, MD as Consulting Physician (Pulmonary Disease) Angelia Mould, MD as Consulting Physician (Vascular Surgery) Earlie Server, MD as Consulting Physician (Orthopedic Surgery) Wilford Corner, MD as Consulting Physician (Gastroenterology) Chesley Mires, MD as Consulting Physician (Pulmonary Disease)  PLACE OF SERVICE:  [place] Directive information Does Patient Have a Medical Advance Directive?: Yes, Type of Advance Directive: Out of facility DNR (pink MOST or yellow form), Pre-existing out of facility DNR order (yellow form or pink MOST form): Yellow form placed in chart (order not valid for inpatient use);Pink MOST form placed in chart (order not valid for inpatient use), Does patient want to make changes to medical advance directive?: No - Patient declined  Allergies  Allergen Reactions   Iodine Swelling    Internal iodine   Iohexol Hives         Chief Complaint  Patient presents with   Medical Management of Chronic Issues    6 week follow-up and discuss labs (copy printed). Discuss need for shingrix, covid #4, and flu vaccine or exclude. Patient c/o trouble falling asleep. Wife is concerned about weight loss.      HPI: Patient is a 86 y.o. male for routine follow up.   Has stopped drinking liquor but continues to have 2 beers in the evening.   Ckd- worsening Cr. Has a hard time drinking water.   Insomnia- difficulty getting to sleep and wakes up early. Goes to bed at 11.  Trying to walk more during the day- not regularly. Difficulty going up the stairs and down the stairs- gets worn out.   PAD- stable. Continues on asa  Hyperlipidemia- off statin due to elevated liver enzymes.- stable at this time.   Hypothyroid- continues on  synthroid 88 mcg. Tsh at goal.      Review of Systems:  Review of Systems  Constitutional:  Negative for chills, fever and weight loss.  HENT:  Negative for hearing loss and tinnitus.   Respiratory:  Negative for cough, sputum production and shortness of breath.   Cardiovascular:  Negative for chest pain, palpitations and leg swelling.  Gastrointestinal:  Negative for abdominal pain, constipation, diarrhea and heartburn.  Genitourinary:  Positive for frequency. Negative for dysuria and urgency.  Musculoskeletal:  Negative for back pain, falls, joint pain and myalgias.  Skin: Negative.   Neurological:  Negative for dizziness and headaches.  Psychiatric/Behavioral:  Positive for memory loss. Negative for depression. The patient is nervous/anxious and has insomnia.    Past Medical History:  Diagnosis Date   Actinic keratosis    Alzheimer's disease ([place])    Anal fissure    Anxiety state, unspecified    Benign neoplasm of colon    BENIGN PROSTATIC HYPERTROPHY, HX OF    BPH (benign prostatic hypertrophy) with urinary obstruction 06/22/2008   Qualifier: Diagnosis of  By: Johnsie Cancel, MD, Rona Ravens    Cerebrovascular disease, unspecified    Coronary atherosclerosis of native coronary artery    Depressive disorder, not elsewhere classified    Diverticulosis of colon (without mention of hemorrhage)    Essential hypertension, benign    External hemorrhoids without mention of complication    Insomnia, unspecified    Mixed hyperlipidemia    Osteoarthrosis, unspecified whether generalized or localized, unspecified site    Other premature beats  Other psoriasis    Pain in joint, lower leg    Pain in joint, site unspecified    Palpitations    Pressure ulcer    Reflux esophagitis    Unspecified essential hypertension    Unspecified hereditary and idiopathic peripheral neuropathy    Unspecified hypothyroidism    Unspecified vitamin D deficiency    Past Surgical History:   Procedure Laterality Date   CERVICAL DISC SURGERY     EYE SURGERY Left 02/07/2014   Cataract Surgery Left Eye   KNEE SURGERY  07/18/2006   Dr French Ana    LAMINECTOMY C3 disc  1972   LEFT HEART CATHETERIZATION WITH CORONARY ANGIOGRAM N/A 01/28/2013   Procedure: LEFT HEART CATHETERIZATION WITH CORONARY ANGIOGRAM;  Surgeon: Josue Hector, MD;  Location[facility] CATH LAB;  Service: Cardiovascular;  Laterality: N/A;   MULTIPLE TOOTH EXTRACTIONS  2014   RETINAL TEAR REPAIR CRYOTHERAPY     Dr Wayne Sever    Social History:   reports that he quit smoking about 28 years ago. His smoking use included cigarettes. He has a 50.00 pack-year smoking history. He has never used smokeless tobacco. He reports current alcohol use. He reports that he does not use drugs.  Family History  Problem Relation Age of Onset   Cancer Father        pancreatic   Cancer Mother        pancreatic    Medications: Patient's Medications  New Prescriptions   No medications on file  Previous Medications   ASPIRIN EC 81 MG TABLET    Take 81 mg by mouth at bedtime.   B COMPLEX VITAMINS TABLET    Take 1 tablet by mouth daily.   CETIRIZINE (ZYRTEC) 10 MG TABLET    Take 1 tablet (10 mg total) by mouth daily.   CHOLECALCIFEROL (VITAMIN D) 1000 UNITS TABLET    Take 1,000 Units by mouth daily.   DILTIAZEM (CARDIZEM CD) 240 MG 24 HR CAPSULE    Take 1 capsule by mouth once daily   GALANTAMINE (RAZADYNE ER) 24 MG 24 HR CAPSULE    Take one capsule by mouth once daily with breakfast   ISOSORBIDE MONONITRATE (IMDUR) 30 MG 24 HR TABLET    Take 1 tablet (30 mg total) by mouth daily.   LEVOTHYROXINE (SYNTHROID) 88 MCG TABLET    TAKE 1 TABLET BY MOUTH IN THE MORNING 30 MINUTES BEFORE BREAKFAST ON AN EMPTY STOMACH   MEMANTINE (NAMENDA) 10 MG TABLET    Take 1 tablet (10 mg total) by mouth 2 (two) times daily.   MIRABEGRON ER (MYRBETRIQ) 50 MG TB24 TABLET    Take 50 mg by mouth every other day.   MULTIPLE VITAMIN (MULTIVITAMIN WITH MINERALS) TABS  TABLET    Take 1 tablet by mouth daily.   NITROGLYCERIN (NITROSTAT) 0.4 MG SL TABLET    One under the tongue if needed for chest tightness. Repeat in 5 minutes if needed. Repeat again in 5 minutes if needed.   SERTRALINE (ZOLOFT) 50 MG TABLET    Take 1 tablet by mouth once daily   TAMSULOSIN (FLOMAX) 0.4 MG CAPS CAPSULE    TAKE ONE CAPSULE BY MOUTH ONCE DAILY TO HELP RELIEVE OBSTRUCTION   TRAZODONE (DESYREL) 100 MG TABLET    TAKE 1 TABLET BY MOUTH AT BEDTIME   VITAMIN C (ASCORBIC ACID) 500 MG TABLET    Take 500 mg by mouth daily.   ZINC GLUCONATE 50 MG TABLET    Take 50 mg by  mouth at bedtime.   ZOLPIDEM (AMBIEN) 5 MG TABLET    Take one tablet by mouth once daily at bedtime.  Modified Medications   No medications on file  Discontinued Medications   No medications on file    Physical Exam:  Vitals:   11/24/20 0901  BP: (!) 142/80  Pulse: 65  Temp: (!) 97.2 F (36.2 C)  TempSrc: Temporal  SpO2: 99%  Weight: 167 lb 12.8 oz (76.1 kg)  Height: '5\' 11"'$  (1.803 m)   Body mass index is 23.4 kg/m. Wt Readings from Last 3 Encounters:  11/24/20 167 lb 12.8 oz (76.1 kg)  05/26/20 175 lb 9.6 oz (79.7 kg)  05/17/20 176 lb (79.8 kg)    Physical Exam Constitutional:      General: He is not in acute distress.    Appearance: He is well-developed. He is not diaphoretic.  HENT:     Head: Normocephalic and atraumatic.     Right Ear: External ear normal.     Left Ear: External ear normal.     Mouth/Throat:     Pharynx: No oropharyngeal exudate.  Eyes:     Conjunctiva/sclera: Conjunctivae normal.     Pupils: Pupils are equal, round, and reactive to light.  Cardiovascular:     Rate and Rhythm: Normal rate and regular rhythm.     Heart sounds: Normal heart sounds.  Pulmonary:     Effort: Pulmonary effort is normal.     Breath sounds: Normal breath sounds.  Abdominal:     General: Bowel sounds are normal.     Palpations: Abdomen is soft.  Musculoskeletal:        General: No tenderness.      Cervical back: Normal range of motion and neck supple.     Right lower leg: No edema.     Left lower leg: No edema.  Skin:    General: Skin is warm and dry.  Neurological:     Mental Status: He is alert. Mental status is at baseline.  Psychiatric:        Mood and Affect: Mood normal.    Labs reviewed: Basic Metabolic Panel: Recent Labs    12/10/19 1523 05/23/20 0939 06/23/20 0954 11/22/20 0920  NA 137 142 143 138  K 3.9 3.9 4.0 4.4  CL 101 107 107 101  CO2 '27 26 25 29  '$ GLUCOSE 198* 85 77 92  BUN '12 12 12 10  '$ CREATININE 1.13* 1.12* 1.20* 1.30*  CALCIUM 9.3 9.3 9.1 9.5  TSH 3.01  --   --  4.15   Liver Function Tests: Recent Labs    05/23/20 0939 06/23/20 0954 11/22/20 0920  AST 37* 58* 13  ALT 35 51* 10  BILITOT 0.6 0.6 0.5  PROT 6.2 5.7* 6.1   No results for input(s): LIPASE, AMYLASE in the last 8760 hours. No results for input(s): AMMONIA in the last 8760 hours. CBC: Recent Labs    12/10/19 1523 05/23/20 0939 11/22/20 0920  WBC 6.4 5.4 5.0  NEUTROABS 4,922 3,262 2,830  HGB 16.2 15.9 15.4  HCT 47.4 45.4 48.1  MCV 97.5 97.0 95.6  PLT 181 214 233   Lipid Panel: Recent Labs    05/23/20 0939 06/23/20 0954 11/22/20 0920  CHOL 195 149 206*  HDL 64 71 60  LDLCALC 108* 60 130*  TRIG 115 99 68  CHOLHDL 3.0 2.1 3.4   TSH: Recent Labs    12/10/19 1523 11/22/20 0920  TSH 3.01 4.15   A1C: No  results found for: HGBA1C   Assessment/Plan 1. Abnormal LFTs -in normal range at this time. Statin stopped and ETOH reduced. Will follow up since adding back statin. - COMPLETE METABOLIC PANEL WITH GFR; Future  2. Mixed hyperlipidemia -not at goal. With CAD will add back crestor three times weekly and recheck liver enzymes.  - rosuvastatin (CRESTOR) 5 MG tablet; Take 1 tablet (5 mg total) by mouth daily.  Dispense: 30 tablet; Refill: 1 - COMPLETE METABOLIC PANEL WITH GFR; Future  3. Primary insomnia Ongoing, sleep information provided -will stop  ambien and add belsomra - Suvorexant (BELSOMRA) 10 MG TABS; Take 10 mg by mouth at bedtime.  Dispense: 30 tablet; Refill: 0  4. Essential hypertension, benign  -stable. Goal bp <140/90. Continue on cardiem and imdur with low sodium diet.    5. ALZHEIMERS DISEASE -stable, no acute changes in cogntiive or functional status. Continues on galentamine and namenda.   6. Weight loss Noted today. He has significantly cut back on ETOH which is lost calories but does not eat 3 meals a day- encouraged to increase to 3 meals a day with proper protein and complex carbs in diet. To add ensure to smallest meal.  7. Hypothyroidism, unspecified type -tsh at goal continue current synthroid dose.   8. BPH with elevated PSA -ongoing frequency but stable. Continue current regimen.   9. depressive disorder, in full remission ([place]) Stable on zoloft  10. Atherosclerosis of native coronary artery of native heart with stable angina pectoris ([place]) -stable at this time. No chest pains noted. Continues on imdur with asa. Has follow up scheduled with cardiologist for next week.    Next appt: 6 weeks, labs prior Octavia Velador K. [place], [facility] [phone]

## 2020-11-24 NOTE — Patient Instructions (Addendum)
Start crestor 5 mg by mouth three times weekly in the evening.  Stop [employer] belsomra   Follow up in 6 weeks, labs prior to appt    Make sure you are eating 3 meals a day with good protein intake.

## 2020-11-29 ENCOUNTER — Encounter: Payer: Self-pay | Admitting: Physician Assistant

## 2020-11-29 ENCOUNTER — Ambulatory Visit: Payer: Medicare Other | Admitting: Physician Assistant

## 2020-11-29 ENCOUNTER — Other Ambulatory Visit: Payer: Self-pay

## 2020-11-29 VITALS — BP 120/60 | HR 72 | Ht 71.0 in | Wt 169.6 lb

## 2020-11-29 DIAGNOSIS — I255 Ischemic cardiomyopathy: Secondary | ICD-10-CM

## 2020-11-29 DIAGNOSIS — R011 Cardiac murmur, unspecified: Secondary | ICD-10-CM | POA: Diagnosis not present

## 2020-11-29 DIAGNOSIS — G309 Alzheimer's disease, unspecified: Secondary | ICD-10-CM

## 2020-11-29 DIAGNOSIS — I2510 Atherosclerotic heart disease of native coronary artery without angina pectoris: Secondary | ICD-10-CM

## 2020-11-29 DIAGNOSIS — I10 Essential (primary) hypertension: Secondary | ICD-10-CM

## 2020-11-29 DIAGNOSIS — E782 Mixed hyperlipidemia: Secondary | ICD-10-CM | POA: Diagnosis not present

## 2020-11-29 DIAGNOSIS — F0280 Dementia in other diseases classified elsewhere without behavioral disturbance: Secondary | ICD-10-CM

## 2020-11-29 DIAGNOSIS — I739 Peripheral vascular disease, unspecified: Secondary | ICD-10-CM | POA: Diagnosis not present

## 2020-11-29 NOTE — Patient Instructions (Signed)
Medication Instructions:  Your physician recommends that you continue on your current medications as directed. Please refer to the Current Medication list given to you today.  Labwork: None ordered.  Testing/Procedures: Your physician has requested that you have an echocardiogram. Echocardiography is a painless test that uses sound waves to create images of your heart. It provides your doctor with information about the size and shape of your heart and how well your heart's chambers and valves are working. This procedure takes approximately one hour. There are no restrictions for this procedure.  Follow-Up:  Your physician recommends that you schedule a follow-up appointment in:   6 months with Dr. Johnsie Cancel   Any Other Special Instructions Will Be Listed Below (If Applicable).     If you need a refill on your cardiac medications before your next appointment, please call your pharmacy.

## 2020-11-29 NOTE — Progress Notes (Signed)
Cardiology Office Note:    Date:  11/29/2020   ID:  Phillip Larson, DOB [birth year], MRN [id number]  PCP:  Phillip Chandler, NP   [facility] Providers Cardiologist:  Phillip Rouge, MD      Referring MD: Phillip Chandler, NP   Chief Complaint:  Follow-up (CAD)    Patient Profile:    Phillip Larson is a 86 y.o. male with:  Coronary artery disease Cath 10/14: CTO of the RCA with left-to-right collaterals Ischemic CM Echo 5/18: EF 45-50, inferior HK, GR 1 DD Mild aortic insufficiency Peripheral arterial disease ABIs/US 8/21: R 0.61; bilateral SFA disease (followed by [facility]) Hypertension Hyperlipidemia Hypothyroidism BPH Alzheimer's dementia DNR  Prior CV studies: Echocardiogram 09/13/16 Mild LVH, inferolateral HK, basal to mid inferior HK, EF 45-50, G1 DD, mild AI, AV sclerosis without stenosis, trivial MR, moderate LAE, normal RVSF, mild RAE  Cardiac catheterization 01/28/2013 LM 20 LAD 30% proximal 30% mid  S LCx  40% proximal and mid  RCA:  Known proximial CTO with good left to right collaterals EF 55% mild inferobasal wall hypokinesis     History of Present Illness: Phillip Larson was last seen by Phillip Larson in 1/22.  He returns for follow-up.  He is here alone.  Overall, he has been doing well without chest pain, significant shortness of breath, syncope, orthopnea or leg edema.  He continues to have issues with left leg claudication.  He continues to follow with vascular surgery.        Past Medical History:  Diagnosis Date   Actinic keratosis    Alzheimer's disease ([place])    Anal fissure    Anxiety state, unspecified    Benign neoplasm of colon    BENIGN PROSTATIC HYPERTROPHY, HX OF    BPH (benign prostatic hypertrophy) with urinary obstruction 06/22/2008   Qualifier: Diagnosis of  By: Johnsie Cancel, MD, Rona Ravens    Cerebrovascular disease, unspecified    Coronary atherosclerosis of native coronary artery    Depressive disorder, not elsewhere classified     Diverticulosis of colon (without mention of hemorrhage)    Essential hypertension, benign    External hemorrhoids without mention of complication    Insomnia, unspecified    Mixed hyperlipidemia    Osteoarthrosis, unspecified whether generalized or localized, unspecified site    Other premature beats    Other psoriasis    Pain in joint, lower leg    Pain in joint, site unspecified    Palpitations    Pressure ulcer    Reflux esophagitis    Unspecified essential hypertension    Unspecified hereditary and idiopathic peripheral neuropathy    Unspecified hypothyroidism    Unspecified vitamin D deficiency     Current Medications: Current Meds  Medication Sig   aspirin EC 81 MG tablet Take 81 mg by mouth at bedtime.   b complex vitamins tablet Take 1 tablet by mouth daily.   cetirizine (ZYRTEC) 10 MG tablet Take 1 tablet (10 mg total) by mouth daily.   cholecalciferol (VITAMIN D) 1000 UNITS tablet Take 1,000 Units by mouth daily.   diazepam (VALIUM) 2 MG tablet Take 2 mg by mouth every 6 (six) hours as needed for anxiety.   diltiazem (CARDIZEM CD) 240 MG 24 hr capsule Take 1 capsule by mouth once daily   galantamine (RAZADYNE ER) 24 MG 24 hr capsule Take one capsule by mouth once daily with breakfast   isosorbide mononitrate (IMDUR) 30 MG 24 hr tablet Take 1 tablet (  30 mg total) by mouth daily.   levothyroxine (SYNTHROID) 88 MCG tablet TAKE 1 TABLET BY MOUTH IN THE MORNING 30 MINUTES BEFORE BREAKFAST ON AN EMPTY STOMACH   memantine (NAMENDA) 10 MG tablet Take 1 tablet (10 mg total) by mouth 2 (two) times daily.   mirabegron ER (MYRBETRIQ) 50 MG TB24 tablet Take 50 mg by mouth every other day.   Multiple Vitamin (MULTIVITAMIN WITH MINERALS) TABS tablet Take 1 tablet by mouth daily.   nitroGLYCERIN (NITROSTAT) 0.4 MG SL tablet One under the tongue if needed for chest tightness. Repeat in 5 minutes if needed. Repeat again in 5 minutes if needed.   rosuvastatin (CRESTOR) 5 MG tablet Take 1  tablet (5 mg total) by mouth daily.   sertraline (ZOLOFT) 50 MG tablet Take 1 tablet by mouth once daily   Suvorexant (BELSOMRA) 10 MG TABS Take 10 mg by mouth at bedtime.   tamsulosin (FLOMAX) 0.4 MG CAPS capsule TAKE ONE CAPSULE BY MOUTH ONCE DAILY TO HELP RELIEVE OBSTRUCTION   traZODone (DESYREL) 100 MG tablet TAKE 1 TABLET BY MOUTH AT BEDTIME   vitamin C (ASCORBIC ACID) 500 MG tablet Take 500 mg by mouth daily.   zinc gluconate 50 MG tablet Take 50 mg by mouth at bedtime.     Allergies:   Iodine and Iohexol   Social History   Tobacco Use   Smoking status: Former    Packs/day: 2.00    Years: 25.00    Pack years: 50.00    Types: Cigarettes    Quit date: 04/22/1992    Years since quitting: 28.6   Smokeless tobacco: Never  Vaping Use   Vaping Use: Never used  Substance Use Topics   Alcohol use: Yes    Comment: beer   Drug use: No     Family Hx: The patient's family history includes Cancer in his father and mother.  Review of Systems  Gastrointestinal:  Negative for hematochezia.  Genitourinary:  Negative for hematuria.    EKGs/Labs/Other Test Reviewed:    EKG:  EKG is not ordered today.  The ekg ordered today demonstrates n/a  Recent Labs: 11/22/2020: ALT 10; BUN 10; Creat 1.30; Hemoglobin 15.4; Platelets 233; Potassium 4.4; Sodium 138; TSH 4.15   Recent Lipid Panel Lab Results  Component Value Date/Time   CHOL 206 (H) 11/22/2020 09:20 AM   TRIG 68 11/22/2020 09:20 AM   HDL 60 11/22/2020 09:20 AM   LDLCALC 130 (H) 11/22/2020 09:20 AM      Risk Assessment/Calculations:      Physical Exam:    VS:  BP 120/60   Pulse 72   Ht '5\' 11"'$  (1.803 m)   Wt 169 lb 9.6 oz (76.9 kg)   SpO2 96%   BMI 23.65 kg/m     Wt Readings from Last 3 Encounters:  11/29/20 169 lb 9.6 oz (76.9 kg)  11/24/20 167 lb 12.8 oz (76.1 kg)  05/26/20 175 lb 9.6 oz (79.7 kg)     Constitutional:      Appearance: Healthy appearance. Not in distress.  Neck:     Vascular: JVD normal.   Pulmonary:     Effort: Pulmonary effort is normal.     Breath sounds: No wheezing. No rales.  Cardiovascular:     Normal rate. Regular rhythm. Normal S1. Normal S2.      Murmurs: There is a grade 2/6 crescendo-decrescendo systolic murmur at the URSB.  Edema:    Peripheral edema absent.  Abdominal:  Palpations: Abdomen is soft.  Skin:    General: Skin is warm and dry.  Neurological:     General: No focal deficit present.     Mental Status: Alert.     Cranial Nerves: Cranial nerves are intact.         ASSESSMENT & PLAN:    1. Coronary artery disease involving native coronary artery of native heart without angina pectoris History of known chronic total occlusion of the RCA with left right collaterals.  He is doing well without anginal symptoms.  Continue aspirin, statin therapy.  Continue current dose of diltiazem, isosorbide.  2. Ischemic cardiomyopathy EF 45-50.  NYHA II.  Volume status stable.  3. PAD (peripheral artery disease) ([place]) He notes left leg claudication.  He continues to follow with vascular surgery.  We discussed the role of a walking program.  He has been walking 3 times a week and stopping once pain occurs.  I have advised him to try to walk more often and to continue walking after the onset of pain for a few feet before he stops.  4. Essential hypertension, benign The patient's blood pressure is controlled on his current regimen.  Continue current therapy.   5. Mixed hyperlipidemia His rosuvastatin was recently stopped due to elevated LFTs.  His most recent LDL was above goal at 130.  He is now back on rosuvastatin.  Would recommend continuing rosuvastatin unless his ALT increases to >3 times upper limit of normal.     6. Alzheimer's Disease Management per primary care.  7. Murmur, cardiac He had mild aortic insufficiency and AV sclerosis without stenosis on echocardiogram in 2018.  His murmur today sounds more like aortic stenosis.  Since it has been 4  years since his last echocardiogram, I will arrange a follow-up 2D echocardiogram.       Dispo:  No follow-ups on file.   Medication Adjustments/Labs and Tests Ordered: Current medicines are reviewed at length with the patient today.  Concerns regarding medicines are outlined above.  Tests Ordered: No orders of the defined types were placed in this encounter.  Medication Changes: No orders of the defined types were placed in this encounter.   Signed, Richardson Dopp, PA-C  11/29/2020 3:26 PM    [facility] [address], [place], [place]  [address] Phone: [phone]; Fax: [phone]

## 2020-12-07 ENCOUNTER — Other Ambulatory Visit: Payer: Self-pay

## 2020-12-07 ENCOUNTER — Ambulatory Visit (HOSPITAL_COMMUNITY): Payer: Medicare Other | Attending: Cardiovascular Disease

## 2020-12-07 DIAGNOSIS — R011 Cardiac murmur, unspecified: Secondary | ICD-10-CM | POA: Insufficient documentation

## 2020-12-07 LAB — ECHOCARDIOGRAM COMPLETE
AR max vel: 1.22 cm2
AV Area VTI: 1.16 cm2
AV Area mean vel: 1.13 cm2
AV Mean grad: 12 mmHg
AV Peak grad: 23.8 mmHg
Ao pk vel: 2.44 m/s
Area-P 1/2: 3.33 cm2
P 1/2 time: 470 msec
S' Lateral: 3.6 cm

## 2020-12-13 ENCOUNTER — Telehealth: Payer: Self-pay

## 2020-12-13 NOTE — Telephone Encounter (Signed)
Patient wife "Harvard Quattrochi" called and notified. She states that she will try this and call us back if she needs anything else.

## 2020-12-13 NOTE — Telephone Encounter (Signed)
Please let Peggy know that they can increase the belsomra to 20 mg by mouth at bedtime to see if that works better. She can have him take 2 of the 10 mg tablets.

## 2020-12-13 NOTE — Telephone Encounter (Signed)
Great - thanks

## 2020-12-13 NOTE — Telephone Encounter (Signed)
Patient wife "Ilia Doudna" called and states that medication "Belsomra" isn't working. Patient wife states he wants to go back to "Ambien". Message routed to PCP Dewaine Oats, Carlos American, NP.

## 2020-12-19 ENCOUNTER — Telehealth: Payer: Self-pay | Admitting: *Deleted

## 2020-12-19 MED ORDER — ZOLPIDEM TARTRATE 5 MG PO TABS: 5.0000 mg | ORAL_TABLET | Freq: Every day | ORAL | 0 refills | Status: DC

## 2020-12-19 NOTE — Telephone Encounter (Signed)
Patient wife notified and agreed.  Pended Rx and sent to [facility] for approval.

## 2020-12-19 NOTE — Telephone Encounter (Signed)
Okay to restart Ambien 5 mg by mouth at bedtime. To stop belsomra.

## 2020-12-19 NOTE — Telephone Encounter (Signed)
Patient wife, Vickii Chafe, called and stated that patient has tried to Increase the Belsomra to '20mg'$ . Stated that it makes him Dizzy. Stopped the '20mg'$  and still not sleeping.   Please Advise.

## 2020-12-27 ENCOUNTER — Other Ambulatory Visit: Payer: Self-pay | Admitting: Nurse Practitioner

## 2020-12-27 MED ORDER — LEVOTHYROXINE SODIUM 88 MCG PO TABS: ORAL_TABLET | ORAL | 1 refills | Status: DC

## 2021-01-01 ENCOUNTER — Other Ambulatory Visit: Payer: Self-pay

## 2021-01-01 ENCOUNTER — Other Ambulatory Visit: Payer: Medicare Other

## 2021-01-01 DIAGNOSIS — R7989 Other specified abnormal findings of blood chemistry: Secondary | ICD-10-CM

## 2021-01-01 DIAGNOSIS — E782 Mixed hyperlipidemia: Secondary | ICD-10-CM

## 2021-01-01 DIAGNOSIS — R945 Abnormal results of liver function studies: Secondary | ICD-10-CM

## 2021-01-01 DIAGNOSIS — E785 Hyperlipidemia, unspecified: Secondary | ICD-10-CM | POA: Diagnosis not present

## 2021-01-04 LAB — COMPLETE METABOLIC PANEL WITH GFR
AG Ratio: 2.1 (calc) (ref 1.0–2.5)
ALT: 13 U/L (ref 9–46)
AST: 18 U/L (ref 10–35)
Albumin: 4.2 g/dL (ref 3.6–5.1)
Alkaline phosphatase (APISO): 100 U/L (ref 35–144)
BUN: 11 mg/dL (ref 7–25)
CO2: 29 mmol/L (ref 20–32)
Calcium: 9.6 mg/dL (ref 8.6–10.3)
Chloride: 104 mmol/L (ref 98–110)
Creat: 1.08 mg/dL (ref 0.70–1.22)
Globulin: 2 g/dL (calc) (ref 1.9–3.7)
Glucose, Bld: 83 mg/dL (ref 65–99)
Potassium: 4.1 mmol/L (ref 3.5–5.3)
Sodium: 140 mmol/L (ref 135–146)
Total Bilirubin: 0.6 mg/dL (ref 0.2–1.2)
Total Protein: 6.2 g/dL (ref 6.1–8.1)
eGFR: 68 mL/min/{1.73_m2} (ref >=60)

## 2021-01-04 LAB — LIPID PANEL
Cholesterol: 168 mg/dL (ref <200)
HDL: 65 mg/dL (ref >=40)
LDL Cholesterol (Calc): 83 mg/dL (calc)
Non-HDL Cholesterol (Calc): 103 mg/dL (calc) (ref <130)
Total CHOL/HDL Ratio: 2.6 (calc) (ref <5.0)
Triglycerides: 104 mg/dL (ref <150)

## 2021-01-04 LAB — TEST AUTHORIZATION

## 2021-01-05 ENCOUNTER — Ambulatory Visit: Payer: Medicare Other | Admitting: Nurse Practitioner

## 2021-01-11 DIAGNOSIS — R3915 Urgency of urination: Secondary | ICD-10-CM | POA: Diagnosis not present

## 2021-01-18 ENCOUNTER — Other Ambulatory Visit: Payer: Self-pay | Admitting: Cardiovascular Disease

## 2021-01-18 ENCOUNTER — Other Ambulatory Visit: Payer: Self-pay | Admitting: Family

## 2021-01-18 DIAGNOSIS — I10 Essential (primary) hypertension: Secondary | ICD-10-CM

## 2021-01-18 MED ORDER — DILTIAZEM HCL ER COATED BEADS 240 MG PO CP24: 240.0000 mg | ORAL_CAPSULE | Freq: Every day | ORAL | 3 refills | Status: DC

## 2021-01-18 NOTE — Telephone Encounter (Signed)
Patient has request refill on medication "Ambien". Patient last refill was 12/19/2020. Patient is due for refill. Medication pend and sent to PCP Dewaine Oats Carlos American, NP for approval. Please Advise.

## 2021-01-22 ENCOUNTER — Other Ambulatory Visit: Payer: Self-pay | Admitting: Nurse Practitioner

## 2021-01-22 DIAGNOSIS — F5104 Psychophysiologic insomnia: Secondary | ICD-10-CM

## 2021-01-22 NOTE — Telephone Encounter (Signed)
Patient request refill on medication "Trazodone". Medication last refilled on 10/24/2020. Patient medication has High Risk Warnings. Medication pend and sent to PCP Dewaine Oats Carlos American, NP for approval. Please Advise.

## 2021-01-30 ENCOUNTER — Encounter: Payer: Self-pay | Admitting: Nurse Practitioner

## 2021-01-30 ENCOUNTER — Telehealth: Payer: Self-pay

## 2021-01-30 ENCOUNTER — Other Ambulatory Visit: Payer: Self-pay

## 2021-01-30 ENCOUNTER — Ambulatory Visit (INDEPENDENT_AMBULATORY_CARE_PROVIDER_SITE_OTHER): Payer: Medicare Other | Admitting: Nurse Practitioner

## 2021-01-30 DIAGNOSIS — Z0000 Encounter for general adult medical examination without abnormal findings: Secondary | ICD-10-CM

## 2021-01-30 NOTE — Patient Instructions (Addendum)
Phillip Larson , Thank you for taking time to come for your Medicare Wellness Visit. I appreciate your ongoing commitment to your health goals. Please review the following plan we discussed and let me know if I can assist you in the future.   Screening recommendations/referrals: Colonoscopy aged out Recommended yearly ophthalmology/optometry visit for glaucoma screening and checkup Recommended yearly dental visit for hygiene and checkup  Vaccinations: Influenza vaccine recommended at this time.  Pneumococcal vaccine up to date Tdap vaccine up to date Shingles vaccine recommend to get shingrix at the local pharmacy   COVID booster- recommended to get at your local pharmacy   Advanced directives: on file   Conditions/risks identified: progressive memory loss, advanced age.   Next appointment: yearly for AWV  Preventive Care 35 Years and Older, Male Preventive care refers to lifestyle choices and visits with your health care provider that can promote health and wellness. What does preventive care include? A yearly physical exam. This is also called an annual well check. Dental exams once or twice a year. Routine eye exams. Ask your health care provider how often you should have your eyes checked. Personal lifestyle choices, including: Daily care of your teeth and gums. Regular physical activity. Eating a healthy diet. Avoiding tobacco and drug use. Limiting alcohol use. Practicing safe sex. Taking low doses of aspirin every day. Taking vitamin and mineral supplements as recommended by your health care provider. What happens during an annual well check? The services and screenings done by your health care provider during your annual well check will depend on your age, overall health, lifestyle risk factors, and family history of disease. Counseling  Your health care provider may ask you questions about your: Alcohol use. Tobacco use. Drug use. Emotional well-being. Home and  relationship well-being. Sexual activity. Eating habits. History of falls. Memory and ability to understand (cognition). Work and work Statistician. Screening  You may have the following tests or measurements: Height, weight, and BMI. Blood pressure. Lipid and cholesterol levels. These may be checked every 5 years, or more frequently if you are over 59 years old. Skin check. Lung cancer screening. You may have this screening every year starting at age 73 if you have a 30-pack-year history of smoking and currently smoke or have quit within the past 15 years. Fecal occult blood test (FOBT) of the stool. You may have this test every year starting at age 1. Flexible sigmoidoscopy or colonoscopy. You may have a sigmoidoscopy every 5 years or a colonoscopy every 10 years starting at age 62. Prostate cancer screening. Recommendations will vary depending on your family history and other risks. Hepatitis C blood test. Hepatitis B blood test. Sexually transmitted disease (STD) testing. Diabetes screening. This is done by checking your blood sugar (glucose) after you have not eaten for a while (fasting). You may have this done every 1-3 years. Abdominal aortic aneurysm (AAA) screening. You may need this if you are a current or former smoker. Osteoporosis. You may be screened starting at age 73 if you are at high risk. Talk with your health care provider about your test results, treatment options, and if necessary, the need for more tests. Vaccines  Your health care provider may recommend certain vaccines, such as: Influenza vaccine. This is recommended every year. Tetanus, diphtheria, and acellular pertussis (Tdap, Td) vaccine. You may need a Td booster every 10 years. Zoster vaccine. You may need this after age 35. Pneumococcal 13-valent conjugate (PCV13) vaccine. One dose is recommended after age 80.  Pneumococcal polysaccharide (PPSV23) vaccine. One dose is recommended after age 4. Talk to your  health care provider about which screenings and vaccines you need and how often you need them. This information is not intended to replace advice given to you by your health care provider. Make sure you discuss any questions you have with your health care provider. Document Released: 05/05/2015 Document Revised: 12/27/2015 Document Reviewed: 02/07/2015 Elsevier Interactive Patient Education  2017 [place] Prevention in the Home Falls can cause injuries. They can happen to people of all ages. There are many things you can do to make your home safe and to help prevent falls. What can I do on the outside of my home? Regularly fix the edges of walkways and driveways and fix any cracks. Remove anything that might make you trip as you walk through a door, such as a raised step or threshold. Trim any bushes or trees on the path to your home. Use bright outdoor lighting. Clear any walking paths of anything that might make someone trip, such as rocks or tools. Regularly check to see if handrails are loose or broken. Make sure that both sides of any steps have handrails. Any raised decks and porches should have guardrails on the edges. Have any leaves, snow, or ice cleared regularly. Use sand or salt on walking paths during winter. Clean up any spills in your garage right away. This includes oil or grease spills. What can I do in the bathroom? Use night lights. Install grab bars by the toilet and in the tub and shower. Do not use towel bars as grab bars. Use non-skid mats or decals in the tub or shower. If you need to sit down in the shower, use a plastic, non-slip stool. Keep the floor dry. Clean up any water that spills on the floor as soon as it happens. Remove soap buildup in the tub or shower regularly. Attach bath mats securely with double-sided non-slip rug tape. Do not have throw rugs and other things on the floor that can make you trip. What can I do in the bedroom? Use night  lights. Make sure that you have a light by your bed that is easy to reach. Do not use any sheets or blankets that are too big for your bed. They should not hang down onto the floor. Have a firm chair that has side arms. You can use this for support while you get dressed. Do not have throw rugs and other things on the floor that can make you trip. What can I do in the kitchen? Clean up any spills right away. Avoid walking on wet floors. Keep items that you use a lot in easy-to-reach places. If you need to reach something above you, use a strong step stool that has a grab bar. Keep electrical cords out of the way. Do not use floor polish or wax that makes floors slippery. If you must use wax, use non-skid floor wax. Do not have throw rugs and other things on the floor that can make you trip. What can I do with my stairs? Do not leave any items on the stairs. Make sure that there are handrails on both sides of the stairs and use them. Fix handrails that are broken or loose. Make sure that handrails are as long as the stairways. Check any carpeting to make sure that it is firmly attached to the stairs. Fix any carpet that is loose or worn. Avoid having throw rugs at the  top or bottom of the stairs. If you do have throw rugs, attach them to the floor with carpet tape. Make sure that you have a light switch at the top of the stairs and the bottom of the stairs. If you do not have them, ask someone to add them for you. What else can I do to help prevent falls? Wear shoes that: Do not have high heels. Have rubber bottoms. Are comfortable and fit you well. Are closed at the toe. Do not wear sandals. If you use a stepladder: Make sure that it is fully opened. Do not climb a closed stepladder. Make sure that both sides of the stepladder are locked into place. Ask someone to hold it for you, if possible. Clearly mark and make sure that you can see: Any grab bars or handrails. First and last  steps. Where the edge of each step is. Use tools that help you move around (mobility aids) if they are needed. These include: Canes. Walkers. Scooters. Crutches. Turn on the lights when you go into a dark area. Replace any light bulbs as soon as they burn out. Set up your furniture so you have a clear path. Avoid moving your furniture around. If any of your floors are uneven, fix them. If there are any pets around you, be aware of where they are. Review your medicines with your doctor. Some medicines can make you feel dizzy. This can increase your chance of falling. Ask your doctor what other things that you can do to help prevent falls. This information is not intended to replace advice given to you by your health care provider. Make sure you discuss any questions you have with your health care provider. Document Released: 02/02/2009 Document Revised: 09/14/2015 Document Reviewed: 05/13/2014 Elsevier Interactive Patient Education  2017 [employer].

## 2021-01-30 NOTE — Telephone Encounter (Signed)
Mr. ladarrian, asencio are scheduled for a virtual visit with your provider today.    Just as we do with appointments in the office, we must obtain your consent to participate.  Your consent will be active for this visit and any virtual visit you may have with one of our providers in the next 365 days.    If you have a MyChart account, I can also send a copy of this consent to you electronically.  All virtual visits are billed to your insurance company just like a traditional visit in the office.  As this is a virtual visit, video technology does not allow for your provider to perform a traditional examination.  This may limit your provider's ability to fully assess your condition.  If your provider identifies any concerns that need to be evaluated in person or the need to arrange testing such as labs, EKG, etc, we will make arrangements to do so.    Although advances in technology are sophisticated, we cannot ensure that it will always work on either your end or our end.  If the connection with a video visit is poor, we may have to switch to a telephone visit.  With either a video or telephone visit, we are not always able to ensure that we have a secure connection.   I need to obtain your verbal consent now.   Are you willing to proceed with your visit today?   JAYSHUN GALENTINE has provided verbal consent on 01/30/2021 for a virtual visit (video or telephone).   Carroll Kinds, CMA 01/30/2021  11:41 AM

## 2021-01-30 NOTE — Progress Notes (Signed)
Subjective:   Phillip Larson is a 86 y.o. male who presents for Medicare Annual/Subsequent preventive examination.  Review of Systems     Cardiac Risk Factors include: advanced age (>62men, >71 women);hypertension;dyslipidemia;male gender     Objective:    There were no vitals filed for this visit. There is no height or weight on file to calculate BMI.  Advanced Directives 01/30/2021 11/24/2020 05/26/2020 12/10/2019 12/02/2019 06/18/2019 02/03/2019  Does Patient Have a Medical Advance Directive? Yes Yes Yes Yes Yes Yes No  Type of Advance Directive Out of facility DNR (pink MOST or yellow form) Out of facility DNR (pink MOST or yellow form) Out of facility DNR (pink MOST or yellow form) [place];Living will;Out of facility DNR (pink MOST or yellow form) Out of facility DNR (pink MOST or yellow form);[place];Living will [place];Living will -  Does patient want to make changes to medical advance directive? No - Patient declined No - Patient declined No - Patient declined No - Patient declined No - Patient declined No - Patient declined -  Copy of [place] in Chart? - - - Yes - validated most recent copy scanned in chart (See row information) No - copy requested Yes - validated most recent copy scanned in chart (See row information) -  Would patient like information on creating a medical advance directive? - - - - - - No - Patient declined  Pre-existing out of facility DNR order (yellow form or pink MOST form) Pink MOST form placed in chart (order not valid for inpatient use);Yellow form placed in chart (order not valid for inpatient use) Yellow form placed in chart (order not valid for inpatient use);Pink MOST form placed in chart (order not valid for inpatient use) Yellow form placed in chart (order not valid for inpatient use);Pink MOST form placed in chart (order not valid for inpatient use) Yellow form placed in chart  (order not valid for inpatient use);Pink MOST form placed in chart (order not valid for inpatient use) Yellow form placed in chart (order not valid for inpatient use);Pink MOST form placed in chart (order not valid for inpatient use) - -    Current Medications (verified) Outpatient Encounter Medications as of 01/30/2021  Medication Sig   aspirin EC 81 MG tablet Take 81 mg by mouth at bedtime.   b complex vitamins tablet Take 1 tablet by mouth daily.   cetirizine (ZYRTEC) 10 MG tablet Take 1 tablet (10 mg total) by mouth daily.   cholecalciferol (VITAMIN D) 1000 UNITS tablet Take 1,000 Units by mouth daily.   diltiazem (CARDIZEM CD) 240 MG 24 hr capsule Take 1 capsule (240 mg total) by mouth daily.   galantamine (RAZADYNE ER) 24 MG 24 hr capsule Take one capsule by mouth once daily with breakfast   isosorbide mononitrate (IMDUR) 30 MG 24 hr tablet Take 1 tablet (30 mg total) by mouth daily.   levothyroxine (SYNTHROID) 88 MCG tablet TAKE 1 TABLET BY MOUTH IN THE MORNING 30 MINUTES BEFORE BREAKFAST ON AN EMPTY STOMACH   memantine (NAMENDA) 10 MG tablet Take 1 tablet (10 mg total) by mouth 2 (two) times daily.   mirabegron ER (MYRBETRIQ) 50 MG TB24 tablet Take 50 mg by mouth daily.   Multiple Vitamin (MULTIVITAMIN WITH MINERALS) TABS tablet Take 1 tablet by mouth daily.   nitroGLYCERIN (NITROSTAT) 0.4 MG SL tablet One under the tongue if needed for chest tightness. Repeat in 5 minutes if needed.  Repeat again in 5 minutes if needed.   rosuvastatin (CRESTOR) 5 MG tablet Take 1 tablet (5 mg total) by mouth daily. (Patient taking differently: Take 5 mg by mouth 3 (three) times a week.)   sertraline (ZOLOFT) 50 MG tablet Take 1 tablet by mouth once daily   tamsulosin (FLOMAX) 0.4 MG CAPS capsule TAKE ONE CAPSULE BY MOUTH ONCE DAILY TO HELP RELIEVE OBSTRUCTION   traZODone (DESYREL) 100 MG tablet TAKE 1 TABLET BY MOUTH AT BEDTIME   vitamin C (ASCORBIC ACID) 500 MG tablet Take 500 mg by mouth daily.    zinc gluconate 50 MG tablet Take 50 mg by mouth at bedtime.   zolpidem (AMBIEN) 5 MG tablet TAKE 1 TABLET BY MOUTH ONCE DAILY AT BEDTIME   [DISCONTINUED] diazepam (VALIUM) 2 MG tablet Take 2 mg by mouth every 6 (six) hours as needed for anxiety.   No facility-administered encounter medications on file as of 01/30/2021.    Allergies (verified) Iodine and Iohexol   History: Past Medical History:  Diagnosis Date   Actinic keratosis    Alzheimer's disease (HCC)    Anal fissure    Anxiety state, unspecified    Benign neoplasm of colon    BENIGN PROSTATIC HYPERTROPHY, HX OF    BPH (benign prostatic hypertrophy) with urinary obstruction 06/22/2008   Qualifier: Diagnosis of  By: Johnsie Cancel, MD, Rona Ravens    Cerebrovascular disease, unspecified    Coronary atherosclerosis of native coronary artery    Depressive disorder, not elsewhere classified    Diverticulosis of colon (without mention of hemorrhage)    Essential hypertension, benign    External hemorrhoids without mention of complication    Insomnia, unspecified    Mixed hyperlipidemia    Osteoarthrosis, unspecified whether generalized or localized, unspecified site    Other premature beats    Other psoriasis    Pain in joint, lower leg    Pain in joint, site unspecified    Palpitations    Pressure ulcer    Reflux esophagitis    Unspecified essential hypertension    Unspecified hereditary and idiopathic peripheral neuropathy    Unspecified hypothyroidism    Unspecified vitamin D deficiency    Past Surgical History:  Procedure Laterality Date   CERVICAL DISC SURGERY     EYE SURGERY Left 02/07/2014   Cataract Surgery Left Eye   KNEE SURGERY  07/18/2006   Dr French Ana    LAMINECTOMY C3 disc  1972   LEFT HEART CATHETERIZATION WITH CORONARY ANGIOGRAM N/A 01/28/2013   Procedure: LEFT HEART CATHETERIZATION WITH CORONARY ANGIOGRAM;  Surgeon: Josue Hector, MD;  Location[facility] CATH LAB;  Service: Cardiovascular;  Laterality: N/A;    MULTIPLE TOOTH EXTRACTIONS  2014   RETINAL TEAR REPAIR CRYOTHERAPY     Dr Wayne Sever    Family History  Problem Relation Age of Onset   Cancer Father        pancreatic   Cancer Mother        pancreatic   Social History   Socioeconomic History   Marital status: Married    Spouse name: Not on file   Number of children: Not on file   Years of education: Not on file   Highest education level: Not on file  Occupational History   Occupation: retired  Tobacco Use   Smoking status: Former    Packs/day: 2.00    Years: 25.00    Pack years: 50.00    Types: Cigarettes    Quit date: 04/22/1992  Years since quitting: 28.7   Smokeless tobacco: Never  Vaping Use   Vaping Use: Never used  Substance and Sexual Activity   Alcohol use: Yes    Comment: beer   Drug use: No   Sexual activity: Not Currently  Other Topics Concern   Not on file  Social History Narrative   Not on file   Social Determinants of Health   Financial Resource Strain: Not on file  Food Insecurity: Not on file  Transportation Needs: Not on file  Physical Activity: Not on file  Stress: Not on file  Social Connections: Not on file    Tobacco Counseling Counseling given: Not Answered   Clinical Intake:  Pre-visit preparation completed: Yes  Pain : No/denies pain     BMI - recorded: 23.6 Nutritional Status: BMI of 19-24  Normal Diabetes: No  How often do you need to have someone help you when you read instructions, pamphlets, or other written materials from your doctor or pharmacy?: 2 - Rarely  Diabetic?no         Activities of Daily Living In your present state of health, do you have any difficulty performing the following activities: 01/30/2021  Hearing? N  Vision? N  Difficulty concentrating or making decisions? Y  Walking or climbing stairs? N  Dressing or bathing? N  Doing errands, shopping? N  Preparing Food and eating ? N  Using the Toilet? N  In the past six months, have you  accidently leaked urine? N  Do you have problems with loss of bowel control? N  Managing your Medications? Y  Managing your Finances? Y  Housekeeping or managing your Housekeeping? N  Some recent data might be hidden    Patient Care Team: Lauree Chandler, NP as PCP - General (Geriatric Medicine) Josue Hector, MD as PCP - Cardiology (Cardiology) Clent Jacks, MD as Consulting Physician (Ophthalmology) Lamonte Sakai Rose Fillers, MD as Consulting Physician (Pulmonary Disease) Angelia Mould, MD as Consulting Physician (Vascular Surgery) Earlie Server, MD as Consulting Physician (Orthopedic Surgery) Wilford Corner, MD as Consulting Physician (Gastroenterology) Chesley Mires, MD as Consulting Physician (Pulmonary Disease)  Indicate any recent Medical Services you may have received from other than [facility] providers in the past year (date may be approximate).     Assessment:   This is a routine wellness examination for Michall.  Hearing/Vision screen Hearing Screening - Comments:: Patient has no hearing problems Vision Screening - Comments:: Patient wears reading glasses. Patient has eye exam coming up. Patient sees Dr.Groat  Dietary issues and exercise activities discussed: Current Exercise Habits: Home exercise routine, Type of exercise: walking, Time (Minutes): 20, Frequency (Times/Week): 3, Weekly Exercise (Minutes/Week): 60   Goals Addressed   None    Depression Screen PHQ 2/9 Scores 01/30/2021 11/24/2020 12/02/2019 06/18/2019 12/09/2017 06/06/2017 06/04/2017  PHQ - 2 Score 0 0 0 0 6 0 0  PHQ- 9 Score - - - - 12 - -    Fall Risk Fall Risk  01/30/2021 11/24/2020 01/21/2020 12/10/2019 12/02/2019  Falls in the past year? 0 0 0 0 0  Number falls in past yr: 0 0 0 0 0  Comment - - - - -  Injury with Fall? 0 0 0 0 0  Comment - - - - -  Risk for fall due to : No Fall Risks No Fall Risks - - -  Follow up Falls evaluation completed Falls evaluation completed - - -    FALL RISK  PREVENTION PERTAINING TO THE  HOME:  Any stairs in or around the home? Yes  If so, are there any without handrails? No  Home free of loose throw rugs in walkways, pet beds, electrical cords, etc? Yes  Adequate lighting in your home to reduce risk of falls? Yes   ASSISTIVE DEVICES UTILIZED TO PREVENT FALLS:  Life alert? No  Use of a cane, walker or w/c? No  Grab bars in the bathroom? No  Shower chair or bench in shower? No  Elevated toilet seat or a handicapped toilet? No   TIMED UP AND GO:  Was the test performed? No .    Cognitive Function: MMSE - Mini Mental State Exam 06/11/2018 06/04/2017 05/21/2016 11/29/2015  Orientation to time 4 5 3 5   Orientation to Place 5 5 5 5   Registration 3 3 3 3   Attention/ Calculation 5 5 4 5   Recall 1 0 2 3  Language- name 2 objects 2 2 2 2   Language- repeat 1 1 1 1   Language- follow 3 step command 3 3 3 3   Language- read & follow direction 1 1 1 1   Write a sentence 1 1 1 1   Copy design 0 1 1 1   Total score 26 27 26 30      6CIT Screen 01/30/2021 12/02/2019  What Year? 0 points 0 points  What month? 0 points 0 points  What time? 0 points 0 points  Count back from 20 0 points 0 points  Months in reverse 0 points 0 points  Repeat phrase 8 points 8 points  Total Score 8 8    Immunizations Immunization History  Administered Date(s) Administered   Fluad Quad(high Dose 65+) 12/10/2018, 01/21/2020   Influenza, High Dose Seasonal PF 06/11/2018   Influenza,inj,Quad PF,6+ Mos 03/03/2013, 05/24/2015, 05/21/2016   PFIZER(Purple Top)SARS-COV-2 Vaccination 05/18/2019, 06/08/2019, 03/04/2020   Pneumococcal Conjugate-13 05/21/2016   Pneumococcal Polysaccharide-23 11/14/1998, 12/10/2019   Td 11/14/1998, 12/09/2017   Tdap 06/18/2011   Zoster, Live 05/27/2007    TDAP status: Up to date  Flu Vaccine status: Due, Education has been provided regarding the importance of this vaccine. Advised may receive this vaccine at local pharmacy or Health Dept.  Aware to provide a copy of the vaccination record if obtained from local pharmacy or Health Dept. Verbalized acceptance and understanding.  Pneumococcal vaccine status: Up to date  Covid-19 vaccine status: Information provided on how to obtain vaccines.   Qualifies for Shingles Vaccine? Yes   Zostavax completed No   Shingrix Completed?: No.    Education has been provided regarding the importance of this vaccine. Patient has been advised to call insurance company to determine out of pocket expense if they have not yet received this vaccine. Advised may also receive vaccine at local pharmacy or Health Dept. Verbalized acceptance and understanding.  Screening Tests Health Maintenance  Topic Date Due   Zoster Vaccines- Shingrix (1 of 2) Never done   COVID-19 Vaccine (4 - Booster for Pfizer series) 07/02/2020   INFLUENZA VACCINE  11/20/2020   TETANUS/TDAP  12/10/2027   HPV VACCINES  Aged Out    Health Maintenance  Health Maintenance Due  Topic Date Due   Zoster Vaccines- Shingrix (1 of 2) Never done   COVID-19 Vaccine (4 - Booster for Pfizer series) 07/02/2020   INFLUENZA VACCINE  11/20/2020    Colorectal cancer screening: No longer required.   Lung Cancer Screening: (Low Dose CT Chest recommended if Age 23-80 years, 30 pack-year currently smoking OR have quit w/in 15years.) does not  qualify.   Lung Cancer Screening Referral: na  Additional Screening:  Hepatitis C Screening: does not qualif  Vision Screening: Recommended annual ophthalmology exams for early detection of glaucoma and other disorders of the eye. Is the patient up to date with their annual eye exam?  Yes  Who is the provider or what is the name of the office in which the patient attends annual eye exams? Dr Katy Fitch If pt is not established with a provider, would they like to be referred to a provider to establish care? No .   Dental Screening: Recommended annual dental exams for proper oral hygiene  Community  Resource Referral / Chronic Care Management: CRR required this visit?  No   CCM required this visit?  No      Plan:     I have personally reviewed and noted the following in the patient's chart:   Medical and social history Use of alcohol, tobacco or illicit drugs  Current medications and supplements including opioid prescriptions. Patient is not currently taking opioid prescriptions. Functional ability and status Nutritional status Physical activity Advanced directives List of other physicians Hospitalizations, surgeries, and ER visits in previous 12 months Vitals Screenings to include cognitive, depression, and falls Referrals and appointments  In addition, I have reviewed and discussed with patient certain preventive protocols, quality metrics, and best practice recommendations. A written personalized care plan for preventive services as well as general preventive health recommendations were provided to patient.     Lauree Chandler, NP   01/30/2021   Virtual Visit via Telephone Note  I connected withNAME@ on 01/30/21 at 11:30 AM EDT by telephone and verified that I am speaking with the correct person using two identifiers.  Location: Patient: home Provider: [facility]   I discussed the limitations, risks, security and privacy concerns of performing an evaluation and management service by telephone and the availability of in person appointments. I also discussed with the patient that there may be a patient responsible charge related to this service. The patient expressed understanding and agreed to proceed.   I discussed the assessment and treatment plan with the patient. The patient was provided an opportunity to ask questions and all were answered. The patient agreed with the plan and demonstrated an understanding of the instructions.   The patient was advised to call back or seek an in-person evaluation if the symptoms worsen or if the condition fails to improve as  anticipated.  I provided 15 minutes of non-face-to-face time during this encounter.  Carlos American. Harle Battiest Avs printed and mailed

## 2021-01-30 NOTE — Progress Notes (Signed)
This service is provided via telemedicine  No vital signs collected/recorded due to the encounter was a telemedicine visit.   Location of patient (ex: home, work):  Home  Patient consents to a telephone visit:  Yes, see encounter dated 01/30/2021  Location of the provider (ex: office, home):  [place]  Name of any referring provider:  N/A  Names of all persons participating in the telemedicine service and their role in the encounter:  Sherrie Mustache, Nurse Practitioner, Carroll Kinds, CMA, and patient.   Time spent on call:  13 minutes with medical assistant

## 2021-02-14 DIAGNOSIS — L218 Other seborrheic dermatitis: Secondary | ICD-10-CM | POA: Diagnosis not present

## 2021-02-14 DIAGNOSIS — L570 Actinic keratosis: Secondary | ICD-10-CM | POA: Diagnosis not present

## 2021-02-15 ENCOUNTER — Other Ambulatory Visit: Payer: Self-pay | Admitting: Nurse Practitioner

## 2021-02-15 DIAGNOSIS — Z961 Presence of intraocular lens: Secondary | ICD-10-CM | POA: Diagnosis not present

## 2021-02-15 DIAGNOSIS — H04123 Dry eye syndrome of bilateral lacrimal glands: Secondary | ICD-10-CM | POA: Diagnosis not present

## 2021-02-15 DIAGNOSIS — H43813 Vitreous degeneration, bilateral: Secondary | ICD-10-CM | POA: Diagnosis not present

## 2021-02-15 DIAGNOSIS — H40013 Open angle with borderline findings, low risk, bilateral: Secondary | ICD-10-CM | POA: Diagnosis not present

## 2021-02-15 NOTE — Telephone Encounter (Signed)
Patient wife, Vickii Chafe, called and stated that patient needs a refill on his medication.   Stated that they are leaving to go Out of [employer].   Epic LR: 01/18/2021 Contract on File.  Pended Rx and sent to [facility] for approval.

## 2021-03-20 ENCOUNTER — Other Ambulatory Visit: Payer: Self-pay | Admitting: Nurse Practitioner

## 2021-03-20 NOTE — Telephone Encounter (Signed)
RX last refilled on 02/15/2021. Treatment agreement on file from Feb 2022

## 2021-03-23 ENCOUNTER — Other Ambulatory Visit: Payer: Self-pay | Admitting: Cardiovascular Disease

## 2021-04-04 ENCOUNTER — Ambulatory Visit: Payer: Medicare Other | Admitting: Adult Health

## 2021-04-04 ENCOUNTER — Other Ambulatory Visit: Payer: Self-pay

## 2021-04-04 ENCOUNTER — Encounter: Payer: Self-pay | Admitting: Adult Health

## 2021-04-04 VITALS — BP 118/72 | HR 81 | Temp 97.9°F | Ht 71.0 in | Wt 166.0 lb

## 2021-04-04 DIAGNOSIS — R112 Nausea with vomiting, unspecified: Secondary | ICD-10-CM

## 2021-04-04 MED ORDER — ONDANSETRON 4 MG PO TBDP: 4.0000 mg | ORAL_TABLET | Freq: Three times a day (TID) | ORAL | 1 refills | Status: AC

## 2021-04-04 NOTE — Progress Notes (Signed)
Location:  [facility]   Place of Service:   clinic    CODE STATUS: dnr  Allergies  Allergen Reactions   Iodine Swelling    Internal iodine   Iohexol Hives         Chief Complaint  Patient presents with   Acute Visit    Stomach discomfort and vomiting (last episode yesterday).  Not eating x couple days, only able to hold down small quantities of liquid. Patient is stressed due to wife in hospital x 2 weeks     HPI:  His wife in the hospital in [place]. He is now alone during the daytime. He does have family at night. He does have some anxiety by being alone during the day. His appetite is very poor; he has vomited three times; stomach contents. No diarrhea. No fevers. No changes in bowel or bladder habits.   Past Medical History:  Diagnosis Date   Actinic keratosis    Alzheimer's disease ([place])    Anal fissure    Anxiety state, unspecified    Benign neoplasm of colon    BENIGN PROSTATIC HYPERTROPHY, HX OF    BPH (benign prostatic hypertrophy) with urinary obstruction 06/22/2008   Qualifier: Diagnosis of  By: Johnsie Cancel, MD, Rona Ravens    Cerebrovascular disease, unspecified    Coronary atherosclerosis of native coronary artery    Depressive disorder, not elsewhere classified    Diverticulosis of colon (without mention of hemorrhage)    Essential hypertension, benign    External hemorrhoids without mention of complication    Insomnia, unspecified    Mixed hyperlipidemia    Osteoarthrosis, unspecified whether generalized or localized, unspecified site    Other premature beats    Other psoriasis    Pain in joint, lower leg    Pain in joint, site unspecified    Palpitations    Pressure ulcer    Reflux esophagitis    Unspecified essential hypertension    Unspecified hereditary and idiopathic peripheral neuropathy    Unspecified hypothyroidism    Unspecified vitamin D deficiency     Past Surgical History:  Procedure Laterality Date   CERVICAL  DISC SURGERY     EYE SURGERY Left 02/07/2014   Cataract Surgery Left Eye   KNEE SURGERY  07/18/2006   Dr French Ana    LAMINECTOMY C3 disc  1972   LEFT HEART CATHETERIZATION WITH CORONARY ANGIOGRAM N/A 01/28/2013   Procedure: LEFT HEART CATHETERIZATION WITH CORONARY ANGIOGRAM;  Surgeon: Josue Hector, MD;  Location: [facility] CATH LAB;  Service: Cardiovascular;  Laterality: N/A;   MULTIPLE TOOTH EXTRACTIONS  2014   RETINAL TEAR REPAIR CRYOTHERAPY     Dr Wayne Sever     Social History   Socioeconomic History   Marital status: Married    Spouse name: Not on file   Number of children: Not on file   Years of education: Not on file   Highest education level: Not on file  Occupational History   Occupation: retired  Tobacco Use   Smoking status: Former    Packs/day: 2.00    Years: 25.00    Pack years: 50.00    Types: Cigarettes    Quit date: 04/22/1992    Years since quitting: 28.9   Smokeless tobacco: Never  Vaping Use   Vaping Use: Never used  Substance and Sexual Activity   Alcohol use: Yes    Comment: beer   Drug use: No   Sexual activity: Not Currently  Other Topics  Concern   Not on file  Social History Narrative   Not on file   Social Determinants of Health   Financial Resource Strain: Not on file  Food Insecurity: Not on file  Transportation Needs: Not on file  Physical Activity: Not on file  Stress: Not on file  Social Connections: Not on file  Intimate Partner Violence: Not on file   Family History  Problem Relation Age of Onset   Cancer Father        pancreatic   Cancer Mother        pancreatic      VITAL SIGNS BP 118/72 (BP Location: Right Arm, Patient Position: Sitting, Cuff Size: Normal)    Pulse 81    Temp 97.9 F (36.6 C) (Temporal)    Ht 5\' 11"  (1.803 m)    Wt 166 lb (75.3 kg)    SpO2 97%    BMI 23.15 kg/m   Outpatient Encounter Medications as of 04/04/2021  Medication Sig   aspirin EC 81 MG tablet Take 81 mg by mouth at bedtime.   b complex vitamins  tablet Take 1 tablet by mouth daily.   cetirizine (ZYRTEC) 10 MG tablet Take 1 tablet (10 mg total) by mouth daily.   cholecalciferol (VITAMIN D) 1000 UNITS tablet Take 1,000 Units by mouth daily.   diltiazem (CARDIZEM CD) 240 MG 24 hr capsule Take 1 capsule (240 mg total) by mouth daily.   galantamine (RAZADYNE ER) 24 MG 24 hr capsule Take one capsule by mouth once daily with breakfast   isosorbide mononitrate (IMDUR) 30 MG 24 hr tablet Take 1 tablet by mouth once daily   levothyroxine (SYNTHROID) 88 MCG tablet TAKE 1 TABLET BY MOUTH IN THE MORNING 30 MINUTES BEFORE BREAKFAST ON AN EMPTY STOMACH   memantine (NAMENDA) 10 MG tablet Take 1 tablet (10 mg total) by mouth 2 (two) times daily.   mirabegron ER (MYRBETRIQ) 50 MG TB24 tablet Take 50 mg by mouth daily.   Multiple Vitamin (MULTIVITAMIN WITH MINERALS) TABS tablet Take 1 tablet by mouth daily.   nitroGLYCERIN (NITROSTAT) 0.4 MG SL tablet One under the tongue if needed for chest tightness. Repeat in 5 minutes if needed. Repeat again in 5 minutes if needed.   rosuvastatin (CRESTOR) 5 MG tablet Take 1 tablet (5 mg total) by mouth daily.   sertraline (ZOLOFT) 50 MG tablet Take 1 tablet by mouth once daily   tamsulosin (FLOMAX) 0.4 MG CAPS capsule TAKE ONE CAPSULE BY MOUTH ONCE DAILY TO HELP RELIEVE OBSTRUCTION   traZODone (DESYREL) 100 MG tablet TAKE 1 TABLET BY MOUTH AT BEDTIME   vitamin C (ASCORBIC ACID) 500 MG tablet Take 500 mg by mouth daily.   zinc gluconate 50 MG tablet Take 50 mg by mouth at bedtime.   zolpidem (AMBIEN) 5 MG tablet TAKE 1 TABLET BY MOUTH ONCE DAILY AT BEDTIME   No facility-administered encounter medications on file as of 04/04/2021.     SIGNIFICANT DIAGNOSTIC EXAMS  Review of Systems  Constitutional:  Positive for malaise/fatigue.  Respiratory:  Negative for cough and shortness of breath.   Cardiovascular:  Negative for chest pain, palpitations and leg swelling.  Gastrointestinal:  Positive for nausea and  vomiting. Negative for abdominal pain, blood in stool, constipation, diarrhea and heartburn.       Poor appetite   Musculoskeletal:  Negative for back pain, joint pain and myalgias.  Skin: Negative.   Neurological:  Negative for dizziness.  Psychiatric/Behavioral:  The patient is not nervous/anxious.  Physical Exam Constitutional:      General: He is not in acute distress.    Appearance: He is well-developed. He is not diaphoretic.  Neck:     Thyroid: No thyromegaly.  Cardiovascular:     Rate and Rhythm: Normal rate and regular rhythm.     Pulses: Normal pulses.     Heart sounds: Normal heart sounds.  Pulmonary:     Effort: Pulmonary effort is normal. No respiratory distress.     Breath sounds: Normal breath sounds.  Abdominal:     General: Bowel sounds are normal. There is no distension.     Palpations: Abdomen is soft.     Tenderness: There is no abdominal tenderness. There is no right CVA tenderness, left CVA tenderness or guarding.  Musculoskeletal:        General: Normal range of motion.     Cervical back: Neck supple.  Lymphadenopathy:     Cervical: No cervical adenopathy.  Skin:    General: Skin is warm and dry.  Neurological:     Mental Status: He is alert and oriented to person, place, and time.  Psychiatric:        Mood and Affect: Mood normal.      ASSESSMENT/ PLAN:  Nausea and vomiting.   Will begin zofram 4 mg otd prior to meals Instructed on how to start eating/drinking fluids/no alcohol Family present verbalized understanding on when to call clinic for further follow up    Ok Edwards NP [facility]  Contact [phone] Monday through Friday 8am- 5pm  After hours call [phone]

## 2021-04-09 ENCOUNTER — Other Ambulatory Visit: Payer: Self-pay

## 2021-04-09 ENCOUNTER — Encounter: Payer: Self-pay | Admitting: Nurse Practitioner

## 2021-04-09 ENCOUNTER — Ambulatory Visit (INDEPENDENT_AMBULATORY_CARE_PROVIDER_SITE_OTHER): Payer: Medicare Other | Admitting: Nurse Practitioner

## 2021-04-09 ENCOUNTER — Telehealth: Payer: Self-pay

## 2021-04-09 VITALS — BP 142/78 | HR 64 | Temp 97.3°F | Ht 71.0 in | Wt 168.0 lb

## 2021-04-09 DIAGNOSIS — G301 Alzheimer's disease with late onset: Secondary | ICD-10-CM | POA: Diagnosis not present

## 2021-04-09 DIAGNOSIS — R7989 Other specified abnormal findings of blood chemistry: Secondary | ICD-10-CM | POA: Diagnosis not present

## 2021-04-09 DIAGNOSIS — I10 Essential (primary) hypertension: Secondary | ICD-10-CM

## 2021-04-09 DIAGNOSIS — F5104 Psychophysiologic insomnia: Secondary | ICD-10-CM

## 2021-04-09 DIAGNOSIS — F0280 Dementia in other diseases classified elsewhere without behavioral disturbance: Secondary | ICD-10-CM

## 2021-04-09 DIAGNOSIS — E782 Mixed hyperlipidemia: Secondary | ICD-10-CM

## 2021-04-09 DIAGNOSIS — R972 Elevated prostate specific antigen [PSA]: Secondary | ICD-10-CM

## 2021-04-09 DIAGNOSIS — N400 Enlarged prostate without lower urinary tract symptoms: Secondary | ICD-10-CM

## 2021-04-09 MED ORDER — TRAZODONE HCL 100 MG PO TABS: 100.0000 mg | ORAL_TABLET | Freq: Every day | ORAL | 3 refills | Status: AC

## 2021-04-09 MED ORDER — MEMANTINE HCL 10 MG PO TABS: 10.0000 mg | ORAL_TABLET | Freq: Two times a day (BID) | ORAL | 3 refills | Status: AC

## 2021-04-09 NOTE — Progress Notes (Signed)
° ° °Careteam: °Patient Care Team: °,  K, NP as PCP - General (Geriatric Medicine) °Nishan, Peter C, MD as PCP - Cardiology (Cardiology) °Groat, Robert, MD as Consulting Physician (Ophthalmology) °Byrum, Robert S, MD as Consulting Physician (Pulmonary Disease) °Dickson, Christopher S, MD as Consulting Physician (Vascular Surgery) °Caffrey, Daniel, MD as Consulting Physician (Orthopedic Surgery) °Schooler, Vincent, MD as Consulting Physician (Gastroenterology) °Sood, Vineet, MD as Consulting Physician (Pulmonary Disease) ° °PLACE OF SERVICE:  °[facility]  °Advanced Directive information °Does Patient Have a Medical Advance Directive?: Yes, Type of Advance Directive: Out of facility DNR (pink MOST or yellow form), Pre-existing out of facility DNR order (yellow form or pink MOST form): Yellow form placed in chart (order not valid for inpatient use);Pink MOST form placed in chart (order not valid for inpatient use), Does patient want to make changes to medical advance directive?: No - Patient declined ° °Allergies  °Allergen Reactions  ° Iodine Swelling  °  Internal iodine  ° Iohexol Hives  °   °  ° ° °Chief Complaint  °Patient presents with  ° Medical Management of Chronic Issues  °  3 month follow-up. Right should injury 3-4 weeks ago. Discuss gap in medication refills such as flomax, namenda, ect. Refill any medications if needed, patients wife manages medications and she is currently hospitalized. Patients daughter can assist if needed (lives in [place])   ° ° ° °HPI: Patient is a 86 y.o. male for routine follow up.  ° °Reports wife has been hospitalized for 2-3 weeks in [place] due to cancer of her blood. She is generally responsible for his medication and overall takes care of him. He reports his step-children are helping take care of him and helping with his medication (pill box).  ° °BPH- followed by urologist who prescribed flomax. ° °Reports he was drinking some Gin- but since he has had  stomach problems he has stopped.  °Stomach has gotten better and now he is eating better.  °He was not taking care of himself but trying to do better.  ° ° °Review of Systems:  °Review of Systems  °Constitutional:  Negative for chills, fever and weight loss.  °HENT:  Negative for tinnitus.   °Respiratory:  Negative for cough, sputum production and shortness of breath.   °Cardiovascular:  Negative for chest pain, palpitations and leg swelling.  °Gastrointestinal:  Negative for abdominal pain, constipation, diarrhea and heartburn.  °Genitourinary:  Negative for dysuria, frequency and urgency.  °Musculoskeletal:  Negative for back pain, falls, joint pain and myalgias.  °Skin: Negative.   °Neurological:  Negative for dizziness and headaches.  °Psychiatric/Behavioral:  Positive for memory loss. Negative for depression. The patient is not nervous/anxious and does not have insomnia.   ° °Past Medical History:  °Diagnosis Date  ° Actinic keratosis   ° Alzheimer's disease (H[facility])   ° Anal fissure   ° Anxiety state, unspecified   ° Benign neoplasm of colon   ° BENIGN PROSTATIC HYPERTROPHY, HX OF   ° BPH (benign prostatic hypertrophy) with urinary obstruction 06/22/2008  ° Qualifier: Diagnosis of  By: Nishan, MD, FACC, Peter Charles   ° Cerebrovascular disease, unspecified   ° Coronary atherosclerosis of native coronary artery   ° Depressive disorder, not elsewhere classified   ° Diverticulosis of colon (without mention of hemorrhage)   ° Essential hypertension, benign   ° External hemorrhoids without mention of complication   ° Insomnia, unspecified   ° Mixed hyperlipidemia   ° Osteoarthrosis, unspecified whether generalized   generalized or localized, unspecified site    Other premature beats    Other psoriasis    Pain in joint, lower leg    Pain in joint, site unspecified    Palpitations    Pressure ulcer    Reflux esophagitis    Unspecified essential hypertension    Unspecified hereditary and idiopathic peripheral neuropathy     Unspecified hypothyroidism    Unspecified vitamin D deficiency    Past Surgical History:  Procedure Laterality Date   CERVICAL DISC SURGERY     EYE SURGERY Left 02/07/2014   Cataract Surgery Left Eye   KNEE SURGERY  07/18/2006   Dr French Ana    LAMINECTOMY C3 disc  1972   LEFT HEART CATHETERIZATION WITH CORONARY ANGIOGRAM N/A 01/28/2013   Procedure: LEFT HEART CATHETERIZATION WITH CORONARY ANGIOGRAM;  Surgeon: Josue Hector, MD;  Location: [facility] CATH LAB;  Service: Cardiovascular;  Laterality: N/A;   MULTIPLE TOOTH EXTRACTIONS  2014   RETINAL TEAR REPAIR CRYOTHERAPY     Dr Wayne Sever    Social History:   reports that he quit smoking about 28 years ago. His smoking use included cigarettes. He has a 50.00 pack-year smoking history. He has never used smokeless tobacco. He reports current alcohol use. He reports that he does not use drugs.  Family History  Problem Relation Age of Onset   Cancer Father        pancreatic   Cancer Mother        pancreatic    Medications: Patient's Medications  New Prescriptions   No medications on file  Previous Medications   ASPIRIN EC 81 MG TABLET    Take 81 mg by mouth at bedtime.   B COMPLEX VITAMINS TABLET    Take 1 tablet by mouth daily.   CETIRIZINE (ZYRTEC) 10 MG TABLET    Take 1 tablet (10 mg total) by mouth daily.   CHOLECALCIFEROL (VITAMIN D) 1000 UNITS TABLET    Take 1,000 Units by mouth daily.   DILTIAZEM (CARDIZEM CD) 240 MG 24 HR CAPSULE    Take 1 capsule (240 mg total) by mouth daily.   GALANTAMINE (RAZADYNE ER) 24 MG 24 HR CAPSULE    Take one capsule by mouth once daily with breakfast   ISOSORBIDE MONONITRATE (IMDUR) 30 MG 24 HR TABLET    Take 1 tablet by mouth once daily   LEVOTHYROXINE (SYNTHROID) 88 MCG TABLET    TAKE 1 TABLET BY MOUTH IN THE MORNING 30 MINUTES BEFORE BREAKFAST ON AN EMPTY STOMACH   MEMANTINE (NAMENDA) 10 MG TABLET    Take 1 tablet (10 mg total) by mouth 2 (two) times daily.   MIRABEGRON ER (MYRBETRIQ) 50 MG TB24 TABLET     Take 50 mg by mouth daily.   MULTIPLE VITAMIN (MULTIVITAMIN WITH MINERALS) TABS TABLET    Take 1 tablet by mouth daily.   NITROGLYCERIN (NITROSTAT) 0.4 MG SL TABLET    One under the tongue if needed for chest tightness. Repeat in 5 minutes if needed. Repeat again in 5 minutes if needed.   ONDANSETRON (ZOFRAN-ODT) 4 MG DISINTEGRATING TABLET    Take 1 tablet (4 mg total) by mouth 3 (three) times daily with meals.   ROSUVASTATIN (CRESTOR) 5 MG TABLET    Take 1 tablet (5 mg total) by mouth daily.   SERTRALINE (ZOLOFT) 50 MG TABLET    Take 1 tablet by mouth once daily   TAMSULOSIN (FLOMAX) 0.4 MG CAPS CAPSULE    TAKE ONE CAPSULE BY  ONCE DAILY TO HELP RELIEVE OBSTRUCTION  ° TRAZODONE (DESYREL) 100 MG TABLET    TAKE 1 TABLET BY MOUTH AT BEDTIME  ° VITAMIN C (ASCORBIC ACID) 500 MG TABLET    Take 500 mg by mouth daily.  ° ZINC GLUCONATE 50 MG TABLET    Take 50 mg by mouth at bedtime.  ° ZOLPIDEM (AMBIEN) 5 MG TABLET    TAKE 1 TABLET BY MOUTH ONCE DAILY AT BEDTIME  °Modified Medications  ° No medications on file  °Discontinued Medications  ° No medications on file  ° ° °Physical Exam: ° °Vitals:  ° 04/09/21 1112  °BP: (!) 142/78  °Pulse: 64  °Temp: (!) 97.3 °F (36.3 °C)  °TempSrc: Temporal  °SpO2: 99%  °Weight: 168 lb (76.2 kg)  °Height: 5' 11" (1.803 m)  ° °Body mass index is 23.43 kg/m². °Wt Readings from Last 3 Encounters:  °04/09/21 168 lb (76.2 kg)  °04/04/21 166 lb (75.3 kg)  °11/29/20 169 lb 9.6 oz (76.9 kg)  ° ° °Physical Exam °Constitutional:   °   General: He is not in acute distress. °   Appearance: He is well-developed. He is not diaphoretic.  °HENT:  °   Head: Normocephalic and atraumatic.  °   Right Ear: External ear normal.  °   Left Ear: External ear normal.  °   Mouth/Throat:  °   Pharynx: No oropharyngeal exudate.  °Eyes:  °   Conjunctiva/sclera: Conjunctivae normal.  °   Pupils: Pupils are equal, round, and reactive to light.  °Cardiovascular:  °   Rate and Rhythm: Normal rate and regular  rhythm.  °   Heart sounds: Normal heart sounds.  °Pulmonary:  °   Effort: Pulmonary effort is normal.  °   Breath sounds: Normal breath sounds.  °Abdominal:  °   General: Bowel sounds are normal.  °   Palpations: Abdomen is soft.  °Musculoskeletal:     °   General: No tenderness.  °   Cervical back: Normal range of motion and neck supple.  °   Right lower leg: No edema.  °   Left lower leg: No edema.  °Skin: °   General: Skin is warm and dry.  °Neurological:  °   Mental Status: He is alert.  °Psychiatric:     °   Mood and Affect: Mood normal.  ° ° °Labs reviewed: °Basic Metabolic Panel: °Recent Labs  °  06/23/20 °0954 11/22/20 °0920 01/01/21 °0820  °NA 143 138 140  °K 4.0 4.4 4.1  °CL 107 101 104  °CO2 25 29 29  °GLUCOSE 77 92 83  °BUN 12 10 11  °CREATININE 1.20* 1.30* 1.08  °CALCIUM 9.1 9.5 9.6  °TSH  --  4.15  --   ° °Liver Function Tests: °Recent Labs  °  06/23/20 °0954 11/22/20 °0920 01/01/21 °0820  °AST 58* 13 18  °ALT 51* 10 13  °BILITOT 0.6 0.5 0.6  °PROT 5.7* 6.1 6.2  ° °No results for input(s): LIPASE, AMYLASE in the last 8760 hours. °No results for input(s): AMMONIA in the last 8760 hours. °CBC: °Recent Labs  °  05/23/20 °0939 11/22/20 °0920  °WBC 5.4 5.0  °NEUTROABS 3,262 2,830  °HGB 15.9 15.4  °HCT 45.4 48.1  °MCV 97.0 95.6  °PLT 214 233  ° °Lipid Panel: °Recent Labs  °  06/23/20 °0954 11/22/20 °0920 01/01/21 °0820  °CHOL 149 206* 168  °HDL 71 60 65  °LDLCALC 60 130* 83  °TRIG 99   99 68 104  CHOLHDL 2.1 3.4 2.6   TSH: Recent Labs    11/22/20 0920  TSH 4.15   A1C: No results found for: HGBA1C   Assessment/Plan 1. Chronic insomnia -stable, continue on Ambien and trazodone PRN - traZODone (DESYREL) 100 MG tablet; Take 1 tablet (100 mg total) by mouth at bedtime.  Dispense: 90 tablet; Refill: 3  2. Late onset Alzheimer's disease without behavioral disturbance ([place]) -has not been taking galantamine (last refill in our system was 2020- not on pharmacy profile) will dc off medication list.   -continues on namenda- will refill.  - memantine (NAMENDA) 10 MG tablet; Take 1 tablet (10 mg total) by mouth 2 (two) times daily.  Dispense: 180 tablet; Refill: 3  3. Mixed hyperlipidemia - continues on crestor - taking 3 times weekly - CMP with eGFR([facility])  4. Abnormal LFTs - CMP with eGFR([facility])  5. Essential hypertension, benign -controlled on cardizem  - CMP with eGFR([facility])  6. BPH with elevated PSA -continues on flomax and followed by urology.    Next appt: 4 months.  Carlos American. [place], [facility] [phone]

## 2021-04-09 NOTE — Telephone Encounter (Signed)
Patient was in office today and his wife manages his medication (she was not present due to being hospitalized).Patient was not 100 % sure of all his medications.  Per Lauree Chandler, NP I called the pharmacy to inquire about the last fill date for the galantamine and memantine.  Per the pharmacist galantamine was not on patients profile and memantine was last refilled on 03/30/21

## 2021-04-09 NOTE — Telephone Encounter (Signed)
Noted thank you

## 2021-04-10 LAB — COMPLETE METABOLIC PANEL WITH GFR
AG Ratio: 1.8 (calc) (ref 1.0–2.5)
ALT: 22 U/L (ref 9–46)
AST: 25 U/L (ref 10–35)
Albumin: 4 g/dL (ref 3.6–5.1)
Alkaline phosphatase (APISO): 94 U/L (ref 35–144)
BUN: 15 mg/dL (ref 7–25)
CO2: 28 mmol/L (ref 20–32)
Calcium: 9.2 mg/dL (ref 8.6–10.3)
Chloride: 101 mmol/L (ref 98–110)
Creat: 1.08 mg/dL (ref 0.70–1.22)
Globulin: 2.2 g/dL (calc) (ref 1.9–3.7)
Glucose, Bld: 93 mg/dL (ref 65–99)
Potassium: 4 mmol/L (ref 3.5–5.3)
Sodium: 138 mmol/L (ref 135–146)
Total Bilirubin: 0.9 mg/dL (ref 0.2–1.2)
Total Protein: 6.2 g/dL (ref 6.1–8.1)
eGFR: 68 mL/min/{1.73_m2} (ref >=60)

## 2021-04-19 ENCOUNTER — Other Ambulatory Visit: Payer: Self-pay | Admitting: Nurse Practitioner

## 2021-04-19 DIAGNOSIS — F3342 Major depressive disorder, recurrent, in full remission: Secondary | ICD-10-CM

## 2021-05-22 ENCOUNTER — Other Ambulatory Visit: Payer: Self-pay

## 2021-05-22 ENCOUNTER — Ambulatory Visit (INDEPENDENT_AMBULATORY_CARE_PROVIDER_SITE_OTHER): Payer: Medicare Other | Admitting: Family

## 2021-05-22 ENCOUNTER — Encounter: Payer: Self-pay | Admitting: Family

## 2021-05-22 VITALS — BP 138/88 | HR 82 | Temp 97.9°F | Resp 16 | Ht 71.0 in | Wt 164.0 lb

## 2021-05-22 DIAGNOSIS — R195 Other fecal abnormalities: Secondary | ICD-10-CM

## 2021-05-22 LAB — CBC WITH DIFFERENTIAL/PLATELET
Absolute Monocytes: 340 cells/uL (ref 200–950)
Basophils Absolute: 27 cells/uL (ref 0–200)
Basophils Relative: 0.4 %
Eosinophils Absolute: 109 cells/uL (ref 15–500)
Eosinophils Relative: 1.6 %
HCT: 40.6 % (ref 38.5–50.0)
Hemoglobin: 13.8 g/dL (ref 13.2–17.1)
Lymphs Abs: 1142 cells/uL (ref 850–3900)
MCH: 33.5 pg — ABNORMAL HIGH (ref 27.0–33.0)
MCHC: 34 g/dL (ref 32.0–36.0)
MCV: 98.5 fL (ref 80.0–100.0)
MPV: 10.9 fL (ref 7.5–12.5)
Monocytes Relative: 5 %
Neutro Abs: 5182 cells/uL (ref 1500–7800)
Neutrophils Relative %: 76.2 %
Platelets: 219 10*3/uL (ref 140–400)
RBC: 4.12 10*6/uL — ABNORMAL LOW (ref 4.20–5.80)
RDW: 12 % (ref 11.0–15.0)
Total Lymphocyte: 16.8 %
WBC: 6.8 10*3/uL (ref 3.8–10.8)

## 2021-05-22 NOTE — Progress Notes (Signed)
Provider: Hardie Veltre FNP-C  Lauree Chandler, NP  Patient Care Team: Lauree Chandler, NP as PCP - General (Geriatric Medicine) Josue Hector, MD as PCP - Cardiology (Cardiology) Clent Jacks, MD as Consulting Physician (Ophthalmology) Lamonte Sakai Rose Fillers, MD as Consulting Physician (Pulmonary Disease) Angelia Mould, MD as Consulting Physician (Vascular Surgery) Earlie Server, MD as Consulting Physician (Orthopedic Surgery) Wilford Corner, MD as Consulting Physician (Gastroenterology) Chesley Mires, MD as Consulting Physician (Pulmonary Disease)  Extended Emergency Contact Information Primary Emergency Contact: Dack,Peggy "Joycelyn Schmid" Address: [address]          [place] Phone: [phone] Mobile Phone: [phone] Relation: Spouse Secondary Emergency Contact: [place] Mobile Phone: [phone] Relation: Daughter Preferred language: English  Code Status:  DNR Goals of care: Advanced Directive information Advanced Directives 05/22/2021  Does Patient Have a Medical Advance Directive? Yes  Type of Advance Directive Out of facility DNR (pink MOST or yellow form)  Does patient want to make changes to medical advance directive? No - Patient declined  Copy of [place] in Chart? -  Would patient like information on creating a medical advance directive? -  Pre-existing out of facility DNR order (yellow form or pink MOST form) -     Chief Complaint  Patient presents with   Acute Visit    Patient complains of black stool x 3-4 days.     HPI:  Pt is a 87 y.o. male seen today for an acute visit for evaluation of black stool x 3-4 days. He denies any fatigue,indigestion,constipation,diarrhea,nausea or vomiting.Also denies any bright red blood in the stool. No history of hemorrhoids. States wife died few weeks ago and has had several people managing his medication thought might have taken it  incorrectly.   Past Medical History:  Diagnosis Date   Actinic keratosis    Alzheimer's disease ([place])    Anal fissure    Anxiety state, unspecified    Benign neoplasm of colon    BENIGN PROSTATIC HYPERTROPHY, HX OF    BPH (benign prostatic hypertrophy) with urinary obstruction 06/22/2008   Qualifier: Diagnosis of  By: Johnsie Cancel, MD, Rona Ravens    Cerebrovascular disease, unspecified    Coronary atherosclerosis of native coronary artery    Depressive disorder, not elsewhere classified    Diverticulosis of colon (without mention of hemorrhage)    Essential hypertension, benign    External hemorrhoids without mention of complication    Insomnia, unspecified    Mixed hyperlipidemia    Osteoarthrosis, unspecified whether generalized or localized, unspecified site    Other premature beats    Other psoriasis    Pain in joint, lower leg    Pain in joint, site unspecified    Palpitations    Pressure ulcer    Reflux esophagitis    Unspecified essential hypertension    Unspecified hereditary and idiopathic peripheral neuropathy    Unspecified hypothyroidism    Unspecified vitamin D deficiency    Past Surgical History:  Procedure Laterality Date   CERVICAL DISC SURGERY     EYE SURGERY Left 02/07/2014   Cataract Surgery Left Eye   KNEE SURGERY  07/18/2006   Dr French Ana    LAMINECTOMY C3 disc  1972   LEFT HEART CATHETERIZATION WITH CORONARY ANGIOGRAM N/A 01/28/2013   Procedure: LEFT HEART CATHETERIZATION WITH CORONARY ANGIOGRAM;  Surgeon: Josue Hector, MD;  Location: [facility] CATH LAB;  Service: Cardiovascular;  Laterality: N/A;   MULTIPLE TOOTH EXTRACTIONS  2014   RETINAL TEAR REPAIR CRYOTHERAPY     Dr Wayne Sever     Allergies  Allergen Reactions   Iodine Swelling    Internal iodine   Iohexol Hives         Outpatient Encounter Medications as of 05/22/2021  Medication Sig   aspirin EC 81 MG tablet Take 81 mg by mouth at bedtime.   b complex vitamins tablet Take 1 tablet by mouth  daily.   cetirizine (ZYRTEC) 10 MG tablet Take 1 tablet (10 mg total) by mouth daily.   cholecalciferol (VITAMIN D) 1000 UNITS tablet Take 1,000 Units by mouth daily.   diltiazem (CARDIZEM CD) 240 MG 24 hr capsule Take 1 capsule (240 mg total) by mouth daily.   isosorbide mononitrate (IMDUR) 30 MG 24 hr tablet Take 1 tablet by mouth once daily   levothyroxine (SYNTHROID) 88 MCG tablet TAKE 1 TABLET BY MOUTH IN THE MORNING 30 MINUTES BEFORE BREAKFAST ON AN EMPTY STOMACH   memantine (NAMENDA) 10 MG tablet Take 1 tablet (10 mg total) by mouth 2 (two) times daily.   mirabegron ER (MYRBETRIQ) 50 MG TB24 tablet Take 50 mg by mouth daily.   Multiple Vitamin (MULTIVITAMIN WITH MINERALS) TABS tablet Take 1 tablet by mouth daily.   nitroGLYCERIN (NITROSTAT) 0.4 MG SL tablet One under the tongue if needed for chest tightness. Repeat in 5 minutes if needed. Repeat again in 5 minutes if needed.   ondansetron (ZOFRAN-ODT) 4 MG disintegrating tablet Take 1 tablet (4 mg total) by mouth 3 (three) times daily with meals.   rosuvastatin (CRESTOR) 5 MG tablet Take 5 mg by mouth every Monday, Wednesday, and Friday.   sertraline (ZOLOFT) 50 MG tablet Take 1 tablet by mouth once daily   tamsulosin (FLOMAX) 0.4 MG CAPS capsule TAKE ONE CAPSULE BY MOUTH ONCE DAILY TO HELP RELIEVE OBSTRUCTION   traZODone (DESYREL) 100 MG tablet Take 1 tablet (100 mg total) by mouth at bedtime.   vitamin C (ASCORBIC ACID) 500 MG tablet Take 500 mg by mouth daily.   zinc gluconate 50 MG tablet Take 50 mg by mouth at bedtime.   zolpidem (AMBIEN) 5 MG tablet TAKE 1 TABLET BY MOUTH ONCE DAILY AT BEDTIME   No facility-administered encounter medications on file as of 05/22/2021.    Review of Systems  Constitutional:  Negative for appetite change, chills, fatigue, fever and unexpected weight change.  Eyes:  Negative for pain, discharge, redness, itching and visual disturbance.  Respiratory:  Negative for cough, chest tightness, shortness of  breath and wheezing.   Cardiovascular:  Negative for chest pain, palpitations and leg swelling.  Gastrointestinal:  Negative for abdominal distention, abdominal pain, blood in stool, constipation, diarrhea, nausea and vomiting.       Reports having black stool   Musculoskeletal:  Negative for arthralgias, back pain, gait problem, joint swelling, myalgias, neck pain and neck stiffness.  Neurological:  Negative for dizziness, syncope, speech difficulty, weakness, light-headedness, numbness and headaches.  Hematological:  Does not bruise/bleed easily.  Psychiatric/Behavioral:  Negative for agitation, behavioral problems, confusion and sleep disturbance. The patient is not nervous/anxious.    Immunization History  Administered Date(s) Administered   Fluad Quad(high Dose 65+) 12/10/2018, 01/21/2020   Influenza, High Dose Seasonal PF 06/11/2018   Influenza,inj,Quad PF,6+ Mos 03/03/2013, 05/24/2015, 05/21/2016   PFIZER(Purple Top)SARS-COV-2 Vaccination 05/18/2019, 06/08/2019, 03/04/2020   Pneumococcal Conjugate-13 05/21/2016   Pneumococcal Polysaccharide-23 11/14/1998, 12/10/2019   Td 11/14/1998, 12/09/2017   Tdap 06/18/2011   Zoster, Live 05/27/2007  Pertinent  Health Maintenance Due  Topic Date Due   INFLUENZA VACCINE  11/20/2020   Fall Risk 12/10/2019 01/21/2020 11/24/2020 01/30/2021 05/22/2021  Falls in the past year? 0 0 0 0 0  Number of falls in past year - - - - -  Was there an injury with Fall? 0 0 0 0 0  Was there an injury with Fall? - - - - -  Fall Risk Category Calculator 0 0 0 0 0  Fall Risk Category Low Low Low Low Low  Patient Fall Risk Level Low fall risk Low fall risk Low fall risk Low fall risk Low fall risk  Patient at Risk for Falls Due to - - No Fall Risks No Fall Risks No Fall Risks  Fall risk Follow up - - Falls evaluation completed Falls evaluation completed Falls evaluation completed   Functional Status Survey:    Vitals:   05/22/21 1309  BP: 138/88  Pulse: 82   Resp: 16  Temp: 97.9 F (36.6 C)  SpO2: 93%  Weight: 164 lb (74.4 kg)  Height: 5\' 11"  (1.803 m)   Body mass index is 22.87 kg/m. Physical Exam Vitals reviewed. Exam conducted with a chaperone present [facility]).  Constitutional:      General: He is not in acute distress.    Appearance: Normal appearance. He is normal weight. He is not ill-appearing or diaphoretic.  HENT:     Head: Normocephalic.     Mouth/Throat:     Mouth: Mucous membranes are moist.     Pharynx: Oropharynx is clear. No oropharyngeal exudate or posterior oropharyngeal erythema.  Eyes:     General: No scleral icterus.       Right eye: No discharge.        Left eye: No discharge.     Conjunctiva/sclera: Conjunctivae normal.     Pupils: Pupils are equal, round, and reactive to light.  Cardiovascular:     Rate and Rhythm: Normal rate and regular rhythm.     Pulses: Normal pulses.     Heart sounds: Normal heart sounds. No murmur heard.   No friction rub. No gallop.  Pulmonary:     Effort: Pulmonary effort is normal. No respiratory distress.     Breath sounds: Normal breath sounds. No wheezing, rhonchi or rales.  Chest:     Chest wall: No tenderness.  Abdominal:     General: Bowel sounds are normal. There is no distension.     Palpations: Abdomen is soft. There is no mass.     Tenderness: There is no abdominal tenderness. There is no right CVA tenderness, left CVA tenderness, guarding or rebound.  Genitourinary:    Prostate: Normal.     Rectum: Guaiac result positive. No mass, tenderness, anal fissure, external hemorrhoid or internal hemorrhoid. Normal anal tone.  Skin:    General: Skin is warm and dry.     Coloration: Skin is not pale.     Findings: No bruising, erythema or rash.  Neurological:     Mental Status: He is alert and oriented to person, place, and time.     Cranial Nerves: Cranial nerve deficit present.     Motor: No weakness.     Gait: Gait normal.  Psychiatric:        Mood and  Affect: Mood normal.        Speech: Speech normal.        Behavior: Behavior normal.    Labs reviewed: Recent Labs    11/22/20  0920 01/01/21 0820 04/09/21 1133  NA 138 140 138  K 4.4 4.1 4.0  CL 101 104 101  CO2 29 29 28   GLUCOSE 92 83 93  BUN 10 11 15   CREATININE 1.30* 1.08 1.08  CALCIUM 9.5 9.6 9.2   Recent Labs    11/22/20 0920 01/01/21 0820 04/09/21 1133  AST 13 18 25   ALT 10 13 22   BILITOT 0.5 0.6 0.9  PROT 6.1 6.2 6.2   Recent Labs    05/23/20 0939 11/22/20 0920  WBC 5.4 5.0  NEUTROABS 3,262 2,830  HGB 15.9 15.4  HCT 45.4 48.1  MCV 97.0 95.6  PLT 214 233   Lab Results  Component Value Date   TSH 4.15 11/22/2020   No results found for: HGBA1C Lab Results  Component Value Date   CHOL 168 01/01/2021   HDL 65 01/01/2021   LDLCALC 83 01/01/2021   TRIG 104 01/01/2021   CHOLHDL 2.6 01/01/2021    Significant Diagnostic Results in last 30 days:  No results found.  Assessment/Plan  1. Dark stools Reports dark stool x 3-4 days. Abdomen non-distended,non- tender with + BS x 4.Rectal exam negative for hemorrhoids or tenderness.Stool guaiac positive.chaperone present. - advised to hold Asprin 81 mg tablet until evaluated by GI  - Advised to go to ED if he notice any bright red blood.  - CBC with Differential/Platelet - Ambulatory referral to Gastroenterology  2. Guaiac positive stools Hold Asprin  Will check CBC  - CBC with Differential/Platelet - Ambulatory referral to Gastroenterology  Family/ staff Communication: Reviewed plan of care with patient verbalized understanding   Labs/tests ordered:  - CBC with Differential/Platelet  Next Appointment: As needed if symptoms worsen or fail to improve    Sandrea Hughs, NP

## 2021-05-22 NOTE — Patient Instructions (Signed)
-   Hold Asprin 81 mg tablet

## 2021-05-25 NOTE — Progress Notes (Signed)
Cardiology Office Note:    Date:  05/25/2021   ID:  ASAHEL Larson, DOB August 13, 1934, MRN [id number]  PCP:  Lauree Chandler, NP   [facility] [place] [facility] Cardiologist:  Jenkins Rouge, MD      Referring MD: Lauree Chandler, NP   Chief Complaint:  No chief complaint on file.    Patient Profile:    Phillip Larson is a 87 y.o. male with:  Coronary artery disease Cath 10/14: CTO of the RCA with left-to-right collaterals Ischemic CM Echo 5/18: EF 45-50, inferior HK, GR 1 DD Mild aortic insufficiency Peripheral arterial disease ABIs/US 8/21: R 0.61; bilateral SFA disease (followed by [facility]) Hypertension Hyperlipidemia Hypothyroidism BPH Alzheimer's dementia DNR  Prior CV studies: Echocardiogram 09/13/16 Mild LVH, inferolateral HK, basal to mid inferior HK, EF 45-50, G1 DD, mild AI, AV sclerosis without stenosis, trivial MR, moderate LAE, normal RVSF, mild RAE  Cardiac catheterization 01/28/2013 LM 20 LAD 30% proximal 30% mid  S LCx  40% proximal and mid  RCA:  Known proximial CTO with good left to right collaterals EF 55% mild inferobasal wall hypokinesis     History of Present Illness: 87 y.o. history CAD, PVD HTN, HLD ischemic DCM. No angina compliant with meds. Volume status ok Some RLE claudication  He follows with Dr Doren Custard [facility] Last ABI in epic 0.61 on right 2019/12/29 Wife died 05/15/21 Some dark stools with primary in May 15, 2022 ASA stopped Guaiac positive Hct stable 33.5 05/22/21 Referred to GI His wife of 7 years passed earlier this year He has children in [place] and will likely be moving that way Still with claudication no rest pain     Past Medical History:  Diagnosis Date   Actinic keratosis    Alzheimer's disease ([place])    Anal fissure    Anxiety state, unspecified    Benign neoplasm of colon    BENIGN PROSTATIC HYPERTROPHY, HX OF    BPH (benign prostatic hypertrophy) with urinary obstruction 06/22/2008   Qualifier: Diagnosis of  By: Johnsie Cancel, MD, Rona Ravens    Cerebrovascular disease, unspecified    Coronary atherosclerosis of native coronary artery    Depressive disorder, not elsewhere classified    Diverticulosis of colon (without mention of hemorrhage)    Essential hypertension, benign    External hemorrhoids without mention of complication    Insomnia, unspecified    Mixed hyperlipidemia    Osteoarthrosis, unspecified whether generalized or localized, unspecified site    Other premature beats    Other psoriasis    Pain in joint, lower leg    Pain in joint, site unspecified    Palpitations    Pressure ulcer    Reflux esophagitis    Unspecified essential hypertension    Unspecified hereditary and idiopathic peripheral neuropathy    Unspecified hypothyroidism    Unspecified vitamin D deficiency     Current Medications: No outpatient medications have been marked as taking for the 06/01/21 encounter (Appointment) with Josue Hector, MD.     Allergies:   Iodine and Iohexol   Social History   Tobacco Use   Smoking status: Former    Packs/day: 2.00    Years: 25.00    Pack years: 50.00    Types: Cigarettes    Quit date: 04/22/1992    Years since quitting: 29.1   Smokeless tobacco: Never  Vaping Use   Vaping Use: Never used  Substance Use Topics   Alcohol use: Yes    Comment: beer  Drug use: No     Family Hx: The patient's family history includes Cancer in his father and mother.  Review of Systems  Gastrointestinal:  Negative for hematochezia.  Genitourinary:  Negative for hematuria.    EKGs/Labs/Other Test Reviewed:    EKG:   05/17/20 SR rate 41 PAC old IMI 06/01/2021 SR rate 20 old IMI  Recent Labs: 11/22/2020: TSH 4.15 04/09/2021: ALT 22; BUN 15; Creat 1.08; Potassium 4.0; Sodium 138 05/22/2021: Hemoglobin 13.8; Platelets 219   Recent Lipid Panel Lab Results  Component Value Date/Time   CHOL 168 01/01/2021 08:20 AM   TRIG 104 01/01/2021 08:20 AM   HDL 65 01/01/2021 08:20 AM   LDLCALC 83 01/01/2021  08:20 AM      Risk Assessment/Calculations:      Physical Exam:    VS:  There were no vitals taken for this visit.    Wt Readings from Last 3 Encounters:  05/22/21 164 lb (74.4 kg)  04/09/21 168 lb (76.2 kg)  04/04/21 166 lb (75.3 kg)     Affect appropriate Elderly white male  HEENT: normal Neck supple with no adenopathy JVP normal no bruits no thyromegaly Lungs clear with no wheezing and good diaphragmatic motion Heart:  S1/S2 SEM  murmur, no rub, gallop or click PMI normal Abdomen: benighn, BS positve, no tenderness, no AAA no bruit.  No HSM or HJR Decreased pedal pulses on right  No edema Neuro non-focal Skin warm and dry No muscular weakness        ASSESSMENT & PLAN:    1. Coronary artery disease involving native coronary artery of native heart without angina pectoris History of known chronic total occlusion of the RCA with left right collaterals.  Given age, dementia and DNR status contiue medical Rx no testing   2. Ischemic cardiomyopathy EF 55-60% by TTE 12/07/20 volume stable   3. PAD (peripheral artery disease) (HCC) Some claudication no ulcers no rest pain continue walking program Check ABI's and LE arterial duplex f/u Dr Doren Custard   4. Essential hypertension, benign Well controlled.  Continue current medications and low sodium Dash type diet.     5. Mixed hyperlipidemia Continue crestor    mild LFT elevation a year ago labs with primary   6. Alzheimer's Disease Management per primary care. Not much change with Namenda  7. Murmur, cardiac Mild AS mean gradient 12 mmhg and mild AR on TTE 12/07/20 observe given age,dementia and DNR status   8. GI:  anemia with guaiac positive stools 04/2021 Hct stable 33.5 referred to GI     Dispo:  F/U in [place] at [facility]   Medication Adjustments/Labs and Tests Ordered: Current medicines are reviewed at length with the patient today.  Concerns regarding medicines are outlined above.  Tests Ordered:  None    Medication Changes: No orders of the defined types were placed in this encounter.   Signed, Jenkins Rouge, MD  05/25/2021 1:57 PM    [facility] [address], [place], [place]  [address] Phone: [phone]; Fax: [phone]

## 2021-05-26 ENCOUNTER — Other Ambulatory Visit: Payer: Self-pay | Admitting: Nurse Practitioner

## 2021-05-28 ENCOUNTER — Encounter: Payer: Self-pay | Admitting: Physician Assistant

## 2021-05-28 NOTE — Telephone Encounter (Signed)
Patient has request refill on medication "Ambien 5mg :. Patient medication refilled 04/20/2021. Patient has Non Opioid Contract on file dated 06/03/2020. Patient has upcoming appointment dated 08/10/2021. Update Contract added to patient appointment note. Medication pend and sent to PCP Dewaine Oats Carlos American, NP for approval.

## 2021-06-01 ENCOUNTER — Ambulatory Visit: Payer: Medicare Other | Admitting: Cardiovascular Disease

## 2021-06-01 ENCOUNTER — Encounter: Payer: Self-pay | Admitting: Cardiovascular Disease

## 2021-06-01 ENCOUNTER — Other Ambulatory Visit: Payer: Self-pay

## 2021-06-01 VITALS — BP 126/68 | HR 72 | Ht 70.0 in | Wt 165.0 lb

## 2021-06-01 DIAGNOSIS — E782 Mixed hyperlipidemia: Secondary | ICD-10-CM | POA: Diagnosis not present

## 2021-06-01 DIAGNOSIS — I255 Ischemic cardiomyopathy: Secondary | ICD-10-CM | POA: Diagnosis not present

## 2021-06-01 DIAGNOSIS — I2510 Atherosclerotic heart disease of native coronary artery without angina pectoris: Secondary | ICD-10-CM

## 2021-06-01 DIAGNOSIS — I739 Peripheral vascular disease, unspecified: Secondary | ICD-10-CM

## 2021-06-01 NOTE — Patient Instructions (Signed)
Medication Instructions:  Your physician recommends that you continue on your current medications as directed. Please refer to the Current Medication list given to you today.  *If you need a refill on your cardiac medications before your next appointment, please call your pharmacy*  Lab Work: If you have labs (blood work) drawn today and your tests are completely normal, you will receive your results only by: [place] (if you have MyChart) OR A paper copy in the mail If you have any lab test that is abnormal or we need to change your treatment, we will call you to review the results.  Testing/Procedures: None ordered today.  Follow-Up: At [facility], you and your health needs are our priority.  As part of our continuing mission to provide you with exceptional heart care, we have created designated Provider Care Teams.  These Care Teams include your primary Cardiologist (physician) and Advanced Practice Providers (APPs -  Physician Assistants and Nurse Practitioners) who all work together to provide you with the care you need, when you need it.  We recommend signing up for the patient portal called "MyChart".  Sign up information is provided on this After Visit Summary.  MyChart is used to connect with patients for Virtual Visits (Telemedicine).  Patients are able to view lab/test results, encounter notes, upcoming appointments, etc.  Non-urgent messages can be sent to your provider as well.   To learn more about what you can do with MyChart, go to [email or web address].    Your next appointment:   As needed   The format for your next appointment:   In Person  Provider:   Jenkins Rouge, MD {

## 2021-06-07 NOTE — Progress Notes (Addendum)
06/08/2021 Phillip Larson [id number] 1934-12-13   ASSESSMENT AND PLAN:    Positive fecal occult blood test -     CBC with Differential/Platelet; Future -     Iron and TIBC; Future -Repeat DRE today showed Hemoccult negative, stool more of a yellow, pale color. -Colonoscopy normal 2015, no plan for repeat.  Likely if patient needed endoscopic procedure would benefit from upper diascopy. -Daughter would prefer to avoid procedures at this time, with negative Hemoccult in the office today, will evaluate for iron deficiency anemia, get CT abdomen and pelvis, and proceed from there. -We will do 2 to 4-week follow-up ER precautions discussed  Melena -     CBC with Differential/Platelet; Future -     CT ABDOMEN PELVIS WO CONTRAST; Future -     pantoprazole (PROTONIX) 40 MG tablet; Take 1 tablet (40 mg total) by mouth daily. -     H. pylori antibody, IgG; Future -With alcohol history, will start on PPI once daily, get H. pylori, repeat CBC. -Discussed possible endoscopy with daughter, prefers at this point to avoid endoscopic procedure with anesthesia if possible.  Recheck of fecal occult blood was negative today, will repeat CBC, iron, if this is low will consider following through with endoscopy. If it is negative can consider upper GI series.  Alcohol abuse -     CBC with Differential/Platelet; Future -     Comprehensive metabolic panel; Future -Discussed decreasing, discussed liver inflammation, fall risk, patient expresses understanding will try to cut back. - slightly palpatble liver edge, getting CT AB for weight loss  Loss of weight -     CT ABDOMEN PELVIS WO CONTRAST; Future -Possibly from passing of his wife recently in the past month, but has had decreased appetite, will schedule for CT abdomen pelvis without contrast since patient has allergy.  B12 deficiency -     Vitamin B12; Future -     Vitamin B1; Future -Unknown if patient is on supplement, has had worsening  imbalance and memory, will evaluate.     Future Appointments  Date Time Provider [place]  06/12/2021  3:30 PM [facility] 1 [facility] [place]  06/29/2021 11:00 AM Vladimir Crofts, PA-C [facility]  08/10/2021 10:30 AM Lauree Chandler, NP [facility] None  02/21/2022 11:00 AM Lauree Chandler, NP [facility] None    Patient Care Team: Lauree Chandler, NP as PCP - General (Geriatric Medicine) Josue Hector, MD as PCP - Cardiology (Cardiology) Clent Jacks, MD as Consulting Physician (Ophthalmology) Lamonte Sakai Rose Fillers, MD as Consulting Physician (Pulmonary Disease) Angelia Mould, MD as Consulting Physician (Vascular Surgery) Earlie Server, MD as Consulting Physician (Orthopedic Surgery) Wilford Corner, MD as Consulting Physician (Gastroenterology) Chesley Mires, MD as Consulting Physician (Pulmonary Disease)  HISTORY OF PRESENT ILLNESS: 87 y.o. male referred by Lauree Chandler, NP, with a past medical history of peripheral vascular disease, coronary artery disease with stable angina on Imdur, nitroglycerin as needed not on anticoagulation, Alzheimer's and others listed below presents for evaluation of melena, FOBT positive  Has had colon 2015 with Dr. Michail Sermon showed extensive tics, no recommended repeat.  Has never had EGD.  Daughter, Angela Nevin, is with patient, some history of her as he has dementia.  Patient's wife just passed a month ago from leukemia, goal is to go to [place] with him  Patient had dark stools x 2 weeks which as resolved and positive FOBT.  Possible pepto use, he is not on iron supplement.  Denies NSAIDS.  He does use ETOH, drinks gin every day, 2-3 drinks from the bottle a day has done for 15 years. Has had increase with wife passing. Per notes found from wife, had some elevated LFTS in the past and concern about liver. Most recent LFTs normal, no thrombocytopenia  Has  had decreased appetite, unknown if from depression with wife's passing   Denies dysphagia, very rare with pills per patient but comes and goes. No choking on meds/food.  He has had about 6 lb weight loss since Dec.  Patient denies GERD,  nausea, vomiting. Denies AB pain. Has BM once a day. Patient denies change in bowel habits, constipation, diarrhea, hematochezia.   No family history of GI malignancy.  Just saw Dr. Johnsie Cancel for routine appointment, Echocardiogram 12/07/2020 mild AS EF 55 to 60%, patient states he is doing well without chest pain/SOB. Has pain bilateral legs with walking that gets better. Has had some worsening imbalance.  Daughter is concerned and would prefer to avoid anesthesia if possible or procedures due to age and dementia.   External labs and notes reviewed this visit: Wt Readings from Last 3 Encounters:  06/08/21 162 lb 2 oz (73.5 kg)  06/01/21 165 lb (74.8 kg)  05/22/21 164 lb (74.4 kg)   01/04/2014 colonoscopy due to personal history of colon polyps, previous 05/11/2008, with Dr. Michail Sermon [facility] showed multiple small and large mouth diverticula sigmoid and descending colon, internal hemorrhoids small otherwise normal.  No repeat colonoscopy recommended.  05/22/2021  HGB 13.8 MCV 98.5 without evidence of anemia WBC 6.8 Platelets 219 Low normal B12 2014 320 04/09/2021  BUN 15 Cr 1.08  GFR 56  Potassium 4.0   04/09/2021  AST 25 ALT 22  TBili 0.9 11/22/2020 TSH 4.15   Current Medications:   Current Outpatient Medications (Endocrine & Metabolic):    levothyroxine (SYNTHROID) 88 MCG tablet, TAKE 1 TABLET BY MOUTH IN THE MORNING 30 MINUTES BEFORE BREAKFAST ON AN EMPTY STOMACH  Current Outpatient Medications (Cardiovascular):    diltiazem (CARDIZEM CD) 240 MG 24 hr capsule, Take 1 capsule (240 mg total) by mouth daily.   isosorbide mononitrate (IMDUR) 30 MG 24 hr tablet, Take 1 tablet by mouth once daily   rosuvastatin (CRESTOR) 5 MG tablet, Take 5 mg by mouth every Monday, Wednesday, and Friday.   nitroGLYCERIN (NITROSTAT) 0.4  MG SL tablet, One under the tongue if needed for chest tightness. Repeat in 5 minutes if needed. Repeat again in 5 minutes if needed. (Patient not taking: Reported on 06/08/2021)  Current Outpatient Medications (Respiratory):    cetirizine (ZYRTEC) 10 MG tablet, Take 1 tablet (10 mg total) by mouth daily.    Current Outpatient Medications (Other):    b complex vitamins tablet, Take 1 tablet by mouth daily.   cholecalciferol (VITAMIN D) 1000 UNITS tablet, Take 1,000 Units by mouth daily.   memantine (NAMENDA) 10 MG tablet, Take 1 tablet (10 mg total) by mouth 2 (two) times daily.   mirabegron ER (MYRBETRIQ) 50 MG TB24 tablet, Take 50 mg by mouth daily.   Multiple Vitamin (MULTIVITAMIN WITH MINERALS) TABS tablet, Take 1 tablet by mouth daily.   ondansetron (ZOFRAN-ODT) 4 MG disintegrating tablet, Take 1 tablet (4 mg total) by mouth 3 (three) times daily with meals.   pantoprazole (PROTONIX) 40 MG tablet, Take 1 tablet (40 mg total) by mouth daily.   sertraline (ZOLOFT) 50 MG tablet, Take 1 tablet by mouth once daily   tamsulosin (FLOMAX) 0.4 MG CAPS capsule,  TAKE ONE CAPSULE BY MOUTH ONCE DAILY TO HELP RELIEVE OBSTRUCTION   traZODone (DESYREL) 100 MG tablet, Take 1 tablet (100 mg total) by mouth at bedtime.   vitamin C (ASCORBIC ACID) 500 MG tablet, Take 500 mg by mouth daily.   zinc gluconate 50 MG tablet, Take 50 mg by mouth at bedtime.   zolpidem (AMBIEN) 5 MG tablet, TAKE 1 TABLET BY MOUTH ONCE DAILY AT BEDTIME  Medical History:  Past Medical History:  Diagnosis Date   Actinic keratosis    Alzheimer's disease ([place])    Anal fissure    Anxiety state, unspecified    Benign neoplasm of colon    BENIGN PROSTATIC HYPERTROPHY, HX OF    BPH (benign prostatic hypertrophy) with urinary obstruction 06/22/2008   Qualifier: Diagnosis of  By: Johnsie Cancel, MD, Rona Ravens    Cerebrovascular disease, unspecified    Coronary atherosclerosis of native coronary artery    Depressive disorder, not  elsewhere classified    Diverticulosis of colon (without mention of hemorrhage)    Elevated LFTs    Essential hypertension, benign    External hemorrhoids without mention of complication    Insomnia, unspecified    Internal hemorrhoids    Mixed hyperlipidemia    Osteoarthrosis, unspecified whether generalized or localized, unspecified site    Other premature beats    Other psoriasis    Pain in joint, lower leg    Pain in joint, site unspecified    Palpitations    Pressure ulcer    Reflux esophagitis    Unspecified essential hypertension    Unspecified hereditary and idiopathic peripheral neuropathy    Unspecified hypothyroidism    Unspecified vitamin D deficiency    Allergies:  Allergies  Allergen Reactions   Iodine Swelling    Internal iodine   Iohexol Hives          Surgical History:  He  has a past surgical history that includes Knee surgery (Left, 07/18/2006); LAMINECTOMY C3 disc (04/22/1970); Retinal tear repair cryotherapy; Multiple tooth extractions (04/22/2012); Cataract extraction (Left, 02/07/2014); and left heart catheterization with coronary angiogram (N/A, 01/28/2013). Family History:  His family history includes ADD / ADHD in his son; Anxiety disorder in his daughter; Diverticulitis in his mother; Hyperlipidemia in his mother; Pancreatic cancer in his father and mother. Social History:   reports that he quit smoking about 29 years ago. His smoking use included cigarettes. He has a 50.00 pack-year smoking history. He has never used smokeless tobacco. He reports current alcohol use. He reports that he does not use drugs.  REVIEW OF SYSTEMS  : All other systems reviewed and negative except where noted in the History of Present Illness.   PHYSICAL EXAM: BP 106/60 (BP Location: Left Arm, Patient Position: Sitting, Cuff Size: Normal)    Pulse 84    Ht 5' 7.75" (1.721 m) Comment: height measured without shoes   Wt 162 lb 2 oz (73.5 kg)    BMI 24.83 kg/m  General:    Pleasant, well developed male in no acute distress Head:  Normocephalic and atraumatic. Eyes: sclerae anicteric,conjunctive pink  Heart:  regular rate and rhythm, systolic murmur Pulm: Decreased breath sounds, slight wheezing Abdomen:  Soft, Obese AB, skin exam normal, Normal bowel sounds.  no  tenderness. Without guarding and Without rebound,  palpable but soft liver edge . Extremities:  Without edema. Msk:  Symmetrical without gross deformities. Peripheral pulses intact but decreased Neurologic:  Alert and  oriented x2; poor short-term memory, grossly normal  neurologically. Skin:   Dry and intact without significant lesions or rashes. Psychiatric: Demonstrates good judgement and reason without abnormal affect or behaviors.  Systolic murmur Negative hemoccult, yellow loose stool  Vladimir Crofts, PA-C 12:36 PM

## 2021-06-08 ENCOUNTER — Other Ambulatory Visit (INDEPENDENT_AMBULATORY_CARE_PROVIDER_SITE_OTHER): Payer: Medicare Other

## 2021-06-08 ENCOUNTER — Ambulatory Visit: Payer: Medicare Other | Admitting: Physician Assistant

## 2021-06-08 ENCOUNTER — Encounter: Payer: Self-pay | Admitting: Physician Assistant

## 2021-06-08 VITALS — BP 106/60 | HR 84 | Ht 67.75 in | Wt 162.1 lb

## 2021-06-08 DIAGNOSIS — F1010 Alcohol abuse, uncomplicated: Secondary | ICD-10-CM

## 2021-06-08 DIAGNOSIS — K921 Melena: Secondary | ICD-10-CM

## 2021-06-08 DIAGNOSIS — E538 Deficiency of other specified B group vitamins: Secondary | ICD-10-CM

## 2021-06-08 DIAGNOSIS — R634 Abnormal weight loss: Secondary | ICD-10-CM | POA: Diagnosis not present

## 2021-06-08 DIAGNOSIS — D649 Anemia, unspecified: Secondary | ICD-10-CM | POA: Diagnosis not present

## 2021-06-08 DIAGNOSIS — R195 Other fecal abnormalities: Secondary | ICD-10-CM

## 2021-06-08 LAB — COMPREHENSIVE METABOLIC PANEL
ALT: 32 U/L (ref 0–53)
AST: 48 U/L — ABNORMAL HIGH (ref 0–37)
Albumin: 4.5 g/dL (ref 3.5–5.2)
Alkaline Phosphatase: 87 U/L (ref 39–117)
BUN: 17 mg/dL (ref 6–23)
CO2: 31 mEq/L (ref 19–32)
Calcium: 9.7 mg/dL (ref 8.4–10.5)
Chloride: 102 mEq/L (ref 96–112)
Creatinine, Ser: 1.14 mg/dL (ref 0.40–1.50)
GFR: 59 mL/min — ABNORMAL LOW (ref >60.00)
Glucose, Bld: 80 mg/dL (ref 70–99)
Potassium: 4.2 mEq/L (ref 3.5–5.1)
Sodium: 143 mEq/L (ref 135–145)
Total Bilirubin: 1 mg/dL (ref 0.2–1.2)
Total Protein: 6.6 g/dL (ref 6.0–8.3)

## 2021-06-08 LAB — VITAMIN B12: Vitamin B-12: 444 pg/mL (ref 211–911)

## 2021-06-08 LAB — CBC WITH DIFFERENTIAL/PLATELET
Basophils Absolute: 0 10*3/uL (ref 0.0–0.1)
Basophils Relative: 0.8 % (ref 0.0–3.0)
Eosinophils Absolute: 0.2 10*3/uL (ref 0.0–0.7)
Eosinophils Relative: 3.8 % (ref 0.0–5.0)
HCT: 40.7 % (ref 39.0–52.0)
Hemoglobin: 13.6 g/dL (ref 13.0–17.0)
Lymphocytes Relative: 19.3 % (ref 12.0–46.0)
Lymphs Abs: 0.9 10*3/uL (ref 0.7–4.0)
MCHC: 33.6 g/dL (ref 30.0–36.0)
MCV: 100.2 fl — ABNORMAL HIGH (ref 78.0–100.0)
Monocytes Absolute: 0.3 10*3/uL (ref 0.1–1.0)
Monocytes Relative: 6.8 % (ref 3.0–12.0)
Neutro Abs: 3.2 10*3/uL (ref 1.4–7.7)
Neutrophils Relative %: 69.3 % (ref 43.0–77.0)
Platelets: 210 10*3/uL (ref 150.0–400.0)
RBC: 4.06 Mil/uL — ABNORMAL LOW (ref 4.22–5.81)
RDW: 14.7 % (ref 11.5–15.5)
WBC: 4.6 10*3/uL (ref 4.0–10.5)

## 2021-06-08 LAB — H. PYLORI ANTIBODY, IGG: H Pylori IgG: NEGATIVE

## 2021-06-08 MED ORDER — PANTOPRAZOLE SODIUM 40 MG PO TBEC: 40.0000 mg | DELAYED_RELEASE_TABLET | Freq: Every day | ORAL | 3 refills | Status: AC

## 2021-06-08 NOTE — Patient Instructions (Addendum)
You have been scheduled for a follow up with [facility] on 06/29/21 at 11:00 am.  Your provider has requested that you go to the basement level for lab work before leaving today. Press "B" on the elevator. The lab is located at the first door on the left as you exit the elevator.  -------- You have been scheduled for a CT scan of the abdomen and pelvis at [facility], 1st floor Radiology. You are scheduled on 06/12/21  at 3:30 pm. You should arrive 15 minutes prior to your appointment time for registration.Your contrast solution may taste better if refrigerated, but do NOT add ice or any other liquid to this solution. Shake well before drinking.   Please follow the written instructions below on the day of your exam:   1) Do not eat anything after 11:30 am (4 hours prior to your test)   2) Drink 1 bottle of contrast @ 1:30 pm (2 hours prior to your exam)  Remember to shake well before drinking and do NOT pour over ice.     Drink 1 bottle of contrast @ 2:30 pm (1 hour prior to your exam)   You may take any medications as prescribed with a small amount of water, if necessary. If you take any of the following medications: METFORMIN, GLUCOPHAGE, GLUCOVANCE, AVANDAMET, RIOMET, FORTAMET, [place] MET, JANUMET, GLUMETZA or METAGLIP, you MAY be asked to HOLD this medication 48 hours AFTER the exam.   The purpose of you drinking the oral contrast is to aid in the visualization of your intestinal tract. The contrast solution may cause some diarrhea. Plan on being at [facility] for 45 minutes or longer, depending on the type of exam you are having performed.   If you have any questions regarding your exam or if you need to reschedule, you may call [facility] at [phone] between the hours of 8:00 am and 5:00 pm, Monday-Friday.   --------------------  Cut back or stop alcohol  Will go to the ER if they have any melena (black stool), hematochezia (blood in stool), coffee  ground vomiting, severe pain, severe shortness of breath or chest pain.   Please take your proton pump inhibitor medication 30 minutes to 1 hour before meals- this makes it more effective.  Avoid spicy and acidic foods Avoid fatty foods Limit your intake of coffee, tea, alcohol, and carbonated drinks Work to maintain a healthy weight Keep the head of the bed elevated at least 3 inches with blocks or a wedge pillow if you are having any nighttime symptoms Stay upright for 2 hours after eating Avoid meals and snacks three to four hours before bedtime  Gastroesophageal Reflux Disease, Adult Gastroesophageal reflux (GER) happens when acid from the stomach flows up into the tube that connects the mouth and the stomach (esophagus). Normally, food travels down the esophagus and stays in the stomach to be digested. However, when a person has GER, food and stomach acid sometimes move back up into the esophagus. If this becomes a more serious problem, the person may be diagnosed with a disease called gastroesophageal reflux disease (GERD). GERD occurs when the reflux: Happens often. Causes frequent or severe symptoms. Causes problems such as damage to the esophagus. When stomach acid comes in contact with the esophagus, the acid may cause inflammation in the esophagus. Over time, GERD may create small holes (ulcers) in the lining of the esophagus. What are the causes? This condition is caused by a problem with the muscle  between the esophagus and the stomach (lower esophageal sphincter, or LES). Normally, the LES muscle closes after food passes through the esophagus to the stomach. When the LES is weakened or abnormal, it does not close properly, and that allows food and stomach acid to go back up into the esophagus. The LES can be weakened by certain dietary substances, medicines, and medical conditions, including: Tobacco use. Pregnancy. Having a hiatal hernia. Alcohol use. Certain foods and  beverages, such as coffee, chocolate, onions, and peppermint. What increases the risk? You are more likely to develop this condition if you: Have an increased body weight. Have a connective tissue disorder. Take NSAIDs, such as ibuprofen. What are the signs or symptoms? Symptoms of this condition include: Heartburn. Difficult or painful swallowing and the feeling of having a lump in the throat. A bitter taste in the mouth. Bad breath and having a large amount of saliva. Having an upset or bloated stomach and belching. Chest pain. Different conditions can cause chest pain. Make sure you see your health care provider if you experience chest pain. Shortness of breath or wheezing. Ongoing (chronic) cough or a nighttime cough. Wearing away of tooth enamel. Weight loss. How is this diagnosed? This condition may be diagnosed based on a medical history and a physical exam. To determine if you have mild or severe GERD, your health care provider may also monitor how you respond to treatment. You may also have tests, including: A test to examine your stomach and esophagus with a small camera (endoscopy). A test that measures the acidity level in your esophagus. A test that measures how much pressure is on your esophagus. A barium swallow or modified barium swallow test to show the shape, size, and functioning of your esophagus. How is this treated? Treatment for this condition may vary depending on how severe your symptoms are. Your health care provider may recommend: Changes to your diet. Medicine. Surgery. The goal of treatment is to help relieve your symptoms and to prevent complications. Follow these instructions at home: Eating and drinking  Follow a diet as recommended by your health care provider. This may involve avoiding foods and drinks such as: Coffee and tea, with or without caffeine. Drinks that contain alcohol. Energy drinks and sports drinks. Carbonated drinks or  sodas. Chocolate and cocoa. Peppermint and mint flavorings. Garlic and onions. Horseradish. Spicy and acidic foods, including peppers, chili powder, curry powder, vinegar, hot sauces, and barbecue sauce. Citrus fruit juices and citrus fruits, such as oranges, lemons, and limes. Tomato-based foods, such as red sauce, chili, salsa, and pizza with red sauce. Fried and fatty foods, such as donuts, french fries, potato chips, and high-fat dressings. High-fat meats, such as hot dogs and fatty cuts of red and white meats, such as rib eye steak, sausage, ham, and bacon. High-fat dairy items, such as whole milk, butter, and cream cheese. Eat small, frequent meals instead of large meals. Avoid drinking large amounts of liquid with your meals. Avoid eating meals during the 2-3 hours before bedtime. Avoid lying down right after you eat. Do not exercise right after you eat. Lifestyle  Do not use any products that contain nicotine or tobacco. These products include cigarettes, chewing tobacco, and vaping devices, such as e-cigarettes. If you need help quitting, ask your health care provider. Try to reduce your stress by using methods such as yoga or meditation. If you need help reducing stress, ask your health care provider. If you are overweight, reduce your weight to an  amount that is healthy for you. Ask your health care provider for guidance about a safe weight loss goal. General instructions Pay attention to any changes in your symptoms. Take over-the-counter and prescription medicines only as told by your health care provider. Do not take aspirin, ibuprofen, or other NSAIDs unless your health care provider told you to take these medicines. Wear loose-fitting clothing. Do not wear anything tight around your waist that causes pressure on your abdomen. Raise (elevate) the head of your bed about 6 inches (15 cm). You can use a wedge to do this. Avoid bending over if this makes your symptoms  worse. Keep all follow-up visits. This is important. Contact a health care provider if: You have: New symptoms. Unexplained weight loss. Difficulty swallowing or it hurts to swallow. Wheezing or a persistent cough. A hoarse voice. Your symptoms do not improve with treatment. Get help right away if: You have sudden pain in your arms, neck, jaw, teeth, or back. You suddenly feel sweaty, dizzy, or light-headed. You have chest pain or shortness of breath. You vomit and the vomit is green, yellow, or black, or it looks like blood or coffee grounds. You faint. You have stool that is red, bloody, or black. You cannot swallow, drink, or eat. These symptoms may represent a serious problem that is an emergency. Do not wait to see if the symptoms will go away. Get medical help right away. Call your local emergency services (911 in the [place] Do not drive yourself to the hospital. Summary Gastroesophageal reflux happens when acid from the stomach flows up into the esophagus. GERD is a disease in which the reflux happens often, causes frequent or severe symptoms, or causes problems such as damage to the esophagus. Treatment for this condition may vary depending on how severe your symptoms are. Your health care provider may recommend diet and lifestyle changes, medicine, or surgery. Contact a health care provider if you have new or worsening symptoms. Take over-the-counter and prescription medicines only as told by your health care provider. Do not take aspirin, ibuprofen, or other NSAIDs unless your health care provider told you to do so. Keep all follow-up visits as told by your health care provider. This is important. This information is not intended to replace advice given to you by your health care provider. Make sure you discuss any questions you have with your health care provider. Document Revised: 10/18/2019 Document Reviewed: 10/18/2019 Elsevier Patient Education  [place].

## 2021-06-09 ENCOUNTER — Other Ambulatory Visit: Payer: Self-pay | Admitting: Nurse Practitioner

## 2021-06-09 DIAGNOSIS — F0280 Dementia in other diseases classified elsewhere without behavioral disturbance: Secondary | ICD-10-CM

## 2021-06-09 DIAGNOSIS — G301 Alzheimer's disease with late onset: Secondary | ICD-10-CM

## 2021-06-09 LAB — IRON AND TIBC
Iron Saturation: 20 % (ref 15–55)
Iron: 55 ug/dL (ref 38–169)
Total Iron Binding Capacity: 280 ug/dL (ref 250–450)
UIBC: 225 ug/dL (ref 111–343)

## 2021-06-11 NOTE — Progress Notes (Signed)
I agree with the above note, plan 

## 2021-06-12 ENCOUNTER — Ambulatory Visit (HOSPITAL_COMMUNITY): Payer: Medicare Other | Attending: Physician Assistant

## 2021-06-14 LAB — VITAMIN B1: Vitamin B1 (Thiamine): 107 nmol/L — ABNORMAL HIGH (ref 8–30)

## 2021-06-27 ENCOUNTER — Other Ambulatory Visit: Payer: Self-pay

## 2021-06-27 MED ORDER — ZOLPIDEM TARTRATE 5 MG PO TABS: 5.0000 mg | ORAL_TABLET | Freq: Every day | ORAL | 0 refills | Status: AC

## 2021-06-27 MED ORDER — ROSUVASTATIN CALCIUM 5 MG PO TABS: 5.0000 mg | ORAL_TABLET | ORAL | 0 refills | Status: DC

## 2021-06-27 NOTE — Telephone Encounter (Signed)
Patient recently moved to [facility] and has not established with a PCP yet.  ? ?Patients daughter called stating patient is in need of  ?1.) Ambien, last refilled on 05/28/21. Treatment agreement on file is dated 05/26/2020 ? ?2.) Crestor ? ?I informed Angela Nevin that her father will need to establish with a PCP within the next 30 days for future refills and she verbalized understanding.  ?

## 2021-06-29 ENCOUNTER — Ambulatory Visit: Payer: Medicare Other | Admitting: Physician Assistant

## 2021-07-28 ENCOUNTER — Other Ambulatory Visit: Payer: Self-pay | Admitting: Nurse Practitioner

## 2021-08-10 ENCOUNTER — Encounter: Payer: Medicare Other | Admitting: Nurse Practitioner

## 2021-08-13 NOTE — Progress Notes (Signed)
This encounter was created in error - please disregard.

## 2021-08-17 DIAGNOSIS — J309 Allergic rhinitis, unspecified: Secondary | ICD-10-CM | POA: Diagnosis not present

## 2021-08-17 DIAGNOSIS — R600 Localized edema: Secondary | ICD-10-CM | POA: Diagnosis not present

## 2021-08-17 DIAGNOSIS — I2510 Atherosclerotic heart disease of native coronary artery without angina pectoris: Secondary | ICD-10-CM | POA: Diagnosis not present

## 2021-08-17 DIAGNOSIS — N3281 Overactive bladder: Secondary | ICD-10-CM | POA: Diagnosis not present

## 2021-08-17 DIAGNOSIS — E039 Hypothyroidism, unspecified: Secondary | ICD-10-CM | POA: Diagnosis not present

## 2021-08-17 DIAGNOSIS — I10 Essential (primary) hypertension: Secondary | ICD-10-CM | POA: Diagnosis not present

## 2021-08-17 DIAGNOSIS — E785 Hyperlipidemia, unspecified: Secondary | ICD-10-CM | POA: Diagnosis not present

## 2021-08-17 DIAGNOSIS — F03B0 Unspecified dementia, moderate, without behavioral disturbance, psychotic disturbance, mood disturbance, and anxiety: Secondary | ICD-10-CM | POA: Diagnosis not present

## 2021-08-17 DIAGNOSIS — G4700 Insomnia, unspecified: Secondary | ICD-10-CM | POA: Diagnosis not present

## 2021-08-22 ENCOUNTER — Other Ambulatory Visit: Payer: Self-pay | Admitting: Nurse Practitioner

## 2021-08-30 DIAGNOSIS — M6281 Muscle weakness (generalized): Secondary | ICD-10-CM | POA: Diagnosis not present

## 2021-08-30 DIAGNOSIS — R2689 Other abnormalities of gait and mobility: Secondary | ICD-10-CM | POA: Diagnosis not present

## 2021-08-30 DIAGNOSIS — R2681 Unsteadiness on feet: Secondary | ICD-10-CM | POA: Diagnosis not present

## 2021-09-03 DIAGNOSIS — R2689 Other abnormalities of gait and mobility: Secondary | ICD-10-CM | POA: Diagnosis not present

## 2021-09-03 DIAGNOSIS — M6281 Muscle weakness (generalized): Secondary | ICD-10-CM | POA: Diagnosis not present

## 2021-09-03 DIAGNOSIS — R2681 Unsteadiness on feet: Secondary | ICD-10-CM | POA: Diagnosis not present

## 2021-09-05 DIAGNOSIS — R2681 Unsteadiness on feet: Secondary | ICD-10-CM | POA: Diagnosis not present

## 2021-09-05 DIAGNOSIS — R2689 Other abnormalities of gait and mobility: Secondary | ICD-10-CM | POA: Diagnosis not present

## 2021-09-05 DIAGNOSIS — M6281 Muscle weakness (generalized): Secondary | ICD-10-CM | POA: Diagnosis not present

## 2021-09-06 DIAGNOSIS — R2689 Other abnormalities of gait and mobility: Secondary | ICD-10-CM | POA: Diagnosis not present

## 2021-09-06 DIAGNOSIS — R2681 Unsteadiness on feet: Secondary | ICD-10-CM | POA: Diagnosis not present

## 2021-09-06 DIAGNOSIS — M6281 Muscle weakness (generalized): Secondary | ICD-10-CM | POA: Diagnosis not present

## 2021-09-10 DIAGNOSIS — R2681 Unsteadiness on feet: Secondary | ICD-10-CM | POA: Diagnosis not present

## 2021-09-10 DIAGNOSIS — M6281 Muscle weakness (generalized): Secondary | ICD-10-CM | POA: Diagnosis not present

## 2021-09-10 DIAGNOSIS — R2689 Other abnormalities of gait and mobility: Secondary | ICD-10-CM | POA: Diagnosis not present

## 2021-09-12 DIAGNOSIS — R2681 Unsteadiness on feet: Secondary | ICD-10-CM | POA: Diagnosis not present

## 2021-09-12 DIAGNOSIS — R2689 Other abnormalities of gait and mobility: Secondary | ICD-10-CM | POA: Diagnosis not present

## 2021-09-12 DIAGNOSIS — M6281 Muscle weakness (generalized): Secondary | ICD-10-CM | POA: Diagnosis not present

## 2021-09-13 DIAGNOSIS — R2681 Unsteadiness on feet: Secondary | ICD-10-CM | POA: Diagnosis not present

## 2021-09-13 DIAGNOSIS — M6281 Muscle weakness (generalized): Secondary | ICD-10-CM | POA: Diagnosis not present

## 2021-09-13 DIAGNOSIS — R2689 Other abnormalities of gait and mobility: Secondary | ICD-10-CM | POA: Diagnosis not present

## 2021-09-18 DIAGNOSIS — R2689 Other abnormalities of gait and mobility: Secondary | ICD-10-CM | POA: Diagnosis not present

## 2021-09-18 DIAGNOSIS — M6281 Muscle weakness (generalized): Secondary | ICD-10-CM | POA: Diagnosis not present

## 2021-09-18 DIAGNOSIS — R2681 Unsteadiness on feet: Secondary | ICD-10-CM | POA: Diagnosis not present

## 2021-09-19 DIAGNOSIS — E039 Hypothyroidism, unspecified: Secondary | ICD-10-CM | POA: Diagnosis not present

## 2021-09-19 DIAGNOSIS — M6281 Muscle weakness (generalized): Secondary | ICD-10-CM | POA: Diagnosis not present

## 2021-09-19 DIAGNOSIS — I739 Peripheral vascular disease, unspecified: Secondary | ICD-10-CM | POA: Diagnosis not present

## 2021-09-19 DIAGNOSIS — I25118 Atherosclerotic heart disease of native coronary artery with other forms of angina pectoris: Secondary | ICD-10-CM | POA: Diagnosis not present

## 2021-09-19 DIAGNOSIS — H9193 Unspecified hearing loss, bilateral: Secondary | ICD-10-CM | POA: Diagnosis not present

## 2021-09-19 DIAGNOSIS — R2689 Other abnormalities of gait and mobility: Secondary | ICD-10-CM | POA: Diagnosis not present

## 2021-09-19 DIAGNOSIS — R2681 Unsteadiness on feet: Secondary | ICD-10-CM | POA: Diagnosis not present

## 2021-09-19 DIAGNOSIS — I10 Essential (primary) hypertension: Secondary | ICD-10-CM | POA: Diagnosis not present

## 2021-09-20 DIAGNOSIS — R2689 Other abnormalities of gait and mobility: Secondary | ICD-10-CM | POA: Diagnosis not present

## 2021-09-20 DIAGNOSIS — M6281 Muscle weakness (generalized): Secondary | ICD-10-CM | POA: Diagnosis not present

## 2021-09-20 DIAGNOSIS — R2681 Unsteadiness on feet: Secondary | ICD-10-CM | POA: Diagnosis not present

## 2021-09-24 DIAGNOSIS — E860 Dehydration: Secondary | ICD-10-CM | POA: Diagnosis not present

## 2021-09-24 DIAGNOSIS — E871 Hypo-osmolality and hyponatremia: Secondary | ICD-10-CM | POA: Diagnosis not present

## 2021-09-24 DIAGNOSIS — G629 Polyneuropathy, unspecified: Secondary | ICD-10-CM | POA: Diagnosis not present

## 2021-09-24 DIAGNOSIS — E162 Hypoglycemia, unspecified: Secondary | ICD-10-CM | POA: Diagnosis not present

## 2021-09-24 DIAGNOSIS — Z634 Disappearance and death of family member: Secondary | ICD-10-CM | POA: Diagnosis not present

## 2021-09-24 DIAGNOSIS — R531 Weakness: Secondary | ICD-10-CM | POA: Diagnosis not present

## 2021-09-24 DIAGNOSIS — Z7982 Long term (current) use of aspirin: Secondary | ICD-10-CM | POA: Diagnosis not present

## 2021-09-24 DIAGNOSIS — R011 Cardiac murmur, unspecified: Secondary | ICD-10-CM | POA: Diagnosis not present

## 2021-09-24 DIAGNOSIS — I10 Essential (primary) hypertension: Secondary | ICD-10-CM | POA: Diagnosis not present

## 2021-09-24 DIAGNOSIS — R0989 Other specified symptoms and signs involving the circulatory and respiratory systems: Secondary | ICD-10-CM | POA: Diagnosis not present

## 2021-09-24 DIAGNOSIS — F32A Depression, unspecified: Secondary | ICD-10-CM | POA: Diagnosis not present

## 2021-09-24 DIAGNOSIS — E869 Volume depletion, unspecified: Secondary | ICD-10-CM | POA: Diagnosis not present

## 2021-09-24 DIAGNOSIS — R112 Nausea with vomiting, unspecified: Secondary | ICD-10-CM | POA: Diagnosis not present

## 2021-09-24 DIAGNOSIS — R2689 Other abnormalities of gait and mobility: Secondary | ICD-10-CM | POA: Diagnosis not present

## 2021-09-24 DIAGNOSIS — R2681 Unsteadiness on feet: Secondary | ICD-10-CM | POA: Diagnosis not present

## 2021-09-24 DIAGNOSIS — N179 Acute kidney failure, unspecified: Secondary | ICD-10-CM | POA: Diagnosis not present

## 2021-09-24 DIAGNOSIS — R509 Fever, unspecified: Secondary | ICD-10-CM | POA: Diagnosis not present

## 2021-09-24 DIAGNOSIS — R7401 Elevation of levels of liver transaminase levels: Secondary | ICD-10-CM | POA: Diagnosis not present

## 2021-09-24 DIAGNOSIS — M6281 Muscle weakness (generalized): Secondary | ICD-10-CM | POA: Diagnosis not present

## 2021-09-28 DIAGNOSIS — R2689 Other abnormalities of gait and mobility: Secondary | ICD-10-CM | POA: Diagnosis not present

## 2021-09-28 DIAGNOSIS — R2681 Unsteadiness on feet: Secondary | ICD-10-CM | POA: Diagnosis not present

## 2021-09-28 DIAGNOSIS — M6281 Muscle weakness (generalized): Secondary | ICD-10-CM | POA: Diagnosis not present

## 2021-10-01 DIAGNOSIS — M6281 Muscle weakness (generalized): Secondary | ICD-10-CM | POA: Diagnosis not present

## 2021-10-01 DIAGNOSIS — R2681 Unsteadiness on feet: Secondary | ICD-10-CM | POA: Diagnosis not present

## 2021-10-01 DIAGNOSIS — R2689 Other abnormalities of gait and mobility: Secondary | ICD-10-CM | POA: Diagnosis not present

## 2021-10-02 DIAGNOSIS — R2689 Other abnormalities of gait and mobility: Secondary | ICD-10-CM | POA: Diagnosis not present

## 2021-10-02 DIAGNOSIS — R2681 Unsteadiness on feet: Secondary | ICD-10-CM | POA: Diagnosis not present

## 2021-10-02 DIAGNOSIS — M6281 Muscle weakness (generalized): Secondary | ICD-10-CM | POA: Diagnosis not present

## 2021-10-08 DIAGNOSIS — E871 Hypo-osmolality and hyponatremia: Secondary | ICD-10-CM | POA: Diagnosis not present

## 2021-10-08 DIAGNOSIS — Z7689 Persons encountering health services in other specified circumstances: Secondary | ICD-10-CM | POA: Diagnosis not present

## 2021-10-08 DIAGNOSIS — I10 Essential (primary) hypertension: Secondary | ICD-10-CM | POA: Diagnosis not present

## 2021-10-09 DIAGNOSIS — M6281 Muscle weakness (generalized): Secondary | ICD-10-CM | POA: Diagnosis not present

## 2021-10-09 DIAGNOSIS — R2689 Other abnormalities of gait and mobility: Secondary | ICD-10-CM | POA: Diagnosis not present

## 2021-10-09 DIAGNOSIS — R2681 Unsteadiness on feet: Secondary | ICD-10-CM | POA: Diagnosis not present

## 2021-10-10 DIAGNOSIS — M6281 Muscle weakness (generalized): Secondary | ICD-10-CM | POA: Diagnosis not present

## 2021-10-10 DIAGNOSIS — R2689 Other abnormalities of gait and mobility: Secondary | ICD-10-CM | POA: Diagnosis not present

## 2021-10-10 DIAGNOSIS — R2681 Unsteadiness on feet: Secondary | ICD-10-CM | POA: Diagnosis not present

## 2021-10-11 DIAGNOSIS — R2681 Unsteadiness on feet: Secondary | ICD-10-CM | POA: Diagnosis not present

## 2021-10-11 DIAGNOSIS — M6281 Muscle weakness (generalized): Secondary | ICD-10-CM | POA: Diagnosis not present

## 2021-10-11 DIAGNOSIS — R2689 Other abnormalities of gait and mobility: Secondary | ICD-10-CM | POA: Diagnosis not present

## 2021-10-15 DIAGNOSIS — R2689 Other abnormalities of gait and mobility: Secondary | ICD-10-CM | POA: Diagnosis not present

## 2021-10-15 DIAGNOSIS — M6281 Muscle weakness (generalized): Secondary | ICD-10-CM | POA: Diagnosis not present

## 2021-10-15 DIAGNOSIS — R2681 Unsteadiness on feet: Secondary | ICD-10-CM | POA: Diagnosis not present

## 2021-10-18 DIAGNOSIS — R2681 Unsteadiness on feet: Secondary | ICD-10-CM | POA: Diagnosis not present

## 2021-10-18 DIAGNOSIS — M6281 Muscle weakness (generalized): Secondary | ICD-10-CM | POA: Diagnosis not present

## 2021-10-18 DIAGNOSIS — R2689 Other abnormalities of gait and mobility: Secondary | ICD-10-CM | POA: Diagnosis not present

## 2021-10-19 DIAGNOSIS — R2681 Unsteadiness on feet: Secondary | ICD-10-CM | POA: Diagnosis not present

## 2021-10-19 DIAGNOSIS — M6281 Muscle weakness (generalized): Secondary | ICD-10-CM | POA: Diagnosis not present

## 2021-10-19 DIAGNOSIS — R2689 Other abnormalities of gait and mobility: Secondary | ICD-10-CM | POA: Diagnosis not present

## 2021-11-09 ENCOUNTER — Other Ambulatory Visit: Payer: Self-pay | Admitting: Nurse Practitioner

## 2021-11-09 DIAGNOSIS — F3342 Major depressive disorder, recurrent, in full remission: Secondary | ICD-10-CM

## 2021-11-09 NOTE — Telephone Encounter (Signed)
Correct, patient states that he lives in [place] now.

## 2021-11-09 NOTE — Telephone Encounter (Signed)
I do not think he is still our patient. Please clarify

## 2021-11-09 NOTE — Telephone Encounter (Signed)
Patient medication Zoloft has High Risk Warning with medication Trazodone. Medication pend and sent to PCP Dewaine Oats Carlos American, NP for approval.

## 2021-11-09 NOTE — Telephone Encounter (Signed)
Sherrie Mustache, NP taken off chart as patient PCP.

## 2021-11-19 ENCOUNTER — Other Ambulatory Visit: Payer: Self-pay | Admitting: Nurse Practitioner

## 2021-11-19 DIAGNOSIS — F3342 Major depressive disorder, recurrent, in full remission: Secondary | ICD-10-CM

## 2021-11-22 DIAGNOSIS — I429 Cardiomyopathy, unspecified: Secondary | ICD-10-CM | POA: Diagnosis not present

## 2021-11-22 DIAGNOSIS — E871 Hypo-osmolality and hyponatremia: Secondary | ICD-10-CM | POA: Diagnosis not present

## 2021-11-22 DIAGNOSIS — M7989 Other specified soft tissue disorders: Secondary | ICD-10-CM | POA: Diagnosis not present

## 2021-11-26 ENCOUNTER — Other Ambulatory Visit: Payer: Self-pay | Admitting: Nurse Practitioner

## 2021-11-26 DIAGNOSIS — F3342 Major depressive disorder, recurrent, in full remission: Secondary | ICD-10-CM

## 2021-11-28 ENCOUNTER — Other Ambulatory Visit: Payer: Self-pay | Admitting: Nurse Practitioner

## 2021-11-28 DIAGNOSIS — F3342 Major depressive disorder, recurrent, in full remission: Secondary | ICD-10-CM

## 2021-12-18 DIAGNOSIS — I25118 Atherosclerotic heart disease of native coronary artery with other forms of angina pectoris: Secondary | ICD-10-CM | POA: Diagnosis not present

## 2021-12-18 DIAGNOSIS — M7989 Other specified soft tissue disorders: Secondary | ICD-10-CM | POA: Diagnosis not present

## 2021-12-18 DIAGNOSIS — I10 Essential (primary) hypertension: Secondary | ICD-10-CM | POA: Diagnosis not present

## 2021-12-26 DIAGNOSIS — R278 Other lack of coordination: Secondary | ICD-10-CM | POA: Diagnosis not present

## 2021-12-26 DIAGNOSIS — M6281 Muscle weakness (generalized): Secondary | ICD-10-CM | POA: Diagnosis not present

## 2021-12-28 DIAGNOSIS — M6281 Muscle weakness (generalized): Secondary | ICD-10-CM | POA: Diagnosis not present

## 2021-12-28 DIAGNOSIS — R278 Other lack of coordination: Secondary | ICD-10-CM | POA: Diagnosis not present

## 2021-12-31 DIAGNOSIS — M6281 Muscle weakness (generalized): Secondary | ICD-10-CM | POA: Diagnosis not present

## 2021-12-31 DIAGNOSIS — R278 Other lack of coordination: Secondary | ICD-10-CM | POA: Diagnosis not present

## 2022-01-02 DIAGNOSIS — M6281 Muscle weakness (generalized): Secondary | ICD-10-CM | POA: Diagnosis not present

## 2022-01-02 DIAGNOSIS — R278 Other lack of coordination: Secondary | ICD-10-CM | POA: Diagnosis not present

## 2022-01-07 DIAGNOSIS — M6281 Muscle weakness (generalized): Secondary | ICD-10-CM | POA: Diagnosis not present

## 2022-01-07 DIAGNOSIS — R278 Other lack of coordination: Secondary | ICD-10-CM | POA: Diagnosis not present

## 2022-01-08 DIAGNOSIS — M6281 Muscle weakness (generalized): Secondary | ICD-10-CM | POA: Diagnosis not present

## 2022-01-08 DIAGNOSIS — R278 Other lack of coordination: Secondary | ICD-10-CM | POA: Diagnosis not present

## 2022-01-11 DIAGNOSIS — R278 Other lack of coordination: Secondary | ICD-10-CM | POA: Diagnosis not present

## 2022-01-11 DIAGNOSIS — M6281 Muscle weakness (generalized): Secondary | ICD-10-CM | POA: Diagnosis not present

## 2022-01-17 DIAGNOSIS — M6281 Muscle weakness (generalized): Secondary | ICD-10-CM | POA: Diagnosis not present

## 2022-01-17 DIAGNOSIS — R278 Other lack of coordination: Secondary | ICD-10-CM | POA: Diagnosis not present

## 2022-01-21 DIAGNOSIS — R278 Other lack of coordination: Secondary | ICD-10-CM | POA: Diagnosis not present

## 2022-01-21 DIAGNOSIS — M6281 Muscle weakness (generalized): Secondary | ICD-10-CM | POA: Diagnosis not present

## 2022-01-28 ENCOUNTER — Other Ambulatory Visit: Payer: Self-pay | Admitting: Cardiovascular Disease

## 2022-01-28 DIAGNOSIS — I10 Essential (primary) hypertension: Secondary | ICD-10-CM

## 2022-01-30 DIAGNOSIS — M6281 Muscle weakness (generalized): Secondary | ICD-10-CM | POA: Diagnosis not present

## 2022-01-30 DIAGNOSIS — R278 Other lack of coordination: Secondary | ICD-10-CM | POA: Diagnosis not present

## 2022-01-31 DIAGNOSIS — M6281 Muscle weakness (generalized): Secondary | ICD-10-CM | POA: Diagnosis not present

## 2022-01-31 DIAGNOSIS — R278 Other lack of coordination: Secondary | ICD-10-CM | POA: Diagnosis not present

## 2022-02-05 DIAGNOSIS — R278 Other lack of coordination: Secondary | ICD-10-CM | POA: Diagnosis not present

## 2022-02-05 DIAGNOSIS — M6281 Muscle weakness (generalized): Secondary | ICD-10-CM | POA: Diagnosis not present

## 2022-02-07 DIAGNOSIS — M6281 Muscle weakness (generalized): Secondary | ICD-10-CM | POA: Diagnosis not present

## 2022-02-07 DIAGNOSIS — R278 Other lack of coordination: Secondary | ICD-10-CM | POA: Diagnosis not present

## 2022-02-21 ENCOUNTER — Encounter: Payer: Medicare Other | Admitting: Nurse Practitioner

## 2022-02-21 NOTE — Progress Notes (Signed)
This encounter was created in error - please disregard.

## 2022-02-27 DIAGNOSIS — L570 Actinic keratosis: Secondary | ICD-10-CM | POA: Diagnosis not present

## 2022-03-20 DIAGNOSIS — E039 Hypothyroidism, unspecified: Secondary | ICD-10-CM | POA: Diagnosis not present

## 2022-03-20 DIAGNOSIS — I25118 Atherosclerotic heart disease of native coronary artery with other forms of angina pectoris: Secondary | ICD-10-CM | POA: Diagnosis not present

## 2022-03-20 DIAGNOSIS — R011 Cardiac murmur, unspecified: Secondary | ICD-10-CM | POA: Diagnosis not present

## 2022-03-20 DIAGNOSIS — R0982 Postnasal drip: Secondary | ICD-10-CM | POA: Diagnosis not present

## 2022-03-20 DIAGNOSIS — I10 Essential (primary) hypertension: Secondary | ICD-10-CM | POA: Diagnosis not present

## 2022-03-20 DIAGNOSIS — E785 Hyperlipidemia, unspecified: Secondary | ICD-10-CM | POA: Diagnosis not present

## 2022-03-27 DIAGNOSIS — R011 Cardiac murmur, unspecified: Secondary | ICD-10-CM | POA: Diagnosis not present

## 2022-04-02 DIAGNOSIS — I70219 Atherosclerosis of native arteries of extremities with intermittent claudication, unspecified extremity: Secondary | ICD-10-CM | POA: Diagnosis not present

## 2022-04-02 DIAGNOSIS — I872 Venous insufficiency (chronic) (peripheral): Secondary | ICD-10-CM | POA: Diagnosis not present

## 2022-04-02 DIAGNOSIS — R600 Localized edema: Secondary | ICD-10-CM | POA: Diagnosis not present

## 2022-04-05 ENCOUNTER — Other Ambulatory Visit: Payer: Self-pay | Admitting: Cardiovascular Disease

## 2022-05-17 DIAGNOSIS — M25511 Pain in right shoulder: Secondary | ICD-10-CM | POA: Diagnosis not present

## 2022-05-17 DIAGNOSIS — M6281 Muscle weakness (generalized): Secondary | ICD-10-CM | POA: Diagnosis not present

## 2022-05-20 DIAGNOSIS — M25511 Pain in right shoulder: Secondary | ICD-10-CM | POA: Diagnosis not present

## 2022-05-20 DIAGNOSIS — M6281 Muscle weakness (generalized): Secondary | ICD-10-CM | POA: Diagnosis not present

## 2022-05-22 DIAGNOSIS — M6281 Muscle weakness (generalized): Secondary | ICD-10-CM | POA: Diagnosis not present

## 2022-05-22 DIAGNOSIS — M25511 Pain in right shoulder: Secondary | ICD-10-CM | POA: Diagnosis not present

## 2022-05-26 ENCOUNTER — Other Ambulatory Visit: Payer: Self-pay | Admitting: Nurse Practitioner

## 2022-05-26 DIAGNOSIS — F5104 Psychophysiologic insomnia: Secondary | ICD-10-CM

## 2022-05-27 DIAGNOSIS — M25511 Pain in right shoulder: Secondary | ICD-10-CM | POA: Diagnosis not present

## 2022-05-27 DIAGNOSIS — M6281 Muscle weakness (generalized): Secondary | ICD-10-CM | POA: Diagnosis not present

## 2022-05-27 NOTE — Telephone Encounter (Signed)
No longer our patient

## 2022-05-29 DIAGNOSIS — L570 Actinic keratosis: Secondary | ICD-10-CM | POA: Diagnosis not present

## 2022-05-31 DIAGNOSIS — M25511 Pain in right shoulder: Secondary | ICD-10-CM | POA: Diagnosis not present

## 2022-05-31 DIAGNOSIS — E039 Hypothyroidism, unspecified: Secondary | ICD-10-CM | POA: Diagnosis not present

## 2022-05-31 DIAGNOSIS — M6281 Muscle weakness (generalized): Secondary | ICD-10-CM | POA: Diagnosis not present

## 2022-05-31 DIAGNOSIS — Z79899 Other long term (current) drug therapy: Secondary | ICD-10-CM | POA: Diagnosis not present

## 2022-05-31 DIAGNOSIS — R7989 Other specified abnormal findings of blood chemistry: Secondary | ICD-10-CM | POA: Diagnosis not present

## 2022-05-31 DIAGNOSIS — E785 Hyperlipidemia, unspecified: Secondary | ICD-10-CM | POA: Diagnosis not present

## 2022-06-01 ENCOUNTER — Other Ambulatory Visit: Payer: Self-pay | Admitting: Nurse Practitioner

## 2022-06-01 DIAGNOSIS — F5104 Psychophysiologic insomnia: Secondary | ICD-10-CM

## 2022-06-04 DIAGNOSIS — E871 Hypo-osmolality and hyponatremia: Secondary | ICD-10-CM | POA: Diagnosis not present

## 2022-06-04 DIAGNOSIS — M25511 Pain in right shoulder: Secondary | ICD-10-CM | POA: Diagnosis not present

## 2022-06-04 DIAGNOSIS — R82998 Other abnormal findings in urine: Secondary | ICD-10-CM | POA: Diagnosis not present

## 2022-06-04 DIAGNOSIS — M6281 Muscle weakness (generalized): Secondary | ICD-10-CM | POA: Diagnosis not present

## 2022-06-05 DIAGNOSIS — E871 Hypo-osmolality and hyponatremia: Secondary | ICD-10-CM | POA: Diagnosis not present

## 2022-06-05 DIAGNOSIS — G4700 Insomnia, unspecified: Secondary | ICD-10-CM | POA: Diagnosis not present

## 2022-06-07 DIAGNOSIS — M6281 Muscle weakness (generalized): Secondary | ICD-10-CM | POA: Diagnosis not present

## 2022-06-07 DIAGNOSIS — M25511 Pain in right shoulder: Secondary | ICD-10-CM | POA: Diagnosis not present

## 2022-06-11 DIAGNOSIS — M25511 Pain in right shoulder: Secondary | ICD-10-CM | POA: Diagnosis not present

## 2022-06-11 DIAGNOSIS — M6281 Muscle weakness (generalized): Secondary | ICD-10-CM | POA: Diagnosis not present

## 2022-06-12 DIAGNOSIS — G4700 Insomnia, unspecified: Secondary | ICD-10-CM | POA: Diagnosis not present

## 2022-06-12 DIAGNOSIS — E871 Hypo-osmolality and hyponatremia: Secondary | ICD-10-CM | POA: Diagnosis not present

## 2022-06-14 DIAGNOSIS — M25511 Pain in right shoulder: Secondary | ICD-10-CM | POA: Diagnosis not present

## 2022-06-14 DIAGNOSIS — M6281 Muscle weakness (generalized): Secondary | ICD-10-CM | POA: Diagnosis not present

## 2022-06-17 DIAGNOSIS — M25511 Pain in right shoulder: Secondary | ICD-10-CM | POA: Diagnosis not present

## 2022-06-17 DIAGNOSIS — M6281 Muscle weakness (generalized): Secondary | ICD-10-CM | POA: Diagnosis not present

## 2022-06-18 DIAGNOSIS — M6281 Muscle weakness (generalized): Secondary | ICD-10-CM | POA: Diagnosis not present

## 2022-06-18 DIAGNOSIS — M25511 Pain in right shoulder: Secondary | ICD-10-CM | POA: Diagnosis not present

## 2022-06-24 DIAGNOSIS — M6281 Muscle weakness (generalized): Secondary | ICD-10-CM | POA: Diagnosis not present

## 2022-06-24 DIAGNOSIS — M25511 Pain in right shoulder: Secondary | ICD-10-CM | POA: Diagnosis not present

## 2022-06-25 DIAGNOSIS — M25511 Pain in right shoulder: Secondary | ICD-10-CM | POA: Diagnosis not present

## 2022-06-25 DIAGNOSIS — M6281 Muscle weakness (generalized): Secondary | ICD-10-CM | POA: Diagnosis not present

## 2022-07-04 DIAGNOSIS — L309 Dermatitis, unspecified: Secondary | ICD-10-CM | POA: Diagnosis not present

## 2022-08-06 DIAGNOSIS — I872 Venous insufficiency (chronic) (peripheral): Secondary | ICD-10-CM | POA: Diagnosis not present

## 2022-08-06 DIAGNOSIS — R600 Localized edema: Secondary | ICD-10-CM | POA: Diagnosis not present

## 2022-08-20 DIAGNOSIS — M25511 Pain in right shoulder: Secondary | ICD-10-CM | POA: Diagnosis not present

## 2022-08-20 DIAGNOSIS — M6281 Muscle weakness (generalized): Secondary | ICD-10-CM | POA: Diagnosis not present

## 2022-08-20 DIAGNOSIS — L218 Other seborrheic dermatitis: Secondary | ICD-10-CM | POA: Diagnosis not present

## 2022-08-21 DIAGNOSIS — M25511 Pain in right shoulder: Secondary | ICD-10-CM | POA: Diagnosis not present

## 2022-08-21 DIAGNOSIS — M6281 Muscle weakness (generalized): Secondary | ICD-10-CM | POA: Diagnosis not present

## 2022-08-22 DIAGNOSIS — M6281 Muscle weakness (generalized): Secondary | ICD-10-CM | POA: Diagnosis not present

## 2022-08-22 DIAGNOSIS — M25511 Pain in right shoulder: Secondary | ICD-10-CM | POA: Diagnosis not present

## 2022-08-26 DIAGNOSIS — M25511 Pain in right shoulder: Secondary | ICD-10-CM | POA: Diagnosis not present

## 2022-08-26 DIAGNOSIS — M6281 Muscle weakness (generalized): Secondary | ICD-10-CM | POA: Diagnosis not present

## 2022-08-27 DIAGNOSIS — M6281 Muscle weakness (generalized): Secondary | ICD-10-CM | POA: Diagnosis not present

## 2022-08-27 DIAGNOSIS — M25511 Pain in right shoulder: Secondary | ICD-10-CM | POA: Diagnosis not present

## 2022-09-02 DIAGNOSIS — M25511 Pain in right shoulder: Secondary | ICD-10-CM | POA: Diagnosis not present

## 2022-09-02 DIAGNOSIS — M6281 Muscle weakness (generalized): Secondary | ICD-10-CM | POA: Diagnosis not present

## 2022-09-03 DIAGNOSIS — M25511 Pain in right shoulder: Secondary | ICD-10-CM | POA: Diagnosis not present

## 2022-09-03 DIAGNOSIS — M6281 Muscle weakness (generalized): Secondary | ICD-10-CM | POA: Diagnosis not present

## 2022-09-04 DIAGNOSIS — M25511 Pain in right shoulder: Secondary | ICD-10-CM | POA: Diagnosis not present

## 2022-09-04 DIAGNOSIS — M6281 Muscle weakness (generalized): Secondary | ICD-10-CM | POA: Diagnosis not present

## 2022-09-09 DIAGNOSIS — M25511 Pain in right shoulder: Secondary | ICD-10-CM | POA: Diagnosis not present

## 2022-09-09 DIAGNOSIS — M6281 Muscle weakness (generalized): Secondary | ICD-10-CM | POA: Diagnosis not present

## 2022-09-10 DIAGNOSIS — M25511 Pain in right shoulder: Secondary | ICD-10-CM | POA: Diagnosis not present

## 2022-09-10 DIAGNOSIS — M6281 Muscle weakness (generalized): Secondary | ICD-10-CM | POA: Diagnosis not present

## 2022-09-12 DIAGNOSIS — M6281 Muscle weakness (generalized): Secondary | ICD-10-CM | POA: Diagnosis not present

## 2022-09-12 DIAGNOSIS — M25511 Pain in right shoulder: Secondary | ICD-10-CM | POA: Diagnosis not present

## 2022-09-13 DIAGNOSIS — M6281 Muscle weakness (generalized): Secondary | ICD-10-CM | POA: Diagnosis not present

## 2022-09-13 DIAGNOSIS — M25511 Pain in right shoulder: Secondary | ICD-10-CM | POA: Diagnosis not present

## 2022-09-18 DIAGNOSIS — M6281 Muscle weakness (generalized): Secondary | ICD-10-CM | POA: Diagnosis not present

## 2022-09-18 DIAGNOSIS — M25511 Pain in right shoulder: Secondary | ICD-10-CM | POA: Diagnosis not present

## 2022-09-19 DIAGNOSIS — M25511 Pain in right shoulder: Secondary | ICD-10-CM | POA: Diagnosis not present

## 2022-09-19 DIAGNOSIS — M6281 Muscle weakness (generalized): Secondary | ICD-10-CM | POA: Diagnosis not present

## 2022-09-20 DIAGNOSIS — M25511 Pain in right shoulder: Secondary | ICD-10-CM | POA: Diagnosis not present

## 2022-09-20 DIAGNOSIS — M6281 Muscle weakness (generalized): Secondary | ICD-10-CM | POA: Diagnosis not present

## 2022-09-23 DIAGNOSIS — M25511 Pain in right shoulder: Secondary | ICD-10-CM | POA: Diagnosis not present

## 2022-09-23 DIAGNOSIS — M6281 Muscle weakness (generalized): Secondary | ICD-10-CM | POA: Diagnosis not present

## 2022-09-24 DIAGNOSIS — M6281 Muscle weakness (generalized): Secondary | ICD-10-CM | POA: Diagnosis not present

## 2022-09-24 DIAGNOSIS — M25511 Pain in right shoulder: Secondary | ICD-10-CM | POA: Diagnosis not present

## 2022-10-01 DIAGNOSIS — M25462 Effusion, left knee: Secondary | ICD-10-CM | POA: Diagnosis not present

## 2022-10-01 DIAGNOSIS — M25461 Effusion, right knee: Secondary | ICD-10-CM | POA: Diagnosis not present

## 2022-10-01 DIAGNOSIS — S069X9A Unspecified intracranial injury with loss of consciousness of unspecified duration, initial encounter: Secondary | ICD-10-CM | POA: Diagnosis not present

## 2022-10-01 DIAGNOSIS — M6281 Muscle weakness (generalized): Secondary | ICD-10-CM | POA: Diagnosis not present

## 2022-10-01 DIAGNOSIS — R110 Nausea: Secondary | ICD-10-CM | POA: Diagnosis not present

## 2022-10-01 DIAGNOSIS — S0003XA Contusion of scalp, initial encounter: Secondary | ICD-10-CM | POA: Diagnosis not present

## 2022-10-01 DIAGNOSIS — M160 Bilateral primary osteoarthritis of hip: Secondary | ICD-10-CM | POA: Diagnosis not present

## 2022-10-01 DIAGNOSIS — M25551 Pain in right hip: Secondary | ICD-10-CM | POA: Diagnosis not present

## 2022-10-01 DIAGNOSIS — M25561 Pain in right knee: Secondary | ICD-10-CM | POA: Diagnosis not present

## 2022-10-01 DIAGNOSIS — M25511 Pain in right shoulder: Secondary | ICD-10-CM | POA: Diagnosis not present

## 2022-10-01 DIAGNOSIS — M25552 Pain in left hip: Secondary | ICD-10-CM | POA: Diagnosis not present

## 2022-10-01 DIAGNOSIS — Z7982 Long term (current) use of aspirin: Secondary | ICD-10-CM | POA: Diagnosis not present

## 2022-10-01 DIAGNOSIS — I10 Essential (primary) hypertension: Secondary | ICD-10-CM | POA: Diagnosis not present

## 2022-10-01 DIAGNOSIS — S0990XA Unspecified injury of head, initial encounter: Secondary | ICD-10-CM | POA: Diagnosis not present

## 2022-10-01 DIAGNOSIS — M25562 Pain in left knee: Secondary | ICD-10-CM | POA: Diagnosis not present

## 2022-10-03 DIAGNOSIS — M25511 Pain in right shoulder: Secondary | ICD-10-CM | POA: Diagnosis not present

## 2022-10-03 DIAGNOSIS — M6281 Muscle weakness (generalized): Secondary | ICD-10-CM | POA: Diagnosis not present

## 2022-10-07 DIAGNOSIS — M25511 Pain in right shoulder: Secondary | ICD-10-CM | POA: Diagnosis not present

## 2022-10-07 DIAGNOSIS — M6281 Muscle weakness (generalized): Secondary | ICD-10-CM | POA: Diagnosis not present

## 2022-10-09 DIAGNOSIS — L578 Other skin changes due to chronic exposure to nonionizing radiation: Secondary | ICD-10-CM | POA: Diagnosis not present

## 2022-10-09 DIAGNOSIS — Z1283 Encounter for screening for malignant neoplasm of skin: Secondary | ICD-10-CM | POA: Diagnosis not present

## 2022-10-09 DIAGNOSIS — D1801 Hemangioma of skin and subcutaneous tissue: Secondary | ICD-10-CM | POA: Diagnosis not present

## 2022-10-09 DIAGNOSIS — L821 Other seborrheic keratosis: Secondary | ICD-10-CM | POA: Diagnosis not present

## 2022-10-09 DIAGNOSIS — D485 Neoplasm of uncertain behavior of skin: Secondary | ICD-10-CM | POA: Diagnosis not present

## 2022-10-10 DIAGNOSIS — M25511 Pain in right shoulder: Secondary | ICD-10-CM | POA: Diagnosis not present

## 2022-10-10 DIAGNOSIS — M6281 Muscle weakness (generalized): Secondary | ICD-10-CM | POA: Diagnosis not present

## 2022-10-21 DIAGNOSIS — M25562 Pain in left knee: Secondary | ICD-10-CM | POA: Diagnosis not present

## 2022-10-21 DIAGNOSIS — R2681 Unsteadiness on feet: Secondary | ICD-10-CM | POA: Diagnosis not present

## 2022-10-21 DIAGNOSIS — M62552 Muscle wasting and atrophy, not elsewhere classified, left thigh: Secondary | ICD-10-CM | POA: Diagnosis not present

## 2022-10-21 DIAGNOSIS — M62551 Muscle wasting and atrophy, not elsewhere classified, right thigh: Secondary | ICD-10-CM | POA: Diagnosis not present

## 2022-10-21 DIAGNOSIS — R2689 Other abnormalities of gait and mobility: Secondary | ICD-10-CM | POA: Diagnosis not present

## 2022-10-23 DIAGNOSIS — M62552 Muscle wasting and atrophy, not elsewhere classified, left thigh: Secondary | ICD-10-CM | POA: Diagnosis not present

## 2022-10-23 DIAGNOSIS — R2681 Unsteadiness on feet: Secondary | ICD-10-CM | POA: Diagnosis not present

## 2022-10-23 DIAGNOSIS — M62551 Muscle wasting and atrophy, not elsewhere classified, right thigh: Secondary | ICD-10-CM | POA: Diagnosis not present

## 2022-10-23 DIAGNOSIS — R2689 Other abnormalities of gait and mobility: Secondary | ICD-10-CM | POA: Diagnosis not present

## 2022-10-23 DIAGNOSIS — M25562 Pain in left knee: Secondary | ICD-10-CM | POA: Diagnosis not present

## 2022-10-28 DIAGNOSIS — R2689 Other abnormalities of gait and mobility: Secondary | ICD-10-CM | POA: Diagnosis not present

## 2022-10-28 DIAGNOSIS — M62552 Muscle wasting and atrophy, not elsewhere classified, left thigh: Secondary | ICD-10-CM | POA: Diagnosis not present

## 2022-10-28 DIAGNOSIS — M62551 Muscle wasting and atrophy, not elsewhere classified, right thigh: Secondary | ICD-10-CM | POA: Diagnosis not present

## 2022-10-28 DIAGNOSIS — R2681 Unsteadiness on feet: Secondary | ICD-10-CM | POA: Diagnosis not present

## 2022-10-28 DIAGNOSIS — M25562 Pain in left knee: Secondary | ICD-10-CM | POA: Diagnosis not present

## 2022-10-30 DIAGNOSIS — R2689 Other abnormalities of gait and mobility: Secondary | ICD-10-CM | POA: Diagnosis not present

## 2022-10-30 DIAGNOSIS — M62552 Muscle wasting and atrophy, not elsewhere classified, left thigh: Secondary | ICD-10-CM | POA: Diagnosis not present

## 2022-10-30 DIAGNOSIS — M62551 Muscle wasting and atrophy, not elsewhere classified, right thigh: Secondary | ICD-10-CM | POA: Diagnosis not present

## 2022-10-30 DIAGNOSIS — R2681 Unsteadiness on feet: Secondary | ICD-10-CM | POA: Diagnosis not present

## 2022-10-30 DIAGNOSIS — M25562 Pain in left knee: Secondary | ICD-10-CM | POA: Diagnosis not present

## 2022-11-04 DIAGNOSIS — M62552 Muscle wasting and atrophy, not elsewhere classified, left thigh: Secondary | ICD-10-CM | POA: Diagnosis not present

## 2022-11-04 DIAGNOSIS — R2689 Other abnormalities of gait and mobility: Secondary | ICD-10-CM | POA: Diagnosis not present

## 2022-11-04 DIAGNOSIS — M25562 Pain in left knee: Secondary | ICD-10-CM | POA: Diagnosis not present

## 2022-11-04 DIAGNOSIS — R2681 Unsteadiness on feet: Secondary | ICD-10-CM | POA: Diagnosis not present

## 2022-11-04 DIAGNOSIS — M62551 Muscle wasting and atrophy, not elsewhere classified, right thigh: Secondary | ICD-10-CM | POA: Diagnosis not present

## 2022-11-06 DIAGNOSIS — M62551 Muscle wasting and atrophy, not elsewhere classified, right thigh: Secondary | ICD-10-CM | POA: Diagnosis not present

## 2022-11-06 DIAGNOSIS — R2689 Other abnormalities of gait and mobility: Secondary | ICD-10-CM | POA: Diagnosis not present

## 2022-11-06 DIAGNOSIS — M25562 Pain in left knee: Secondary | ICD-10-CM | POA: Diagnosis not present

## 2022-11-06 DIAGNOSIS — R2681 Unsteadiness on feet: Secondary | ICD-10-CM | POA: Diagnosis not present

## 2022-11-06 DIAGNOSIS — M62552 Muscle wasting and atrophy, not elsewhere classified, left thigh: Secondary | ICD-10-CM | POA: Diagnosis not present

## 2022-11-11 DIAGNOSIS — M62552 Muscle wasting and atrophy, not elsewhere classified, left thigh: Secondary | ICD-10-CM | POA: Diagnosis not present

## 2022-11-11 DIAGNOSIS — M25562 Pain in left knee: Secondary | ICD-10-CM | POA: Diagnosis not present

## 2022-11-11 DIAGNOSIS — R2689 Other abnormalities of gait and mobility: Secondary | ICD-10-CM | POA: Diagnosis not present

## 2022-11-11 DIAGNOSIS — R2681 Unsteadiness on feet: Secondary | ICD-10-CM | POA: Diagnosis not present

## 2022-11-11 DIAGNOSIS — M62551 Muscle wasting and atrophy, not elsewhere classified, right thigh: Secondary | ICD-10-CM | POA: Diagnosis not present

## 2022-11-13 DIAGNOSIS — M25562 Pain in left knee: Secondary | ICD-10-CM | POA: Diagnosis not present

## 2022-11-13 DIAGNOSIS — M62552 Muscle wasting and atrophy, not elsewhere classified, left thigh: Secondary | ICD-10-CM | POA: Diagnosis not present

## 2022-11-13 DIAGNOSIS — H26492 Other secondary cataract, left eye: Secondary | ICD-10-CM | POA: Diagnosis not present

## 2022-11-13 DIAGNOSIS — R2689 Other abnormalities of gait and mobility: Secondary | ICD-10-CM | POA: Diagnosis not present

## 2022-11-13 DIAGNOSIS — M62551 Muscle wasting and atrophy, not elsewhere classified, right thigh: Secondary | ICD-10-CM | POA: Diagnosis not present

## 2022-11-13 DIAGNOSIS — R2681 Unsteadiness on feet: Secondary | ICD-10-CM | POA: Diagnosis not present

## 2022-11-13 DIAGNOSIS — H40013 Open angle with borderline findings, low risk, bilateral: Secondary | ICD-10-CM | POA: Diagnosis not present

## 2022-11-13 DIAGNOSIS — H524 Presbyopia: Secondary | ICD-10-CM | POA: Diagnosis not present

## 2022-11-18 DIAGNOSIS — M62552 Muscle wasting and atrophy, not elsewhere classified, left thigh: Secondary | ICD-10-CM | POA: Diagnosis not present

## 2022-11-18 DIAGNOSIS — M25562 Pain in left knee: Secondary | ICD-10-CM | POA: Diagnosis not present

## 2022-11-18 DIAGNOSIS — R2681 Unsteadiness on feet: Secondary | ICD-10-CM | POA: Diagnosis not present

## 2022-11-18 DIAGNOSIS — R2689 Other abnormalities of gait and mobility: Secondary | ICD-10-CM | POA: Diagnosis not present

## 2022-11-18 DIAGNOSIS — M62551 Muscle wasting and atrophy, not elsewhere classified, right thigh: Secondary | ICD-10-CM | POA: Diagnosis not present

## 2022-11-20 DIAGNOSIS — R2681 Unsteadiness on feet: Secondary | ICD-10-CM | POA: Diagnosis not present

## 2022-11-20 DIAGNOSIS — M62551 Muscle wasting and atrophy, not elsewhere classified, right thigh: Secondary | ICD-10-CM | POA: Diagnosis not present

## 2022-11-20 DIAGNOSIS — M62552 Muscle wasting and atrophy, not elsewhere classified, left thigh: Secondary | ICD-10-CM | POA: Diagnosis not present

## 2022-11-20 DIAGNOSIS — R2689 Other abnormalities of gait and mobility: Secondary | ICD-10-CM | POA: Diagnosis not present

## 2022-11-20 DIAGNOSIS — M25562 Pain in left knee: Secondary | ICD-10-CM | POA: Diagnosis not present

## 2022-11-25 DIAGNOSIS — M62551 Muscle wasting and atrophy, not elsewhere classified, right thigh: Secondary | ICD-10-CM | POA: Diagnosis not present

## 2022-11-25 DIAGNOSIS — R2681 Unsteadiness on feet: Secondary | ICD-10-CM | POA: Diagnosis not present

## 2022-11-25 DIAGNOSIS — M62552 Muscle wasting and atrophy, not elsewhere classified, left thigh: Secondary | ICD-10-CM | POA: Diagnosis not present

## 2022-11-25 DIAGNOSIS — R2689 Other abnormalities of gait and mobility: Secondary | ICD-10-CM | POA: Diagnosis not present

## 2022-11-25 DIAGNOSIS — M25562 Pain in left knee: Secondary | ICD-10-CM | POA: Diagnosis not present

## 2022-11-27 DIAGNOSIS — M62551 Muscle wasting and atrophy, not elsewhere classified, right thigh: Secondary | ICD-10-CM | POA: Diagnosis not present

## 2022-11-27 DIAGNOSIS — R2681 Unsteadiness on feet: Secondary | ICD-10-CM | POA: Diagnosis not present

## 2022-11-27 DIAGNOSIS — M25562 Pain in left knee: Secondary | ICD-10-CM | POA: Diagnosis not present

## 2022-11-27 DIAGNOSIS — R2689 Other abnormalities of gait and mobility: Secondary | ICD-10-CM | POA: Diagnosis not present

## 2022-11-27 DIAGNOSIS — M62552 Muscle wasting and atrophy, not elsewhere classified, left thigh: Secondary | ICD-10-CM | POA: Diagnosis not present

## 2022-12-05 ENCOUNTER — Other Ambulatory Visit: Payer: Self-pay | Admitting: Cardiovascular Disease

## 2022-12-17 DIAGNOSIS — E785 Hyperlipidemia, unspecified: Secondary | ICD-10-CM | POA: Diagnosis not present

## 2022-12-17 DIAGNOSIS — F03B0 Unspecified dementia, moderate, without behavioral disturbance, psychotic disturbance, mood disturbance, and anxiety: Secondary | ICD-10-CM | POA: Diagnosis not present

## 2022-12-17 DIAGNOSIS — I25118 Atherosclerotic heart disease of native coronary artery with other forms of angina pectoris: Secondary | ICD-10-CM | POA: Diagnosis not present

## 2022-12-17 DIAGNOSIS — Z23 Encounter for immunization: Secondary | ICD-10-CM | POA: Diagnosis not present

## 2022-12-17 DIAGNOSIS — Z1389 Encounter for screening for other disorder: Secondary | ICD-10-CM | POA: Diagnosis not present

## 2022-12-17 DIAGNOSIS — I739 Peripheral vascular disease, unspecified: Secondary | ICD-10-CM | POA: Diagnosis not present

## 2022-12-17 DIAGNOSIS — E039 Hypothyroidism, unspecified: Secondary | ICD-10-CM | POA: Diagnosis not present

## 2022-12-17 DIAGNOSIS — Z0000 Encounter for general adult medical examination without abnormal findings: Secondary | ICD-10-CM | POA: Diagnosis not present

## 2023-01-08 DIAGNOSIS — H3581 Retinal edema: Secondary | ICD-10-CM | POA: Diagnosis not present

## 2023-01-10 DIAGNOSIS — H43813 Vitreous degeneration, bilateral: Secondary | ICD-10-CM | POA: Diagnosis not present

## 2023-01-10 DIAGNOSIS — H3500 Unspecified background retinopathy: Secondary | ICD-10-CM | POA: Diagnosis not present

## 2023-01-10 DIAGNOSIS — H35013 Changes in retinal vascular appearance, bilateral: Secondary | ICD-10-CM | POA: Diagnosis not present

## 2023-01-20 DIAGNOSIS — S82035A Nondisplaced transverse fracture of left patella, initial encounter for closed fracture: Secondary | ICD-10-CM | POA: Diagnosis not present

## 2023-01-20 DIAGNOSIS — S82092A Other fracture of left patella, initial encounter for closed fracture: Secondary | ICD-10-CM | POA: Diagnosis not present

## 2023-01-22 DIAGNOSIS — M25562 Pain in left knee: Secondary | ICD-10-CM | POA: Diagnosis not present

## 2023-01-22 DIAGNOSIS — S82035A Nondisplaced transverse fracture of left patella, initial encounter for closed fracture: Secondary | ICD-10-CM | POA: Diagnosis not present

## 2023-01-24 DIAGNOSIS — R296 Repeated falls: Secondary | ICD-10-CM | POA: Diagnosis not present

## 2023-01-24 DIAGNOSIS — M222X2 Patellofemoral disorders, left knee: Secondary | ICD-10-CM | POA: Diagnosis not present

## 2023-01-24 DIAGNOSIS — R488 Other symbolic dysfunctions: Secondary | ICD-10-CM | POA: Diagnosis not present

## 2023-01-28 DIAGNOSIS — R296 Repeated falls: Secondary | ICD-10-CM | POA: Diagnosis not present

## 2023-01-28 DIAGNOSIS — R488 Other symbolic dysfunctions: Secondary | ICD-10-CM | POA: Diagnosis not present

## 2023-01-28 DIAGNOSIS — M222X2 Patellofemoral disorders, left knee: Secondary | ICD-10-CM | POA: Diagnosis not present

## 2023-01-29 DIAGNOSIS — H35042 Retinal micro-aneurysms, unspecified, left eye: Secondary | ICD-10-CM | POA: Diagnosis not present

## 2023-01-29 DIAGNOSIS — H35013 Changes in retinal vascular appearance, bilateral: Secondary | ICD-10-CM | POA: Diagnosis not present

## 2023-01-29 DIAGNOSIS — H43813 Vitreous degeneration, bilateral: Secondary | ICD-10-CM | POA: Diagnosis not present

## 2023-01-29 DIAGNOSIS — H4312 Vitreous hemorrhage, left eye: Secondary | ICD-10-CM | POA: Diagnosis not present

## 2023-01-30 DIAGNOSIS — R296 Repeated falls: Secondary | ICD-10-CM | POA: Diagnosis not present

## 2023-01-30 DIAGNOSIS — M222X2 Patellofemoral disorders, left knee: Secondary | ICD-10-CM | POA: Diagnosis not present

## 2023-01-30 DIAGNOSIS — R488 Other symbolic dysfunctions: Secondary | ICD-10-CM | POA: Diagnosis not present

## 2023-01-31 DIAGNOSIS — M222X2 Patellofemoral disorders, left knee: Secondary | ICD-10-CM | POA: Diagnosis not present

## 2023-01-31 DIAGNOSIS — R296 Repeated falls: Secondary | ICD-10-CM | POA: Diagnosis not present

## 2023-01-31 DIAGNOSIS — R488 Other symbolic dysfunctions: Secondary | ICD-10-CM | POA: Diagnosis not present

## 2023-02-04 DIAGNOSIS — R296 Repeated falls: Secondary | ICD-10-CM | POA: Diagnosis not present

## 2023-02-04 DIAGNOSIS — M222X2 Patellofemoral disorders, left knee: Secondary | ICD-10-CM | POA: Diagnosis not present

## 2023-02-04 DIAGNOSIS — R488 Other symbolic dysfunctions: Secondary | ICD-10-CM | POA: Diagnosis not present

## 2023-02-05 DIAGNOSIS — M222X2 Patellofemoral disorders, left knee: Secondary | ICD-10-CM | POA: Diagnosis not present

## 2023-02-05 DIAGNOSIS — R296 Repeated falls: Secondary | ICD-10-CM | POA: Diagnosis not present

## 2023-02-05 DIAGNOSIS — S82035A Nondisplaced transverse fracture of left patella, initial encounter for closed fracture: Secondary | ICD-10-CM | POA: Diagnosis not present

## 2023-02-05 DIAGNOSIS — R488 Other symbolic dysfunctions: Secondary | ICD-10-CM | POA: Diagnosis not present

## 2023-02-07 DIAGNOSIS — R488 Other symbolic dysfunctions: Secondary | ICD-10-CM | POA: Diagnosis not present

## 2023-02-07 DIAGNOSIS — M222X2 Patellofemoral disorders, left knee: Secondary | ICD-10-CM | POA: Diagnosis not present

## 2023-02-07 DIAGNOSIS — R296 Repeated falls: Secondary | ICD-10-CM | POA: Diagnosis not present

## 2023-02-12 DIAGNOSIS — H35042 Retinal micro-aneurysms, unspecified, left eye: Secondary | ICD-10-CM | POA: Diagnosis not present

## 2023-02-12 DIAGNOSIS — H4312 Vitreous hemorrhage, left eye: Secondary | ICD-10-CM | POA: Diagnosis not present

## 2023-02-12 DIAGNOSIS — H43813 Vitreous degeneration, bilateral: Secondary | ICD-10-CM | POA: Diagnosis not present

## 2023-02-12 DIAGNOSIS — H35013 Changes in retinal vascular appearance, bilateral: Secondary | ICD-10-CM | POA: Diagnosis not present

## 2023-02-13 DIAGNOSIS — M222X2 Patellofemoral disorders, left knee: Secondary | ICD-10-CM | POA: Diagnosis not present

## 2023-02-13 DIAGNOSIS — R488 Other symbolic dysfunctions: Secondary | ICD-10-CM | POA: Diagnosis not present

## 2023-02-13 DIAGNOSIS — R296 Repeated falls: Secondary | ICD-10-CM | POA: Diagnosis not present

## 2023-02-17 DIAGNOSIS — R296 Repeated falls: Secondary | ICD-10-CM | POA: Diagnosis not present

## 2023-02-17 DIAGNOSIS — R488 Other symbolic dysfunctions: Secondary | ICD-10-CM | POA: Diagnosis not present

## 2023-02-17 DIAGNOSIS — M222X2 Patellofemoral disorders, left knee: Secondary | ICD-10-CM | POA: Diagnosis not present

## 2023-02-19 DIAGNOSIS — R488 Other symbolic dysfunctions: Secondary | ICD-10-CM | POA: Diagnosis not present

## 2023-02-19 DIAGNOSIS — R296 Repeated falls: Secondary | ICD-10-CM | POA: Diagnosis not present

## 2023-02-19 DIAGNOSIS — M222X2 Patellofemoral disorders, left knee: Secondary | ICD-10-CM | POA: Diagnosis not present

## 2023-02-24 DIAGNOSIS — R296 Repeated falls: Secondary | ICD-10-CM | POA: Diagnosis not present

## 2023-02-24 DIAGNOSIS — R488 Other symbolic dysfunctions: Secondary | ICD-10-CM | POA: Diagnosis not present

## 2023-02-24 DIAGNOSIS — M222X2 Patellofemoral disorders, left knee: Secondary | ICD-10-CM | POA: Diagnosis not present

## 2023-02-27 DIAGNOSIS — R488 Other symbolic dysfunctions: Secondary | ICD-10-CM | POA: Diagnosis not present

## 2023-02-27 DIAGNOSIS — M222X2 Patellofemoral disorders, left knee: Secondary | ICD-10-CM | POA: Diagnosis not present

## 2023-02-27 DIAGNOSIS — R296 Repeated falls: Secondary | ICD-10-CM | POA: Diagnosis not present

## 2023-03-05 DIAGNOSIS — R296 Repeated falls: Secondary | ICD-10-CM | POA: Diagnosis not present

## 2023-03-05 DIAGNOSIS — M222X2 Patellofemoral disorders, left knee: Secondary | ICD-10-CM | POA: Diagnosis not present

## 2023-03-05 DIAGNOSIS — R488 Other symbolic dysfunctions: Secondary | ICD-10-CM | POA: Diagnosis not present

## 2023-03-06 DIAGNOSIS — R296 Repeated falls: Secondary | ICD-10-CM | POA: Diagnosis not present

## 2023-03-06 DIAGNOSIS — R488 Other symbolic dysfunctions: Secondary | ICD-10-CM | POA: Diagnosis not present

## 2023-03-06 DIAGNOSIS — M222X2 Patellofemoral disorders, left knee: Secondary | ICD-10-CM | POA: Diagnosis not present

## 2023-03-11 DIAGNOSIS — S82035A Nondisplaced transverse fracture of left patella, initial encounter for closed fracture: Secondary | ICD-10-CM | POA: Diagnosis not present

## 2023-03-12 DIAGNOSIS — R2689 Other abnormalities of gait and mobility: Secondary | ICD-10-CM | POA: Diagnosis not present

## 2023-03-12 DIAGNOSIS — R2681 Unsteadiness on feet: Secondary | ICD-10-CM | POA: Diagnosis not present

## 2023-03-12 DIAGNOSIS — M62551 Muscle wasting and atrophy, not elsewhere classified, right thigh: Secondary | ICD-10-CM | POA: Diagnosis not present

## 2023-03-12 DIAGNOSIS — M6259 Muscle wasting and atrophy, not elsewhere classified, multiple sites: Secondary | ICD-10-CM | POA: Diagnosis not present

## 2023-03-19 DIAGNOSIS — R2681 Unsteadiness on feet: Secondary | ICD-10-CM | POA: Diagnosis not present

## 2023-03-19 DIAGNOSIS — R2689 Other abnormalities of gait and mobility: Secondary | ICD-10-CM | POA: Diagnosis not present

## 2023-03-19 DIAGNOSIS — M62551 Muscle wasting and atrophy, not elsewhere classified, right thigh: Secondary | ICD-10-CM | POA: Diagnosis not present

## 2023-03-19 DIAGNOSIS — M6259 Muscle wasting and atrophy, not elsewhere classified, multiple sites: Secondary | ICD-10-CM | POA: Diagnosis not present

## 2023-06-03 ENCOUNTER — Other Ambulatory Visit: Payer: Self-pay | Admitting: Cardiovascular Disease
